# Patient Record
Sex: Female | Born: 1950 | Race: White | Hispanic: No | Smoking: Never smoker
Health system: Southern US, Community
[De-identification: ages and names within clinical notes are randomized; demographics above are authoritative.]

## PROBLEM LIST (undated history)

## (undated) DIAGNOSIS — D61810 Antineoplastic chemotherapy induced pancytopenia: Secondary | ICD-10-CM

## (undated) DIAGNOSIS — I10 Essential (primary) hypertension: Secondary | ICD-10-CM

## (undated) DIAGNOSIS — C50919 Malignant neoplasm of unspecified site of unspecified female breast: Secondary | ICD-10-CM

## (undated) DIAGNOSIS — F32A Depression, unspecified: Secondary | ICD-10-CM

## (undated) DIAGNOSIS — C801 Malignant (primary) neoplasm, unspecified: Secondary | ICD-10-CM

## (undated) DIAGNOSIS — M1990 Unspecified osteoarthritis, unspecified site: Secondary | ICD-10-CM

## (undated) DIAGNOSIS — F419 Anxiety disorder, unspecified: Secondary | ICD-10-CM

## (undated) DIAGNOSIS — I890 Lymphedema, not elsewhere classified: Secondary | ICD-10-CM

## (undated) DIAGNOSIS — T451X5A Adverse effect of antineoplastic and immunosuppressive drugs, initial encounter: Secondary | ICD-10-CM

## (undated) DIAGNOSIS — T7840XA Allergy, unspecified, initial encounter: Secondary | ICD-10-CM

## (undated) DIAGNOSIS — N6320 Unspecified lump in the left breast, unspecified quadrant: Secondary | ICD-10-CM

## (undated) DIAGNOSIS — F329 Major depressive disorder, single episode, unspecified: Secondary | ICD-10-CM

## (undated) DIAGNOSIS — E7800 Pure hypercholesterolemia, unspecified: Secondary | ICD-10-CM

## (undated) DIAGNOSIS — N6310 Unspecified lump in the right breast, unspecified quadrant: Secondary | ICD-10-CM

## (undated) HISTORY — DX: Malignant neoplasm of unspecified site of unspecified female breast: C50.919

## (undated) HISTORY — PX: TUBAL LIGATION: SHX77

## (undated) HISTORY — DX: Allergy, unspecified, initial encounter: T78.40XA

## (undated) HISTORY — DX: Unspecified osteoarthritis, unspecified site: M19.90

## (undated) HISTORY — DX: Anxiety disorder, unspecified: F41.9

## (undated) HISTORY — PX: TONSILLECTOMY: SUR1361

## (undated) HISTORY — PX: CHOLECYSTECTOMY: SHX55

---

## 2005-09-03 ENCOUNTER — Encounter: Payer: Self-pay | Admitting: Emergency Medicine

## 2005-09-03 ENCOUNTER — Emergency Department (HOSPITAL_COMMUNITY): Admission: EM | Admit: 2005-09-03 | Discharge: 2005-09-04 | Payer: Self-pay | Admitting: Emergency Medicine

## 2012-09-13 DIAGNOSIS — N6320 Unspecified lump in the left breast, unspecified quadrant: Secondary | ICD-10-CM

## 2012-09-13 DIAGNOSIS — N6310 Unspecified lump in the right breast, unspecified quadrant: Secondary | ICD-10-CM

## 2012-09-13 HISTORY — DX: Unspecified lump in the right breast, unspecified quadrant: N63.10

## 2012-09-13 HISTORY — DX: Unspecified lump in the left breast, unspecified quadrant: N63.20

## 2012-10-01 ENCOUNTER — Inpatient Hospital Stay (HOSPITAL_COMMUNITY)
Admission: EM | Admit: 2012-10-01 | Discharge: 2012-10-04 | DRG: 585 | Disposition: A | Discharge status: Home Health | Payer: Medicaid Other | Attending: General Surgery | Admitting: General Surgery

## 2012-10-01 ENCOUNTER — Encounter (HOSPITAL_COMMUNITY): Payer: Self-pay | Admitting: *Deleted

## 2012-10-01 DIAGNOSIS — Z23 Encounter for immunization: Secondary | ICD-10-CM

## 2012-10-01 DIAGNOSIS — N63 Unspecified lump in unspecified breast: Secondary | ICD-10-CM

## 2012-10-01 DIAGNOSIS — N289 Disorder of kidney and ureter, unspecified: Secondary | ICD-10-CM | POA: Diagnosis present

## 2012-10-01 DIAGNOSIS — L0390 Cellulitis, unspecified: Secondary | ICD-10-CM

## 2012-10-01 DIAGNOSIS — C50919 Malignant neoplasm of unspecified site of unspecified female breast: Principal | ICD-10-CM | POA: Diagnosis present

## 2012-10-01 DIAGNOSIS — N611 Abscess of the breast and nipple: Secondary | ICD-10-CM

## 2012-10-01 DIAGNOSIS — F329 Major depressive disorder, single episode, unspecified: Secondary | ICD-10-CM | POA: Diagnosis present

## 2012-10-01 DIAGNOSIS — R110 Nausea: Secondary | ICD-10-CM | POA: Diagnosis not present

## 2012-10-01 DIAGNOSIS — E785 Hyperlipidemia, unspecified: Secondary | ICD-10-CM | POA: Diagnosis present

## 2012-10-01 DIAGNOSIS — I10 Essential (primary) hypertension: Secondary | ICD-10-CM | POA: Diagnosis present

## 2012-10-01 DIAGNOSIS — F3289 Other specified depressive episodes: Secondary | ICD-10-CM | POA: Diagnosis present

## 2012-10-01 DIAGNOSIS — D473 Essential (hemorrhagic) thrombocythemia: Secondary | ICD-10-CM | POA: Diagnosis present

## 2012-10-01 DIAGNOSIS — D630 Anemia in neoplastic disease: Secondary | ICD-10-CM | POA: Diagnosis present

## 2012-10-01 HISTORY — DX: Essential (primary) hypertension: I10

## 2012-10-01 HISTORY — DX: Malignant (primary) neoplasm, unspecified: C80.1

## 2012-10-01 HISTORY — DX: Pure hypercholesterolemia, unspecified: E78.00

## 2012-10-01 LAB — BASIC METABOLIC PANEL
BUN: 9 mg/dL (ref 6–23)
CO2: 27 mEq/L (ref 19–32)
GFR calc non Af Amer: 47 mL/min — ABNORMAL LOW (ref >90)
Glucose, Bld: 107 mg/dL — ABNORMAL HIGH (ref 70–99)
Potassium: 3.1 mEq/L — ABNORMAL LOW (ref 3.5–5.1)

## 2012-10-01 LAB — CBC WITH DIFFERENTIAL/PLATELET
Eosinophils Absolute: 0.1 10*3/uL (ref 0.0–0.7)
Eosinophils Relative: 1 % (ref 0–5)
Hemoglobin: 11.8 g/dL — ABNORMAL LOW (ref 12.0–15.0)
Lymphocytes Relative: 11 % — ABNORMAL LOW (ref 12–46)
Lymphs Abs: 1.6 10*3/uL (ref 0.7–4.0)
MCH: 29.2 pg (ref 26.0–34.0)
MCV: 86.9 fL (ref 78.0–100.0)
Monocytes Relative: 8 % (ref 3–12)
Neutrophils Relative %: 80 % — ABNORMAL HIGH (ref 43–77)
RBC: 4.04 MIL/uL (ref 3.87–5.11)

## 2012-10-01 MED ORDER — LACTATED RINGERS IV SOLN
INTRAVENOUS | Status: DC
  Administered 2012-10-01: 16:00:00 via INTRAVENOUS

## 2012-10-01 MED ORDER — PIPERACILLIN-TAZOBACTAM 3.375 G IVPB 30 MIN: 3.3750 g | Freq: Once | INTRAVENOUS | Status: AC

## 2012-10-01 MED ORDER — HYDROCODONE-ACETAMINOPHEN 5-325 MG PO TABS
1.0000 | ORAL_TABLET | ORAL | Status: DC | PRN
  Administered 2012-10-01: 2 via ORAL
  Administered 2012-10-02 – 2012-10-03 (×2): 1 via ORAL
  Administered 2012-10-04 (×2): 2 via ORAL
  Filled 2012-10-01 (×2): qty 2
  Filled 2012-10-01: qty 1
  Filled 2012-10-01: qty 2
  Filled 2012-10-01: qty 1

## 2012-10-01 MED ORDER — INFLUENZA VIRUS VACC SPLIT PF IM SUSP
0.5000 mL | INTRAMUSCULAR | Status: AC
  Administered 2012-10-02: 0.5 mL via INTRAMUSCULAR
  Filled 2012-10-01: qty 0.5

## 2012-10-01 MED ORDER — PIPERACILLIN-TAZOBACTAM 3.375 G IVPB
3.3750 g | Freq: Three times a day (TID) | INTRAVENOUS | Status: DC
  Administered 2012-10-01 – 2012-10-04 (×9): 3.375 g via INTRAVENOUS
  Filled 2012-10-01 (×13): qty 50

## 2012-10-01 MED ORDER — PANTOPRAZOLE SODIUM 40 MG IV SOLR
40.0000 mg | Freq: Every day | INTRAVENOUS | Status: DC
  Administered 2012-10-01 – 2012-10-02 (×2): 40 mg via INTRAVENOUS
  Filled 2012-10-01 (×2): qty 40

## 2012-10-01 MED ORDER — ENOXAPARIN SODIUM 40 MG/0.4ML ~~LOC~~ SOLN
40.0000 mg | Freq: Every day | SUBCUTANEOUS | Status: DC
  Administered 2012-10-01 – 2012-10-04 (×4): 40 mg via SUBCUTANEOUS
  Filled 2012-10-01 (×4): qty 0.4

## 2012-10-01 MED ORDER — POTASSIUM CHLORIDE CRYS ER 20 MEQ PO TBCR
40.0000 meq | EXTENDED_RELEASE_TABLET | Freq: Once | ORAL | Status: AC
  Administered 2012-10-01: 40 meq via ORAL
  Filled 2012-10-01: qty 2

## 2012-10-01 MED ORDER — VANCOMYCIN HCL IN DEXTROSE 1-5 GM/200ML-% IV SOLN
1000.0000 mg | Freq: Once | INTRAVENOUS | Status: AC
  Administered 2012-10-01: 1000 mg via INTRAVENOUS
  Filled 2012-10-01: qty 200

## 2012-10-01 MED ORDER — MORPHINE SULFATE 4 MG/ML IJ SOLN
4.0000 mg | Freq: Once | INTRAMUSCULAR | Status: AC
  Administered 2012-10-01: 4 mg via INTRAVENOUS
  Filled 2012-10-01: qty 1

## 2012-10-01 MED ORDER — ONDANSETRON HCL 4 MG/2ML IJ SOLN
4.0000 mg | Freq: Four times a day (QID) | INTRAMUSCULAR | Status: DC | PRN
  Administered 2012-10-02: 4 mg via INTRAVENOUS
  Filled 2012-10-01: qty 2

## 2012-10-01 MED ORDER — HYDROMORPHONE HCL PF 1 MG/ML IJ SOLN
1.0000 mg | INTRAMUSCULAR | Status: DC | PRN
  Administered 2012-10-02 – 2012-10-03 (×3): 1 mg via INTRAVENOUS
  Filled 2012-10-01 (×3): qty 1

## 2012-10-01 NOTE — ED Notes (Signed)
Abscess to right breast x 4 months.  Reports drainage x 2 months.

## 2012-10-01 NOTE — H&P (Signed)
Erika Nunez is an 62 y.o. female.   Chief Complaint: Right breast abscess HPI: Patient presented to the emergency department with discomfort and drainage from the right breast. She states over the last 6 months she has noted a change within the breast. Over the last 6 weeks this is increased in she's had increased drainage and odor. She is being clean the area with peroxide and soap and water at least daily over this period of time. He has continued to progress. She denies any significant fevers or chills. She has had approximately 10 pound weight loss over the last month to 2. She states she had a previous cyst in this area several years ago which resolved spontaneously. She didn't notice another cyst in the similar area. This nodule did increase in size somewhat. She had no biopsy previously. No history of mammogram or ultrasound. No family history of breast cancer. No ovarian or uterine cancer in the family.  Past Medical History  Diagnosis Date  . Hypertension   . High cholesterol     Past Surgical History  Procedure Date  . Cholecystectomy   . Tubal ligation   . Tonsillectomy     No family history on file. Social History:  reports that she has never smoked. She does not have any smokeless tobacco history on file. She reports that she does not drink alcohol or use illicit drugs.  Allergies:  Allergies  Allergen Reactions  . Codeine Hives    Medications Prior to Admission  Medication Sig Dispense Refill  . ibuprofen (ADVIL,MOTRIN) 200 MG tablet Take 200 mg by mouth every 6 (six) hours as needed. Pain.      . lisinopril (PRINIVIL,ZESTRIL) 20 MG tablet Take 20 mg by mouth daily.      . lisinopril-hydrochlorothiazide (PRINZIDE,ZESTORETIC) 20-25 MG per tablet Take 1 tablet by mouth daily.      . pravastatin (PRAVACHOL) 40 MG tablet Take 80 mg by mouth at bedtime.      . sertraline (ZOLOFT) 100 MG tablet Take 150 mg by mouth daily.        Results for orders placed during the  hospital encounter of 10/01/12 (from the past 48 hour(s))  CBC WITH DIFFERENTIAL     Status: Abnormal   Collection Time   10/01/12 11:07 AM      Component Value Range Comment   WBC 14.0 (*) 4.0 - 10.5 K/uL    RBC 4.04  3.87 - 5.11 MIL/uL    Hemoglobin 11.8 (*) 12.0 - 15.0 g/dL    HCT 35.1 (*) 36.0 - 46.0 %    MCV 86.9  78.0 - 100.0 fL    MCH 29.2  26.0 - 34.0 pg    MCHC 33.6  30.0 - 36.0 g/dL    RDW 12.9  11.5 - 15.5 %    Platelets 565 (*) 150 - 400 K/uL    Neutrophils Relative 80 (*) 43 - 77 %    Neutro Abs 11.1 (*) 1.7 - 7.7 K/uL    Lymphocytes Relative 11 (*) 12 - 46 %    Lymphs Abs 1.6  0.7 - 4.0 K/uL    Monocytes Relative 8  3 - 12 %    Monocytes Absolute 1.1 (*) 0.1 - 1.0 K/uL    Eosinophils Relative 1  0 - 5 %    Eosinophils Absolute 0.1  0.0 - 0.7 K/uL    Basophils Relative 0  0 - 1 %    Basophils Absolute 0.0  0.0 -   0.1 K/uL   BASIC METABOLIC PANEL     Status: Abnormal   Collection Time   10/01/12 11:07 AM      Component Value Range Comment   Sodium 131 (*) 135 - 145 mEq/L    Potassium 3.1 (*) 3.5 - 5.1 mEq/L    Chloride 92 (*) 96 - 112 mEq/L    CO2 27  19 - 32 mEq/L    Glucose, Bld 107 (*) 70 - 99 mg/dL    BUN 9  6 - 23 mg/dL    Creatinine, Ser 1.21 (*) 0.50 - 1.10 mg/dL    Calcium 9.9  8.4 - 10.5 mg/dL    GFR calc non Af Amer 47 (*) >90 mL/min    GFR calc Af Amer 54 (*) >90 mL/min    No results found.  Review of Systems  Constitutional: Positive for weight loss. Negative for fever and chills.  HENT: Negative.   Eyes: Negative.   Respiratory: Negative.   Cardiovascular: Positive for chest pain.  Gastrointestinal: Negative.   Genitourinary: Negative.   Musculoskeletal: Negative.   Skin:       Drainage for the last 6 weeks in right breast. Redness in thickness.  Neurological: Negative.  Negative for weakness.  Endo/Heme/Allergies: Negative.   Psychiatric/Behavioral: Negative.     Blood pressure 113/58, pulse 88, temperature 97.8 F (36.6 C), temperature  source Oral, resp. rate 20, height 5' 8" (1.727 m), weight 68.04 kg (150 lb), SpO2 98.00%. Physical Exam  Constitutional: She is oriented to person, place, and time. She appears well-developed and well-nourished. No distress.  HENT:  Head: Normocephalic and atraumatic.  Eyes: Conjunctivae normal and EOM are normal. Pupils are equal, round, and reactive to light.  Neck: Normal range of motion. Neck supple. No tracheal deviation present. No thyromegaly present.  Cardiovascular: Normal rate, regular rhythm and normal heart sounds.   Respiratory: Effort normal and breath sounds normal.  GI: Soft. Bowel sounds are normal. She exhibits no distension. There is no tenderness.  Musculoskeletal: Normal range of motion.  Lymphadenopathy:    She has no cervical adenopathy.  Neurological: She is alert and oriented to person, place, and time.  Skin:       Right breast: The majority of the inferior and lateral breast is thickened. There is necrosis with sloughing along the lower outer quadrant. There is significant discharge. There is significant odor.     Assessment/Plan Necrotic breast mass of the right breast. I am significantly concerned regarding the findings. 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 Have discussed with the patient attempting to biopsy this tomorrow in the operating room in attempt to clean some necrotic tissue. Oncology consult will also be obtained in order to expedite any neoadjuvant treatment. Continue IV antibiotics. Continue local wound care.  Jin Capote C 10/01/2012, 2:32 PM    

## 2012-10-01 NOTE — ED Provider Notes (Signed)
History  This chart was scribed for Joya Gaskins, MD by Erskine Emery, ED Scribe. This patient was seen in room APA02/APA02 and the patient's care was started at 11:31.   CSN: [id number]  Arrival date & time 10/01/12  1022   First MD Initiated Contact with Patient 10/01/12 1131      Chief Complaint  Patient presents with  . Abscess   The history is provided by the patient. No language interpreter was used.  Erika Nunez is a 62 y.o. female who presents to the Emergency Department complaining of an abscess to the right breast for the past 4 months that has been draining for the past 2 months. Pt reports the area has been numb since it started draining. Pt reports some associated fever, emesis, and coughing but denies any abdominal pain, swelling in the legs, or any injuries to the area. Pt has not seen any physician for this complaint. She has no other medical problems. She reports a h/o tonsillectomy, tubal ligation, and cholecystectomy, but none is the last several years. Pt is only allergic to Codeine. She denies recent mammogram No known history of breast cancer  [facility] is the pt's PCP   Past Medical History  Diagnosis Date  . Hypertension   . High cholesterol     Past Surgical History  Procedure Date  . Cholecystectomy   . Tubal ligation   . Tonsillectomy     No family history on file.  History  Substance Use Topics  . Smoking status: Never Smoker   . Smokeless tobacco: Not on file  . Alcohol Use: No    OB History    Grav Para Term Preterm Abortions TAB SAB Ect Mult Living                  Review of Systems  Constitutional: Positive for fever.  Respiratory: Positive for cough.   Gastrointestinal: Positive for nausea and vomiting. Negative for abdominal pain.  Musculoskeletal: Negative for joint swelling.  Skin:       Draining right breast abscess.  All other systems reviewed and are negative.    Allergies  Codeine  Home  Medications   Current Outpatient Rx  Name  Route  Sig  Dispense  Refill  . IBUPROFEN 200 MG PO TABS   Oral   Take 200 mg by mouth every 6 (six) hours as needed. Pain.         Marland Kitchen LISINOPRIL 20 MG PO TABS   Oral   Take 20 mg by mouth daily.         Marland Kitchen LISINOPRIL-HYDROCHLOROTHIAZIDE 20-25 MG PO TABS   Oral   Take 1 tablet by mouth daily.         Marland Kitchen PRAVASTATIN SODIUM 40 MG PO TABS   Oral   Take 80 mg by mouth at bedtime.         . SERTRALINE HCL 100 MG PO TABS   Oral   Take 150 mg by mouth daily.           Triage Vitals: BP 139/77  Pulse 104  Temp 98.3 F (36.8 C) (Oral)  Resp 20  Ht 5\' 8"  (1.727 m)  Wt 150 lb (68.04 kg)  BMI 22.81 kg/m2  SpO2 100%  Physical Exam CONSTITUTIONAL: Well developed/well nourished HEAD AND FACE: Normocephalic/atraumatic EYES: EOMI/PERRL ENMT: Mucous membranes moist NECK: supple no meningeal signs SPINE:entire spine nontender CV: S1/S2 noted, no murmurs/rubs/gallops noted LUNGS: Lungs are clear to auscultation  bilaterally, no apparent distress ABDOMEN: soft, nontender, no rebound or guarding GU:no cva tenderness NEURO: Pt is awake/alert, moves all extremitiesx4 EXTREMITIES: pulses normal, full ROM SKIN: warm, color normal, right breast: entire breast is covered with large fluctuance abscess with some necrotic tissue, with foul smelling drainage, no crepitous noted. Chaperone present.  PSYCH: no abnormalities of mood noted   ED Course  Procedures  DIAGNOSTIC STUDIES: Oxygen Saturation is 100% on room air, normal by my interpretation.    COORDINATION OF CARE: 11:52--I evaluated the patient and we discussed a treatment plan including IV antibiotics, pain medication, possible admission, and consult with surgery to which the pt agreed. I instructed the pt not to eat or drink anything right now.  12:05--Consult with surgeon on call to have the pt admitted.  Dr zieglar requests zosyn and will admit for likely debridement tomorrow.   Pt is not septic appearing.  No distress noted but will need surgical management  12:10--I rechecked the pt and told her she is being admitted.   Labs Reviewed  CBC WITH DIFFERENTIAL - Abnormal; Notable for the following:    WBC 14.0 (*)     Hemoglobin 11.8 (*)     HCT 35.1 (*)     Platelets 565 (*)     Neutrophils Relative 80 (*)     Neutro Abs 11.1 (*)     Lymphocytes Relative 11 (*)     Monocytes Absolute 1.1 (*)     All other components within normal limits  BASIC METABOLIC PANEL     MDM  Nursing notes including past medical history and social history reviewed and considered in documentation Labs/vital reviewed and considered        I personally performed the services described in this documentation, which was scribed in my presence. The recorded information has been reviewed and is accurate.      Joya Gaskins, MD 10/01/12 1226

## 2012-10-02 ENCOUNTER — Encounter (HOSPITAL_COMMUNITY)
Admission: EM | Disposition: A | Discharge status: Home Health | Payer: Self-pay | Source: Home / Self Care | Attending: General Surgery

## 2012-10-02 ENCOUNTER — Encounter (HOSPITAL_COMMUNITY): Payer: Self-pay | Admitting: Anesthesiology

## 2012-10-02 ENCOUNTER — Encounter (HOSPITAL_COMMUNITY): Payer: Self-pay | Admitting: *Deleted

## 2012-10-02 ENCOUNTER — Inpatient Hospital Stay (HOSPITAL_COMMUNITY): Payer: Medicaid Other

## 2012-10-02 ENCOUNTER — Inpatient Hospital Stay (HOSPITAL_COMMUNITY): Payer: Medicaid Other | Admitting: Anesthesiology

## 2012-10-02 DIAGNOSIS — N61 Mastitis without abscess: Secondary | ICD-10-CM

## 2012-10-02 HISTORY — PX: BREAST BIOPSY: SHX20

## 2012-10-02 LAB — CBC
HCT: 32.5 % — ABNORMAL LOW (ref 36.0–46.0)
MCHC: 33.5 g/dL (ref 30.0–36.0)
MCV: 87.4 fL (ref 78.0–100.0)
RDW: 13.1 % (ref 11.5–15.5)

## 2012-10-02 LAB — BASIC METABOLIC PANEL
BUN: 12 mg/dL (ref 6–23)
CO2: 28 mEq/L (ref 19–32)
Calcium: 9.5 mg/dL (ref 8.4–10.5)
Chloride: 96 mEq/L (ref 96–112)
Creatinine, Ser: 1.29 mg/dL — ABNORMAL HIGH (ref 0.50–1.10)
GFR calc Af Amer: 50 mL/min — ABNORMAL LOW (ref >90)

## 2012-10-02 SURGERY — BREAST BIOPSY
Anesthesia: General | Site: Breast | Laterality: Right | Wound class: Dirty or Infected

## 2012-10-02 MED ORDER — MUPIROCIN CALCIUM 2 % EX CREA: TOPICAL_CREAM | Freq: Once | CUTANEOUS | Status: DC

## 2012-10-02 MED ORDER — ONDANSETRON HCL 4 MG/2ML IJ SOLN
INTRAMUSCULAR | Status: AC
  Filled 2012-10-02: qty 2

## 2012-10-02 MED ORDER — LACTATED RINGERS IV SOLN
INTRAVENOUS | Status: DC
  Administered 2012-10-02: 1000 mL via INTRAVENOUS

## 2012-10-02 MED ORDER — LISINOPRIL 10 MG PO TABS
20.0000 mg | ORAL_TABLET | Freq: Every day | ORAL | Status: DC
  Administered 2012-10-02 – 2012-10-04 (×3): 20 mg via ORAL
  Filled 2012-10-02 (×3): qty 2

## 2012-10-02 MED ORDER — FENTANYL CITRATE 0.05 MG/ML IJ SOLN
INTRAMUSCULAR | Status: AC
  Filled 2012-10-02: qty 2

## 2012-10-02 MED ORDER — FENTANYL CITRATE 0.05 MG/ML IJ SOLN: 25.0000 ug | INTRAMUSCULAR | Status: DC | PRN

## 2012-10-02 MED ORDER — ONDANSETRON HCL 4 MG/2ML IJ SOLN
4.0000 mg | Freq: Once | INTRAMUSCULAR | Status: AC
  Administered 2012-10-02: 4 mg via INTRAVENOUS

## 2012-10-02 MED ORDER — PROPOFOL 10 MG/ML IV EMUL
INTRAVENOUS | Status: AC
  Filled 2012-10-02: qty 20

## 2012-10-02 MED ORDER — SIMVASTATIN 20 MG PO TABS
20.0000 mg | ORAL_TABLET | Freq: Every day | ORAL | Status: DC
  Administered 2012-10-02 – 2012-10-03 (×2): 20 mg via ORAL
  Filled 2012-10-02 (×2): qty 1

## 2012-10-02 MED ORDER — LIDOCAINE HCL (CARDIAC) 10 MG/ML IV SOLN
INTRAVENOUS | Status: DC | PRN
  Administered 2012-10-02: 20 mg via INTRAVENOUS

## 2012-10-02 MED ORDER — LIDOCAINE HCL (PF) 1 % IJ SOLN
INTRAMUSCULAR | Status: AC
  Filled 2012-10-02: qty 5

## 2012-10-02 MED ORDER — IOHEXOL 300 MG/ML  SOLN
100.0000 mL | Freq: Once | INTRAMUSCULAR | Status: AC | PRN
  Administered 2012-10-02: 80 mL via INTRAVENOUS

## 2012-10-02 MED ORDER — SERTRALINE HCL 50 MG PO TABS
150.0000 mg | ORAL_TABLET | Freq: Every day | ORAL | Status: DC
Start: 2012-10-02 — End: 2012-10-04
  Administered 2012-10-02 – 2012-10-04 (×3): 150 mg via ORAL
  Filled 2012-10-02 (×3): qty 3

## 2012-10-02 MED ORDER — PROPOFOL 10 MG/ML IV BOLUS
INTRAVENOUS | Status: DC | PRN
  Administered 2012-10-02: 130 mg via INTRAVENOUS
  Administered 2012-10-02: 20 mg via INTRAVENOUS

## 2012-10-02 MED ORDER — FENTANYL CITRATE 0.05 MG/ML IJ SOLN
INTRAMUSCULAR | Status: DC | PRN
  Administered 2012-10-02 (×2): 50 ug via INTRAVENOUS

## 2012-10-02 MED ORDER — CELECOXIB 100 MG PO CAPS
200.0000 mg | ORAL_CAPSULE | Freq: Two times a day (BID) | ORAL | Status: DC
  Administered 2012-10-02 – 2012-10-04 (×4): 200 mg via ORAL
  Filled 2012-10-02 (×4): qty 2

## 2012-10-02 MED ORDER — IOHEXOL 300 MG/ML  SOLN
50.0000 mL | Freq: Once | INTRAMUSCULAR | Status: AC | PRN
  Administered 2012-10-02: 50 mL via INTRAVENOUS

## 2012-10-02 MED ORDER — MIDAZOLAM HCL 2 MG/2ML IJ SOLN
1.0000 mg | INTRAMUSCULAR | Status: DC | PRN
  Administered 2012-10-02: 2 mg via INTRAVENOUS

## 2012-10-02 MED ORDER — ONDANSETRON HCL 4 MG/2ML IJ SOLN: 4.0000 mg | Freq: Once | INTRAMUSCULAR | Status: DC | PRN

## 2012-10-02 MED ORDER — MUPIROCIN 2 % EX OINT
TOPICAL_OINTMENT | CUTANEOUS | Status: AC
  Filled 2012-10-02: qty 22

## 2012-10-02 MED ORDER — MIDAZOLAM HCL 2 MG/2ML IJ SOLN
INTRAMUSCULAR | Status: AC
  Filled 2012-10-02: qty 2

## 2012-10-02 MED ORDER — MUPIROCIN 2 % EX OINT
TOPICAL_OINTMENT | Freq: Two times a day (BID) | CUTANEOUS | Status: DC
  Administered 2012-10-02: 1 via NASAL

## 2012-10-02 MED ORDER — 0.9 % SODIUM CHLORIDE (POUR BTL) OPTIME
TOPICAL | Status: DC | PRN
  Administered 2012-10-02: 1000 mL

## 2012-10-02 SURGICAL SUPPLY — 32 items
BAG HAMPER (MISCELLANEOUS) ×2 IMPLANT
BANDAGE CONFORM 3  STR LF (GAUZE/BANDAGES/DRESSINGS) ×2 IMPLANT
BENZOIN TINCTURE PRP APPL 2/3 (GAUZE/BANDAGES/DRESSINGS) ×2 IMPLANT
CLOTH BEACON ORANGE TIMEOUT ST (SAFETY) ×2 IMPLANT
COVER LIGHT HANDLE STERIS (MISCELLANEOUS) ×4 IMPLANT
DURAPREP 26ML APPLICATOR (WOUND CARE) ×2 IMPLANT
ELECT REM PT RETURN 9FT ADLT (ELECTROSURGICAL) ×2
ELECTRODE REM PT RTRN 9FT ADLT (ELECTROSURGICAL) ×1 IMPLANT
FORMALIN 10 PREFIL 120ML (MISCELLANEOUS) ×2 IMPLANT
GLOVE BIOGEL PI IND STRL 7.5 (GLOVE) ×2 IMPLANT
GLOVE BIOGEL PI INDICATOR 7.5 (GLOVE) ×2
GLOVE ECLIPSE 6.5 STRL STRAW (GLOVE) ×2 IMPLANT
GLOVE ECLIPSE 7.0 STRL STRAW (GLOVE) ×4 IMPLANT
GOWN STRL REIN XL XLG (GOWN DISPOSABLE) ×4 IMPLANT
KIT ROOM TURNOVER APOR (KITS) ×2 IMPLANT
MANIFOLD NEPTUNE II (INSTRUMENTS) ×2 IMPLANT
NEEDLE HYPO 18GX1.5 BLUNT FILL (NEEDLE) IMPLANT
NEEDLE HYPO 25X1 1.5 SAFETY (NEEDLE) ×2 IMPLANT
NS IRRIG 1000ML POUR BTL (IV SOLUTION) ×2 IMPLANT
PACK MINOR (CUSTOM PROCEDURE TRAY) ×2 IMPLANT
PAD ABD 5X9 TENDERSORB (GAUZE/BANDAGES/DRESSINGS) ×2 IMPLANT
PAD ARMBOARD 7.5X6 YLW CONV (MISCELLANEOUS) ×2 IMPLANT
SET BASIN LINEN APH (SET/KITS/TRAYS/PACK) ×2 IMPLANT
SPONGE GAUZE 2X2 8PLY STRL LF (GAUZE/BANDAGES/DRESSINGS) IMPLANT
SPONGE GAUZE 4X4 12PLY (GAUZE/BANDAGES/DRESSINGS) ×2 IMPLANT
STRIP CLOSURE SKIN 1/2X4 (GAUZE/BANDAGES/DRESSINGS) ×2 IMPLANT
SUT MNCRL AB 4-0 PS2 18 (SUTURE) ×2 IMPLANT
SUT VIC AB 3-0 SH 27 (SUTURE)
SUT VIC AB 3-0 SH 27X BRD (SUTURE) IMPLANT
SYR BULB IRRIGATION 50ML (SYRINGE) ×2 IMPLANT
SYR CONTROL 10ML LL (SYRINGE) ×2 IMPLANT
TAPE CLOTH SURG 4X10 WHT LF (GAUZE/BANDAGES/DRESSINGS) ×2 IMPLANT

## 2012-10-02 NOTE — Progress Notes (Signed)
  Subjective: No significant change. No questions concerns.  Objective: Vital signs in last 24 hours: Temp:  [97.7 F (36.5 C)-97.9 F (36.6 C)] 97.9 F (36.6 C) (01/20 0519) Pulse Rate:  [77-88] 77  (01/20 0519) Resp:  [20] 20  (01/20 0519) BP: (98-113)/(58-69) 98/66 mmHg (01/20 0519) SpO2:  [98 %-99 %] 99 % (01/20 0519) Last BM Date: 09/30/12  Intake/Output from previous day: 01/19 0701 - 01/20 0700 In: 1225.3 [I.V.:1123.3; IV Piggyback:102] Out: -  Intake/Output this shift:    General appearance: alert and no distress Resp: clear to auscultation bilaterally Breasts: Right-sided thickening, necrosis, discharge Cardio: regular rate and rhythm  Lab Results:   Basename 10/02/12 0355 10/01/12 1107  WBC 13.4* 14.0*  HGB 10.9* 11.8*  HCT 32.5* 35.1*  PLT 504* 565*   BMET  Basename 10/02/12 0355 10/01/12 1107  NA 136 131*  K 3.8 3.1*  CL 96 92*  CO2 28 27  GLUCOSE 108* 107*  BUN 12 9  CREATININE 1.29* 1.21*  CALCIUM 9.5 9.9   PT/INR No results found for this basename: LABPROT:2,INR:2 in the last 72 hours ABG No results found for this basename: PHART:2,PCO2:2,PO2:2,HCO3:2 in the last 72 hours  Studies/Results: No results found.  Anti-infectives: Anti-infectives     Start     Dose/Rate Route Frequency Ordered Stop   10/01/12 1430   piperacillin-tazobactam (ZOSYN) IVPB 3.375 g        3.375 g 12.5 mL/hr over 240 Minutes Intravenous 3 times per day 10/01/12 1430     10/01/12 1215   piperacillin-tazobactam (ZOSYN) IVPB 3.375 g        3.375 g 100 mL/hr over 30 Minutes Intravenous  Once 10/01/12 1202 10/01/12 1517   10/01/12 1200   vancomycin (VANCOCIN) IVPB 1000 mg/200 mL premix        1,000 mg 200 mL/hr over 60 Minutes Intravenous  Once 10/01/12 1155 10/01/12 1316          Assessment/Plan: s/p Procedure(s) (LRB) with comments: BREAST BIOPSY (Right) Right breast mass with necrosis and discharged, and inflammatory changes. We'll proceed to the  operating room for cleanup of necrotic tissue and biopsy.benefits alternatives again discussed with patient.  LOS: 1 day    Blair Lundeen C 10/02/2012

## 2012-10-02 NOTE — Transfer of Care (Signed)
Immediate Anesthesia Transfer of Care Note  Patient: Erika Nunez  Procedure(s) Performed: Procedure(s) (LRB): BREAST BIOPSY (Right)  Patient Location: PACU  Anesthesia Type: General  Level of Consciousness: awake  Airway & Oxygen Therapy: Patient Spontanous Breathing   Post-op Assessment: Report given to PACU RN, Post -op Vital signs reviewed and stable and Patient moving all extremities  Post vital signs: Reviewed and stable  Complications: No apparent anesthesia complications

## 2012-10-02 NOTE — Anesthesia Postprocedure Evaluation (Addendum)
Anesthesia Post Note  Patient: Erika Nunez  Procedure(s) Performed: Procedure(s) (LRB): BREAST BIOPSY (Right)  Anesthesia type: General  Patient location: PACU  Post pain: Pain level controlled  Post assessment: Post-op Vital signs reviewed, Patient's Cardiovascular Status Stable, Respiratory Function Stable, Patent Airway, No signs of Nausea or vomiting and Pain level controlled  Last Vitals:  Filed Vitals:   10/02/12 1343  BP: 135/60  Pulse: 93  Temp: 36.7 C  Resp: 18    Post vital signs: Reviewed and stable  Level of consciousness: awake and alert   Complications: No apparent anesthesia complications  10/03/12  Patient doing well, no apparent anesthesia complications.

## 2012-10-02 NOTE — Care Management Note (Signed)
    Page 1 of 2   10/04/2012     11:28:39 AM   CARE MANAGEMENT NOTE 10/04/2012  Patient:  Erika Nunez, Erika Nunez   Account Number:  [id number]  Date Initiated:  10/02/2012  Documentation initiated by:  Sharrie Rothman  Subjective/Objective Assessment:   Pt admitted from home with cellulitis of breast. Pt has a grandson who live with her. Pt is independent with ADL's and will return home at discharge.     Action/Plan:   Financial counselor consulted for self pay status. Will continue to monitor pt for [facility] or CM needs.   Anticipated DC Date:  10/04/2012   Anticipated DC Plan:  HOME/SELF CARE  In-house referral  Financial Counselor      DC Planning Services  CM consult      [facility] Choice  HOME HEALTH   Choice offered to / List presented to:  C-1 Patient        HH arranged  HH-1 RN      [facility] agency  [facility]   Status of service:  Completed, signed off Medicare Important Message given?   (If response is "NO", the following Medicare IM given date fields will be blank) Date Medicare IM given:   Date Additional Medicare IM given:    Discharge Disposition:  HOME W HOME HEALTH SERVICES  Per UR Regulation:    If discussed at Long Length of Stay Meetings, dates discussed:    Comments:  10/04/12 1127 Arlyss Queen, RN BSN CM Pt discharged home today with [facility]. Alroy Bailiff of [facility] is aware and will collect the pts information from the chart. No DME needs noted. Pt and pts nurse aware of discharge arrangement.  10/02/12 1122 Arlyss Queen, RN BSN CM

## 2012-10-02 NOTE — Consult Note (Signed)
[facility] Consultation Oncology  Name: Erika Nunez      MRN: [id number]    Location: A311/A311-01  Date: 10/02/2012 Time:5:04 PM   REFERRING PHYSICIAN:  Tilford Pillar  REASON FOR CONSULT:  Breast mass  HISTORY OF PRESENT ILLNESS:   This is a 62 year old Caucasian woman who presented to the ED on 1/19 with a "breast abscess" on the right causing right breast numbness and draining with a past medical history significant for HTN, hyperlipidemia, and potentially renal insufficiency according to labs here in the hospital.  She reports that approximately one year ago, she noticed a lump in her right breast.  It progressed to areolar changes and then broke through the skin about 2 months ago.  She reports that over the past 3-4 years, her husband was ill.  Unfortunately, about 1 year ago, her husband passed from gastric cancer which he was getting treated for at the [facility] in /[place].  She reports that she was hoping this would go away on its own.  She admits that she hid these breast changes from her family and particularly her daughter, Jasmine December, who is an Charity fundraiser.  Sh admits that the breast started to drain fluid about 2 months ago and around that timeframe her right breast became numb.  The discharge is malodorous.   She admits that she had cysts in her breast before (both) that resolved on their own.  She denies seeing any medical provider about this breast change over the past year.  She has never had a mammogram.  She denies any headaches, dizziness. Double vision, fevers, chills, night sweats, nausea, vomiting, diarrhea, constipation, abdominal pain, bone pain, chest pain, heart palpitations, blood in stool, black tarry stool, urinary pain/burning/frequency, hematuria.  PAST MEDICAL HISTORY:   Past Medical History  Diagnosis Date  . Hypertension   . High cholesterol   Depression  ALLERGIES: Allergies  Allergen Reactions  . Codeine Hives      MEDICATIONS:  I have reviewed the patient's current medications.     PAST SURGICAL HISTORY Past Surgical History  Procedure Date  . Cholecystectomy   . Tubal ligation   . Tonsillectomy     FAMILY HISTORY: Mother deceased at age 56 from heart disease Father deceased at age 45 from complications of surgical intervention for a congental cardiac valve problem. Daughter 75, Jasmine December, RN, healthy Daughter 29, healthy Son 44 healthy Sone, 34, healthy in the [employer] working as a Psychologist, occupational for [employer]. 4 grandchildren. She is raising the son of her 68 year old son who is in the [employer].    SOCIAL HISTORY: Patient is from [place], [place] originally.  She graduated high school.  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  She lives by herself and her grandson lives with her.  She is widowed and was married for 42 years at the time of her husband's death.  Her husband worked in [employer]. She denies any EtOH, tobacco, or illicit drug abuse.    GYN HISTORY: Menarche at the age of 47 Menopause age 89 G4 P4 Breast fed her youngest son only Never had a mammogram nor breast biopsy in past.   PERFORMANCE STATUS: The patient's performance status is 1 - Symptomatic but completely ambulatory  PHYSICAL EXAM: Most Recent Vital Signs: Blood pressure 135/79, pulse 97, temperature 98 F (36.7 C), temperature source Oral, resp. rate 20, height 5\' 8"  (1.727 m), weight 150 lb (68.04 kg), SpO2 97.00%.  General appearance: alert, cooperative, appears older than stated age, no distress, moderately obese and daughter and son at bedside Head: Normocephalic, without obvious abnormality, atraumatic Back: symmetric, no curvature. ROM normal. No CVA tenderness. Lungs: clear to auscultation bilaterally and normal percussion bilaterally Breasts: positive findings: right breast is erythematous with open wound and discharge. Malodorous.  Significantly sized mass measuring nearly 16 cm cranio-caudally and 16 cm  laterally.  Heart: regular rate and rhythm, S1, S2 normal, no murmur, click, rub or gallop Abdomen: soft, non-tender; bowel sounds normal; no masses,  no organomegaly Extremities: extremities normal, atraumatic, no cyanosis or edema Skin: Skin color, texture, turgor normal. No rashes or lesions Neurologic: Grossly normal  LABORATORY DATA:  Results for orders placed during the hospital encounter of 10/01/12 (from the past 48 hour(s))  CBC WITH DIFFERENTIAL     Status: Abnormal   Collection Time   10/01/12 11:07 AM      Component Value Range Comment   WBC 14.0 (*) 4.0 - 10.5 K/uL    RBC 4.04  3.87 - 5.11 MIL/uL    Hemoglobin 11.8 (*) 12.0 - 15.0 g/dL    HCT 16.1 (*) 09.6 - 46.0 %    MCV 86.9  78.0 - 100.0 fL    MCH 29.2  26.0 - 34.0 pg    MCHC 33.6  30.0 - 36.0 g/dL    RDW 04.5  40.9 - 81.1 %    Platelets 565 (*) 150 - 400 K/uL    Neutrophils Relative 80 (*) 43 - 77 %    Neutro Abs 11.1 (*) 1.7 - 7.7 K/uL    Lymphocytes Relative 11 (*) 12 - 46 %    Lymphs Abs 1.6  0.7 - 4.0 K/uL    Monocytes Relative 8  3 - 12 %    Monocytes Absolute 1.1 (*) 0.1 - 1.0 K/uL    Eosinophils Relative 1  0 - 5 %    Eosinophils Absolute 0.1  0.0 - 0.7 K/uL    Basophils Relative 0  0 - 1 %    Basophils Absolute 0.0  0.0 - 0.1 K/uL   BASIC METABOLIC PANEL     Status: Abnormal   Collection Time   10/01/12 11:07 AM      Component Value Range Comment   Sodium 131 (*) 135 - 145 mEq/L    Potassium 3.1 (*) 3.5 - 5.1 mEq/L    Chloride 92 (*) 96 - 112 mEq/L    CO2 27  19 - 32 mEq/L    Glucose, Bld 107 (*) 70 - 99 mg/dL    BUN 9  6 - 23 mg/dL    Creatinine, Ser 9.14 (*) 0.50 - 1.10 mg/dL    Calcium 9.9  8.4 - 78.2 mg/dL    GFR calc non Af Amer 47 (*) >90 mL/min    GFR calc Af Amer 54 (*) >90 mL/min   BASIC METABOLIC PANEL     Status: Abnormal   Collection Time   10/02/12  3:55 AM      Component Value Range Comment   Sodium 136  135 - 145 mEq/L    Potassium 3.8  3.5 - 5.1 mEq/L DELTA CHECK NOTED    Chloride 96  96 - 112 mEq/L    CO2 28  19 - 32 mEq/L    Glucose, Bld 108 (*) 70 - 99 mg/dL    BUN 12  6 - 23 mg/dL    Creatinine, Ser 9.56 (*) 0.50 - 1.10 mg/dL  Calcium 9.5  8.4 - 10.5 mg/dL    GFR calc non Af Amer 43 (*) >90 mL/min    GFR calc Af Amer 50 (*) >90 mL/min   CBC     Status: Abnormal   Collection Time   10/02/12  3:55 AM      Component Value Range Comment   WBC 13.4 (*) 4.0 - 10.5 K/uL    RBC 3.72 (*) 3.87 - 5.11 MIL/uL    Hemoglobin 10.9 (*) 12.0 - 15.0 g/dL    HCT 54.0 (*) 98.1 - 46.0 %    MCV 87.4  78.0 - 100.0 fL    MCH 29.3  26.0 - 34.0 pg    MCHC 33.5  30.0 - 36.0 g/dL    RDW 19.1  47.8 - 29.5 %    Platelets 504 (*) 150 - 400 K/uL   SURGICAL PCR SCREEN     Status: Normal   Collection Time   10/02/12 10:28 AM      Component Value Range Comment   MRSA, PCR NEGATIVE  NEGATIVE    Staphylococcus aureus NEGATIVE  NEGATIVE       RADIOGRAPHY: No results found.     PATHOLOGY:  Pending   ASSESSMENT:  1. Large right breast mass, broken through skin with malodorous discharge, nicely dressed. 2. Renal insufficiency 3. HTN 4. Hyperlipidemia 5. 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   PLAN:  1. Chart reviewed 2. Pathology pending. 3. Patient is not a mastectomy candidate due to size of mass per Dr. Leticia Penna. 4. Will order CA 27.29 for tomorrow AM in addition to CBC diff, CMET 5. Patient needs full staging work-up: CT CAP with contrast and bone scan. 6. Will await pathology results 7. Will follow as an inpatient.   All questions were answered. The patient knows to call the clinic with any problems, questions or concerns. We can certainly see the patient much sooner if necessary.  The patient and plan discussed with Glenford Peers, MD and he is in agreement with the aforementioned.  KEFALAS,THOMAS   Her Right breast is fixed to the chest wall. The mass appears contiguous with mass in R axilla c/w nodal involvement. Left breast  has 4x5 cm mass centrally/upper outer quadrant also c/w cancer of breast. She may also have bone mets upon review of CT scans.  Await bone scan and path report.

## 2012-10-02 NOTE — Anesthesia Procedure Notes (Addendum)
Procedure Name: LMA Insertion Date/Time: 10/02/2012 1:03 PM Performed by: Franco Nones Pre-anesthesia Checklist: Patient identified, Patient being monitored, Emergency Drugs available, Timeout performed and Suction available Patient Re-evaluated:Patient Re-evaluated prior to inductionOxygen Delivery Method: Circle System Utilized Preoxygenation: Pre-oxygenation with 100% oxygen Intubation Type: IV induction Ventilation: Mask ventilation without difficulty LMA: LMA inserted LMA Size: 3.0 Number of attempts: 1 Placement Confirmation: positive ETCO2 and breath sounds checked- equal and bilateral

## 2012-10-02 NOTE — Op Note (Signed)
Patient:  Erika Nunez  DOB:  [birth year]  MRN:  [id number]   Preop Diagnosis:  Right breast mass with necrosis  Postop Diagnosis:  The same  Procedure:  Incisional biopsy and debridement of necrotic tissue  Surgeon:  Dr. Tilford Pillar  Anes:  General endotracheal  Indications:  Patient is a 62 year old female presented to [facility] with what she thought was a breast abscess. This is been worsening over the last 6 months. The last 6 weeks is noted to have the most significant changes and increasing odor to the patient has reported. On evaluation yesterday I was highly suspicious at this is a breast cancer. There is necrosis along the lateral aspect. We discuss surgical options including biopsy. She's not currently a candidate for mastectomy due to the extensive minutes of the disease. Risks benefits alternatives were discussed and we'll plan to proceed as consented.  Procedure note:  Patient was taken to the or is placed in supine position the or table time the general anesthetic is administered. At this point a larger mask airway placed. Once the patient's airway secured her right breast is prepped with Betadine solution. Drapes are placed in standard fashion. At this point I did utilized a letter cautery to debride the necrotic tissue. This was an approximate 5 x 3 cm area. Port or for a regular and thickened. All necrotic tissue was removed. Due to necrotic tissue and surrounding the edges was noted to be thickened tissue consistent with suspected breast cancer. I then opted to take both the segment within the wound bed has a biopsy as well as a adjacent area of skin. These were both sent as permanent specimens to pathology. Hemostasis was good. I did pack the open wound with Betadine soaked gauze dressings. The breast was washed and dried with a moist dry towel. Sterile 4 x 4 dressings were placed and covered with an ABG dressing. The drapes removed the dressings were secured with  Medipore tape. The patient was allowed to come out of general anesthetic and stretcher the PACU in stable condition. At the conclusion of procedure all instrument, sponge, needle counts are correct. Patient tolerated procedure well.  Complications:  None apparent  EBL:  Minimal  Specimen:  Breast biopsy x2

## 2012-10-02 NOTE — Anesthesia Preprocedure Evaluation (Addendum)
Anesthesia Evaluation  Patient identified by MRN, date of birth, ID band Patient awake    Reviewed: Allergy & Precautions, H&P , NPO status , Patient's Chart, lab work & pertinent test results  History of Anesthesia Complications Negative for: history of anesthetic complications  Airway Mallampati: II      Dental  (+) Edentulous Upper and Edentulous Lower   Pulmonary neg pulmonary ROS,  breath sounds clear to auscultation        Cardiovascular hypertension, Pt. on medications Rhythm:Regular Rate:Normal     Neuro/Psych    GI/Hepatic negative GI ROS,   Endo/Other    Renal/GU      Musculoskeletal   Abdominal   Peds  Hematology   Anesthesia Other Findings   Reproductive/Obstetrics                           Anesthesia Physical Anesthesia Plan  ASA: II  Anesthesia Plan: General   Post-op Pain Management:    Induction: Intravenous  Airway Management Planned: LMA  Additional Equipment:   Intra-op Plan:   Post-operative Plan: Extubation in OR  Informed Consent: I have reviewed the patients History and Physical, chart, labs and discussed the procedure including the risks, benefits and alternatives for the proposed anesthesia with the patient or authorized representative who has indicated his/her understanding and acceptance.     Plan Discussed with:   Anesthesia Plan Comments:         Anesthesia Quick Evaluation  

## 2012-10-03 ENCOUNTER — Inpatient Hospital Stay (HOSPITAL_COMMUNITY): Payer: Medicaid Other

## 2012-10-03 ENCOUNTER — Encounter (HOSPITAL_COMMUNITY): Payer: Medicaid Other

## 2012-10-03 ENCOUNTER — Encounter (HOSPITAL_COMMUNITY): Payer: Self-pay

## 2012-10-03 DIAGNOSIS — D649 Anemia, unspecified: Secondary | ICD-10-CM

## 2012-10-03 DIAGNOSIS — D473 Essential (hemorrhagic) thrombocythemia: Secondary | ICD-10-CM

## 2012-10-03 LAB — CBC WITH DIFFERENTIAL/PLATELET
Basophils Absolute: 0 10*3/uL (ref 0.0–0.1)
Basophils Relative: 0 % (ref 0–1)
Eosinophils Absolute: 0.2 10*3/uL (ref 0.0–0.7)
Eosinophils Relative: 2 % (ref 0–5)
MCH: 29.1 pg (ref 26.0–34.0)
MCHC: 33.3 g/dL (ref 30.0–36.0)
MCV: 87.4 fL (ref 78.0–100.0)
Monocytes Absolute: 1 10*3/uL (ref 0.1–1.0)
Platelets: 504 10*3/uL — ABNORMAL HIGH (ref 150–400)
RDW: 13 % (ref 11.5–15.5)
WBC: 8.9 10*3/uL (ref 4.0–10.5)

## 2012-10-03 LAB — RETICULOCYTES
RBC.: 3.53 MIL/uL — ABNORMAL LOW (ref 3.87–5.11)
Retic Count, Absolute: 67.1 10*3/uL (ref 19.0–186.0)

## 2012-10-03 LAB — COMPREHENSIVE METABOLIC PANEL
ALT: 16 U/L (ref 0–35)
AST: 22 U/L (ref 0–37)
CO2: 30 mEq/L (ref 19–32)
Chloride: 99 mEq/L (ref 96–112)
Creatinine, Ser: 1.2 mg/dL — ABNORMAL HIGH (ref 0.50–1.10)
GFR calc Af Amer: 55 mL/min — ABNORMAL LOW (ref >90)
GFR calc non Af Amer: 47 mL/min — ABNORMAL LOW (ref >90)
Glucose, Bld: 102 mg/dL — ABNORMAL HIGH (ref 70–99)
Total Bilirubin: 0.4 mg/dL (ref 0.3–1.2)

## 2012-10-03 LAB — FERRITIN: Ferritin: 807 ng/mL — ABNORMAL HIGH (ref 10–291)

## 2012-10-03 LAB — FOLATE: Folate: 8.6 ng/mL

## 2012-10-03 LAB — IRON AND TIBC: UIBC: 214 ug/dL (ref 125–400)

## 2012-10-03 MED ORDER — TECHNETIUM TC 99M MEDRONATE IV KIT
25.0000 | PACK | Freq: Once | INTRAVENOUS | Status: AC | PRN
  Administered 2012-10-03: 26 via INTRAVENOUS

## 2012-10-03 MED ORDER — PANTOPRAZOLE SODIUM 40 MG PO TBEC
40.0000 mg | DELAYED_RELEASE_TABLET | Freq: Every day | ORAL | Status: DC
  Administered 2012-10-03 – 2012-10-04 (×2): 40 mg via ORAL
  Filled 2012-10-03 (×2): qty 1

## 2012-10-03 NOTE — Addendum Note (Signed)
Addendum  created 10/03/12 1005 by Moshe Salisbury, CRNA   Modules edited:Notes Section

## 2012-10-03 NOTE — Progress Notes (Signed)
UR Chart Review Completed  

## 2012-10-03 NOTE — Progress Notes (Signed)
Subjective: Patient seen in bed, older daughter is at the bedside.    She had her CT today and the report is pending at the time the patient is seen.  CT scans reviewed with Dr. Tyron Russell and Dr. Mariel Sleet.  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  Will await bone scan results before assigning her a stage.  She admits to some nausea after drinking the contrast for CT scans.  She was given IV Zofran and this helped.   I personally reviewed and went over laboratory results with the patient.  Anemia is stable.  Anemia is likely from acute infection of right breast.  Thrombocytosis is likely secondary to infection as well.  Will order an anemia panel.  Objective: Vital signs in last 24 hours: Temp:  [97.4 F (36.3 C)-99.4 F (37.4 C)] 97.5 F (36.4 C) (01/21 0417) Pulse Rate:  [76-98] 80  (01/21 0417) Resp:  [12-23] 18  (01/21 0417) BP: (111-158)/(59-84) 111/73 mmHg (01/21 0417) SpO2:  [94 %-100 %] 97 % (01/21 0417)  Intake/Output from previous day: 01/20 0800 - 01/21 0759 In: 1008.3 [P.O.:240; I.V.:608.3; IV Piggyback:160] Out: 15 [Blood:15] Intake/Output this shift: Total I/O In: 360 [P.O.:360] Out: -   General appearance: alert, cooperative and in bed with daughter at the bedside. Breast: Right breast is erythematous and tender.  Mass measuring at least 12 cm round clinically. Right breast skin is open with malodorous discharge.  Nicely packed and dressed.  Left breast mass noted deep to the areola measuring 4-5 cm round.  Lymphatic: No lymphadenopathy noted.  Lab Results:   [facility] 10/03/12 0508 10/02/12 0355  WBC 8.9 13.4*  HGB 10.4* 10.9*  HCT 31.2* 32.5*  PLT 504* 504*   BMET  Basename 10/03/12 0508 10/02/12 0355  NA 138 136  K 4.1 3.8  CL 99 96  CO2 30 28  GLUCOSE 102* 108*  BUN 10 12  CREATININE 1.20* 1.29*  CALCIUM 9.4 9.5    Studies/Results: Ct Chest W Contrast  10/03/2012  *RADIOLOGY REPORT*  Clinical  Data:  Newly diagnosed right breast carcinoma.  CT CHEST, ABDOMEN AND PELVIS WITH CONTRAST  Technique:  Multidetector CT imaging of the chest, abdomen and pelvis was performed following the standard protocol during bolus administration of intravenous contrast.  Contrast:  80mL OMNIPAQUE IOHEXOL 300 MG/ML  SOLN  Comparison:   None.  CT CHEST  Findings:  Multiple right breast soft tissue masses are seen, largest measuring 9.7 x 9.4 cm and showing both skin involvement and ulceration, as well as deep invasion of the right pectoralis muscle.  Right subpectoral and axillary lymphadenopathy is also seen, with bulkiest area of axillary lymphadenopathy measuring 5.9 x 4.6 cm.  In addition, there is a spiculated mass in the central left breast measuring through 3.8 x 2.4 cm and mild left axillary lymphadenopathy, suspicious for contralateral left breast carcinoma with axillary lymph node metastases.  No evidence of mediastinal or hilar lymphadenopathy.  No adenopathy seen along the internal mammary chains.  No evidence of pleural or pericardial effusion.  No suspicious pulmonary nodules or masses are identified.  No evidence of pulmonary infiltrate or central endobronchial lesion.  Mild bilateral lower lobe scarring noted. No suspicious bone lesions identified.  IMPRESSION:  1.  Multifocal right breast carcinoma with dominant mass measuring 9.7 cm and showing both skin ulceration and deep invasion of the right pectoralis muscle. 2.  Bulky right subpectoral and axillary lymphadenopathy. 3. 3.8 cm central left breast  mass and mild left axillary lymphadenopathy, highly suspicious for contralateral left breast carcinoma with axillary lymph node metastases. Further evaluation with mammography and/or breast MRI is recommended. 4.  No evidence of pulmonary metastases or hilar or mediastinal lymphadenopathy.  CT ABDOMEN AND PELVIS  Findings:  The liver, adrenal glands, pancreas, spleen, and left kidney are normal appearance.  Right  lower pole renal cyst is seen however there is no evidence of renal masses or hydronephrosis.  Multiple small uterine fibroids are seen, largest measuring approximately 4 cm.  Adnexal regions are unremarkable.  No other soft tissue masses or lymphadenopathy identified within the abdomen or pelvis.  No evidence of inflammatory process or abnormal fluid collections within the abdomen or pelvis.  No suspicious bone lesions are identified.  Thoracolumbar spine degenerative changes noted.  IMPRESSION:  1.  No evidence of metastatic disease or other acute findings within the abdomen or pelvis. 2.  Several small uterine fibroids, largest measuring approximately 4 cm.   Original Report Authenticated By: Myles Rosenthal, M.D.    Ct Abdomen Pelvis W Contrast  10/03/2012  *RADIOLOGY REPORT*  Clinical Data:  Newly diagnosed right breast carcinoma.  CT CHEST, ABDOMEN AND PELVIS WITH CONTRAST  Technique:  Multidetector CT imaging of the chest, abdomen and pelvis was performed following the standard protocol during bolus administration of intravenous contrast.  Contrast:  80mL OMNIPAQUE IOHEXOL 300 MG/ML  SOLN  Comparison:   None.  CT CHEST  Findings:  Multiple right breast soft tissue masses are seen, largest measuring 9.7 x 9.4 cm and showing both skin involvement and ulceration, as well as deep invasion of the right pectoralis muscle.  Right subpectoral and axillary lymphadenopathy is also seen, with bulkiest area of axillary lymphadenopathy measuring 5.9 x 4.6 cm.  In addition, there is a spiculated mass in the central left breast measuring through 3.8 x 2.4 cm and mild left axillary lymphadenopathy, suspicious for contralateral left breast carcinoma with axillary lymph node metastases.  No evidence of mediastinal or hilar lymphadenopathy.  No adenopathy seen along the internal mammary chains.  No evidence of pleural or pericardial effusion.  No suspicious pulmonary nodules or masses are identified.  No evidence of pulmonary  infiltrate or central endobronchial lesion.  Mild bilateral lower lobe scarring noted. No suspicious bone lesions identified.  IMPRESSION:  1.  Multifocal right breast carcinoma with dominant mass measuring 9.7 cm and showing both skin ulceration and deep invasion of the right pectoralis muscle. 2.  Bulky right subpectoral and axillary lymphadenopathy. 3. 3.8 cm central left breast mass and mild left axillary lymphadenopathy, highly suspicious for contralateral left breast carcinoma with axillary lymph node metastases. Further evaluation with mammography and/or breast MRI is recommended. 4.  No evidence of pulmonary metastases or hilar or mediastinal lymphadenopathy.  CT ABDOMEN AND PELVIS  Findings:  The liver, adrenal glands, pancreas, spleen, and left kidney are normal appearance.  Right lower pole renal cyst is seen however there is no evidence of renal masses or hydronephrosis.  Multiple small uterine fibroids are seen, largest measuring approximately 4 cm.  Adnexal regions are unremarkable.  No other soft tissue masses or lymphadenopathy identified within the abdomen or pelvis.  No evidence of inflammatory process or abnormal fluid collections within the abdomen or pelvis.  No suspicious bone lesions are identified.  Thoracolumbar spine degenerative changes noted.  IMPRESSION:  1.  No evidence of metastatic disease or other acute findings within the abdomen or pelvis. 2.  Several small uterine fibroids, largest measuring approximately  4 cm.   Original Report Authenticated By: Myles Rosenthal, M.D.     Medications: I have reviewed the patient's current medications.  Assessment/Plan: 1. Large right breast mass, probably breast cancer.  Mass has broken through skin with malodorous discharge. CA 27.29 pending.  S/P CT CAP with contrast.  Report pending at this time. Bone scan ordered. 2. Anemia, stable.  Will order anemia panel.  Likely secondary to acute right breast infection. 3. Thrombocytosis, stable.    Likely secondary to acute right breast infection. 4. Renal insufficiency 5. HTN 6. Hyperlipidemia 7. 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 8. Nausea, secondary to CT contrast consumption.  Zofran ordered PRN. 9. Left breast mass, spiculated. 10. 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 Mariel Sleet as well.    Patient seen and examined by Dr. Mariel Sleet.   LOS: 2 days    Erika Nunez 10/03/2012

## 2012-10-03 NOTE — Progress Notes (Signed)
The patient is receiving Protonix by the intravenous route.  Based on criteria approved by the Pharmacy and Therapeutics Committee and the Medical Executive Committee, the medication is being converted to the equivalent oral dose form.  These criteria include: -No Active GI bleeding -Able to tolerate diet of full liquids (or better) or tube feeding OR able to tolerate other medications by the oral or enteral route  If you have any questions about this conversion, please contact the Pharmacy Department (ext [phone]).  Thank you.  Elson Clan, [facility] 10/03/2012 1:37 PM

## 2012-10-04 ENCOUNTER — Other Ambulatory Visit (HOSPITAL_COMMUNITY): Payer: Self-pay | Admitting: Oncology

## 2012-10-04 ENCOUNTER — Encounter (HOSPITAL_COMMUNITY): Payer: Self-pay | Admitting: General Surgery

## 2012-10-04 DIAGNOSIS — N63 Unspecified lump in unspecified breast: Secondary | ICD-10-CM

## 2012-10-04 DIAGNOSIS — M899 Disorder of bone, unspecified: Secondary | ICD-10-CM

## 2012-10-04 MED ORDER — DOXYCYCLINE HYCLATE 100 MG PO TABS: 100.0000 mg | ORAL_TABLET | Freq: Two times a day (BID) | ORAL | Status: DC

## 2012-10-04 MED ORDER — HYDROCODONE-ACETAMINOPHEN 5-325 MG PO TABS: 1.0000 | ORAL_TABLET | ORAL | Status: DC | PRN

## 2012-10-04 NOTE — Discharge Summary (Signed)
Physician Discharge Summary  Patient ID: Erika Nunez MRN: [id number] DOB/AGE: 63-30-1952 63 y.o.  Admit date: 10/01/2012 Discharge date: 10/04/2012  Admission Diagnoses: Breast mass   Discharge Diagnoses: Breast mass with necrosis Active Problems:  * No active hospital problems. *    Discharged Condition: stable  Hospital Course: Patient presented to [facility] with approximately 6 month history of a worsening infection in the right breast. This increased significantly per the patient over the last 6 weeks. She was seen initially in the emergency department they were concerned for a complicated abscess. On surgical evaluation it was noted and obvious the patient had a breast mass with all likelihood spanning a breast cancer. There is central necrosis with necrotic tissue present. She was taken to the operating room for both a surgical biopsy as well as debridement of the necrotic tissue. Oncology consultation was obtained during her hospitalization. Further workup with radiographic evaluation was obtained. At this time patient is comfortable she will be discharged with home health for continued wound care. Plan followup with surgically and with oncology clinic.  Consults: hematology/oncology  Significant Diagnostic Studies: labs: Tumor markers, radiology: CT scan: Abdomen pelvis and chest and nuclear medicine: Bone scan  Treatments: IV hydration, antibiotics: Zosyn and surgery: Surgical debridement of right breast mass and breast biopsy  Discharge Exam: Blood pressure 110/75, pulse 78, temperature 97.5 F (36.4 C), temperature source Oral, resp. rate 19, height 5\' 8"  (1.727 m), weight 68.04 kg (150 lb), SpO2 98.00%. General appearance: alert and no distress Resp: clear to auscultation bilaterally Breasts: Right-sided breast mass. Wound with serous sanguinous discharge. Minimal odor. Cardio: regular rate and rhythm  Disposition: Final discharge disposition not  confirmed  Discharge Orders    Future Orders Please Complete By Expires   Diet - low sodium heart healthy      Increase activity slowly      Discharge wound care:      Comments:   Change dressing at least once daily, ideally twice daily.  Moisten gauze and cover with dry dressing.  Anticipate some discharge and change as needed if dressing becomes saturated.       Medication List     As of 10/04/2012 11:02 AM    TAKE these medications         doxycycline 100 MG tablet   Commonly known as: VIBRA-TABS   Take 1 tablet (100 mg total) by mouth 2 (two) times daily.      HYDROcodone-acetaminophen 5-325 MG per tablet   Commonly known as: NORCO/VICODIN   Take 1-2 tablets by mouth every 4 (four) hours as needed.      ibuprofen 200 MG tablet   Commonly known as: ADVIL,MOTRIN   Take 200 mg by mouth every 6 (six) hours as needed. Pain.      lisinopril 20 MG tablet   Commonly known as: PRINIVIL,ZESTRIL   Take 20 mg by mouth daily.      lisinopril-hydrochlorothiazide 20-25 MG per tablet   Commonly known as: PRINZIDE,ZESTORETIC   Take 1 tablet by mouth daily.      pravastatin 40 MG tablet   Commonly known as: PRAVACHOL   Take 80 mg by mouth at bedtime.      sertraline 100 MG tablet   Commonly known as: ZOLOFT   Take 150 mg by mouth daily.           Follow-up Information    Follow up with Randall An, MD. On 10/09/2012.   Contact information:  [address] [place] [address] [phone]       Follow up with Hermie Reagor C, MD. In 1 week.   Contact information:   [address] [place] [address] [phone]          Signed: Fabio Bering 10/04/2012, 11:02 AM

## 2012-10-04 NOTE — Progress Notes (Signed)
POD 1 Subjective: Patient without significant pain.  No fever or chills.  No nausea.   Objective: Vital signs in last 24 hours: Temp:  [97.5 F (36.4 Nunez)-97.8 F (36.6 Nunez)] 97.5 F (36.4 Nunez) (01/22 0432) Pulse Rate:  [73-78] 78  (01/22 0432) Resp:  [17-19] 19  (01/22 0432) BP: (106-119)/(70-75) 110/75 mmHg (01/22 0432) SpO2:  [96 %-98 %] 98 % (01/22 0432) Last BM Date: 10/03/12  Intake/Output from previous day: 01/21 0701 - 01/22 0700 In: 840 [P.O.:840] Out: -  Intake/Output this shift:    General appearance: alert and no distress Breasts: wound dressed.  Minimal odor drainage at this time.   Lab Results:   [facility] 10/03/12 0508 10/02/12 0355  WBC 8.9 13.4*  HGB 10.4* 10.9*  HCT 31.2* 32.5*  PLT 504* 504*   BMET  Basename 10/03/12 0508 10/02/12 0355  NA 138 136  K 4.1 3.8  CL 99 96  CO2 30 28  GLUCOSE 102* 108*  BUN 10 12  CREATININE 1.20* 1.29*  CALCIUM 9.4 9.5   PT/INR No results found for this basename: LABPROT:2,INR:2 in the last 72 hours ABG No results found for this basename: PHART:2,PCO2:2,PO2:2,HCO3:2 in the last 72 hours  Studies/Results: Ct Chest W Contrast  10/03/2012  *RADIOLOGY REPORT*  Clinical Data:  Newly diagnosed right breast carcinoma.  CT CHEST, ABDOMEN AND PELVIS WITH CONTRAST  Technique:  Multidetector CT imaging of the chest, abdomen and pelvis was performed following the standard protocol during bolus administration of intravenous contrast.  Contrast:  80mL OMNIPAQUE IOHEXOL 300 MG/ML  SOLN  Comparison:   None.  CT CHEST  Findings:  Multiple right breast soft tissue masses are seen, largest measuring 9.7 x 9.4 cm and showing both skin involvement and ulceration, as well as deep invasion of the right pectoralis muscle.  Right subpectoral and axillary lymphadenopathy is also seen, with bulkiest area of axillary lymphadenopathy measuring 5.9 x 4.6 cm.  In addition, there is a spiculated mass in the central left breast measuring through 3.8 x 2.4  cm and mild left axillary lymphadenopathy, suspicious for contralateral left breast carcinoma with axillary lymph node metastases.  No evidence of mediastinal or hilar lymphadenopathy.  No adenopathy seen along the internal mammary chains.  No evidence of pleural or pericardial effusion.  No suspicious pulmonary nodules or masses are identified.  No evidence of pulmonary infiltrate or central endobronchial lesion.  Mild bilateral lower lobe scarring noted. No suspicious bone lesions identified.  IMPRESSION:  1.  Multifocal right breast carcinoma with dominant mass measuring 9.7 cm and showing both skin ulceration and deep invasion of the right pectoralis muscle. 2.  Bulky right subpectoral and axillary lymphadenopathy. 3. 3.8 cm central left breast mass and mild left axillary lymphadenopathy, highly suspicious for contralateral left breast carcinoma with axillary lymph node metastases. Further evaluation with mammography and/or breast MRI is recommended. 4.  No evidence of pulmonary metastases or hilar or mediastinal lymphadenopathy.  CT ABDOMEN AND PELVIS  Findings:  The liver, adrenal glands, pancreas, spleen, and left kidney are normal appearance.  Right lower pole renal cyst is seen however there is no evidence of renal masses or hydronephrosis.  Multiple small uterine fibroids are seen, largest measuring approximately 4 cm.  Adnexal regions are unremarkable.  No other soft tissue masses or lymphadenopathy identified within the abdomen or pelvis.  No evidence of inflammatory process or abnormal fluid collections within the abdomen or pelvis.  No suspicious bone lesions are identified.  Thoracolumbar spine degenerative changes  noted.  IMPRESSION:  1.  No evidence of metastatic disease or other acute findings within the abdomen or pelvis. 2.  Several small uterine fibroids, largest measuring approximately 4 cm.   Original Report Authenticated By: Myles Rosenthal, M.D.    Nm Bone Scan Whole Body  10/03/2012   *RADIOLOGY REPORT*  Clinical Data: New diagnosis right breast cancer  NUCLEAR MEDICINE WHOLE BODY BONE SCINTIGRAPHY  Technique:  Whole body anterior and posterior images were obtained approximately 3 hours after intravenous injection of radiopharmaceutical.  Radiopharmaceutical: CURIE TC-MDP TECHNETIUM TC 77M MEDRONATE IV KIT  Comparison: CT chest abdomen and pelvis 10/03/2012, CT maxillofacial 09/03/2005  Findings: Uptake is seen at the right foot and at both shoulders typically degenerative. Uptake identified at the lower thoracic spine and at the mid lumbar spine, corresponding to levels of significant degenerative disc disease changes on CT. Single focus of nonspecific increased tracer localization at the superolateral right orbit. No other abnormal sites of osseous tracer accumulation are identified. Significant tracer accumulation in the right breast and chest wall corresponding to extensive tumor present on CT. Remaining soft tissue and urinary tract distribution of tracer is unremarkable.  IMPRESSION: Uptake within the lower thoracic and mid lumbar spine corresponding to levels of significant degenerative disc disease changes present on CT. Single nonspecific focus of increased tracer accumulation at the superolateral right orbit; this corresponds to an area of bony lucency 11 x 7 mm at the superolateral right orbit on prior maxillofacial CT. While metastasis could cause tracer accumulation at this site, the presence of this finding on a prior CT from 2006 makes this more likely due to a longstanding benign process such as fibrous dysplasia. No additional sites of abnormal osseous tracer accumulation identified to suggest osseous metastatic disease. Specifically, no abnormal tracer accumulation is identified within the sternum.   Original Report Authenticated By: Ulyses Southward, M.D.    Ct Abdomen Pelvis W Contrast  10/03/2012  *RADIOLOGY REPORT*  Clinical Data:  Newly diagnosed right breast  carcinoma.  CT CHEST, ABDOMEN AND PELVIS WITH CONTRAST  Technique:  Multidetector CT imaging of the chest, abdomen and pelvis was performed following the standard protocol during bolus administration of intravenous contrast.  Contrast:  80mL OMNIPAQUE IOHEXOL 300 MG/ML  SOLN  Comparison:   None.  CT CHEST  Findings:  Multiple right breast soft tissue masses are seen, largest measuring 9.7 x 9.4 cm and showing both skin involvement and ulceration, as well as deep invasion of the right pectoralis muscle.  Right subpectoral and axillary lymphadenopathy is also seen, with bulkiest area of axillary lymphadenopathy measuring 5.9 x 4.6 cm.  In addition, there is a spiculated mass in the central left breast measuring through 3.8 x 2.4 cm and mild left axillary lymphadenopathy, suspicious for contralateral left breast carcinoma with axillary lymph node metastases.  No evidence of mediastinal or hilar lymphadenopathy.  No adenopathy seen along the internal mammary chains.  No evidence of pleural or pericardial effusion.  No suspicious pulmonary nodules or masses are identified.  No evidence of pulmonary infiltrate or central endobronchial lesion.  Mild bilateral lower lobe scarring noted. No suspicious bone lesions identified.  IMPRESSION:  1.  Multifocal right breast carcinoma with dominant mass measuring 9.7 cm and showing both skin ulceration and deep invasion of the right pectoralis muscle. 2.  Bulky right subpectoral and axillary lymphadenopathy. 3. 3.8 cm central left breast mass and mild left axillary lymphadenopathy, highly suspicious for contralateral left breast carcinoma with axillary lymph node metastases.  Further evaluation with mammography and/or breast MRI is recommended. 4.  No evidence of pulmonary metastases or hilar or mediastinal lymphadenopathy.  CT ABDOMEN AND PELVIS  Findings:  The liver, adrenal glands, pancreas, spleen, and left kidney are normal appearance.  Right lower pole renal cyst is seen  however there is no evidence of renal masses or hydronephrosis.  Multiple small uterine fibroids are seen, largest measuring approximately 4 cm.  Adnexal regions are unremarkable.  No other soft tissue masses or lymphadenopathy identified within the abdomen or pelvis.  No evidence of inflammatory process or abnormal fluid collections within the abdomen or pelvis.  No suspicious bone lesions are identified.  Thoracolumbar spine degenerative changes noted.  IMPRESSION:  1.  No evidence of metastatic disease or other acute findings within the abdomen or pelvis. 2.  Several small uterine fibroids, largest measuring approximately 4 cm.   Original Report Authenticated By: Myles Rosenthal, M.D.     Anti-infectives: Anti-infectives     Start     Dose/Rate Route Frequency Ordered Stop   10/01/12 1430   piperacillin-tazobactam (ZOSYN) IVPB 3.375 g        3.375 g 12.5 mL/hr over 240 Minutes Intravenous 3 times per day 10/01/12 1430     10/01/12 1215   piperacillin-tazobactam (ZOSYN) IVPB 3.375 g        3.375 g 100 mL/hr over 30 Minutes Intravenous  Once 10/01/12 1202 10/01/12 1517   10/01/12 1200   vancomycin (VANCOCIN) IVPB 1000 mg/200 mL premix        1,000 mg 200 mL/hr over 60 Minutes Intravenous  Once 10/01/12 1155 10/01/12 1316          Assessment/Plan: s/p Procedure(s) (LRB) with comments: BREAST BIOPSY (Right) Breast mass.  CT seen.  Extensive local mass effect.  Axillary mass.  Path pending but not currently mastectomy candidate due to extent of mass and limited closure options.   LOS 2 days   Erika Nunez 10/04/2012

## 2012-10-04 NOTE — Progress Notes (Signed)
10/04/12 1510 Patient being discharged home today. IV site d/c'd and within normal limits. Reviewed discharge instructions with patient and daughter at bedside. Given copy of instructions, medication list, prescription, and follow-up appointments.  Verbalized understanding. Reviewed dressing/incision care with patient and daughter, new dressing applied prior to discharge. Stated understood care and signs for when to call MD. [facility]. No c/o pain or discomfort at time of discharge. Noted to have lisinopril and lisinopril/hydrochlorothiazide on medication list, instructed pt to continue lisinopril/hydrochlorothiazide as previous per Dr Lovell Sheehan instructions. Pt in stable condition awaiting w/c for discharge. Earnstine Regal, RN

## 2012-10-04 NOTE — Progress Notes (Signed)
Subjective: Patient prepared for discharge today.  She is seen with her daughter at the bed side  We spent some time reviewing her staging scans.  I personally reviewed and went over radiographic studies with the patient.  We request the patient undergo a PET scan for further information regarding her staging.  She is agreeable to this and I will discuss this with our scheduler.   Erika Nunez has an appointment with the [facility] on Monday at 12:30 pm.  At that time the patient will ascertain her scheduled PET scan date and time.   We will see her then and we should have pathology back by that time.  Objective: Vital signs in last 24 hours: Temp:  [97.5 F (36.4 C)-97.8 F (36.6 C)] 97.5 F (36.4 C) (01/22 0432) Pulse Rate:  [73-78] 78  (01/22 0432) Resp:  [17-19] 19  (01/22 0432) BP: (106-119)/(70-75) 110/75 mmHg (01/22 0432) SpO2:  [96 %-98 %] 98 % (01/22 0432)  Intake/Output from previous day: 01/21 0800 - 01/22 0759 In: 840 [P.O.:840] Out: -  Intake/Output this shift:    General appearance: alert, cooperative, moderately obese and ready to go home.  Lab Results:   [facility] 10/03/12 0508 10/02/12 0355  WBC 8.9 13.4*  HGB 10.4* 10.9*  HCT 31.2* 32.5*  PLT 504* 504*   BMET  Basename 10/03/12 0508 10/02/12 0355  NA 138 136  K 4.1 3.8  CL 99 96  CO2 30 28  GLUCOSE 102* 108*  BUN 10 12  CREATININE 1.20* 1.29*  CALCIUM 9.4 9.5    Studies/Results: Ct Chest W Contrast  10/03/2012  *RADIOLOGY REPORT*  Clinical Data:  Newly diagnosed right breast carcinoma.  CT CHEST, ABDOMEN AND PELVIS WITH CONTRAST  Technique:  Multidetector CT imaging of the chest, abdomen and pelvis was performed following the standard protocol during bolus administration of intravenous contrast.  Contrast:  80mL OMNIPAQUE IOHEXOL 300 MG/ML  SOLN  Comparison:   None.  CT CHEST  Findings:  Multiple right breast soft tissue masses are seen, largest measuring 9.7 x 9.4 cm and showing both  skin involvement and ulceration, as well as deep invasion of the right pectoralis muscle.  Right subpectoral and axillary lymphadenopathy is also seen, with bulkiest area of axillary lymphadenopathy measuring 5.9 x 4.6 cm.  In addition, there is a spiculated mass in the central left breast measuring through 3.8 x 2.4 cm and mild left axillary lymphadenopathy, suspicious for contralateral left breast carcinoma with axillary lymph node metastases.  No evidence of mediastinal or hilar lymphadenopathy.  No adenopathy seen along the internal mammary chains.  No evidence of pleural or pericardial effusion.  No suspicious pulmonary nodules or masses are identified.  No evidence of pulmonary infiltrate or central endobronchial lesion.  Mild bilateral lower lobe scarring noted. No suspicious bone lesions identified.  IMPRESSION:  1.  Multifocal right breast carcinoma with dominant mass measuring 9.7 cm and showing both skin ulceration and deep invasion of the right pectoralis muscle. 2.  Bulky right subpectoral and axillary lymphadenopathy. 3. 3.8 cm central left breast mass and mild left axillary lymphadenopathy, highly suspicious for contralateral left breast carcinoma with axillary lymph node metastases. Further evaluation with mammography and/or breast MRI is recommended. 4.  No evidence of pulmonary metastases or hilar or mediastinal lymphadenopathy.  CT ABDOMEN AND PELVIS  Findings:  The liver, adrenal glands, pancreas, spleen, and left kidney are normal appearance.  Right lower pole renal cyst is seen however there is no evidence  of renal masses or hydronephrosis.  Multiple small uterine fibroids are seen, largest measuring approximately 4 cm.  Adnexal regions are unremarkable.  No other soft tissue masses or lymphadenopathy identified within the abdomen or pelvis.  No evidence of inflammatory process or abnormal fluid collections within the abdomen or pelvis.  No suspicious bone lesions are identified.  Thoracolumbar  spine degenerative changes noted.  IMPRESSION:  1.  No evidence of metastatic disease or other acute findings within the abdomen or pelvis. 2.  Several small uterine fibroids, largest measuring approximately 4 cm.   Original Report Authenticated By: Myles Rosenthal, M.D.    Nm Bone Scan Whole Body  10/03/2012  *RADIOLOGY REPORT*  Clinical Data: New diagnosis right breast cancer  NUCLEAR MEDICINE WHOLE BODY BONE SCINTIGRAPHY  Technique:  Whole body anterior and posterior images were obtained approximately 3 hours after intravenous injection of radiopharmaceutical.  Radiopharmaceutical: CURIE TC-MDP TECHNETIUM TC 29M MEDRONATE IV KIT  Comparison: CT chest abdomen and pelvis 10/03/2012, CT maxillofacial 09/03/2005  Findings: Uptake is seen at the right foot and at both shoulders typically degenerative. Uptake identified at the lower thoracic spine and at the mid lumbar spine, corresponding to levels of significant degenerative disc disease changes on CT. Single focus of nonspecific increased tracer localization at the superolateral right orbit. No other abnormal sites of osseous tracer accumulation are identified. Significant tracer accumulation in the right breast and chest wall corresponding to extensive tumor present on CT. Remaining soft tissue and urinary tract distribution of tracer is unremarkable.  IMPRESSION: Uptake within the lower thoracic and mid lumbar spine corresponding to levels of significant degenerative disc disease changes present on CT. Single nonspecific focus of increased tracer accumulation at the superolateral right orbit; this corresponds to an area of bony lucency 11 x 7 mm at the superolateral right orbit on prior maxillofacial CT. While metastasis could cause tracer accumulation at this site, the presence of this finding on a prior CT from 2006 makes this more likely due to a longstanding benign process such as fibrous dysplasia. No additional sites of abnormal osseous tracer  accumulation identified to suggest osseous metastatic disease. Specifically, no abnormal tracer accumulation is identified within the sternum.   Original Report Authenticated By: Ulyses Southward, M.D.    Ct Abdomen Pelvis W Contrast  10/03/2012  *RADIOLOGY REPORT*  Clinical Data:  Newly diagnosed right breast carcinoma.  CT CHEST, ABDOMEN AND PELVIS WITH CONTRAST  Technique:  Multidetector CT imaging of the chest, abdomen and pelvis was performed following the standard protocol during bolus administration of intravenous contrast.  Contrast:  80mL OMNIPAQUE IOHEXOL 300 MG/ML  SOLN  Comparison:   None.  CT CHEST  Findings:  Multiple right breast soft tissue masses are seen, largest measuring 9.7 x 9.4 cm and showing both skin involvement and ulceration, as well as deep invasion of the right pectoralis muscle.  Right subpectoral and axillary lymphadenopathy is also seen, with bulkiest area of axillary lymphadenopathy measuring 5.9 x 4.6 cm.  In addition, there is a spiculated mass in the central left breast measuring through 3.8 x 2.4 cm and mild left axillary lymphadenopathy, suspicious for contralateral left breast carcinoma with axillary lymph node metastases.  No evidence of mediastinal or hilar lymphadenopathy.  No adenopathy seen along the internal mammary chains.  No evidence of pleural or pericardial effusion.  No suspicious pulmonary nodules or masses are identified.  No evidence of pulmonary infiltrate or central endobronchial lesion.  Mild bilateral lower lobe scarring noted. No suspicious  bone lesions identified.  IMPRESSION:  1.  Multifocal right breast carcinoma with dominant mass measuring 9.7 cm and showing both skin ulceration and deep invasion of the right pectoralis muscle. 2.  Bulky right subpectoral and axillary lymphadenopathy. 3. 3.8 cm central left breast mass and mild left axillary lymphadenopathy, highly suspicious for contralateral left breast carcinoma with axillary lymph node metastases.  Further evaluation with mammography and/or breast MRI is recommended. 4.  No evidence of pulmonary metastases or hilar or mediastinal lymphadenopathy.  CT ABDOMEN AND PELVIS  Findings:  The liver, adrenal glands, pancreas, spleen, and left kidney are normal appearance.  Right lower pole renal cyst is seen however there is no evidence of renal masses or hydronephrosis.  Multiple small uterine fibroids are seen, largest measuring approximately 4 cm.  Adnexal regions are unremarkable.  No other soft tissue masses or lymphadenopathy identified within the abdomen or pelvis.  No evidence of inflammatory process or abnormal fluid collections within the abdomen or pelvis.  No suspicious bone lesions are identified.  Thoracolumbar spine degenerative changes noted.  IMPRESSION:  1.  No evidence of metastatic disease or other acute findings within the abdomen or pelvis. 2.  Several small uterine fibroids, largest measuring approximately 4 cm.   Original Report Authenticated By: Myles Rosenthal, M.D.     Medications: I have reviewed the patient's current medications.  Assessment/Plan: 1. Large right breast mass, probably breast cancer. Mass has broken through skin with malodorous discharge. CA 27.29 is 50. S/P CT CAP with contrast and bone scan.  2. Left breast mass, spiculated.  Will order outpatient PET scan for further imaging.   3. 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 Tyron Russell, but bone scan did not correlate.  Will order outpatient PET scan for further imaging.   4. Anemia, stable. Anemia of chronic disease picture per anemia panel.   Patient has renal insufficiency.  5. Thrombocytosis, stable. Likely secondary to acute right breast infection.  6. Renal insufficiency  7 . HTN  8. Hyperlipidemia  9. 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  10. Nausea, secondary to CT contrast consumption. Zofran ordered PRN.    Patient and plan discussed with Dr.  Glenford Peers and he is in agreement with the aforementioned.     LOS: 3 days    Jarret Torre 10/04/2012

## 2012-10-04 NOTE — Progress Notes (Signed)
10/04/12 1545 Patient left floor in stable condition for discharge home, via w/c accompanied by nurse tech. Earnstine Regal, RN

## 2012-10-09 ENCOUNTER — Encounter (HOSPITAL_COMMUNITY): Payer: Self-pay | Admitting: Oncology

## 2012-10-09 ENCOUNTER — Encounter (HOSPITAL_COMMUNITY): Payer: Self-pay | Attending: Oncology | Admitting: Oncology

## 2012-10-09 VITALS — BP 155/88 | HR 103 | Temp 97.7°F | Resp 20 | Ht 66.0 in | Wt 200.7 lb

## 2012-10-09 DIAGNOSIS — C773 Secondary and unspecified malignant neoplasm of axilla and upper limb lymph nodes: Secondary | ICD-10-CM

## 2012-10-09 DIAGNOSIS — Z171 Estrogen receptor negative status [ER-]: Secondary | ICD-10-CM

## 2012-10-09 DIAGNOSIS — C50919 Malignant neoplasm of unspecified site of unspecified female breast: Secondary | ICD-10-CM

## 2012-10-09 NOTE — Progress Notes (Signed)
Problem number 1 right sided breast cancer presenting with a large breast mass at least 15-18 cm across with ulceration, skin involvement, axillary lymph node involvement, giving her at least a T4 B., N2 a cancer the right breast. She may have metastatic disease to the opposite breast potentially giving her stage IV disease versus stage III B. Disease. Her bone scan showed no obvious metastases and her CAT scans also did not show liver metastases or lung metastases. 2 areas on a CAT scans were suspicious for bone involvement name of the sternum and a high vertebral body in the thoracic area that appears suspicious but were negative on the bone scan. A PET scan is pending.  She is accompanied by her daughter. She has 4 children. One son is stationed in [place] in the [employer]. The other children live in close proximity.  I talked with Dr. Leticia Penna today about port placement and biopsy the left breast mass which is also clearly seen on CAT scans with suspicious adenopathy as well. I think it is important to distinguish whether this is metastatic disease or a second primary from a genetic testing standpoint.  Dr. Leticia Penna is due to see her this Thursday.  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  She'll need a port placement since hormonal therapy thus far is out of question. Her prognosis as I told her very guarded however. We did go over chemotherapy and its possible side effects etc.

## 2012-10-09 NOTE — Patient Instructions (Addendum)
[facility] Discharge Instructions  RECOMMENDATIONS MADE BY THE CONSULTANT AND ANY TEST RESULTS WILL BE SENT TO YOUR REFERRING PHYSICIAN.  Dr.Neijstrom will speak with Dr.Zieglar regarding additional biopsy of left breast and port-a-cath placement. Either our office or Dr.Zieglar's office will call you with that appointment. You have been scheduled for a PET scan at [facility]. You are to have nothing to eat after 12 midnight the night before the PET scan. You may take your medicine the morning of the PET scan but with only enough water to swallow pills. You should watch the amount of sugar intake for 48 hours prior to the test as this test is sensitive to glucose. Do not chew gum or eat candy on the way to the appointment. Return to clinic 10/23/12 to see MD.  Thank you for choosing [facility] to provide your oncology and hematology care.  To afford each patient quality time with our providers, please arrive at least 15 minutes before your scheduled appointment time.  With your help, our goal is to use those 15 minutes to complete the necessary work-up to ensure our physicians have the information they need to help with your evaluation and healthcare recommendations.    Effective January 1st, 2014, we ask that you re-schedule your appointment with our physicians should you arrive 10 or more minutes late for your appointment.  We strive to give you quality time with our providers, and arriving late affects you and other patients whose appointments are after yours.    Again, thank you for choosing [facility].  Our hope is that these requests will decrease the amount of time that you wait before being seen by our physicians.       _____________________________________________________________  Should you have questions after your visit to [facility], please contact our office at [phone] between the hours of 8:30 a.m.  and 5:00 p.m.  Voicemails left after 4:30 p.m. will not be returned until the following business day.  For prescription refill requests, have your pharmacy contact our office with your prescription refill request.

## 2012-10-12 ENCOUNTER — Encounter (HOSPITAL_COMMUNITY)
Admit: 2012-10-12 | Discharge: 2012-10-12 | Disposition: A | Discharge status: Home / Self Care | Payer: Medicaid Other | Attending: Oncology | Admitting: Oncology

## 2012-10-12 ENCOUNTER — Other Ambulatory Visit (HOSPITAL_COMMUNITY): Payer: Self-pay | Admitting: Oncology

## 2012-10-12 DIAGNOSIS — C50919 Malignant neoplasm of unspecified site of unspecified female breast: Secondary | ICD-10-CM | POA: Insufficient documentation

## 2012-10-12 DIAGNOSIS — N63 Unspecified lump in unspecified breast: Secondary | ICD-10-CM

## 2012-10-12 DIAGNOSIS — C7951 Secondary malignant neoplasm of bone: Secondary | ICD-10-CM | POA: Insufficient documentation

## 2012-10-12 DIAGNOSIS — C773 Secondary and unspecified malignant neoplasm of axilla and upper limb lymph nodes: Secondary | ICD-10-CM | POA: Insufficient documentation

## 2012-10-12 MED ORDER — TECHNETIUM TC 99M MEBROFENIN IV KIT
19.9000 | PACK | Freq: Once | INTRAVENOUS | Status: AC | PRN
  Administered 2012-10-12: 19.9 via INTRAVENOUS

## 2012-10-13 ENCOUNTER — Other Ambulatory Visit (HOSPITAL_COMMUNITY): Payer: Self-pay | Admitting: Oncology

## 2012-10-13 ENCOUNTER — Telehealth (HOSPITAL_COMMUNITY): Payer: Self-pay | Admitting: Oncology

## 2012-10-13 DIAGNOSIS — C50919 Malignant neoplasm of unspecified site of unspecified female breast: Secondary | ICD-10-CM

## 2012-10-13 NOTE — Telephone Encounter (Signed)
In follow-up with patient regarding biopsy and port-a-cath placement, I learned that patient no-showed for her Zieglar appointment scheduled yesterday (10/12/12).  I contacted Erika Nunez to confirm that she will be following up with Zieglar for port placement and to contact our office when she has a surgery date - [name] verbalized understanding.  Biopsy was completed on 10/02/12 - results are available in Results Review for pathology.

## 2012-10-15 MED ORDER — PROCHLORPERAZINE 25 MG RE SUPP: 25.0000 mg | Freq: Four times a day (QID) | RECTAL | Status: DC | PRN

## 2012-10-15 NOTE — Patient Instructions (Addendum)
[facility]   CHEMOTHERAPY INSTRUCTIONS  Taxotere - bone marrow suppression (lowers white blood cells (fight infection), lowers red blood cells (make up your blood), lowers platelets (help blood to clot). This chemo can cause fluid retention. You will be responsible for taking a steroid called Dexamethasone at home prior to and after Taxotere. This steroid will keep you from having fluid retention. Take it whether you think you need it or not. Can cause hair loss, skin/nail changes (darkening of the nail beds, pain where the nail bed meets the skin, loosening of the nail beds, dry skin, palms of hands and soles of feet may darken or get sensitive, nausea/vomiting, paresthesia (numbness or tingling) in extremities - we need to know if this develops, mucositis (inflammation of any mucosal membrane - the mouth, throat), mouth sores, neurotoxicity (loss of memory, headaches, trouble sleeping, etc.), can also cause excessive tear production. Please let us know if any side effect develops.   Carboplatin - this medication can be hard on your kidneys - this is why we need you to drink 64 oz of fluid (preferably water/decaff fluids) 2 days prior to chemo and for up to 4-5 days after chemo. Drink more if you can. This will help to keep your kidneys flushed. This can cause mild hair loss, lower your platelets (which make your blood clot), lower your white blood cells (fight infection), and cause nausea/vomiting.    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 Side Effects that may occur during infusion include: chills, fever, headache, dizziness, shortness of breath, low blood pressure, rash. This does not generally happen. We will have to perform MUGA scans or 2D echoes every 12 weeks during treatment however because a side effect of this drug can be cardiotoxicity.   Neulasta - this is not chemo but being given to you because you have had chemo. This medication  works by stimulating your bone marrow which is inside of your bones to produce more white blood cells. White blood cells protect [name] from infection. Chemo destroys your cancer cells and white blood cells and that is why you need the Neulasta. This medication will be given to you by a nurse in the Cancer Center. It is given 20-24 hours from the completion of chemo. It comes in the form of an injection. It will be administered into the fatty tissue of your abdomen. The most common side effect associated with this injection is bone pain. The bone pain can occur as soon as a few hours after the injection to several days after the injection. Some people have mild bone pain and some have none. Few people experience severe bone pain. We want and need to know how you respond to this injection. If you develop moderate to severe bone pain - we need to know! The drug of choice to help relieve the bone pain is Aleve. Take as bottle directs. If another doctor has told you to avoid NSAIDs - like Aleve or Ibuprofen, then you may take Tylenol. You may also apply a heating pad to the affected areas.  POTENTIAL SIDE EFFECTS OF TREATMENT: Increased Susceptibility to Infection, Vomiting, Constipation, Hair Thinning, Changes in Character of Skin and Nails (brittleness, dryness,etc.), Pigment Changes (darkening of nail beds, palms of hands, soles of feet, etc.), Bone Marrow Suppression, Abdominal Cramping, Complete Hair Loss, Nausea, Diarrhea, Sun Sensitivity and Mouth Sores   SELF IMAGE NEEDS AND REFERRALS MADE: Obtain hair accessories as soon as possible (wigs,  scarves, turbans,caps,etc.) and Referral to Look Good, Feel Better consultant   EDUCATIONAL MATERIALS GIVEN AND REVIEWED: Chemotherapy and You  Specific Instructions Sheets: Taxotere, Carboplatin, Herceptin, Neulasta, Zofran, Dexamethasone, Compazine, Ativan, EMLA cream   SELF CARE ACTIVITIES WHILE ON CHEMOTHERAPY: Increase your fluid intake 48 hours prior to  treatment and drink at least 2 quarts per day after treatment., No alcohol intake., No aspirin or other medications unless approved by your oncologist., Eat foods that are light and easy to digest., Eat foods at cold or room temperature., No fried, fatty, or spicy foods immediately before or after treatment., Have teeth cleaned professionally before starting treatment. Keep dentures and partial plates clean., Use soft toothbrush and do not use mouthwashes that contain alcohol. Biotene is a good mouthwash that is available at most pharmacies or may be ordered by calling [phone]., Use warm salt water gargles (1 teaspoon salt per 1 quart warm water) before and after meals and at bedtime. Or you may rinse with 2 tablespoons of three -percent hydrogen peroxide mixed in eight ounces of water., Always use sunscreen with SPF (Sun Protection Factor) of 30 or higher., Use your nausea medication as directed to prevent nausea., Use your stool softener or laxative as directed to prevent constipation. and Use your anti-diarrheal medication as directed to stop diarrhea.  Please wash your hands for at least 30 seconds using warm soapy water. Handwashing is the #1 way to prevent the spread of germs. Stay away from sick people or people who are getting over a cold. If you develop respiratory systems such as green/yellow mucus production or productive cough or persistent cough let us know and we will see if you need an antibiotic. It is a good idea to keep a pair of gloves on when going into grocery stores/[employer] to decrease your risk of coming into contact with germs on the carts, etc. Carry alcohol hand gel with you at all times and use it frequently if out in public. All foods need to be cooked thoroughly. No raw foods. No medium or undercooked meats, eggs. If your food is cooked medium well, it does not need to be hot pink or saturated with bloody liquid at all. Vegetables and fruits need to be washed/rinsed under the  faucet with a dish detergent before being consumed. You can eat raw fruits and vegetables unless we tell you otherwise but it would be best if you cooked them or bought frozen. Do not eat off of salad bars or hot bars unless you really trust the cleanliness of the restaurant. If you need dental work, please let Dr. Mariel Sleet know before you go for your appointment so that we can coordinate the best possible time for you in regards to your chemo regimen. You need to also let your dentist know that you are actively taking chemo. We may need to do labs prior to your dental appointment. We also want your bowels moving at least every other day. If this is not happening, we need to know so that we can get you on a bowel regimen to help you go.   MEDICATIONS: You have been given prescriptions for the following medications:  Dexamethasone 4mg  tablet. The day before, day of, and day after chemo take 2 tablets in the am and 2 tablets in the pm. Then stop. Repeat this with each chemo cycle. Refer to your calendar. You are being given this medication to reduce your risk of developing fluid retention from your chemo Taxotere.  Zofran/ondansetron 8mg   tablet. Starting the day after chemo, take 1 tablet in the am and 1 tablet in the pm for 2 days. Then may take 1 tablet two times a day IF needed for nausea/vomiting.   Ativan/lorazepam 1mg  tablet. May take 1 tablet every 4 hours IF needed for nausea/vomiting. Do not drive while taking this medication because it can make you sleepy/drowsy. And this is a medication that shows up in your blood.   Compazine 25mg  suppository. May insert 1 suppository rectally every 6 hours IF needed for nausea/vomiting.   EMLA cream. Apply a quarter sized amount to port site 1 hour prior to chemo. Do not rub in. Cover with plastic.   Over-the-Counter Meds:  Colace - this is a stool softener. Take 100mg  capsule 2- 6 times a day as needed. If you have to take more than 6 capsules of Colace  a day call the Cancer Center.  Senna - this is a mild laxative used to treat mild constipation. May take 2 tabs by mouth daily or up to twice a day as needed for mild constipation.  Milk of Magnesia - this is a laxative used to treat moderate to severe constipation. May take 2-4 tablespoons every 8 hours as needed. May increase to 8 tablespoons x 1 dose and if no bowel movement call the Cancer Center.  Imodium - this is for diarrhea. Take 2 tabs after 1st loose stool and then 1 tab after each loose stool until you go a total of 12 hours without a loose stool. Call Cancer Center if loose stools continue.   SYMPTOMS TO REPORT AS SOON AS POSSIBLE AFTER TREATMENT:  FEVER GREATER THAN 100.5 F  CHILLS WITH OR WITHOUT FEVER  NAUSEA AND VOMITING THAT IS NOT CONTROLLED WITH YOUR NAUSEA MEDICATION  UNUSUAL SHORTNESS OF BREATH  UNUSUAL BRUISING OR BLEEDING  TENDERNESS IN MOUTH AND THROAT WITH OR WITHOUT PRESENCE OF ULCERS  URINARY PROBLEMS  BOWEL PROBLEMS  UNUSUAL RASH    Wear comfortable clothing and clothing appropriate for easy access to any Portacath or PICC line. Let us know if there is anything that we can do to make your therapy better!      I have been informed and understand all of the instructions given to me and have received a copy. I have been instructed to call the clinic [phone] or my family physician as soon as possible for continued medical care, if indicated. I do not have any more questions at this time but understand that I may call the Cancer Center or the Patient Navigator at [phone] during office hours should I have questions or need assistance in obtaining follow-up care.      _________________________________________      _______________     __________ Signature of Patient or Authorized Representative        Date                            Time      _________________________________________ Nurse's Signature

## 2012-10-16 ENCOUNTER — Encounter (HOSPITAL_COMMUNITY): Payer: Medicaid Other | Attending: Oncology

## 2012-10-16 ENCOUNTER — Telehealth (HOSPITAL_COMMUNITY): Payer: Self-pay | Admitting: Oncology

## 2012-10-16 DIAGNOSIS — C50919 Malignant neoplasm of unspecified site of unspecified female breast: Secondary | ICD-10-CM

## 2012-10-16 DIAGNOSIS — C773 Secondary and unspecified malignant neoplasm of axilla and upper limb lymph nodes: Secondary | ICD-10-CM

## 2012-10-16 NOTE — Progress Notes (Signed)
Hydrocodone 5/325 1-2 tablets q4-6h prn pain. #60 1 refill called in under [facility]. Msg left on their voicemail.

## 2012-10-16 NOTE — Telephone Encounter (Signed)
Confirmed w/ Dr. Aris Lot office that patient has an appointment tomorrow for surgical follow-up of biopsy right breast bx and eval. for port placement and second biopsy.

## 2012-10-16 NOTE — Progress Notes (Signed)
Chemo teaching done and consent signed for Carbo/Taxotere/Herceptin

## 2012-10-16 NOTE — Addendum Note (Signed)
Addended by: Oda Kilts on: 10/16/2012 12:27 PM   Modules accepted: Orders

## 2012-10-17 ENCOUNTER — Ambulatory Visit (HOSPITAL_COMMUNITY)
Admission: RE | Admit: 2012-10-17 | Discharge: 2012-10-17 | Disposition: A | Discharge status: Home / Self Care | Payer: Medicaid Other | Source: Ambulatory Visit | Attending: Oncology | Admitting: Oncology

## 2012-10-17 ENCOUNTER — Encounter (HOSPITAL_COMMUNITY): Payer: Self-pay | Admitting: Pharmacy Technician

## 2012-10-17 DIAGNOSIS — C50919 Malignant neoplasm of unspecified site of unspecified female breast: Secondary | ICD-10-CM

## 2012-10-17 NOTE — Progress Notes (Signed)
*  PRELIMINARY RESULTS* Echocardiogram 2D Echocardiogram has been performed.  Erika Nunez 10/17/2012, 10:52 AM

## 2012-10-18 ENCOUNTER — Inpatient Hospital Stay (HOSPITAL_COMMUNITY): Payer: Self-pay

## 2012-10-18 ENCOUNTER — Encounter (HOSPITAL_COMMUNITY): Payer: Self-pay

## 2012-10-18 ENCOUNTER — Encounter (HOSPITAL_COMMUNITY)
Admission: RE | Admit: 2012-10-18 | Discharge: 2012-10-18 | Disposition: A | Discharge status: Home / Self Care | Payer: Medicaid Other | Source: Ambulatory Visit | Attending: General Surgery | Admitting: General Surgery

## 2012-10-18 HISTORY — DX: Unspecified lump in the right breast, unspecified quadrant: N63.10

## 2012-10-18 HISTORY — DX: Unspecified lump in the left breast, unspecified quadrant: N63.20

## 2012-10-19 ENCOUNTER — Ambulatory Visit (HOSPITAL_COMMUNITY): Payer: Self-pay

## 2012-10-20 ENCOUNTER — Ambulatory Visit (HOSPITAL_COMMUNITY): Payer: Medicaid Other | Admitting: Anesthesiology

## 2012-10-20 ENCOUNTER — Encounter (HOSPITAL_COMMUNITY): Payer: Self-pay | Admitting: Anesthesiology

## 2012-10-20 ENCOUNTER — Other Ambulatory Visit (HOSPITAL_COMMUNITY): Payer: Self-pay

## 2012-10-20 ENCOUNTER — Ambulatory Visit (HOSPITAL_COMMUNITY): Payer: Medicaid Other

## 2012-10-20 ENCOUNTER — Encounter (HOSPITAL_COMMUNITY): Payer: Self-pay | Admitting: *Deleted

## 2012-10-20 ENCOUNTER — Ambulatory Visit (HOSPITAL_COMMUNITY)
Admission: RE | Admit: 2012-10-20 | Discharge: 2012-10-20 | Disposition: A | Discharge status: Home / Self Care | Payer: Medicaid Other | Source: Ambulatory Visit | Attending: General Surgery | Admitting: General Surgery

## 2012-10-20 ENCOUNTER — Encounter (HOSPITAL_COMMUNITY)
Admission: RE | Disposition: A | Discharge status: Home / Self Care | Payer: Self-pay | Source: Ambulatory Visit | Attending: General Surgery

## 2012-10-20 DIAGNOSIS — C50919 Malignant neoplasm of unspecified site of unspecified female breast: Secondary | ICD-10-CM | POA: Insufficient documentation

## 2012-10-20 DIAGNOSIS — N63 Unspecified lump in unspecified breast: Secondary | ICD-10-CM | POA: Insufficient documentation

## 2012-10-20 HISTORY — PX: PORTACATH PLACEMENT: SHX2246

## 2012-10-20 HISTORY — PX: BREAST BIOPSY: SHX20

## 2012-10-20 SURGERY — BREAST BIOPSY
Anesthesia: General | Site: Chest | Laterality: Left | Wound class: Clean

## 2012-10-20 MED ORDER — CHLORHEXIDINE GLUCONATE 4 % EX LIQD: 1.0000 "application " | Freq: Once | CUTANEOUS | Status: DC

## 2012-10-20 MED ORDER — SODIUM CHLORIDE 0.9 % IJ SOLN
INTRAMUSCULAR | Status: DC | PRN
  Administered 2012-10-20: 500 mL via INTRAVENOUS

## 2012-10-20 MED ORDER — MIDAZOLAM HCL 2 MG/2ML IJ SOLN
INTRAMUSCULAR | Status: AC
  Filled 2012-10-20: qty 2

## 2012-10-20 MED ORDER — ENOXAPARIN SODIUM 40 MG/0.4ML ~~LOC~~ SOLN
SUBCUTANEOUS | Status: AC
  Filled 2012-10-20: qty 0.4

## 2012-10-20 MED ORDER — ONDANSETRON HCL 4 MG/2ML IJ SOLN: 4.0000 mg | Freq: Once | INTRAMUSCULAR | Status: DC | PRN

## 2012-10-20 MED ORDER — FENTANYL CITRATE 0.05 MG/ML IJ SOLN: 25.0000 ug | INTRAMUSCULAR | Status: DC | PRN

## 2012-10-20 MED ORDER — CEFAZOLIN SODIUM-DEXTROSE 2-3 GM-% IV SOLR: 2.0000 g | INTRAVENOUS | Status: DC

## 2012-10-20 MED ORDER — LACTATED RINGERS IV SOLN
INTRAVENOUS | Status: DC
  Administered 2012-10-20: 10:00:00 via INTRAVENOUS

## 2012-10-20 MED ORDER — LIDOCAINE HCL (CARDIAC) 10 MG/ML IV SOLN
INTRAVENOUS | Status: DC | PRN
  Administered 2012-10-20: 50 mg via INTRAVENOUS

## 2012-10-20 MED ORDER — BUPIVACAINE HCL (PF) 0.5 % IJ SOLN
INTRAMUSCULAR | Status: DC | PRN
  Administered 2012-10-20: 15 mL

## 2012-10-20 MED ORDER — CELECOXIB 100 MG PO CAPS
ORAL_CAPSULE | ORAL | Status: AC
  Filled 2012-10-20: qty 4

## 2012-10-20 MED ORDER — LACTATED RINGERS IV SOLN
INTRAVENOUS | Status: DC | PRN
  Administered 2012-10-20: 10:00:00 via INTRAVENOUS

## 2012-10-20 MED ORDER — CEFAZOLIN SODIUM-DEXTROSE 2-3 GM-% IV SOLR
INTRAVENOUS | Status: AC
  Filled 2012-10-20: qty 50

## 2012-10-20 MED ORDER — CELECOXIB 100 MG PO CAPS
400.0000 mg | ORAL_CAPSULE | Freq: Every day | ORAL | Status: AC
  Administered 2012-10-20: 400 mg via ORAL

## 2012-10-20 MED ORDER — FENTANYL CITRATE 0.05 MG/ML IJ SOLN
INTRAMUSCULAR | Status: DC | PRN
  Administered 2012-10-20 (×4): 25 ug via INTRAVENOUS
  Administered 2012-10-20: 75 ug via INTRAVENOUS
  Administered 2012-10-20 (×7): 25 ug via INTRAVENOUS

## 2012-10-20 MED ORDER — MIDAZOLAM HCL 5 MG/5ML IJ SOLN
INTRAMUSCULAR | Status: DC | PRN
  Administered 2012-10-20 (×2): 1 mg via INTRAVENOUS

## 2012-10-20 MED ORDER — ENOXAPARIN SODIUM 40 MG/0.4ML ~~LOC~~ SOLN
40.0000 mg | Freq: Once | SUBCUTANEOUS | Status: AC
  Administered 2012-10-20: 40 mg via SUBCUTANEOUS

## 2012-10-20 MED ORDER — HEPARIN SODIUM (PORCINE) 1000 UNIT/ML IJ SOLN
INTRAMUSCULAR | Status: AC
  Filled 2012-10-20: qty 3

## 2012-10-20 MED ORDER — HEPARIN SODIUM (PORCINE) 1000 UNIT/ML IJ SOLN
INTRAMUSCULAR | Status: DC | PRN
  Administered 2012-10-20: 3000 [IU] via INTRAVENOUS

## 2012-10-20 MED ORDER — FENTANYL CITRATE 0.05 MG/ML IJ SOLN
INTRAMUSCULAR | Status: AC
  Filled 2012-10-20: qty 2

## 2012-10-20 MED ORDER — PROPOFOL 10 MG/ML IV EMUL
INTRAVENOUS | Status: AC
  Filled 2012-10-20: qty 20

## 2012-10-20 MED ORDER — FENTANYL CITRATE 0.05 MG/ML IJ SOLN
INTRAMUSCULAR | Status: AC
  Filled 2012-10-20: qty 5

## 2012-10-20 MED ORDER — PROPOFOL 10 MG/ML IV EMUL
INTRAVENOUS | Status: DC | PRN
  Administered 2012-10-20: 150 mg via INTRAVENOUS

## 2012-10-20 MED ORDER — CEFAZOLIN SODIUM-DEXTROSE 2-3 GM-% IV SOLR
INTRAVENOUS | Status: DC | PRN
  Administered 2012-10-20: 2 g via INTRAVENOUS

## 2012-10-20 MED ORDER — MIDAZOLAM HCL 2 MG/2ML IJ SOLN
1.0000 mg | INTRAMUSCULAR | Status: DC | PRN
  Administered 2012-10-20: 2 mg via INTRAVENOUS

## 2012-10-20 MED ORDER — LIDOCAINE HCL (PF) 1 % IJ SOLN
INTRAMUSCULAR | Status: AC
  Filled 2012-10-20: qty 5

## 2012-10-20 MED ORDER — HYDROCODONE-ACETAMINOPHEN 5-325 MG PO TABS: 1.0000 | ORAL_TABLET | ORAL | Status: DC | PRN

## 2012-10-20 MED ORDER — BUPIVACAINE HCL (PF) 0.5 % IJ SOLN
INTRAMUSCULAR | Status: AC
  Filled 2012-10-20: qty 30

## 2012-10-20 SURGICAL SUPPLY — 43 items
APPLIER CLIP 9.375 SM OPEN (CLIP)
BAG DECANTER FOR FLEXI CONT (MISCELLANEOUS) ×3 IMPLANT
BAG HAMPER (MISCELLANEOUS) ×3 IMPLANT
BENZOIN TINCTURE PRP APPL 2/3 (GAUZE/BANDAGES/DRESSINGS) ×6 IMPLANT
CATH HICKMAN DUAL 12.0 (CATHETERS) IMPLANT
CLIP APPLIE 9.375 SM OPEN (CLIP) IMPLANT
CLOTH BEACON ORANGE TIMEOUT ST (SAFETY) ×3 IMPLANT
COVER LIGHT HANDLE STERIS (MISCELLANEOUS) ×6 IMPLANT
DECANTER SPIKE VIAL GLASS SM (MISCELLANEOUS) ×3 IMPLANT
DRAPE C-ARM FOLDED MOBILE STRL (DRAPES) ×3 IMPLANT
DURAPREP 26ML APPLICATOR (WOUND CARE) ×3 IMPLANT
DURAPREP 6ML APPLICATOR 50/CS (WOUND CARE) ×3 IMPLANT
ELECT REM PT RETURN 9FT ADLT (ELECTROSURGICAL) ×3
ELECTRODE REM PT RTRN 9FT ADLT (ELECTROSURGICAL) ×2 IMPLANT
FORMALIN 10 PREFIL 120ML (MISCELLANEOUS) ×3 IMPLANT
GLOVE BIOGEL PI IND STRL 7.0 (GLOVE) ×4 IMPLANT
GLOVE BIOGEL PI IND STRL 7.5 (GLOVE) ×2 IMPLANT
GLOVE BIOGEL PI INDICATOR 7.0 (GLOVE) ×2
GLOVE BIOGEL PI INDICATOR 7.5 (GLOVE) ×1
GLOVE ECLIPSE 6.5 STRL STRAW (GLOVE) ×3 IMPLANT
GLOVE ECLIPSE 7.0 STRL STRAW (GLOVE) ×3 IMPLANT
GOWN STRL REIN XL XLG (GOWN DISPOSABLE) ×6 IMPLANT
IV NS 500ML (IV SOLUTION) ×1
IV NS 500ML BAXH (IV SOLUTION) ×2 IMPLANT
KIT PORT POWER 8FR ISP MRI (CATHETERS) ×3 IMPLANT
KIT ROOM TURNOVER APOR (KITS) ×3 IMPLANT
MANIFOLD NEPTUNE II (INSTRUMENTS) ×3 IMPLANT
NEEDLE HYPO 18GX1.5 BLUNT FILL (NEEDLE) ×3 IMPLANT
NEEDLE HYPO 25X1 1.5 SAFETY (NEEDLE) ×3 IMPLANT
NS IRRIG 1000ML POUR BTL (IV SOLUTION) ×3 IMPLANT
PACK MINOR (CUSTOM PROCEDURE TRAY) ×3 IMPLANT
PAD ARMBOARD 7.5X6 YLW CONV (MISCELLANEOUS) ×3 IMPLANT
SET BASIN LINEN APH (SET/KITS/TRAYS/PACK) ×3 IMPLANT
SET INTRODUCER 12FR PACEMAKER (SHEATH) IMPLANT
SHEATH COOK PEEL AWAY SET 8F (SHEATH) IMPLANT
SPONGE GAUZE 2X2 8PLY STRL LF (GAUZE/BANDAGES/DRESSINGS) IMPLANT
STRIP CLOSURE SKIN 1/2X4 (GAUZE/BANDAGES/DRESSINGS) ×9 IMPLANT
SUT MNCRL AB 4-0 PS2 18 (SUTURE) ×6 IMPLANT
SUT VIC AB 3-0 SH 27 (SUTURE) ×1
SUT VIC AB 3-0 SH 27X BRD (SUTURE) ×2 IMPLANT
SYR BULB IRRIGATION 50ML (SYRINGE) ×3 IMPLANT
SYR CONTROL 10ML LL (SYRINGE) ×3 IMPLANT
SYRINGE 10CC LL (SYRINGE) ×3 IMPLANT

## 2012-10-20 NOTE — Transfer of Care (Signed)
  Anesthesia Post-op Note  Patient: Erika Nunez  Procedure(s) Performed: Procedure(s) (LRB) with comments: BREAST BIOPSY (Left) - Procedure end 1142 INSERTION PORT-A-CATH (Left) - Procedure began 1143  Patient Location: PACU  Anesthesia Type: General  Level of Consciousness: awake, alert , oriented and patient cooperative  Airway and Oxygen Therapy: Patient Spontanous Breathing and Patient connected to face mask oxygen  Post-op Pain: mild  Post-op Assessment: Post-op Vital signs reviewed, Patient's Cardiovascular Status Stable, Respiratory Function Stable, Patent Airway and No signs of Nausea or vomiting  Post-op Vital Signs: Reviewed and stable  Complications: No apparent anesthesia complications  

## 2012-10-20 NOTE — Preoperative (Signed)
Beta Blockers   Reason not to administer Beta Blockers:Not Applicable 

## 2012-10-20 NOTE — Interval H&P Note (Signed)
History and Physical Interval Note:  10/20/2012 10:34 AM  Erika Nunez  has presented today for surgery, with the diagnosis of Left Breast Mass  The various methods of treatment have been discussed with the patient and family. After consideration of risks, benefits and other options for treatment, the patient has consented to  Procedure(s) (LRB) with comments: BREAST BIOPSY (Left) INSERTION PORT-A-CATH (N/A) as a surgical intervention .  The patient's history has been reviewed, patient examined, no change in status, stable for surgery.  I have reviewed the patient's chart and labs.  Questions were answered to the patient's satisfaction.     Corine Solorio C

## 2012-10-20 NOTE — H&P (View-Only) (Signed)
Erika Nunez is an 62 y.o. female.   Chief Complaint: Right breast abscess HPI: Patient presented to the emergency department with discomfort and drainage from the right breast. She states over the last 6 months she has noted a change within the breast. Over the last 6 weeks this is increased in she's had increased drainage and odor. She is being clean the area with peroxide and soap and water at least daily over this period of time. He has continued to progress. She denies any significant fevers or chills. She has had approximately 10 pound weight loss over the last month to 2. She states she had a previous cyst in this area several years ago which resolved spontaneously. She didn't notice another cyst in the similar area. This nodule did increase in size somewhat. She had no biopsy previously. No history of mammogram or ultrasound. No family history of breast cancer. No ovarian or uterine cancer in the family.  Past Medical History  Diagnosis Date  . Hypertension   . High cholesterol     Past Surgical History  Procedure Date  . Cholecystectomy   . Tubal ligation   . Tonsillectomy     No family history on file. Social History:  reports that she has never smoked. She does not have any smokeless tobacco history on file. She reports that she does not drink alcohol or use illicit drugs.  Allergies:  Allergies  Allergen Reactions  . Codeine Hives    Medications Prior to Admission  Medication Sig Dispense Refill  . ibuprofen (ADVIL,MOTRIN) 200 MG tablet Take 200 mg by mouth every 6 (six) hours as needed. Pain.      Marland Kitchen lisinopril (PRINIVIL,ZESTRIL) 20 MG tablet Take 20 mg by mouth daily.      Marland Kitchen lisinopril-hydrochlorothiazide (PRINZIDE,ZESTORETIC) 20-25 MG per tablet Take 1 tablet by mouth daily.      . pravastatin (PRAVACHOL) 40 MG tablet Take 80 mg by mouth at bedtime.      . sertraline (ZOLOFT) 100 MG tablet Take 150 mg by mouth daily.        Results for orders placed during the  hospital encounter of 10/01/12 (from the past 48 hour(s))  CBC WITH DIFFERENTIAL     Status: Abnormal   Collection Time   10/01/12 11:07 AM      Component Value Range Comment   WBC 14.0 (*) 4.0 - 10.5 K/uL    RBC 4.04  3.87 - 5.11 MIL/uL    Hemoglobin 11.8 (*) 12.0 - 15.0 g/dL    HCT 40.9 (*) 81.1 - 46.0 %    MCV 86.9  78.0 - 100.0 fL    MCH 29.2  26.0 - 34.0 pg    MCHC 33.6  30.0 - 36.0 g/dL    RDW 91.4  78.2 - 95.6 %    Platelets 565 (*) 150 - 400 K/uL    Neutrophils Relative 80 (*) 43 - 77 %    Neutro Abs 11.1 (*) 1.7 - 7.7 K/uL    Lymphocytes Relative 11 (*) 12 - 46 %    Lymphs Abs 1.6  0.7 - 4.0 K/uL    Monocytes Relative 8  3 - 12 %    Monocytes Absolute 1.1 (*) 0.1 - 1.0 K/uL    Eosinophils Relative 1  0 - 5 %    Eosinophils Absolute 0.1  0.0 - 0.7 K/uL    Basophils Relative 0  0 - 1 %    Basophils Absolute 0.0  0.0 -  0.1 K/uL   BASIC METABOLIC PANEL     Status: Abnormal   Collection Time   10/01/12 11:07 AM      Component Value Range Comment   Sodium 131 (*) 135 - 145 mEq/L    Potassium 3.1 (*) 3.5 - 5.1 mEq/L    Chloride 92 (*) 96 - 112 mEq/L    CO2 27  19 - 32 mEq/L    Glucose, Bld 107 (*) 70 - 99 mg/dL    BUN 9  6 - 23 mg/dL    Creatinine, Ser 4.40 (*) 0.50 - 1.10 mg/dL    Calcium 9.9  8.4 - 34.7 mg/dL    GFR calc non Af Amer 47 (*) >90 mL/min    GFR calc Af Amer 54 (*) >90 mL/min    No results found.  Review of Systems  Constitutional: Positive for weight loss. Negative for fever and chills.  HENT: Negative.   Eyes: Negative.   Respiratory: Negative.   Cardiovascular: Positive for chest pain.  Gastrointestinal: Negative.   Genitourinary: Negative.   Musculoskeletal: Negative.   Skin:       Drainage for the last 6 weeks in right breast. Redness in thickness.  Neurological: Negative.  Negative for weakness.  Endo/Heme/Allergies: Negative.   Psychiatric/Behavioral: Negative.     Blood pressure 113/58, pulse 88, temperature 97.8 F (36.6 C), temperature  source Oral, resp. rate 20, height 5\' 8"  (1.727 m), weight 68.04 kg (150 lb), SpO2 98.00%. Physical Exam  Constitutional: She is oriented to person, place, and time. She appears well-developed and well-nourished. No distress.  HENT:  Head: Normocephalic and atraumatic.  Eyes: Conjunctivae normal and EOM are normal. Pupils are equal, round, and reactive to light.  Neck: Normal range of motion. Neck supple. No tracheal deviation present. No thyromegaly present.  Cardiovascular: Normal rate, regular rhythm and normal heart sounds.   Respiratory: Effort normal and breath sounds normal.  GI: Soft. Bowel sounds are normal. She exhibits no distension. There is no tenderness.  Musculoskeletal: Normal range of motion.  Lymphadenopathy:    She has no cervical adenopathy.  Neurological: She is alert and oriented to person, place, and time.  Skin:       Right breast: The majority of the inferior and lateral breast is thickened. There is necrosis with sloughing along the lower outer quadrant. There is significant discharge. There is significant odor.     Assessment/Plan Necrotic breast mass of the right breast. I am significantly concerned regarding the findings. 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 Have discussed with the patient attempting to biopsy this tomorrow in the operating room in attempt to clean some necrotic tissue. Oncology consult will also be obtained in order to expedite any neoadjuvant treatment. Continue IV antibiotics. Continue local wound care.  Keil Pickering C 10/01/2012, 2:32 PM

## 2012-10-20 NOTE — Anesthesia Procedure Notes (Signed)
Procedure Name: MAC Date/Time: 10/20/2012 10:55 AM Performed by: Carolyne Littles, AMY L Pre-anesthesia Checklist: Patient identified, Patient being monitored, Emergency Drugs available, Timeout performed and Suction available Patient Re-evaluated:Patient Re-evaluated prior to inductionOxygen Delivery Method: Circle system utilized Preoxygenation: Pre-oxygenation with 100% oxygen Intubation Type: IV induction Ventilation: Mask ventilation without difficulty LMA: LMA inserted LMA Size: 4.0 Number of attempts: 1 Placement Confirmation: positive ETCO2 and breath sounds checked- equal and bilateral Tube secured with: Tape Dental Injury: Teeth and Oropharynx as per pre-operative assessment

## 2012-10-20 NOTE — Op Note (Signed)
Patient:  Erika Nunez  DOB:  1951/02/08  MRN:  [id number]    Preop Diagnosis:  Breast cancer, left breast mass  Postop Diagnosis:  The same  Procedure:  #1 excisional biopsy of left breast mass, #2 Port-A-Cath insertion, #3 intraoperative fluoroscopy with surgeon interpretation  Surgeon:  Dr. Tilford Pillar  Anes:  General endotracheal, and 1% Sensorcaine plain for local  Indications:  Patient is a 62 year old female well known to me with a history of a necrotizing right-sided breast mass/breast cancer. She is been seen by oncology and plans are to start treatment. Port-A-Cath was recommended. Additionally questions regarding a left-sided breast mass more raised as to whether this is a metastasis from the right side or a second primary. Risks benefits alternatives of excisional biopsy as well as Port-A-Cath insertion were discussed at length patient including but not limited to risk of bleeding, infection, pneumothorax, skin dehiscence, intraoperative cardiac and pulmonary events. Her questions and concerns are addressed the patient was consented for the planned procedure.  Procedure note:  Patient is taken to the operating room was placed in a supine position the or table time the general anesthetic is a Optician, dispensing. Once patient was asleep she is in endotracheally intubated by the nurse anesthetist. At this point her left breast and chest wall was prepped with DuraPrep solution and draped in standard fashion. Initially I focused on the excisional biopsy as pathology was waiting for frozen section. A semilunar incision was created around the areola with a 15 blade scalpel. Additional dissection down to subcuticular tissues carried out using electrocautery. Masses easily palpated and was carefully dissected circumferentially around using the electrocautery. Hemostasis was obtained during the dissection with electrocautery. The dissection was carried down to the pectoralis muscle. The mass was  elevated off of the pectoralis muscle and once free circumferentially was sent as a test and pathology for frozen section. The wound was irrigated. Hemostasis is excellent. At this time her my attention to closure of the breast incision. A 3-0 Vicryl was utilized to reapproximate the deep subcuticular tissue. A 4-0 Monocryl utilized reapproximate the skin edges in running subcuticular suture. The skin is washed and dried and covered with a moist gauze sponge.  At this time the patient was placed into a Trendelenburg position. The clavicle was identified. Local anesthetic was instilled along the planned course of venipuncture. An 18-gauge introducer needle was utilized to identify the left subclavian vein. Good venous return was obtained. J-wire was advanced and was shown to course into the superior vena cava with fluoroscopy. At this point the J-wire was clamped to the drape and the subcuticular pocket was created. Again local anesthetic was instilled. A 15 blade scalpel utilized to create the skin incision. The pocket was enlarged combination of blunt and electrocautery dissection. Once adequately sized the catheter was advanced to the subcuticular tissue using a tunneling device from the pocket site to the stab incision site. The insertion and dilator sheath combination was then advanced down the J-wire. This was done under fluoroscopy to confirm easy transition of the sheath down the J-wire. The sheath move smoothly. The dilator and J-wire were removed in standard fashion. The catheter was advanced down the sheath and once confirmed in the superior vena cava the sheath was split and removed in standard fashion. Confirming that the catheter tip was in the superior vena cava the catheter was trimmed at 20 1/2 cm. The catheter was connected to the port which was placed into the subcutaneous pocket. The port  was aspirated with good venous return. The port was irrigated with 3000 units this heparin. The course of  the catheter was inspected with fluoroscopy and there is no sharp angulation or kinking noted. At this time the pocket was closed using a 3-0 Vicryl to reapproximate the deep subcuticular tissue. A 4-0 Monocryl utilized reapproximate the skin edges in a running subcuticular suture. The skin was washed dried moist dry towel all 3 incision sites. Benzoin is applied around all 3 incisions. Half-inch are suture placed over all 3 incisions. The drapes removed. Patient was allowed to come out of general anesthetic and was transferred to the PACU in stable condition. At the conclusion of procedure all instrument, sponge, needle counts are correct. Patient tolerated procedure extremely well  Complications:  None apparent. Portable chest x-ray is pending in the PACU.  EBL:  Less than 50 ML's  Specimen:  Left breast mass

## 2012-10-20 NOTE — Anesthesia Postprocedure Evaluation (Signed)
  Anesthesia Post-op Note  Patient: Erika Nunez  Procedure(s) Performed: Procedure(s) (LRB) with comments: BREAST BIOPSY (Left) - Procedure end 1142 INSERTION PORT-A-CATH (Left) - Procedure began 1143  Patient Location: PACU  Anesthesia Type: General  Level of Consciousness: awake, alert , oriented and patient cooperative  Airway and Oxygen Therapy: Patient Spontanous Breathing and Patient connected to face mask oxygen  Post-op Pain: mild  Post-op Assessment: Post-op Vital signs reviewed, Patient's Cardiovascular Status Stable, Respiratory Function Stable, Patent Airway and No signs of Nausea or vomiting  Post-op Vital Signs: Reviewed and stable  Complications: No apparent anesthesia complications

## 2012-10-20 NOTE — Anesthesia Preprocedure Evaluation (Addendum)
Anesthesia Evaluation  Patient identified by MRN, date of birth, ID band Patient awake    Reviewed: Allergy & Precautions, H&P , NPO status , Patient's Chart, lab work & pertinent test results  History of Anesthesia Complications Negative for: history of anesthetic complications  Airway Mallampati: II      Dental  (+) Edentulous Upper and Edentulous Lower   Pulmonary neg pulmonary ROS,  breath sounds clear to auscultation        Cardiovascular hypertension, Pt. on medications Rhythm:Regular Rate:Normal     Neuro/Psych    GI/Hepatic negative GI ROS,   Endo/Other    Renal/GU      Musculoskeletal   Abdominal   Peds  Hematology   Anesthesia Other Findings   Reproductive/Obstetrics                           Anesthesia Physical Anesthesia Plan  ASA: II  Anesthesia Plan: General   Post-op Pain Management:    Induction: Intravenous  Airway Management Planned: LMA  Additional Equipment:   Intra-op Plan:   Post-operative Plan: Extubation in OR  Informed Consent: I have reviewed the patients History and Physical, chart, labs and discussed the procedure including the risks, benefits and alternatives for the proposed anesthesia with the patient or authorized representative who has indicated his/her understanding and acceptance.     Plan Discussed with:   Anesthesia Plan Comments:         Anesthesia Quick Evaluation

## 2012-10-20 NOTE — Interval H&P Note (Signed)
              Post Operative Follow Up - 10/17/2012  Patient Name: Erika Nunez Date of Birth: 07-13-1951  Vital Signs:  Insert Vitals as of: 10/17/2012: Systolic 132: Diastolic 71: Heart Rate 95: Temp 36.5C: Height 168CM: Weight 89.81KG: Pain Level 5: BMI 32   BMI: 31.96 kg/m2  Subjective: This 62 Years 49 Months old Female presents for surgery followup.  Breast cancer Patient   has no  significant complaints. Wound is relatively clean.  Minimal drainage.  No pain.  No fever or chills.  patient has seen Dr. Mariel Sleet and plans are to proceed with treatments.  Patient advised to have port placed.  Dr. Mariel Sleet also requesting bx of left breast mass to define whether a second primary or met.     Social History:   Social History  Preferred Language: English Race:  White Ethnicity: Other Age: 62 Years 4 Months       Allergies:  Allergies Insert Code: No allergies found.     Objective: General: Well appearing, well nourished in no distress. Right breast.  Open wound.  No significant odor.  minimal sloughing tissue.   Assessment:     PO [id number] stable  Plan:  Breast cancer.  Discussed port with patient.  Will place ASAP.  Also discussed options for bx.  Will proceed with surgical bx at time of port.    Follow-up:Pending Surgery

## 2012-10-23 ENCOUNTER — Encounter (HOSPITAL_COMMUNITY): Payer: Self-pay | Admitting: Oncology

## 2012-10-23 ENCOUNTER — Encounter (HOSPITAL_BASED_OUTPATIENT_CLINIC_OR_DEPARTMENT_OTHER): Payer: Medicaid Other | Admitting: Oncology

## 2012-10-23 ENCOUNTER — Inpatient Hospital Stay (HOSPITAL_COMMUNITY): Payer: Self-pay

## 2012-10-23 ENCOUNTER — Encounter (HOSPITAL_BASED_OUTPATIENT_CLINIC_OR_DEPARTMENT_OTHER): Payer: Medicaid Other

## 2012-10-23 VITALS — BP 136/64 | HR 85 | Temp 97.6°F | Resp 18 | Wt 197.2 lb

## 2012-10-23 DIAGNOSIS — Z170 Estrogen receptor positive status [ER+]: Secondary | ICD-10-CM

## 2012-10-23 DIAGNOSIS — C773 Secondary and unspecified malignant neoplasm of axilla and upper limb lymph nodes: Secondary | ICD-10-CM

## 2012-10-23 DIAGNOSIS — C50919 Malignant neoplasm of unspecified site of unspecified female breast: Secondary | ICD-10-CM

## 2012-10-23 DIAGNOSIS — Z5112 Encounter for antineoplastic immunotherapy: Secondary | ICD-10-CM

## 2012-10-23 DIAGNOSIS — Z5111 Encounter for antineoplastic chemotherapy: Secondary | ICD-10-CM

## 2012-10-23 DIAGNOSIS — F329 Major depressive disorder, single episode, unspecified: Secondary | ICD-10-CM

## 2012-10-23 LAB — CBC WITH DIFFERENTIAL/PLATELET
Basophils Relative: 0 % (ref 0–1)
Eosinophils Absolute: 0 10*3/uL (ref 0.0–0.7)
Eosinophils Relative: 0 % (ref 0–5)
Hemoglobin: 11.1 g/dL — ABNORMAL LOW (ref 12.0–15.0)
MCH: 29.4 pg (ref 26.0–34.0)
MCHC: 33.7 g/dL (ref 30.0–36.0)
Monocytes Absolute: 0.9 10*3/uL (ref 0.1–1.0)
Monocytes Relative: 5 % (ref 3–12)
Neutrophils Relative %: 90 % — ABNORMAL HIGH (ref 43–77)

## 2012-10-23 LAB — COMPREHENSIVE METABOLIC PANEL
Albumin: 3.5 g/dL (ref 3.5–5.2)
BUN: 19 mg/dL (ref 6–23)
Calcium: 10.1 mg/dL (ref 8.4–10.5)
Creatinine, Ser: 1.08 mg/dL (ref 0.50–1.10)
Potassium: 3.5 mEq/L (ref 3.5–5.1)
Total Protein: 7.5 g/dL (ref 6.0–8.3)

## 2012-10-23 LAB — CANCER ANTIGEN 27.29: CA 27.29: 57 U/mL — ABNORMAL HIGH (ref 0–39)

## 2012-10-23 MED ORDER — HEPARIN SOD (PORK) LOCK FLUSH 100 UNIT/ML IV SOLN
500.0000 [IU] | Freq: Once | INTRAVENOUS | Status: AC | PRN
  Administered 2012-10-23: 500 [IU]
  Filled 2012-10-23: qty 5

## 2012-10-23 MED ORDER — DEXTROSE 5 % IV SOLN
75.0000 mg/m2 | Freq: Once | INTRAVENOUS | Status: AC
  Administered 2012-10-23: 150 mg via INTRAVENOUS
  Filled 2012-10-23: qty 15

## 2012-10-23 MED ORDER — SODIUM CHLORIDE 0.9 % IV SOLN
Freq: Once | INTRAVENOUS | Status: AC
  Administered 2012-10-23: 16 mg via INTRAVENOUS
  Filled 2012-10-23: qty 8

## 2012-10-23 MED ORDER — HEPARIN SOD (PORK) LOCK FLUSH 100 UNIT/ML IV SOLN
INTRAVENOUS | Status: AC
  Filled 2012-10-23: qty 5

## 2012-10-23 MED ORDER — SODIUM CHLORIDE 0.9 % IV SOLN
568.8000 mg | Freq: Once | INTRAVENOUS | Status: AC
  Administered 2012-10-23: 570 mg via INTRAVENOUS
  Filled 2012-10-23: qty 57

## 2012-10-23 MED ORDER — DIPHENHYDRAMINE HCL 25 MG PO CAPS
50.0000 mg | ORAL_CAPSULE | Freq: Once | ORAL | Status: AC
  Administered 2012-10-23: 50 mg via ORAL

## 2012-10-23 MED ORDER — DIPHENHYDRAMINE HCL 25 MG PO CAPS
ORAL_CAPSULE | ORAL | Status: AC
  Filled 2012-10-23: qty 2

## 2012-10-23 MED ORDER — ACETAMINOPHEN 325 MG PO TABS
650.0000 mg | ORAL_TABLET | Freq: Once | ORAL | Status: AC
  Administered 2012-10-23: 650 mg via ORAL

## 2012-10-23 MED ORDER — SODIUM CHLORIDE 0.9 % IJ SOLN
10.0000 mL | INTRAMUSCULAR | Status: DC | PRN
  Filled 2012-10-23: qty 10

## 2012-10-23 MED ORDER — SODIUM CHLORIDE 0.9 % IV SOLN
Freq: Once | INTRAVENOUS | Status: AC
  Administered 2012-10-23: 09:00:00 via INTRAVENOUS

## 2012-10-23 MED ORDER — ACETAMINOPHEN 325 MG PO TABS
ORAL_TABLET | ORAL | Status: AC
  Filled 2012-10-23: qty 2

## 2012-10-23 MED ORDER — SODIUM CHLORIDE 0.9 % IV SOLN: 16.0000 mg | Freq: Once | INTRAVENOUS | Status: DC

## 2012-10-23 MED ORDER — TRASTUZUMAB CHEMO INJECTION 440 MG
8.0000 mg/kg | Freq: Once | INTRAVENOUS | Status: AC
  Administered 2012-10-23: 735 mg via INTRAVENOUS
  Filled 2012-10-23: qty 35

## 2012-10-23 MED ORDER — DEXAMETHASONE SODIUM PHOSPHATE 10 MG/ML IJ SOLN: 20.0000 mg | Freq: Once | INTRAMUSCULAR | Status: DC

## 2012-10-23 MED FILL — Sodium Chloride IV Soln 0.9%: INTRAVENOUS | Qty: 500 | Status: AC

## 2012-10-23 NOTE — Patient Instructions (Addendum)
[facility] Discharge Instructions  RECOMMENDATIONS MADE BY THE CONSULTANT AND ANY TEST RESULTS WILL BE SENT TO YOUR REFERRING PHYSICIAN.  EXAM FINDINGS BY THE PHYSICIAN TODAY AND SIGNS OR SYMPTOMS TO REPORT TO CLINIC OR PRIMARY PHYSICIAN: Exam and discussion by PA.  MEDICATIONS PRESCRIBED:  none  INSTRUCTIONS GIVEN AND DISCUSSED: Report fevers, chills, uncontrolled nausea or vomiting, or other problems  SPECIAL INSTRUCTIONS/FOLLOW-UP: Follow-up in 3 weeks prior to next chemotherapy treatment.  Thank you for choosing [facility] to provide your oncology and hematology care.  To afford each patient quality time with our providers, please arrive at least 15 minutes before your scheduled appointment time.  With your help, our goal is to use those 15 minutes to complete the necessary work-up to ensure our physicians have the information they need to help with your evaluation and healthcare recommendations.    Effective January 1st, 2014, we ask that you re-schedule your appointment with our physicians should you arrive 10 or more minutes late for your appointment.  We strive to give you quality time with our providers, and arriving late affects you and other patients whose appointments are after yours.    Again, thank you for choosing [facility].  Our hope is that these requests will decrease the amount of time that you wait before being seen by our physicians.       _____________________________________________________________  Should you have questions after your visit to [facility], please contact our office at [phone] between the hours of 8:30 a.m. and 5:00 p.m.  Voicemails left after 4:30 p.m. will not be returned until the following business day.  For prescription refill requests, have your pharmacy contact our office with your prescription refill request.

## 2012-10-23 NOTE — Progress Notes (Signed)
taxotere started at 1215 and titrated per policy. Tolerated chemo without problems.

## 2012-10-23 NOTE — Progress Notes (Signed)
No primary provider on file. No primary provider on file.  1. Breast cancer     CURRENT THERAPY: Embarking on [facility] therapy today (10/23/2012)  INTERVAL HISTORY: Erika Nunez 62 y.o. female returns for  regular  visit for followup of  Stage IIIC/Stage IV, Her2+, ER-, PR-, KI-67 70%, breast cancer. Starting systemic chemotherapy consisting of [facility] 10/23/2012.  Erika Nunez is here embarking on her first cycle of chemotherapy.    I personally reviewed and went over radiographic studies with the patient.  Her PET scan shows that her right breast cancer is erupting through the skin surface with nodular tumor involvement of the right pectoralis muscle with extension through the chest wall to the pleural surface adjacent to the sternum.  There is multiple metastatic right axillary lymph nodes and a hypermetabolic breast in the left breast consistent with breast carcinoma with hypermetabolic left axillary nodes consistent with local metastatic nodal metastasis.  We spent some time discussing her chemotherapy regimen.  We hope to administer 8 cycles of chemotherapy.  Depending on her response, we hope to get her to surgery.  We spent some time discussing side effects of therapy including alopecia, nausea and vomiting, LE discomfort, bone pain, lower of blood counts, and increased risk of infection.   Today, she denies any complaints.  I looked at her right breast open wound and recommended that she continue to pack it with packing material she was given by Dr. Leticia Penna.    She denies any fevers or shaking chills.    Past Medical History  Diagnosis Date  . Hypertension   . High cholesterol   . Cancer   . Breast cancer   . Allergy     seasonal allergies  . Arthritis   . Anxiety   . Breast mass, left 2014  . Breast mass, right 2014    has Breast cancer on her problem list.     is allergic to codeine.  Erika Nunez does not currently have medications on file.  Past Surgical History  Procedure  Laterality Date  . Cholecystectomy    . Tubal ligation    . Tonsillectomy    . Breast biopsy  10/02/2012    Procedure: BREAST BIOPSY;  Surgeon: Fabio Bering, MD;  Location: [facility];  Service: General;  Laterality: Right;  . Portacath placement Left 10/20/2012  . Breast biopsy Left 10/20/2012    Procedure: BREAST BIOPSY;  Surgeon: Fabio Bering, MD;  Location: [facility];  Service: General;  Laterality: Left;  Procedure end 1142  . Portacath placement Left 10/20/2012    Procedure: INSERTION PORT-A-CATH;  Surgeon: Fabio Bering, MD;  Location: [facility];  Service: General;  Laterality: Left;  Procedure began 1143; left subclavian    Denies any headaches, dizziness, double vision, fevers, chills, night sweats, nausea, vomiting, diarrhea, constipation, chest pain, heart palpitations, shortness of breath, blood in stool, black tarry stool, urinary pain, urinary burning, urinary frequency, hematuria.   PHYSICAL EXAMINATION  ECOG PERFORMANCE STATUS: 1 - Symptomatic but completely ambulatory  There were no vitals filed for this visit.  GENERAL:alert, no distress, comfortable, cooperative, obese and smiling SKIN: skin color, texture, turgor are normal, no rashes or significant lesions HEAD: Normocephalic, No masses, lesions, tenderness or abnormalities EYES: normal, Conjunctiva are pink and non-injected EARS: External ears normal OROPHARYNX:mucous membranes are moist  NECK: supple, trachea midline LYMPH:  not examined BREAST:right breast is diffusely erythematous with a 2 x 2 cm open area that is draining white-milky material.  LUNGS: clear to auscultation and percussion HEART: regular rate & rhythm, no murmurs, no gallops, S1 normal and S2 normal ABDOMEN:abdomen soft, non-tender, obese and normal bowel sounds BACK: Back symmetric, no curvature. EXTREMITIES:less then 2 second capillary refill, no skin discoloration, no cyanosis  NEURO: alert & oriented x 3 with fluent speech    LABORATORY  DATA: CBC    Component Value Date/Time   WBC 17.4* 10/23/2012 0937   RBC 3.78* 10/23/2012 0937   HGB 11.1* 10/23/2012 0937   HCT 32.9* 10/23/2012 0937   PLT 451* 10/23/2012 0937   MCV 87.0 10/23/2012 0937   MCH 29.4 10/23/2012 0937   MCHC 33.7 10/23/2012 0937   RDW 14.2 10/23/2012 0937   LYMPHSABS 0.8 10/23/2012 0937   MONOABS 0.9 10/23/2012 0937   EOSABS 0.0 10/23/2012 0937   BASOSABS 0.0 10/23/2012 0937      Chemistry      Component Value Date/Time   NA 135 10/23/2012 0937   K 3.5 10/23/2012 0937   CL 98 10/23/2012 0937   CO2 23 10/23/2012 0937   BUN 19 10/23/2012 0937   CREATININE 1.08 10/23/2012 0937      Component Value Date/Time   CALCIUM 10.1 10/23/2012 0937   ALKPHOS 91 10/23/2012 0937   AST 16 10/23/2012 0937   ALT 19 10/23/2012 0937   BILITOT 0.2* 10/23/2012 0937     Lab Results  Component Value Date   LABCA2 50* 10/03/2012      RADIOGRAPHIC STUDIES:  10/12/2012  *RADIOLOGY REPORT*  Clinical Data: Initial treatment strategy for breast carcinoma.  Large right breast mass.  NUCLEAR MEDICINE PET SKULL BASE TO THIGH  Fasting Blood Glucose: 99  Technique: 19.9 mCi F-18 FDG was injected intravenously. CT data  was obtained and used for attenuation correction and anatomic  localization only. (This was not acquired as a diagnostic CT  examination.) Additional exam technical data entered on  technologist worksheet.  Comparison: CT 01/20 1014  Findings:  Neck: No hypermetabolic lymph nodes in the neck.  Chest: Within the right breast, there is a large hypermetabolic  mass erupting through the skin surface measuring 9.5 x 8.9 cm with  SUV max = 13.1. There is nodular hypermetabolic extension into the  deep tissues of the anterior right chest wall with extensive  involvement of the pectoralis muscle. There is high concern for  extension through the chest wall to the level of the pleural  surface just lateral to the sternum (image 91).  There is matted intense hypermetabolic  right axillary lymph nodes  which extend inferior and lateral.  There is a hypermetabolic mass within in the left breast measuring  20 mm (image 93) with SUV max = 12.8.  There are hypermetabolic left axillary lymph nodes with a 12 mm  rounded nodule node with SUV max = 6.5.  There are no hypermetabolic supraclavicular, infraclavicular, or  mediastinal lymph nodes. No suspicious pulmonary nodules.  Abdomen/Pelvis: No abnormal hypermetabolic activity within the  liver, pancreas, adrenal glands, or spleen. No hypermetabolic  lymph nodes in the abdomen or pelvis.  Skeleton: No focal hypermetabolic activity to suggest skeletal  metastasis.  IMPRESSION:  1. Intensely hypermetabolic right breast mass erupting through  the skin surface consistent with primary breast carcinoma.  2. Nodular tumor involvement of the right pectoralis muscle with  extension through the chest wall to the pleural surface adjacent to  the sternum.  3. Multiple metastatic right axillary lymph nodes.  4. Hypermetabolic breast in the left breast consistent  with breast  carcinoma.  5. Hypermetabolic left axillary nodes consistent with local  metastatic nodal metastasis.  5. No evidence of systemic metastasis.  Original Report Authenticated By: Erika Nunez, M.D.    PATHOLOGY: 10/02/2012  ADDITIONAL INFORMATION: CHROMOGENIC IN-SITU HYBRIDIZATION Interpretation: HER2/NEU BY CISH - SHOWS AMPLIFICATION BY CISH ANALYSIS. THE RATIO OF HER2: CEP 17 SIGNALS WAS 2.89 Reference range: Ratio: HER2:CEP17 < 1.8 gene amplification not observed Ratio: HER2:CEP 17 1.8-2.2 - equivocal result Ratio: HER2:CEP17 > 2.2 - gene amplification observed Erika Leisure MD Pathologist, Electronic Signature ( Signed 10/10/2012) PROGNOSTIC INDICATORS - ACIS Results IMMUNOHISTOCHEMICAL AND MORPHOMETRIC ANALYSIS BY THE AUTOMATED CELLULAR IMAGING SYSTEM (ACIS) Estrogen Receptor (Negative, <1%): 0% NEGATIVE Progesterone Receptor  (Negative, <1%): 0%, NEGATIVE Proliferation Marker Ki67 by M IB-1 (Low<20%): 70% COMMENT: The negative hormone receptor study(ies) in this case have no internal positive control. 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 PLEASE SEE COMMENT FOR DETAILS. Microscopic Comment Received grossly are two fragments of adipose tissue measuring 2.8 and 2.5 cm. Microscopically, sections show grade II invasive ductal carcinoma in a background of extensive necrosis. The inked, unoriented margins are extensively involved by tumor. A breast prognostic profile will be performed and addendum report will follow. An E-cadherin stain was performed and the stain confirmed the above interpretation. Dr. Leticia Penna was paged on 10/04/2012. (HCL:eps 10/04/12) Erika Miyamoto MD Pathologist, Electronic Signature (Case signed 10/05/2012)     ASSESSMENT:  1. Stage IIIC/Stage IV, Her2+, ER-, PR-, KI-67 70%, breast cancer. Starting systemic chemotherapy consisting of [facility] today.  She did have hypermetabolic activity on her PET scan of the lesion noted in her left breast. 2. Renal insufficiency  3 . HTN  4. Hyperlipidemia  5. 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  6. Leukocytosis, reactive 7. Thrombocytosis, reactive    PLAN:  1. I personally reviewed and went over laboratory results with the patient. 2. I personally reviewed and went over radiographic studies with the patient. 3. Pre-chemotherapy labs ordered: CBC diff, CMET, CA 27.29. 4. 2D Echo every 3 months 5. Patient education regarding breast cancer, its stage, and treatment 6.  Patient education regarding chemotherapy-induced nausea/vomtiing 7. Patient education regarding TCH 8. Return in 3 weeks for follow-up.     All questions were answered. The patient knows to call the clinic with any problems, questions or concerns. We can certainly see the patient much sooner if necessary.  The patient and plan discussed with Glenford Peers, MD and he is in agreement with the aforementioned.  Erika Nunez

## 2012-10-24 ENCOUNTER — Encounter (HOSPITAL_BASED_OUTPATIENT_CLINIC_OR_DEPARTMENT_OTHER): Payer: Medicaid Other

## 2012-10-24 VITALS — BP 105/55 | HR 83 | Temp 98.0°F | Resp 18

## 2012-10-24 DIAGNOSIS — C50919 Malignant neoplasm of unspecified site of unspecified female breast: Secondary | ICD-10-CM

## 2012-10-24 DIAGNOSIS — C773 Secondary and unspecified malignant neoplasm of axilla and upper limb lymph nodes: Secondary | ICD-10-CM

## 2012-10-24 MED ORDER — PEGFILGRASTIM INJECTION 6 MG/0.6ML
6.0000 mg | Freq: Once | SUBCUTANEOUS | Status: AC
  Administered 2012-10-24: 6 mg via SUBCUTANEOUS

## 2012-10-24 MED ORDER — PEGFILGRASTIM INJECTION 6 MG/0.6ML
SUBCUTANEOUS | Status: AC
  Filled 2012-10-24: qty 0.6

## 2012-10-24 NOTE — Progress Notes (Signed)
Erika Nunez presents today for injection per MD orders. Neulasta 6mg  administered SQ in left Abdomen. Administration without incident. Patient tolerated well and had no problems post chemo yesterday.

## 2012-10-30 ENCOUNTER — Inpatient Hospital Stay (HOSPITAL_COMMUNITY): Payer: Self-pay

## 2012-11-08 ENCOUNTER — Inpatient Hospital Stay (HOSPITAL_COMMUNITY): Payer: Self-pay

## 2012-11-08 NOTE — Progress Notes (Signed)
No primary provider on file. No primary provider on file.  Breast cancer, right  CURRENT THERAPY: S/P Cycle 1 of Carboplatin/Docetaxol/Herceptin beginning on 10/23/2012 with Neulasta support.  INTERVAL HISTORY: Erika Nunez 62 y.o. female returns for  regular  visit for followup of Stage IIIC/Stage IV, Her2+, ER-, PR-, KI-67 70%, breast cancer. Starting systemic chemotherapy consisting of [facility] 10/23/2012.  [name] is here for follow-up after completing her first cycle of chemotherapy.   [name] did not do very well with her first cycle of chemotherapy. She has significant nausea and vomiting starting on day 2 of cycle 1.  She admits that this carried on for approximately 4-6 days. She explains that her last 2 days only consisted of nausea with minimal vomiting. She did admit to multiple emeses in the first few days following chemotherapy administration. She was warned in the past that this happened she is to contact the clinic, but she failed to do so, so this is the first time I am hearing that she did not tolerate cycle 1 well.  As a result of this, I have revamped her antiemetic regimen.  Her prechemotherapy antiemetic regimen will consist of dexamethasone 20 mg IV, Aloxi 0.25 mg, and Emend 150 mg.  She was educated that she must  her Zofran until day 4 of chemotherapy.  At that time she can start her Zofran. I will call in a prescription for Compazine by mouth. She admits that Compazine suppositories really did help her so I hope PO Compazine will be effective for her.  This was E. scribed to her pharmacy. She will still have Ativan in addition to the Compazine during her first few days after therapy if she has nausea and/or vomiting. She was strongly encouraged to contact the clinic if she has issues with cycle 2 of therapy so that we can manage this more effectively.  She also admits to diarrhea that began just a few days ago and therefore the end of cycle 1. She reports it lasted for  approximately 2 days. She reports that she was having at least 7 loose bowel movements in a 24-hour period during this timeframe. She did take Imodium which resolved and diarrhea are relatively rapidly. She does admit that she didn't feel 100% well during this time and therefore I suspect she has some sort of viral illness. She is agreeable to this.  I've encouraged the patient to continue packing her open wound of her right breast with packing material. She is encouraged to continue this until the wound nearly completely closes and heals.  Otherwise, the patient denies any complaints. She feels well today. ROS questioning is otherwise negative.  Past Medical History  Diagnosis Date  . Hypertension   . High cholesterol   . Cancer   . Breast cancer   . Allergy     seasonal allergies  . Arthritis   . Anxiety   . Breast mass, left 2014  . Breast mass, right 2014    has Breast cancer on her problem list.     is allergic to codeine.  [name] does not currently have medications on file.  Past Surgical History  Procedure Laterality Date  . Cholecystectomy    . Tubal ligation    . Tonsillectomy    . Breast biopsy  10/02/2012    Procedure: BREAST BIOPSY;  Surgeon: Fabio Bering, MD;  Location: [facility];  Service: General;  Laterality: Right;  . Portacath placement Left 10/20/2012  . Breast biopsy Left  10/20/2012    Procedure: BREAST BIOPSY;  Surgeon: Fabio Bering, MD;  Location: [facility];  Service: General;  Laterality: Left;  Procedure end 1142  . Portacath placement Left 10/20/2012    Procedure: INSERTION PORT-A-CATH;  Surgeon: Fabio Bering, MD;  Location: [facility];  Service: General;  Laterality: Left;  Procedure began 1143; left subclavian    Denies any headaches, dizziness, double vision, fevers, chills, night sweats, nausea, vomiting, constipation, chest pain, heart palpitations, shortness of breath, blood in stool, black tarry stool, urinary pain, urinary burning, urinary  frequency, hematuria.   PHYSICAL EXAMINATION  ECOG PERFORMANCE STATUS: 1 - Symptomatic but completely ambulatory  Filed Vitals:   11/09/12 1100  BP: 104/73  Pulse: 93  Temp: 97.4 F (36.3 C)  Resp: 20    GENERAL:alert, no distress, comfortable, cooperative, obese and smiling SKIN: skin color, texture, turgor are normal, no rashes or significant lesions HEAD: Normocephalic, No masses, lesions, tenderness or abnormalities EYES: normal, Conjunctiva are pink and non-injected EARS: External ears normal OROPHARYNX:mucous membranes are moist  NECK: supple, no adenopathy, thyroid normal size, non-tender, without nodularity, no stridor, non-tender, trachea midline LYMPH:  no palpable lymphadenopathy BREAST:right breast is diffusely erythematous with a 2 x 2 cm open area that is draining white-milky material. It is improved. LUNGS: clear to auscultation and percussion HEART: regular rate & rhythm, no murmurs, no gallops, S1 normal and S2 normal ABDOMEN:abdomen soft, obese and normal bowel sounds BACK: Back symmetric, no curvature. EXTREMITIES:less then 2 second capillary refill, no joint deformities, effusion, or inflammation, no edema, no skin discoloration, no clubbing, no cyanosis  NEURO: alert & oriented x 3 with fluent speech, no focal motor/sensory deficits, gait normal    LABORATORY DATA: CBC    Component Value Date/Time   WBC 17.4* 10/23/2012 0937   RBC 3.78* 10/23/2012 0937   HGB 11.1* 10/23/2012 0937   HCT 32.9* 10/23/2012 0937   PLT 451* 10/23/2012 0937   MCV 87.0 10/23/2012 0937   MCH 29.4 10/23/2012 0937   MCHC 33.7 10/23/2012 0937   RDW 14.2 10/23/2012 0937   LYMPHSABS 0.8 10/23/2012 0937   MONOABS 0.9 10/23/2012 0937   EOSABS 0.0 10/23/2012 0937   BASOSABS 0.0 10/23/2012 0937      Chemistry      Component Value Date/Time   NA 135 10/23/2012 0937   K 3.5 10/23/2012 0937   CL 98 10/23/2012 0937   CO2 23 10/23/2012 0937   BUN 19 10/23/2012 0937   CREATININE 1.08 10/23/2012  0937      Component Value Date/Time   CALCIUM 10.1 10/23/2012 0937   ALKPHOS 91 10/23/2012 0937   AST 16 10/23/2012 0937   ALT 19 10/23/2012 0937   BILITOT 0.2* 10/23/2012 0937     Lab Results  Component Value Date   LABCA2 57* 10/23/2012      PATHOLOGY: 10/02/2012  ADDITIONAL INFORMATION:  CHROMOGENIC IN-SITU HYBRIDIZATION  Interpretation:  HER2/NEU BY CISH - SHOWS AMPLIFICATION BY CISH ANALYSIS. THE RATIO OF HER2: CEP 17  SIGNALS WAS 2.89  Reference range:  Ratio: HER2:CEP17 < 1.8 gene amplification not observed  Ratio: HER2:CEP 17 1.8-2.2 - equivocal result  Ratio: HER2:CEP17 > 2.2 - gene amplification observed  Pecola Leisure MD  Pathologist, Electronic Signature  ( Signed 10/10/2012)  PROGNOSTIC INDICATORS - ACIS  Results  IMMUNOHISTOCHEMICAL AND MORPHOMETRIC ANALYSIS BY THE AUTOMATED CELLULAR  IMAGING SYSTEM (ACIS)  Estrogen Receptor (Negative, <1%): 0% NEGATIVE  Progesterone Receptor (Negative, <1%): 0%, NEGATIVE  Proliferation Marker Ki67  by Fortino Sic (Low<20%): 70%  COMMENT: The negative hormone receptor study(ies) in this case have no internal positive control.  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 PLEASE  SEE COMMENT FOR DETAILS.  Microscopic Comment  Received grossly are two fragments of adipose tissue measuring 2.8 and 2.5 cm. Microscopically, sections  show grade II invasive ductal carcinoma in a background of extensive necrosis. The inked, unoriented  margins are extensively involved by tumor. A breast prognostic profile will be performed and addendum report  will follow. An E-cadherin stain was performed  and the stain confirmed the above interpretation. Dr. Leticia Penna  was paged on 10/04/2012. (HCL:eps 10/04/12)  Abigail Miyamoto MD  Pathologist, Electronic Signature  (Case signed 10/05/2012)     ASSESSMENT:  1. Stage IIIC/Stage IV, Her2+, ER-, PR-, KI-67 70%, breast cancer. Starting systemic chemotherapy consisting of [facility] today. She did have hypermetabolic activity on her PET scan of the lesion noted in her left breast.  2. Renal insufficiency  3 . HTN  4. Hyperlipidemia  5. 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  6. Leukocytosis, reactive  7. Thrombocytosis, reactive 8. Nausea, vomiting following chemotherapy. 8. Diarrhea Day 2 of chemotherapy.    PLAN:  1. I personally reviewed and went over laboratory results with the patient.  2. I personally reviewed and went over radiographic studies with the patient.  3. Pre-chemotherapy labs ordered: CBC diff, CMET, CA 27.29.  4. 2D Echo every 3 months.  Next one is scheduled for 01/12/13. 5. Continue to pack open wound from breast to keep it clean until the open wound closes.  6. Will change antiemetic regimen to consist of Aloxi and Emend in treatment plan in addition to Dexamethasone 20 mg IV. 7. Compazine 10 mg PO every 6 hours PRN nausea/vomiting e-scribed to [facility]. 8. Return in 3 weeks for follow-up.  If patient does not tolerate this cycle of chemotherapy well with more aggressive antiemetic treatment, we may need to consider dose reduction or change in therapy.    All questions were answered. The patient knows to call the clinic with any problems, questions or concerns. We can certainly see the patient much sooner if necessary.  Patient and plan will be discussed with Dr. Mariel Sleet within the next 24 hours.    Windie Marasco

## 2012-11-09 ENCOUNTER — Ambulatory Visit (HOSPITAL_COMMUNITY): Payer: Self-pay

## 2012-11-09 ENCOUNTER — Encounter (HOSPITAL_COMMUNITY): Payer: Self-pay | Admitting: Oncology

## 2012-11-09 ENCOUNTER — Encounter (HOSPITAL_BASED_OUTPATIENT_CLINIC_OR_DEPARTMENT_OTHER): Payer: Medicaid Other | Admitting: Oncology

## 2012-11-09 VITALS — BP 104/73 | HR 93 | Temp 97.4°F | Resp 20 | Wt 185.0 lb

## 2012-11-09 DIAGNOSIS — C50919 Malignant neoplasm of unspecified site of unspecified female breast: Secondary | ICD-10-CM

## 2012-11-09 DIAGNOSIS — D473 Essential (hemorrhagic) thrombocythemia: Secondary | ICD-10-CM

## 2012-11-09 DIAGNOSIS — D72829 Elevated white blood cell count, unspecified: Secondary | ICD-10-CM

## 2012-11-09 DIAGNOSIS — C50911 Malignant neoplasm of unspecified site of right female breast: Secondary | ICD-10-CM

## 2012-11-09 DIAGNOSIS — R112 Nausea with vomiting, unspecified: Secondary | ICD-10-CM

## 2012-11-09 MED ORDER — PROCHLORPERAZINE MALEATE 10 MG PO TABS: 10.0000 mg | ORAL_TABLET | Freq: Four times a day (QID) | ORAL | Status: DC | PRN

## 2012-11-09 NOTE — Patient Instructions (Addendum)
[facility] Discharge Instructions  RECOMMENDATIONS MADE BY THE CONSULTANT AND ANY TEST RESULTS WILL BE SENT TO YOUR REFERRING PHYSICIAN.  We will change the type of anti-nausea medicine we give you during chemotherapy. You will be able to take the Ativan (lorazepam) as you need it any time after chemo for nausea. The way you take the Zofran will change. If you take chemo on Monday, you will not start the Zofran until 3 days after chemo which would be on Thursday, then you may take it 2 x daily for 2 days. Use the Imodium if needed. Call us if your medications do not stop nausea or diarrhea.  Keep appointments as scheduled.  Thank you for choosing [facility] to provide your oncology and hematology care.  To afford each patient quality time with our providers, please arrive at least 15 minutes before your scheduled appointment time.  With your help, our goal is to use those 15 minutes to complete the necessary work-up to ensure our physicians have the information they need to help with your evaluation and healthcare recommendations.    Effective January 1st, 2014, we ask that you re-schedule your appointment with our physicians should you arrive 10 or more minutes late for your appointment.  We strive to give you quality time with our providers, and arriving late affects you and other patients whose appointments are after yours.    Again, thank you for choosing [facility].  Our hope is that these requests will decrease the amount of time that you wait before being seen by our physicians.       _____________________________________________________________  Should you have questions after your visit to [facility], please contact our office at [phone] between the hours of 8:30 a.m. and 5:00 p.m.  Voicemails left after 4:30 p.m. will not be returned until the following business day.  For prescription refill requests, have your  pharmacy contact our office with your prescription refill request.

## 2012-11-10 ENCOUNTER — Ambulatory Visit (HOSPITAL_COMMUNITY): Payer: Self-pay | Admitting: Oncology

## 2012-11-13 ENCOUNTER — Inpatient Hospital Stay (HOSPITAL_COMMUNITY): Payer: Self-pay

## 2012-11-13 ENCOUNTER — Encounter (HOSPITAL_COMMUNITY): Payer: Medicaid Other | Attending: Oncology

## 2012-11-13 VITALS — BP 138/79 | HR 82 | Temp 97.4°F | Resp 18 | Wt 196.2 lb

## 2012-11-13 DIAGNOSIS — C773 Secondary and unspecified malignant neoplasm of axilla and upper limb lymph nodes: Secondary | ICD-10-CM

## 2012-11-13 DIAGNOSIS — C50919 Malignant neoplasm of unspecified site of unspecified female breast: Secondary | ICD-10-CM | POA: Insufficient documentation

## 2012-11-13 LAB — CBC WITH DIFFERENTIAL/PLATELET
Basophils Absolute: 0 10*3/uL (ref 0.0–0.1)
Basophils Relative: 0 % (ref 0–1)
Eosinophils Absolute: 0 10*3/uL (ref 0.0–0.7)
HCT: 29.7 % — ABNORMAL LOW (ref 36.0–46.0)
Hemoglobin: 10.2 g/dL — ABNORMAL LOW (ref 12.0–15.0)
MCH: 29.8 pg (ref 26.0–34.0)
MCHC: 34.3 g/dL (ref 30.0–36.0)
Monocytes Absolute: 0.9 10*3/uL (ref 0.1–1.0)
Monocytes Relative: 5 % (ref 3–12)
Neutro Abs: 15.9 10*3/uL — ABNORMAL HIGH (ref 1.7–7.7)
RDW: 16.2 % — ABNORMAL HIGH (ref 11.5–15.5)

## 2012-11-13 LAB — COMPREHENSIVE METABOLIC PANEL
AST: 12 U/L (ref 0–37)
Albumin: 3.1 g/dL — ABNORMAL LOW (ref 3.5–5.2)
BUN: 14 mg/dL (ref 6–23)
Calcium: 8.9 mg/dL (ref 8.4–10.5)
Chloride: 102 mEq/L (ref 96–112)
Creatinine, Ser: 0.86 mg/dL (ref 0.50–1.10)
Total Bilirubin: 0.2 mg/dL — ABNORMAL LOW (ref 0.3–1.2)
Total Protein: 6.3 g/dL (ref 6.0–8.3)

## 2012-11-13 LAB — CANCER ANTIGEN 27.29: CA 27.29: 23 U/mL (ref 0–39)

## 2012-11-13 MED ORDER — HEPARIN SOD (PORK) LOCK FLUSH 100 UNIT/ML IV SOLN
500.0000 [IU] | Freq: Once | INTRAVENOUS | Status: AC | PRN
  Administered 2012-11-13: 500 [IU]
  Filled 2012-11-13: qty 5

## 2012-11-13 MED ORDER — SODIUM CHLORIDE 0.9 % IV SOLN
568.8000 mg | Freq: Once | INTRAVENOUS | Status: AC
  Administered 2012-11-13: 570 mg via INTRAVENOUS
  Filled 2012-11-13: qty 57

## 2012-11-13 MED ORDER — DIPHENHYDRAMINE HCL 25 MG PO CAPS
ORAL_CAPSULE | ORAL | Status: AC
  Filled 2012-11-13: qty 2

## 2012-11-13 MED ORDER — SODIUM CHLORIDE 0.9 % IV SOLN
Freq: Once | INTRAVENOUS | Status: AC
  Administered 2012-11-13: 11:00:00 via INTRAVENOUS
  Filled 2012-11-13: qty 5

## 2012-11-13 MED ORDER — ACETAMINOPHEN 325 MG PO TABS
ORAL_TABLET | ORAL | Status: AC
  Filled 2012-11-13: qty 2

## 2012-11-13 MED ORDER — ACETAMINOPHEN 325 MG PO TABS
650.0000 mg | ORAL_TABLET | Freq: Four times a day (QID) | ORAL | Status: DC | PRN
  Administered 2012-11-13: 650 mg via ORAL

## 2012-11-13 MED ORDER — PALONOSETRON HCL INJECTION 0.25 MG/5ML
0.2500 mg | Freq: Once | INTRAVENOUS | Status: AC
  Administered 2012-11-13: 0.25 mg via INTRAVENOUS

## 2012-11-13 MED ORDER — DIPHENHYDRAMINE HCL 25 MG PO CAPS
50.0000 mg | ORAL_CAPSULE | Freq: Once | ORAL | Status: AC
  Administered 2012-11-13: 50 mg via ORAL

## 2012-11-13 MED ORDER — DIPHENHYDRAMINE HCL 50 MG/ML IJ SOLN: 50.0000 mg | Freq: Once | INTRAMUSCULAR | Status: DC

## 2012-11-13 MED ORDER — DOCETAXEL CHEMO INJECTION 160 MG/16ML
75.0000 mg/m2 | Freq: Once | INTRAVENOUS | Status: AC
  Administered 2012-11-13: 150 mg via INTRAVENOUS
  Filled 2012-11-13: qty 15

## 2012-11-13 MED ORDER — SODIUM CHLORIDE 0.9 % IV SOLN
Freq: Once | INTRAVENOUS | Status: AC
  Administered 2012-11-13: 09:00:00 via INTRAVENOUS

## 2012-11-13 MED ORDER — PALONOSETRON HCL INJECTION 0.25 MG/5ML
INTRAVENOUS | Status: AC
  Filled 2012-11-13: qty 5

## 2012-11-13 MED ORDER — TRASTUZUMAB CHEMO INJECTION 440 MG
6.0000 mg/kg | Freq: Once | INTRAVENOUS | Status: AC
  Administered 2012-11-13: 546 mg via INTRAVENOUS
  Filled 2012-11-13: qty 26

## 2012-11-13 MED ORDER — SODIUM CHLORIDE 0.9 % IJ SOLN
10.0000 mL | INTRAMUSCULAR | Status: DC | PRN
  Administered 2012-11-13: 10 mL
  Filled 2012-11-13: qty 10

## 2012-11-13 NOTE — Progress Notes (Signed)
Tolerated chemo well. 

## 2012-11-14 ENCOUNTER — Encounter (HOSPITAL_BASED_OUTPATIENT_CLINIC_OR_DEPARTMENT_OTHER): Payer: Medicaid Other

## 2012-11-14 ENCOUNTER — Ambulatory Visit (HOSPITAL_COMMUNITY): Payer: Self-pay

## 2012-11-14 VITALS — BP 135/74 | HR 91 | Temp 97.0°F | Resp 20

## 2012-11-14 DIAGNOSIS — C50919 Malignant neoplasm of unspecified site of unspecified female breast: Secondary | ICD-10-CM

## 2012-11-14 DIAGNOSIS — Z5189 Encounter for other specified aftercare: Secondary | ICD-10-CM

## 2012-11-14 DIAGNOSIS — C773 Secondary and unspecified malignant neoplasm of axilla and upper limb lymph nodes: Secondary | ICD-10-CM

## 2012-11-14 MED ORDER — PEGFILGRASTIM INJECTION 6 MG/0.6ML
6.0000 mg | Freq: Once | SUBCUTANEOUS | Status: AC
  Administered 2012-11-14: 6 mg via SUBCUTANEOUS

## 2012-11-14 MED ORDER — PEGFILGRASTIM INJECTION 6 MG/0.6ML
SUBCUTANEOUS | Status: AC
  Filled 2012-11-14: qty 0.6

## 2012-11-14 NOTE — Progress Notes (Signed)
Erika Nunez presents today for injection per MD orders. Neulasta 6mg  administered SQ in left Abdomen. Administration without incident. Patient tolerated well.

## 2012-11-20 ENCOUNTER — Inpatient Hospital Stay (HOSPITAL_COMMUNITY): Payer: Self-pay

## 2012-11-27 ENCOUNTER — Other Ambulatory Visit (HOSPITAL_COMMUNITY): Payer: Self-pay | Admitting: Oncology

## 2012-11-27 DIAGNOSIS — C50911 Malignant neoplasm of unspecified site of right female breast: Secondary | ICD-10-CM

## 2012-11-27 MED ORDER — HYDROCODONE-ACETAMINOPHEN 5-325 MG PO TABS: 1.0000 | ORAL_TABLET | ORAL | Status: DC | PRN

## 2012-11-27 MED ORDER — LORAZEPAM 1 MG PO TABS: 1.0000 mg | ORAL_TABLET | ORAL | Status: DC | PRN

## 2012-11-29 ENCOUNTER — Inpatient Hospital Stay (HOSPITAL_COMMUNITY): Payer: Self-pay

## 2012-11-30 ENCOUNTER — Ambulatory Visit (HOSPITAL_COMMUNITY): Payer: Self-pay

## 2012-12-01 ENCOUNTER — Encounter (HOSPITAL_BASED_OUTPATIENT_CLINIC_OR_DEPARTMENT_OTHER): Payer: Medicaid Other | Admitting: Oncology

## 2012-12-01 VITALS — BP 128/93 | HR 101 | Temp 97.7°F | Resp 20 | Wt 193.9 lb

## 2012-12-01 DIAGNOSIS — C50919 Malignant neoplasm of unspecified site of unspecified female breast: Secondary | ICD-10-CM

## 2012-12-01 NOTE — Patient Instructions (Signed)
[facility] Discharge Instructions  RECOMMENDATIONS MADE BY THE CONSULTANT AND ANY TEST RESULTS WILL BE SENT TO YOUR REFERRING PHYSICIAN.  EXAM FINDINGS BY THE PHYSICIAN TODAY AND SIGNS OR SYMPTOMS TO REPORT TO CLINIC OR PRIMARY PHYSICIAN: Exam findings as discussed by Dr. Mariel Sleet.  SPECIAL INSTRUCTIONS/FOLLOW-UP: Please keep your appointments as scheduled.  We will be coordinating your follow-up with Dr. Thersa Salt.  Continue with dressing changes as previously instructed.  Thank you for choosing [facility] to provide your oncology and hematology care.  To afford each patient quality time with our providers, please arrive at least 15 minutes before your scheduled appointment time.  With your help, our goal is to use those 15 minutes to complete the necessary work-up to ensure our physicians have the information they need to help with your evaluation and healthcare recommendations.    Effective January 1st, 2014, we ask that you re-schedule your appointment with our physicians should you arrive 10 or more minutes late for your appointment.  We strive to give you quality time with our providers, and arriving late affects you and other patients whose appointments are after yours.    Again, thank you for choosing [facility].  Our hope is that these requests will decrease the amount of time that you wait before being seen by our physicians.       _____________________________________________________________  Should you have questions after your visit to [facility], please contact our office at [phone] between the hours of 8:30 a.m. and 5:00 p.m.  Voicemails left after 4:30 p.m. will not be returned until the following business day.  For prescription refill requests, have your pharmacy contact our office with your prescription refill request.

## 2012-12-01 NOTE — Progress Notes (Signed)
Advanced T4 B., N2, right-sided breast cancer also with left breast cancer as well which may be metastasis versus a simultaneous primary. She is status post 2 cycles now of carboplatinum and trastuzumab based chemotherapy. Her original tumor was 16 cm x 16 cm minimum. It is now down to' 8 x 10 cm with tremendous shrinkage of the mass. She still has an open wound which is several centimeters across but much much better. It almost appears as if she's had a mastectomy there is still little tumor tissue or breast tissue left. The breast was essentially totally replaced by tumor and now she has no obvious extension of the tumor off the chest wall except for a centimeter and a half laterally next the open wound in approximately the same amount medially and superiorly. The left breast does not reveal an obvious mass but she has a little drainage from the biopsy site. Her port is in place. Her lungs are clear. Heart shows a regular rhythm and rate. She looks like a different person. She does not look depressed anymore. She knows it is working. Her abdomen is soft without hepatosplenomegaly.  I wasn't sure we are going to get this nice response. She's had only 2 cycles of a planned 6.  Therefore I wonder see Dr. Leticia Penna in consultation in approximately a month but I also want her to see Dr. Thersa Salt consultation sometime in the next 3-4 weeks because she may be a candidate for radiation therapy if nothing else. I am wondering if she'll be a candidate for surgery resection but she did appear to have chest wall involvement. So she will try need be scanned after the sixth cycle and then make some determinations. She will of course need ongoing HER-2/neu therapy and the question will be she would just add pertuzumab to the trastuzumab or change to T-DM1 which has been approved for metastatic disease somewhere down the line. Either way we will see her in 4 weeks. I am delighted with her responses so far. She will continue to  clean the wounds bilaterally.

## 2012-12-01 NOTE — Addendum Note (Signed)
Addended by: Corena Herter D on: 12/01/2012 04:58 PM   Modules accepted: Orders

## 2012-12-04 ENCOUNTER — Inpatient Hospital Stay (HOSPITAL_COMMUNITY): Payer: Self-pay

## 2012-12-04 ENCOUNTER — Encounter (HOSPITAL_BASED_OUTPATIENT_CLINIC_OR_DEPARTMENT_OTHER): Payer: Medicaid Other

## 2012-12-04 DIAGNOSIS — Z5112 Encounter for antineoplastic immunotherapy: Secondary | ICD-10-CM

## 2012-12-04 DIAGNOSIS — C773 Secondary and unspecified malignant neoplasm of axilla and upper limb lymph nodes: Secondary | ICD-10-CM

## 2012-12-04 DIAGNOSIS — Z5111 Encounter for antineoplastic chemotherapy: Secondary | ICD-10-CM

## 2012-12-04 DIAGNOSIS — C50919 Malignant neoplasm of unspecified site of unspecified female breast: Secondary | ICD-10-CM

## 2012-12-04 DIAGNOSIS — C50911 Malignant neoplasm of unspecified site of right female breast: Secondary | ICD-10-CM

## 2012-12-04 LAB — CBC WITH DIFFERENTIAL/PLATELET
Eosinophils Absolute: 0 10*3/uL (ref 0.0–0.7)
Hemoglobin: 9.9 g/dL — ABNORMAL LOW (ref 12.0–15.0)
Lymphocytes Relative: 5 % — ABNORMAL LOW (ref 12–46)
Lymphs Abs: 1.1 10*3/uL (ref 0.7–4.0)
MCH: 31.1 pg (ref 26.0–34.0)
Monocytes Relative: 7 % (ref 3–12)
Neutrophils Relative %: 88 % — ABNORMAL HIGH (ref 43–77)
RBC: 3.18 MIL/uL — ABNORMAL LOW (ref 3.87–5.11)

## 2012-12-04 LAB — COMPREHENSIVE METABOLIC PANEL
Alkaline Phosphatase: 73 U/L (ref 39–117)
BUN: 11 mg/dL (ref 6–23)
CO2: 26 mEq/L (ref 19–32)
Chloride: 97 mEq/L (ref 96–112)
GFR calc Af Amer: 65 mL/min — ABNORMAL LOW (ref >90)
GFR calc non Af Amer: 56 mL/min — ABNORMAL LOW (ref >90)
Glucose, Bld: 130 mg/dL — ABNORMAL HIGH (ref 70–99)
Potassium: 4 mEq/L (ref 3.5–5.1)
Total Bilirubin: 0.2 mg/dL — ABNORMAL LOW (ref 0.3–1.2)

## 2012-12-04 MED ORDER — ACETAMINOPHEN 325 MG PO TABS: 650.0000 mg | ORAL_TABLET | Freq: Once | ORAL | Status: DC

## 2012-12-04 MED ORDER — HEPARIN SOD (PORK) LOCK FLUSH 100 UNIT/ML IV SOLN
500.0000 [IU] | Freq: Once | INTRAVENOUS | Status: AC | PRN
  Administered 2012-12-04: 500 [IU]
  Filled 2012-12-04: qty 5

## 2012-12-04 MED ORDER — SODIUM CHLORIDE 0.9 % IV SOLN
Freq: Once | INTRAVENOUS | Status: AC
  Administered 2012-12-04: 10:00:00 via INTRAVENOUS
  Filled 2012-12-04: qty 5

## 2012-12-04 MED ORDER — PALONOSETRON HCL INJECTION 0.25 MG/5ML
INTRAVENOUS | Status: AC
  Filled 2012-12-04: qty 5

## 2012-12-04 MED ORDER — PALONOSETRON HCL INJECTION 0.25 MG/5ML
0.2500 mg | Freq: Once | INTRAVENOUS | Status: AC
  Administered 2012-12-04: 0.25 mg via INTRAVENOUS

## 2012-12-04 MED ORDER — SODIUM CHLORIDE 0.9 % IV SOLN
Freq: Once | INTRAVENOUS | Status: AC
  Administered 2012-12-04: 09:00:00 via INTRAVENOUS

## 2012-12-04 MED ORDER — TRASTUZUMAB CHEMO INJECTION 440 MG
6.0000 mg/kg | Freq: Once | INTRAVENOUS | Status: AC
  Administered 2012-12-04: 546 mg via INTRAVENOUS
  Filled 2012-12-04: qty 26

## 2012-12-04 MED ORDER — HEPARIN SOD (PORK) LOCK FLUSH 100 UNIT/ML IV SOLN
INTRAVENOUS | Status: AC
  Filled 2012-12-04: qty 5

## 2012-12-04 MED ORDER — DOCETAXEL CHEMO INJECTION 160 MG/16ML
75.0000 mg/m2 | Freq: Once | INTRAVENOUS | Status: AC
  Administered 2012-12-04: 150 mg via INTRAVENOUS
  Filled 2012-12-04: qty 15

## 2012-12-04 MED ORDER — SODIUM CHLORIDE 0.9 % IV SOLN
568.8000 mg | Freq: Once | INTRAVENOUS | Status: AC
  Administered 2012-12-04: 570 mg via INTRAVENOUS
  Filled 2012-12-04: qty 57

## 2012-12-04 MED ORDER — DIPHENHYDRAMINE HCL 25 MG PO CAPS: 50.0000 mg | ORAL_CAPSULE | Freq: Once | ORAL | Status: DC

## 2012-12-04 MED ORDER — SODIUM CHLORIDE 0.9 % IJ SOLN
10.0000 mL | INTRAMUSCULAR | Status: DC | PRN
  Filled 2012-12-04: qty 10

## 2012-12-05 ENCOUNTER — Encounter (HOSPITAL_BASED_OUTPATIENT_CLINIC_OR_DEPARTMENT_OTHER): Payer: Medicaid Other

## 2012-12-05 VITALS — BP 123/56 | HR 88

## 2012-12-05 DIAGNOSIS — C773 Secondary and unspecified malignant neoplasm of axilla and upper limb lymph nodes: Secondary | ICD-10-CM

## 2012-12-05 DIAGNOSIS — C50919 Malignant neoplasm of unspecified site of unspecified female breast: Secondary | ICD-10-CM

## 2012-12-05 LAB — CANCER ANTIGEN 27.29: CA 27.29: 12 U/mL (ref 0–39)

## 2012-12-05 MED ORDER — PEGFILGRASTIM INJECTION 6 MG/0.6ML
6.0000 mg | Freq: Once | SUBCUTANEOUS | Status: AC
  Administered 2012-12-05: 6 mg via SUBCUTANEOUS

## 2012-12-05 MED ORDER — PEGFILGRASTIM INJECTION 6 MG/0.6ML
SUBCUTANEOUS | Status: AC
  Filled 2012-12-05: qty 0.6

## 2012-12-05 NOTE — Progress Notes (Signed)
Erika Nunez presents today for injection per MD orders. Neulasta 6mg  administered SQ in right Abdomen. Administration without incident. Patient tolerated well.

## 2012-12-25 ENCOUNTER — Encounter (HOSPITAL_COMMUNITY): Payer: Medicaid Other | Attending: Oncology

## 2012-12-25 ENCOUNTER — Ambulatory Visit (HOSPITAL_COMMUNITY)
Admission: RE | Admit: 2012-12-25 | Discharge: 2012-12-25 | Disposition: A | Discharge status: Home / Self Care | Payer: Medicaid Other | Source: Ambulatory Visit | Attending: Oncology | Admitting: Oncology

## 2012-12-25 ENCOUNTER — Other Ambulatory Visit (HOSPITAL_COMMUNITY): Payer: Self-pay | Admitting: Oncology

## 2012-12-25 ENCOUNTER — Telehealth (HOSPITAL_COMMUNITY): Payer: Self-pay | Admitting: Oncology

## 2012-12-25 ENCOUNTER — Encounter (HOSPITAL_BASED_OUTPATIENT_CLINIC_OR_DEPARTMENT_OTHER): Payer: Medicaid Other | Admitting: Oncology

## 2012-12-25 DIAGNOSIS — C50919 Malignant neoplasm of unspecified site of unspecified female breast: Secondary | ICD-10-CM

## 2012-12-25 DIAGNOSIS — Z5111 Encounter for antineoplastic chemotherapy: Secondary | ICD-10-CM

## 2012-12-25 DIAGNOSIS — M7989 Other specified soft tissue disorders: Secondary | ICD-10-CM

## 2012-12-25 DIAGNOSIS — C773 Secondary and unspecified malignant neoplasm of axilla and upper limb lymph nodes: Secondary | ICD-10-CM

## 2012-12-25 DIAGNOSIS — Z5112 Encounter for antineoplastic immunotherapy: Secondary | ICD-10-CM

## 2012-12-25 DIAGNOSIS — Z853 Personal history of malignant neoplasm of breast: Secondary | ICD-10-CM | POA: Insufficient documentation

## 2012-12-25 DIAGNOSIS — C50911 Malignant neoplasm of unspecified site of right female breast: Secondary | ICD-10-CM

## 2012-12-25 LAB — CBC WITH DIFFERENTIAL/PLATELET
Basophils Relative: 0 % (ref 0–1)
Eosinophils Absolute: 0 10*3/uL (ref 0.0–0.7)
Eosinophils Relative: 0 % (ref 0–5)
Lymphs Abs: 0.9 10*3/uL (ref 0.7–4.0)
MCH: 32.2 pg (ref 26.0–34.0)
MCHC: 33.2 g/dL (ref 30.0–36.0)
MCV: 96.9 fL (ref 78.0–100.0)
Platelets: 313 10*3/uL (ref 150–400)
RBC: 2.92 MIL/uL — ABNORMAL LOW (ref 3.87–5.11)
RDW: 23.1 % — ABNORMAL HIGH (ref 11.5–15.5)

## 2012-12-25 LAB — COMPREHENSIVE METABOLIC PANEL
ALT: 12 U/L (ref 0–35)
Albumin: 3.4 g/dL — ABNORMAL LOW (ref 3.5–5.2)
Calcium: 7.9 mg/dL — ABNORMAL LOW (ref 8.4–10.5)
GFR calc Af Amer: 77 mL/min — ABNORMAL LOW (ref >90)
Glucose, Bld: 139 mg/dL — ABNORMAL HIGH (ref 70–99)
Sodium: 139 mEq/L (ref 135–145)
Total Protein: 6 g/dL (ref 6.0–8.3)

## 2012-12-25 LAB — CANCER ANTIGEN 27.29: CA 27.29: 16 U/mL (ref 0–39)

## 2012-12-25 MED ORDER — DEXTROSE 5 % IV SOLN
75.0000 mg/m2 | Freq: Once | INTRAVENOUS | Status: AC
  Administered 2012-12-25: 150 mg via INTRAVENOUS
  Filled 2012-12-25: qty 15

## 2012-12-25 MED ORDER — SODIUM CHLORIDE 0.9 % IV SOLN
Freq: Once | INTRAVENOUS | Status: AC
  Administered 2012-12-25: 11:00:00 via INTRAVENOUS

## 2012-12-25 MED ORDER — HEPARIN SOD (PORK) LOCK FLUSH 100 UNIT/ML IV SOLN
INTRAVENOUS | Status: AC
  Filled 2012-12-25: qty 5

## 2012-12-25 MED ORDER — PALONOSETRON HCL INJECTION 0.25 MG/5ML
INTRAVENOUS | Status: AC
  Filled 2012-12-25: qty 5

## 2012-12-25 MED ORDER — HEPARIN SOD (PORK) LOCK FLUSH 100 UNIT/ML IV SOLN
500.0000 [IU] | Freq: Once | INTRAVENOUS | Status: AC | PRN
  Administered 2012-12-25: 500 [IU]
  Filled 2012-12-25: qty 5

## 2012-12-25 MED ORDER — SODIUM CHLORIDE 0.9 % IV SOLN
700.0000 mg | Freq: Once | INTRAVENOUS | Status: AC
  Administered 2012-12-25: 700 mg via INTRAVENOUS
  Filled 2012-12-25: qty 70

## 2012-12-25 MED ORDER — SODIUM CHLORIDE 0.9 % IV SOLN
Freq: Once | INTRAVENOUS | Status: AC
  Administered 2012-12-25: 13:00:00 via INTRAVENOUS
  Filled 2012-12-25: qty 5

## 2012-12-25 MED ORDER — PALONOSETRON HCL INJECTION 0.25 MG/5ML
0.2500 mg | Freq: Once | INTRAVENOUS | Status: AC
  Administered 2012-12-25: 0.25 mg via INTRAVENOUS

## 2012-12-25 MED ORDER — TRASTUZUMAB CHEMO INJECTION 440 MG
6.0000 mg/kg | Freq: Once | INTRAVENOUS | Status: AC
  Administered 2012-12-25: 546 mg via INTRAVENOUS
  Filled 2012-12-25: qty 26

## 2012-12-25 NOTE — Telephone Encounter (Signed)
Note already dictated today

## 2012-12-25 NOTE — Patient Instructions (Addendum)
[facility] Discharge Instructions  RECOMMENDATIONS MADE BY THE CONSULTANT AND ANY TEST RESULTS WILL BE SENT TO YOUR REFERRING PHYSICIAN.  Please increase Potassium (K-dur) to 1 tablet three times daily. A Erika Nunez prescription has been called to your pharmacy. Your Ultrasounds of your right arm and lower legs were all negative for any blood clots. We have scheduled a CT scan of your chest for tomorrow to rule out any blood clots in your lungs. Return to clinic 12/26/12 at 430 pm for Neulasta injection. Radiology for CT scan 12/26/13 at 530 pm.  Thank you for choosing [facility] to provide your oncology and hematology care.  To afford each patient quality time with our providers, please arrive at least 15 minutes before your scheduled appointment time.  With your help, our goal is to use those 15 minutes to complete the necessary work-up to ensure our physicians have the information they need to help with your evaluation and healthcare recommendations.    Effective January 1st, 2014, we ask that you re-schedule your appointment with our physicians should you arrive 10 or more minutes late for your appointment.  We strive to give you quality time with our providers, and arriving late affects you and other patients whose appointments are after yours.    Again, thank you for choosing [facility].  Our hope is that these requests will decrease the amount of time that you wait before being seen by our physicians.       _____________________________________________________________  Should you have questions after your visit to [facility], please contact our office at [phone] between the hours of 8:30 a.m. and 5:00 p.m.  Voicemails left after 4:30 p.m. will not be returned until the following business day.  For prescription refill requests, have your pharmacy contact our office with your prescription refill request.

## 2012-12-25 NOTE — Progress Notes (Signed)
This lady is a work in today because of right arm swelling of her last 3-4 days and bilateral leg swelling of the last 3-4 days or slightly longer. Neither arms nor legs are tender in the left arm is not swollen at all. So we did Dopplers that is her legs which are negative for DVT and a Doppler cigarette arm which is negative for DVT. Her right breast mass is still continue to shrink further and further so the chemotherapy is truly working well. I do worry about a clot somewhere causing swelling of the legs and right arm, sparing the left arm which be unusual. Her Port-A-Cath is on the left side so I will do a CAT scan of her chest in the morning with contrast to make sure not missing something else.

## 2012-12-25 NOTE — Progress Notes (Signed)
Not applicable

## 2012-12-26 ENCOUNTER — Ambulatory Visit (HOSPITAL_COMMUNITY)
Admission: RE | Admit: 2012-12-26 | Discharge: 2012-12-26 | Disposition: A | Discharge status: Home / Self Care | Payer: Medicaid Other | Source: Ambulatory Visit | Attending: Oncology | Admitting: Oncology

## 2012-12-26 ENCOUNTER — Encounter (HOSPITAL_BASED_OUTPATIENT_CLINIC_OR_DEPARTMENT_OTHER): Payer: Medicaid Other

## 2012-12-26 VITALS — BP 130/48 | HR 89 | Temp 97.8°F | Resp 20

## 2012-12-26 DIAGNOSIS — M7989 Other specified soft tissue disorders: Secondary | ICD-10-CM | POA: Insufficient documentation

## 2012-12-26 DIAGNOSIS — C50911 Malignant neoplasm of unspecified site of right female breast: Secondary | ICD-10-CM

## 2012-12-26 DIAGNOSIS — C50919 Malignant neoplasm of unspecified site of unspecified female breast: Secondary | ICD-10-CM

## 2012-12-26 DIAGNOSIS — Z5189 Encounter for other specified aftercare: Secondary | ICD-10-CM

## 2012-12-26 DIAGNOSIS — C773 Secondary and unspecified malignant neoplasm of axilla and upper limb lymph nodes: Secondary | ICD-10-CM

## 2012-12-26 DIAGNOSIS — Z9221 Personal history of antineoplastic chemotherapy: Secondary | ICD-10-CM | POA: Insufficient documentation

## 2012-12-26 MED ORDER — IOHEXOL 300 MG/ML  SOLN
80.0000 mL | Freq: Once | INTRAMUSCULAR | Status: AC | PRN
  Administered 2012-12-26: 80 mL via INTRAVENOUS

## 2012-12-26 MED ORDER — PEGFILGRASTIM INJECTION 6 MG/0.6ML
6.0000 mg | Freq: Once | SUBCUTANEOUS | Status: AC
  Administered 2012-12-26: 6 mg via SUBCUTANEOUS

## 2012-12-26 MED ORDER — PEGFILGRASTIM INJECTION 6 MG/0.6ML
SUBCUTANEOUS | Status: AC
  Filled 2012-12-26: qty 0.6

## 2012-12-26 NOTE — Progress Notes (Signed)
Erika Nunez presents today for injection per MD orders. Neulasta 6mg  administered SQ in left Abdomen. Administration without incident. Patient tolerated well.

## 2012-12-31 ENCOUNTER — Inpatient Hospital Stay (HOSPITAL_COMMUNITY)
Admission: EM | Admit: 2012-12-31 | Discharge: 2013-01-04 | DRG: 602 | Disposition: A | Discharge status: Home Health | Payer: Medicaid Other | Attending: Internal Medicine | Admitting: Internal Medicine

## 2012-12-31 ENCOUNTER — Encounter (HOSPITAL_COMMUNITY): Payer: Self-pay | Admitting: *Deleted

## 2012-12-31 ENCOUNTER — Emergency Department (HOSPITAL_COMMUNITY): Payer: Medicaid Other

## 2012-12-31 DIAGNOSIS — Z6828 Body mass index (BMI) 28.0-28.9, adult: Secondary | ICD-10-CM

## 2012-12-31 DIAGNOSIS — D649 Anemia, unspecified: Secondary | ICD-10-CM

## 2012-12-31 DIAGNOSIS — C50919 Malignant neoplasm of unspecified site of unspecified female breast: Secondary | ICD-10-CM | POA: Diagnosis present

## 2012-12-31 DIAGNOSIS — I10 Essential (primary) hypertension: Secondary | ICD-10-CM | POA: Diagnosis present

## 2012-12-31 DIAGNOSIS — M79601 Pain in right arm: Secondary | ICD-10-CM

## 2012-12-31 DIAGNOSIS — D696 Thrombocytopenia, unspecified: Secondary | ICD-10-CM | POA: Diagnosis present

## 2012-12-31 DIAGNOSIS — C50911 Malignant neoplasm of unspecified site of right female breast: Secondary | ICD-10-CM

## 2012-12-31 DIAGNOSIS — D61810 Antineoplastic chemotherapy induced pancytopenia: Secondary | ICD-10-CM | POA: Diagnosis present

## 2012-12-31 DIAGNOSIS — F411 Generalized anxiety disorder: Secondary | ICD-10-CM | POA: Diagnosis present

## 2012-12-31 DIAGNOSIS — M129 Arthropathy, unspecified: Secondary | ICD-10-CM | POA: Diagnosis present

## 2012-12-31 DIAGNOSIS — L03113 Cellulitis of right upper limb: Secondary | ICD-10-CM

## 2012-12-31 DIAGNOSIS — Z79899 Other long term (current) drug therapy: Secondary | ICD-10-CM

## 2012-12-31 DIAGNOSIS — M7989 Other specified soft tissue disorders: Secondary | ICD-10-CM | POA: Diagnosis present

## 2012-12-31 DIAGNOSIS — Z8249 Family history of ischemic heart disease and other diseases of the circulatory system: Secondary | ICD-10-CM

## 2012-12-31 DIAGNOSIS — E876 Hypokalemia: Secondary | ICD-10-CM | POA: Diagnosis present

## 2012-12-31 DIAGNOSIS — IMO0002 Reserved for concepts with insufficient information to code with codable children: Principal | ICD-10-CM | POA: Diagnosis present

## 2012-12-31 DIAGNOSIS — Z9221 Personal history of antineoplastic chemotherapy: Secondary | ICD-10-CM

## 2012-12-31 DIAGNOSIS — R112 Nausea with vomiting, unspecified: Secondary | ICD-10-CM | POA: Diagnosis present

## 2012-12-31 DIAGNOSIS — Z800 Family history of malignant neoplasm of digestive organs: Secondary | ICD-10-CM

## 2012-12-31 DIAGNOSIS — E669 Obesity, unspecified: Secondary | ICD-10-CM | POA: Diagnosis present

## 2012-12-31 DIAGNOSIS — Z23 Encounter for immunization: Secondary | ICD-10-CM

## 2012-12-31 DIAGNOSIS — Z823 Family history of stroke: Secondary | ICD-10-CM

## 2012-12-31 DIAGNOSIS — T451X5A Adverse effect of antineoplastic and immunosuppressive drugs, initial encounter: Secondary | ICD-10-CM | POA: Diagnosis present

## 2012-12-31 DIAGNOSIS — D709 Neutropenia, unspecified: Secondary | ICD-10-CM | POA: Diagnosis present

## 2012-12-31 DIAGNOSIS — E7800 Pure hypercholesterolemia, unspecified: Secondary | ICD-10-CM | POA: Diagnosis present

## 2012-12-31 HISTORY — DX: Antineoplastic chemotherapy induced pancytopenia: D61.810

## 2012-12-31 HISTORY — DX: Adverse effect of antineoplastic and immunosuppressive drugs, initial encounter: T45.1X5A

## 2012-12-31 LAB — CBC WITH DIFFERENTIAL/PLATELET
Basophils Absolute: 0 10*3/uL (ref 0.0–0.1)
Eosinophils Absolute: 0 10*3/uL (ref 0.0–0.7)
Lymphs Abs: 0.3 10*3/uL — ABNORMAL LOW (ref 0.7–4.0)
MCH: 33.9 pg (ref 26.0–34.0)
Neutrophils Relative %: 76 % (ref 43–77)
Platelets: 88 10*3/uL — ABNORMAL LOW (ref 150–400)
RBC: 2.48 MIL/uL — ABNORMAL LOW (ref 3.87–5.11)
RDW: 20.2 % — ABNORMAL HIGH (ref 11.5–15.5)
WBC: 1.6 10*3/uL — ABNORMAL LOW (ref 4.0–10.5)

## 2012-12-31 LAB — COMPREHENSIVE METABOLIC PANEL
ALT: 16 U/L (ref 0–35)
AST: 11 U/L (ref 0–37)
Albumin: 3.1 g/dL — ABNORMAL LOW (ref 3.5–5.2)
Alkaline Phosphatase: 64 U/L (ref 39–117)
GFR calc Af Amer: 76 mL/min — ABNORMAL LOW (ref >90)
Glucose, Bld: 112 mg/dL — ABNORMAL HIGH (ref 70–99)
Potassium: 4.3 mEq/L (ref 3.5–5.1)
Sodium: 133 mEq/L — ABNORMAL LOW (ref 135–145)
Total Protein: 5.9 g/dL — ABNORMAL LOW (ref 6.0–8.3)

## 2012-12-31 MED ORDER — OXYCODONE-ACETAMINOPHEN 5-325 MG PO TABS
1.0000 | ORAL_TABLET | Freq: Once | ORAL | Status: AC
  Administered 2012-12-31: 1 via ORAL
  Filled 2012-12-31: qty 1

## 2012-12-31 MED ORDER — ONDANSETRON HCL 4 MG PO TABS: 4.0000 mg | ORAL_TABLET | Freq: Four times a day (QID) | ORAL | Status: DC | PRN

## 2012-12-31 MED ORDER — SODIUM CHLORIDE 0.9 % IJ SOLN
3.0000 mL | Freq: Two times a day (BID) | INTRAMUSCULAR | Status: DC
  Administered 2012-12-31 – 2013-01-04 (×5): 3 mL via INTRAVENOUS

## 2012-12-31 MED ORDER — LORAZEPAM 1 MG PO TABS
1.0000 mg | ORAL_TABLET | ORAL | Status: DC | PRN
  Administered 2013-01-03 – 2013-01-04 (×4): 1 mg via ORAL
  Filled 2012-12-31 (×4): qty 1

## 2012-12-31 MED ORDER — ONDANSETRON HCL 4 MG/2ML IJ SOLN
4.0000 mg | Freq: Four times a day (QID) | INTRAMUSCULAR | Status: DC | PRN
  Administered 2012-12-31 – 2013-01-04 (×7): 4 mg via INTRAVENOUS
  Filled 2012-12-31 (×7): qty 2

## 2012-12-31 MED ORDER — LISINOPRIL 10 MG PO TABS
20.0000 mg | ORAL_TABLET | Freq: Every day | ORAL | Status: DC
  Administered 2013-01-02 – 2013-01-04 (×3): 20 mg via ORAL
  Filled 2012-12-31 (×2): qty 2

## 2012-12-31 MED ORDER — VANCOMYCIN HCL IN DEXTROSE 1-5 GM/200ML-% IV SOLN
INTRAVENOUS | Status: AC
  Filled 2012-12-31: qty 200

## 2012-12-31 MED ORDER — ALUM & MAG HYDROXIDE-SIMETH 200-200-20 MG/5ML PO SUSP: 30.0000 mL | Freq: Four times a day (QID) | ORAL | Status: DC | PRN

## 2012-12-31 MED ORDER — VANCOMYCIN HCL IN DEXTROSE 1-5 GM/200ML-% IV SOLN
1000.0000 mg | Freq: Two times a day (BID) | INTRAVENOUS | Status: DC
  Administered 2012-12-31 – 2013-01-02 (×4): 1000 mg via INTRAVENOUS
  Filled 2012-12-31 (×4): qty 200

## 2012-12-31 MED ORDER — POLYETHYLENE GLYCOL 3350 17 G PO PACK: 17.0000 g | PACK | Freq: Every day | ORAL | Status: DC | PRN

## 2012-12-31 MED ORDER — HYDROCODONE-ACETAMINOPHEN 5-325 MG PO TABS
1.0000 | ORAL_TABLET | ORAL | Status: DC | PRN
  Administered 2013-01-01 – 2013-01-03 (×2): 2 via ORAL
  Filled 2012-12-31 (×2): qty 2

## 2012-12-31 MED ORDER — HYDROMORPHONE HCL PF 1 MG/ML IJ SOLN
0.5000 mg | INTRAMUSCULAR | Status: DC | PRN
  Administered 2012-12-31 – 2013-01-02 (×5): 0.5 mg via INTRAVENOUS
  Filled 2012-12-31 (×5): qty 1

## 2012-12-31 MED ORDER — SODIUM CHLORIDE 0.9 % IV SOLN
INTRAVENOUS | Status: AC
  Filled 2012-12-31 (×2): qty 3

## 2012-12-31 MED ORDER — SODIUM CHLORIDE 0.9 % IJ SOLN
3.0000 mL | INTRAMUSCULAR | Status: DC | PRN
  Filled 2012-12-31: qty 3

## 2012-12-31 MED ORDER — SODIUM CHLORIDE 0.9 % IV SOLN: 250.0000 mL | INTRAVENOUS | Status: DC | PRN

## 2012-12-31 MED ORDER — SERTRALINE HCL 50 MG PO TABS
150.0000 mg | ORAL_TABLET | Freq: Every day | ORAL | Status: DC
  Administered 2013-01-01 – 2013-01-04 (×4): 150 mg via ORAL
  Filled 2012-12-31 (×4): qty 3

## 2012-12-31 MED ORDER — HYDROCHLOROTHIAZIDE 25 MG PO TABS
25.0000 mg | ORAL_TABLET | Freq: Every day | ORAL | Status: DC
  Administered 2013-01-01 – 2013-01-04 (×4): 25 mg via ORAL
  Filled 2012-12-31 (×4): qty 1

## 2012-12-31 MED ORDER — SIMVASTATIN 20 MG PO TABS
40.0000 mg | ORAL_TABLET | Freq: Every day | ORAL | Status: DC
  Administered 2012-12-31 – 2013-01-03 (×3): 40 mg via ORAL
  Filled 2012-12-31 (×4): qty 2

## 2012-12-31 MED ORDER — LISINOPRIL 10 MG PO TABS
20.0000 mg | ORAL_TABLET | Freq: Every day | ORAL | Status: DC
  Administered 2013-01-01: 20 mg via ORAL
  Filled 2012-12-31 (×2): qty 2

## 2012-12-31 MED ORDER — ACETAMINOPHEN 650 MG RE SUPP: 650.0000 mg | Freq: Four times a day (QID) | RECTAL | Status: DC | PRN

## 2012-12-31 MED ORDER — LISINOPRIL-HYDROCHLOROTHIAZIDE 20-25 MG PO TABS: 1.0000 | ORAL_TABLET | Freq: Every day | ORAL | Status: DC

## 2012-12-31 MED ORDER — ACETAMINOPHEN 325 MG PO TABS: 650.0000 mg | ORAL_TABLET | Freq: Four times a day (QID) | ORAL | Status: DC | PRN

## 2012-12-31 MED ORDER — POTASSIUM CHLORIDE CRYS ER 20 MEQ PO TBCR
20.0000 meq | EXTENDED_RELEASE_TABLET | Freq: Three times a day (TID) | ORAL | Status: DC
  Administered 2012-12-31 – 2013-01-03 (×8): 20 meq via ORAL
  Filled 2012-12-31 (×10): qty 1

## 2012-12-31 MED ORDER — PNEUMOCOCCAL VAC POLYVALENT 25 MCG/0.5ML IJ INJ
0.5000 mL | INJECTION | INTRAMUSCULAR | Status: AC
  Administered 2013-01-01: 0.5 mL via INTRAMUSCULAR
  Filled 2012-12-31: qty 0.5

## 2012-12-31 MED ORDER — ZOLPIDEM TARTRATE 5 MG PO TABS
5.0000 mg | ORAL_TABLET | Freq: Every evening | ORAL | Status: DC | PRN
  Administered 2013-01-03: 5 mg via ORAL
  Filled 2012-12-31: qty 1

## 2012-12-31 MED ORDER — SODIUM CHLORIDE 0.9 % IV SOLN
3.0000 g | Freq: Four times a day (QID) | INTRAVENOUS | Status: DC
  Administered 2012-12-31 – 2013-01-04 (×14): 3 g via INTRAVENOUS
  Filled 2012-12-31 (×17): qty 3

## 2012-12-31 MED ORDER — ENOXAPARIN SODIUM 40 MG/0.4ML ~~LOC~~ SOLN
40.0000 mg | SUBCUTANEOUS | Status: DC
Start: 2012-12-31 — End: 2013-01-04
  Administered 2012-12-31 – 2013-01-03 (×4): 40 mg via SUBCUTANEOUS
  Filled 2012-12-31 (×4): qty 0.4

## 2012-12-31 NOTE — H&P (Signed)
History and Physical  Erika Nunez WUJ:[id number] DOB: [birth year] DOA: 12/31/2012  Referring physician: Bethann Berkshire, MD PCP: Randall An, MD  [facility]  Chief Complaint: right arm swelling  HPI:  62 year old woman presented with increasing right upper extremity edema, pain and erythema. Initial evaluation was notable for right upper extremity cellulitis with massive edema.  History obtained from chart review, patient, family at bedside. Since her diagnosis of breast cancer she has had intermittent edema of her right upper extremity which has waxed and waned. She is seen in the clinic 4/14 for edema and extensive workup was unremarkable as detailed below. 4/17 she developed increasing edema. Erythema developed over the last 24-48 hours and edema has massively increased. Right arm hurts especially around the elbow and mid arm. The pain is intense and limits her movements.  In the emergency department was noted to be afebrile, tachycardic but otherwise stable vital signs. Complete metabolic panel is unremarkable. CBC notable for white blood cell count 1.6, hemoglobin 8.4, platelet count 88. Right elbow film was negative.  Chart Review:  Oncology office visit 4/14: Worked in for right arm swelling and bilateral lower extremity edema. Lower extremity Dopplers were negative for DVT. Doppler right arm was negative for DVT. CT chest revealed significant decrease in size of right breast masses. Metastatic adenopathy resolved. No blood clots seen.  Review of Systems:  Negative for fever, visual changes, sore throat, chest pain, SOB, dysuria, bleeding, n/v/abdominal pain. Otherwise as above.  Past Medical History  Diagnosis Date  . Hypertension   . High cholesterol   . Cancer   . Breast cancer   . Allergy     seasonal allergies  . Arthritis   . Anxiety   . Breast mass, left 2014  . Breast mass, right 2014    Past Surgical History  Procedure Laterality Date  .  Cholecystectomy    . Tubal ligation    . Tonsillectomy    . Breast biopsy  10/02/2012    Procedure: BREAST BIOPSY;  Surgeon: Fabio Bering, MD;  Location: [facility];  Service: General;  Laterality: Right;  . Portacath placement Left 10/20/2012  . Breast biopsy Left 10/20/2012    Procedure: BREAST BIOPSY;  Surgeon: Fabio Bering, MD;  Location: [facility];  Service: General;  Laterality: Left;  Procedure end 1142  . Portacath placement Left 10/20/2012    Procedure: INSERTION PORT-A-CATH;  Surgeon: Fabio Bering, MD;  Location: [facility];  Service: General;  Laterality: Left;  Procedure began 1143; left subclavian    Social History:  reports that she has never smoked. She has never used smokeless tobacco. She reports that she drinks about 0.6 ounces of alcohol per week. She reports that she does not use illicit drugs.  Allergies  Allergen Reactions  . Codeine Hives    Family History  Problem Relation Age of Onset  . Heart attack Father   . Cancer Maternal Aunt     colon cancer  . Stroke Maternal Uncle   . Cancer Maternal Grandmother     colon cancer  . Cancer Maternal Aunt     lung cancer  . COPD Daughter      Prior to Admission medications   Medication Sig Start Date End Date Taking? Authorizing Provider  CARBOPLATIN IV Inject into the vein every 21 ( twenty-one) days.   Yes Historical Provider, MD  dexamethasone (DECADRON) 4 MG tablet Take 8 mg by mouth. The day before, day of, and day after  chemo take 2 tablets in the am and 2 tablets in the pm. Then stop. Repeat this with each chemo cycle. Refer to your calendar.  (this medication is being given to reduce the risk of fluid retention from occurring due to the Taxotere chemo you are receiving).   Yes Historical Provider, MD  DOCEtaxel (TAXOTERE IV) Inject into the vein every 21 ( twenty-one) days.   Yes Historical Provider, MD  HYDROcodone-acetaminophen (NORCO/VICODIN) 5-325 MG per tablet Take 1-2 tablets by mouth every 4 (four) hours as  needed for pain. 11/27/12  Yes Maurine Minister Kefalas, PA-C  ibuprofen (ADVIL,MOTRIN) 200 MG tablet Take 200 mg by mouth every 6 (six) hours as needed. Pain.   Yes Historical Provider, MD  lidocaine-prilocaine (EMLA) cream Apply topically once. Apply a quarter sized amount to port site 1 hour prior to chemo. Do not rub in. Cover with plastic.   Yes Historical Provider, MD  lisinopril (PRINIVIL,ZESTRIL) 20 MG tablet Take 20 mg by mouth daily.   Yes Historical Provider, MD  lisinopril-hydrochlorothiazide (PRINZIDE,ZESTORETIC) 20-25 MG per tablet Take 1 tablet by mouth daily.   Yes Historical Provider, MD  LORazepam (ATIVAN) 1 MG tablet Take 1 tablet (1 mg total) by mouth every 4 (four) hours as needed. For nausea/vomiting. Do not drive while taking this medication. This medication may make you drowsy or sleepy. Change positions slowly. 11/27/12  Yes Maurine Minister Kefalas, PA-C  ondansetron (ZOFRAN) 8 MG tablet Take 8 mg by mouth. Starting the day after chemo, take 1 tablet in the am and 1 tablet in the pm for 2 days. Then may take 1 tablet two times a day IF needed for nausea/vomiting.   Yes Historical Provider, MD  pegfilgrastim (NEULASTA) 6 MG/0.6ML injection Inject 6 mg into the skin every 21 ( twenty-one) days. To be given 20-24 hours after the completion of chemo @@ the Cancer Center. Potential Side Effect is bone pain.   Yes Historical Provider, MD  potassium chloride SA (K-DUR,KLOR-CON) 20 MEQ tablet Take 20 mEq by mouth 3 (three) times daily.   Yes Historical Provider, MD  pravastatin (PRAVACHOL) 80 MG tablet Take 80 mg by mouth at bedtime.   Yes Historical Provider, MD  prochlorperazine (COMPAZINE) 10 MG tablet Take 1 tablet (10 mg total) by mouth every 6 (six) hours as needed. 11/09/12  Yes Ellouise Newer, PA-C  prochlorperazine (COMPAZINE) 25 MG suppository Place 1 suppository (25 mg total) rectally every 6 (six) hours as needed for nausea. 10/15/12  Yes Randall An, MD  sertraline (ZOLOFT) 100 MG tablet  Take 150 mg by mouth daily.   Yes Historical Provider, MD  Trastuzumab (HERCEPTIN IV) Inject into the vein every 21 ( twenty-one) days.   Yes Historical Provider, MD   Physical Exam: Filed Vitals:   12/31/12 1408  BP: 126/65  Pulse: 120  Temp: 98.7 F (37.1 C)  TempSrc: Oral  Resp: 18  Height: 5\' 7"  (1.702 m)  Weight: 83.915 kg (185 lb)  SpO2: 96%   General:  Examined in the emergency department. Appears calm and comfortable Eyes: PERRL, normal lids, irises ENT: grossly normal hearing, lips & tongue Neck: no LAD, masses or thyromegaly Right breast: Wound packed. Remainder of breast appears unremarkable with edema or erythema. Cardiovascular: RRR, no m/r/g. No LE edema. Respiratory: CTA bilaterally, no w/r/r. Normal respiratory effort. Abdomen: soft, ntnd Skin: Right upper extremity with massive edema from shoulder to hand. Erythema extending from shoulder to distal forearm. Mild tenderness, no fluctuance. Range of motion  mildly limited by edema and pain. No lesions or break in skin seen. Hand uninvolved. Distal sensation intact. Motor intact distally and proximally. Musculoskeletal: grossly normal tone BUE/BLE Psychiatric: grossly normal mood and affect, speech fluent and appropriate Neurologic: grossly non-focal.  Wt Readings from Last 3 Encounters:  12/31/12 83.915 kg (185 lb)  12/01/12 87.952 kg (193 lb 14.4 oz)  11/13/12 88.996 kg (196 lb 3.2 oz)    Labs on Admission:  Basic Metabolic Panel:  Recent Labs Lab 12/25/12 0935 12/31/12 1516  NA 139 133*  K 3.0* 4.3  CL 101 96  CO2 25 27  GLUCOSE 139* 112*  BUN 12 13  CREATININE 0.91 0.92  CALCIUM 7.9* 8.2*    Liver Function Tests:  Recent Labs Lab 12/25/12 0935 12/31/12 1516  AST 11 11  ALT 12 16  ALKPHOS 62 64  BILITOT 0.2* 0.9  PROT 6.0 5.9*  ALBUMIN 3.4* 3.1*   CBC:  Recent Labs Lab 12/25/12 0935 12/31/12 1516  WBC 21.1* 1.6*  NEUTROABS 19.2* 1.2*  HGB 9.4* 8.4*  HCT 28.3* 24.2*  MCV 96.9  97.6  PLT 313 88*   Radiological Exams on Admission: Dg Elbow Complete Right  12/31/2012  *RADIOLOGY REPORT*  Clinical Data: Right elbow swelling, pain.  RIGHT ELBOW - COMPLETE 3+ VIEW  Comparison: None.  Findings: Diffuse soft tissue swelling about the right elbow.  No underlying bony abnormality.  No fracture, subluxation or dislocation.  No joint effusion.  No radiopaque foreign bodies.  IMPRESSION: No acute bony abnormality.   Original Report Authenticated By: Charlett Nose, M.D.     Principal Problem:   Cellulitis of right upper extremity Active Problems:   Breast cancer   Hypertension   Assessment/Plan 1. Right upper extremity cellulitis: Broad-spectrum antibiotics, currently immunocompromised. No evidence of sepsis. Venous Doppler several days ago was negative. 2. Right breast cancer currently undergoing chemotherapy: Responding well as noted by the recent CT scan. Will notify Dr. Mariel Sleet of admission. 3. Reason bilateral lower extremity swelling: Etiology unclear. Currently resolved.  4. Hypertension: Stable.  Code Status: Full code Family Communication: Discussed with daughter, son-in-law bedside Disposition Plan/Anticipated LOS: Admit. 2-4 days.  Time spent: 50 minutes  Brendia Sacks, MD  [facility] Pager [phone] 12/31/2012, 4:36 PM

## 2012-12-31 NOTE — Progress Notes (Signed)
ANTIBIOTIC CONSULT NOTE - INITIAL  Pharmacy Consult for Vancomycin & Unasyn  Indication: cellulitis RUE  Allergies  Allergen Reactions  . Codeine Hives    Patient Measurements: Height: 5\' 7"  (170.2 cm) Weight: 185 lb (83.915 kg) IBW/kg (Calculated) : 61.6   Vital Signs: Temp: 98.7 F (37.1 C) (04/20 1408) Temp src: Oral (04/20 1408) BP: 119/59 mmHg (04/20 1652) Pulse Rate: 110 (04/20 1652) Intake/Output from previous day:   Intake/Output from this shift:    Labs:  Recent Labs  12/31/12 1516  WBC 1.6*  HGB 8.4*  PLT 88*  CREATININE 0.92   Estimated Creatinine Clearance: 70.6 ml/min (by C-G formula based on Cr of 0.92). No results found for this basename: VANCOTROUGH, VANCOPEAK, VANCORANDOM, GENTTROUGH, GENTPEAK, GENTRANDOM, TOBRATROUGH, TOBRAPEAK, TOBRARND, AMIKACINPEAK, AMIKACINTROU, AMIKACIN,  in the last 72 hours   Microbiology: No results found for this or any previous visit (from the past 720 hour(s)).  Medical History: Past Medical History  Diagnosis Date  . Hypertension   . High cholesterol   . Cancer   . Breast cancer   . Allergy     seasonal allergies  . Arthritis   . Anxiety   . Breast mass, left 2014  . Breast mass, right 2014    Medications:  Scheduled:  . [COMPLETED] oxyCODONE-acetaminophen  1 tablet Oral Once   Assessment: Excellent renal function   Goal of Therapy:  Vancomycin trough level 10-15 mcg/ml  Plan:  Vancomycin 1 GM IV every 12 hours Unasyn 3 GM IV every 6 hours Vancomycin trough at steady state Monitor renal function Labs per protocol  Raquel James, Terez Montee Bennett 12/31/2012,5:13 PM

## 2012-12-31 NOTE — ED Notes (Signed)
Pt states that she has had swelling to right arm for "awhile" was seen in ultrasound this week for ? Blood clot, ultrasound negative, presents to er today with swelling that has become worse since last night, redness that has increased, warm to touch in area of redness.

## 2012-12-31 NOTE — ED Provider Notes (Signed)
History    This chart was scribed for Erika Lennert, MD by Erika Nunez, ED Scribe. The patient was seen in room [facility] and the patient's care was started at 3:10PM.    CSN: [id number]  Arrival date & time 12/31/12  1352   First MD Initiated Contact with Patient 12/31/12 1507      Chief Complaint  Patient presents with  . Extremity Pain    (Consider location/radiation/quality/duration/timing/severity/associated sxs/prior treatment) Patient is a 62 y.o. female presenting with extremity pain. The history is provided by the patient. No language interpreter was used.  Extremity Pain This is a chronic problem. The problem occurs constantly. The problem has been gradually worsening. Associated symptoms comments: Extremity swelling. The symptoms are aggravated by bending and twisting. Nothing relieves the symptoms. She has tried nothing for the symptoms. The treatment provided no relief.   Erika Nunez is a 62 y.o. female who presents to the Emergency Department complaining of constant, moderate to severe right forearm pain with an onset 5 days ago. She has a history of breast cancer and is currently undergoing chemotherapy and treatment. Last chemotherapy treatment was a week ago. She also has a history of mild right arm pain and swelling and bilateral lower extremity swelling. She underwent an ultrasound of her arm last week and was negative for blood clots per her report. She also underwent a chest study which she reports was normal. Today, she reports that her right arm swelling has gotten progressively worse since yesterday with increased redness since her last PCP visit last week. She reports her right arm swelling has never been this severe before. Moving and bending the arm aggravates the pain. Allergic to codeine. No other pertinent medical symptoms.  PCP: Dr Erika Nunez; last visit was 5 days ago before present symptom began  Past Medical History  Diagnosis Date  .  Hypertension   . High cholesterol   . Cancer   . Breast cancer   . Allergy     seasonal allergies  . Arthritis   . Anxiety   . Breast mass, left 2014  . Breast mass, right 2014    Past Surgical History  Procedure Laterality Date  . Cholecystectomy    . Tubal ligation    . Tonsillectomy    . Breast biopsy  10/02/2012    Procedure: BREAST BIOPSY;  Surgeon: Fabio Bering, MD;  Location: [facility];  Service: General;  Laterality: Right;  . Portacath placement Left 10/20/2012  . Breast biopsy Left 10/20/2012    Procedure: BREAST BIOPSY;  Surgeon: Fabio Bering, MD;  Location: [facility];  Service: General;  Laterality: Left;  Procedure end 1142  . Portacath placement Left 10/20/2012    Procedure: INSERTION PORT-A-CATH;  Surgeon: Fabio Bering, MD;  Location: [facility];  Service: General;  Laterality: Left;  Procedure began 1143; left subclavian    Family History  Problem Relation Age of Onset  . Heart attack Father   . Cancer Maternal Aunt     colon cancer  . Stroke Maternal Uncle   . Cancer Maternal Grandmother     colon cancer  . Cancer Maternal Aunt     lung cancer  . COPD Daughter     History  Substance Use Topics  . Smoking status: Never Smoker   . Smokeless tobacco: Never Used  . Alcohol Use: 0.6 oz/week    1 Glasses of wine per week    OB History   Grav Para Term Preterm  Abortions TAB SAB Ect Mult Living                  Review of Systems  Constitutional: Negative for fever and chills.  Musculoskeletal: Positive for myalgias and arthralgias.       (+) Right arm swelling  All other systems reviewed and are negative.   10 Systems reviewed and all are negative for acute change except as noted in the HPI.   Allergies  Codeine  Home Medications   Current Outpatient Rx  Name  Route  Sig  Dispense  Refill  . CARBOPLATIN IV   Intravenous   Inject into the vein every 21 ( twenty-one) days.         Marland Kitchen dexamethasone (DECADRON) 4 MG tablet   Oral   Take 8 mg by  mouth. The day before, day of, and day after chemo take 2 tablets in the am and 2 tablets in the pm. Then stop. Repeat this with each chemo cycle. Refer to your calendar.  (this medication is being given to reduce the risk of fluid retention from occurring due to the Taxotere chemo you are receiving).         . DOCEtaxel (TAXOTERE IV)   Intravenous   Inject into the vein every 21 ( twenty-one) days.         Marland Kitchen HYDROcodone-acetaminophen (NORCO/VICODIN) 5-325 MG per tablet   Oral   Take 1-2 tablets by mouth every 4 (four) hours as needed for pain.         Marland Kitchen ibuprofen (ADVIL,MOTRIN) 200 MG tablet   Oral   Take 200 mg by mouth every 6 (six) hours as needed. Pain.         . lidocaine-prilocaine (EMLA) cream   Topical   Apply topically once. Apply a quarter sized amount to port site 1 hour prior to chemo. Do not rub in. Cover with plastic.         Marland Kitchen lisinopril (PRINIVIL,ZESTRIL) 20 MG tablet   Oral   Take 20 mg by mouth daily.         Marland Kitchen lisinopril-hydrochlorothiazide (PRINZIDE,ZESTORETIC) 20-25 MG per tablet   Oral   Take 1 tablet by mouth daily.         Marland Kitchen LORazepam (ATIVAN) 1 MG tablet   Oral   Take 1 tablet (1 mg total) by mouth every 4 (four) hours as needed. For nausea/vomiting. Do not drive while taking this medication. This medication may make you drowsy or sleepy. Change positions slowly.   30 tablet   1   . ondansetron (ZOFRAN) 8 MG tablet   Oral   Take 8 mg by mouth. Starting the day after chemo, take 1 tablet in the am and 1 tablet in the pm for 2 days. Then may take 1 tablet two times a day IF needed for nausea/vomiting.         . pegfilgrastim (NEULASTA) 6 MG/0.6ML injection   Subcutaneous   Inject 6 mg into the skin every 21 ( twenty-one) days. To be given 20-24 hours after the completion of chemo @@ the Cancer Center. Potential Side Effect is bone pain.         . potassium chloride SA (K-DUR,KLOR-CON) 20 MEQ tablet   Oral   Take 20 mEq by mouth 3  (three) times daily.         . pravastatin (PRAVACHOL) 80 MG tablet   Oral   Take 80 mg by mouth at bedtime.         Marland Kitchen  prochlorperazine (COMPAZINE) 10 MG tablet   Oral   Take 1 tablet (10 mg total) by mouth every 6 (six) hours as needed.   30 tablet   1   . prochlorperazine (COMPAZINE) 25 MG suppository   Rectal   Place 1 suppository (25 mg total) rectally every 6 (six) hours as needed for nausea.   12 suppository   2   . sertraline (ZOLOFT) 100 MG tablet   Oral   Take 150 mg by mouth daily.         . Trastuzumab (HERCEPTIN IV)   Intravenous   Inject into the vein every 21 ( twenty-one) days.           BP 126/65  Pulse 120  Temp(Src) 98.7 F (37.1 C) (Oral)  Resp 18  Ht 5\' 7"  (1.702 m)  Wt 185 lb (83.915 kg)  BMI 28.97 kg/m2  SpO2 96%  Physical Exam  Nursing note and vitals reviewed. Constitutional: She is oriented to person, place, and time. She appears well-developed.  HENT:  Head: Normocephalic.  Eyes: Conjunctivae are normal.  Neck: No tracheal deviation present.  Cardiovascular:  No murmur heard. Musculoskeletal: Normal range of motion. She exhibits edema and tenderness.  Right arm: Severe swelling from the mid humerus distally with tenderness around the elbow with erythema and inflammation. Left arm is normal. Bilateral arms have good pulses and strength and sensation is intact.  Neurological: She is oriented to person, place, and time.  Skin: Skin is warm.  Psychiatric: She has a normal mood and affect.    ED Course  Procedures (including critical care time)  DIAGNOSTIC STUDIES: Oxygen Saturation is 96% on room air, adequate by my interpretation.    COORDINATION OF CARE:  3:15PM - percocet, right elbow XR, CBC with differential, and CMP will be ordered for Erika Nunez.   4:20PM - lab results reviewed Labs Reviewed  CBC WITH DIFFERENTIAL - Abnormal; Notable for the following:    WBC 1.6 (*)    RBC 2.48 (*)    Hemoglobin 8.4 (*)     HCT 24.2 (*)    RDW 20.2 (*)    Platelets 88 (*)    Neutro Abs 1.2 (*)    Lymphs Abs 0.3 (*)    All other components within normal limits  COMPREHENSIVE METABOLIC PANEL - Abnormal; Notable for the following:    Sodium 133 (*)    Glucose, Bld 112 (*)    Calcium 8.2 (*)    Total Protein 5.9 (*)    Albumin 3.1 (*)    GFR calc non Af Amer 65 (*)    GFR calc Af Amer 76 (*)    All other components within normal limits    4:20PM - imaging results reviewed and is unremarkable. Dg Elbow Complete Right  12/31/2012  *RADIOLOGY REPORT*  Clinical Data: Right elbow swelling, pain.  RIGHT ELBOW - COMPLETE 3+ VIEW  Comparison: None.  Findings: Diffuse soft tissue swelling about the right elbow.  No underlying bony abnormality.  No fracture, subluxation or dislocation.  No joint effusion.  No radiopaque foreign bodies.  IMPRESSION: No acute bony abnormality.   Original Report Authenticated By: Charlett Nose, M.D.    4:25PM - recheck; based on her present lab results, she will be admitted to the hospital today. Consult to admitting physician will be performed.  No diagnosis found.    MDM     The chart was scribed for me under my direct supervision.  I personally performed  the history, physical, and medical decision making and all procedures in the evaluation of this patient.Erika Lennert, MD 12/31/12 [phone]

## 2012-12-31 NOTE — ED Notes (Signed)
Marked swelling noted to right upper extremity with erythema and warmth.  Pt states that she had some minor swelling to her right arm on Friday with her chemotherapy.  States that Dr. Mariel Sleet sent her for vascular studies and a CT scan to rule out clots, states that the tests were negative.  The patient states that the swelling and redness have continued and are much worse today than on Friday.

## 2013-01-01 ENCOUNTER — Ambulatory Visit (HOSPITAL_COMMUNITY): Payer: Medicaid Other | Admitting: Oncology

## 2013-01-01 DIAGNOSIS — C50919 Malignant neoplasm of unspecified site of unspecified female breast: Secondary | ICD-10-CM

## 2013-01-01 DIAGNOSIS — T451X5A Adverse effect of antineoplastic and immunosuppressive drugs, initial encounter: Secondary | ICD-10-CM

## 2013-01-01 DIAGNOSIS — D702 Other drug-induced agranulocytosis: Secondary | ICD-10-CM

## 2013-01-01 LAB — BASIC METABOLIC PANEL
BUN: 11 mg/dL (ref 6–23)
Chloride: 96 mEq/L (ref 96–112)
GFR calc Af Amer: >90 mL/min (ref >90)
Potassium: 3.9 mEq/L (ref 3.5–5.1)

## 2013-01-01 NOTE — Progress Notes (Signed)
[facility] PROGRESS NOTE  Erika Nunez ZOX:[id number] DOB: [birth year] DOA: 12/31/2012 PCP: Randall An, MD  Assessment/Plan: Cellulitis of right upper extremity: remains extremely swollen with erythema and warmth. Continue Vanc and unasyn day #2. Afebrile, VSS. Dopplers 4/15 neg clot.   Active Problems:   Breast cancer: Dr. Thornton Papas office notified of admission.  Most recent chemo 4/15. Decrease in mass noted.     Hypertension: controlled. Continue meds   Code Status: full Family Communication: daughter at bedside Disposition Plan: home when ready   Consultants:  none  Procedures:  none  Antibiotics:  Unasyn 12/31/12>>>  Vancomycin 12/31/12>>>  HPI/Subjective: Sitting up in bed feeling nauseated. States right arm remains painful but pain med helping  Objective: Filed Vitals:   12/31/12 1652 12/31/12 1828 12/31/12 2045 01/01/13 0456  BP: 119/59 108/70 124/69 116/71  Pulse: 110 105 106 104  Temp: 99 F (37.2 C) 98.7 F (37.1 C) 98.9 F (37.2 C) 98.9 F (37.2 C)  TempSrc: Oral Oral Oral Oral  Resp: 19 20 20 20   Height:      Weight:      SpO2: 96% 96% 96% 95%   No intake or output data in the 24 hours ending 01/01/13 1221 Filed Weights   12/31/12 1408  Weight: 83.915 kg (185 lb)    Exam:   General:  Well nourished NAD  Cardiovascular: RRR No MGR No LE edema  Respiratory: normal effort BS clear bilaterally no wheeze  Abdomen: soft round non-tender to palpation +BS   Musculoskeletal: no clubbing or cyanosis. Right arm very swollen with erythema throughout to hand. Tender to touch, firm. No lesions or drainage from skin. Decreased ROM due to pain.      Data Reviewed: Basic Metabolic Panel:  Recent Labs Lab 12/31/12 1516 01/01/13 0504  NA 133* 133*  K 4.3 3.9  CL 96 96  CO2 27 26  GLUCOSE 112* 104*  BUN 13 11  CREATININE 0.92 0.77  CALCIUM 8.2* 8.1*   Liver Function Tests:  Recent Labs Lab 12/31/12 1516  AST 11   ALT 16  ALKPHOS 64  BILITOT 0.9  PROT 5.9*  ALBUMIN 3.1*   No results found for this basename: LIPASE, AMYLASE,  in the last 168 hours No results found for this basename: AMMONIA,  in the last 168 hours CBC:  Recent Labs Lab 12/31/12 1516  WBC 1.6*  NEUTROABS 1.2*  HGB 8.4*  HCT 24.2*  MCV 97.6  PLT 88*   Cardiac Enzymes: No results found for this basename: CKTOTAL, CKMB, CKMBINDEX, TROPONINI,  in the last 168 hours BNP (last 3 results) No results found for this basename: PROBNP,  in the last 8760 hours CBG: No results found for this basename: GLUCAP,  in the last 168 hours  No results found for this or any previous visit (from the past 240 hour(s)).   Studies: Dg Elbow Complete Right  12/31/2012  *RADIOLOGY REPORT*  Clinical Data: Right elbow swelling, pain.  RIGHT ELBOW - COMPLETE 3+ VIEW  Comparison: None.  Findings: Diffuse soft tissue swelling about the right elbow.  No underlying bony abnormality.  No fracture, subluxation or dislocation.  No joint effusion.  No radiopaque foreign bodies.  IMPRESSION: No acute bony abnormality.   Original Report Authenticated By: Charlett Nose, M.D.     Scheduled Meds: . ampicillin-sulbactam (UNASYN) IV  3 g Intravenous Q6H  . enoxaparin (LOVENOX) injection  40 mg Subcutaneous Q24H  . lisinopril  20 mg Oral Daily   And  .  hydrochlorothiazide  25 mg Oral Daily  . lisinopril  20 mg Oral Daily  . potassium chloride SA  20 mEq Oral TID  . sertraline  150 mg Oral Daily  . simvastatin  40 mg Oral q1800  . sodium chloride  3 mL Intravenous Q12H  . vancomycin  1,000 mg Intravenous Q12H   Continuous Infusions:   Principal Problem:    Time spent: 30 minutes    [facility] M  [facility]  If 7PM-7AM, please contact night-coverage at [email or web address], password [facility] 01/01/2013, 12:21 PM  LOS: 1 day

## 2013-01-01 NOTE — Progress Notes (Signed)
Patient seen, independently examined and chart reviewed. I agree with exam, assessment and plan discussed with [name], NP.  Subjective: Somewhat better today. Somewhat less edema and less pain with movement. No paresthesias.  Objective: Afebrile, vital signs stable. Clearly less edema and erythema today in the right upper extremity although massive edema and significant erythema remains. Less tender with manipulation. Sensation intact in fingers. Grip strength limited by pain. In the left arm by self.  Labs: Basic metabolic panel unremarkable.  Acute issues:  Cellulitis right upper extremity: Slowly improving.   Plan:  Continue current antibiotic therapy  Loraine Leriche borders of erythema   Brendia Sacks, MD [facility] [phone]

## 2013-01-01 NOTE — Consult Note (Signed)
[facility] Consultation Oncology  Name: Jamilynn Whitacre      MRN: [id number]    Location: W098/J191-47  Date: 01/01/2013 Time:1:17 PM   REFERRING PHYSICIAN:  Brendia Sacks, MD  REASON FOR CONSULT:  Breast Cancer   DIAGNOSIS:  Right UE Cellulitis  HISTORY OF PRESENT ILLNESS:   Telisha is a 62 year old Caucasian female who is well-known to the [facility] where she undergoes treatment for a Stage IIIC/Stage IV, Her2+, ER-, PR-, KI-67 70%, breast cancer with Carboplatin/Docetaxol/Herceptin with Neulasta support. She is Day 8 Cycle 4 with Neulasta support on 12/26/2012.  She is tolerating therapy well thus far.  She had some nausea/vomting with cycle 1 and 2 of chemotherapy, but that is much better managed now that she has admitted to these issues with chemotherapy.   She was admitted on 12/31/2012 with cellulitis of the right arm and massive edema.  She was therefore admitted for IV antibiotics consisting of Unasyn and Vancomycin which she is tolerating well.   She reports that her right arm is improving with regards to the erythema and edema.  It is still tender to palpation, erythematous, and hot to the touch.   I personally reviewed and went over radiographic studies with the patient.  She has had a wonderful response to therapy.  As a result of this, we will consult surgery for consideration of B/L mastectomies for locoregional control of disease. We will also get a radiation oncology consultation as an outpatient.  I personally reviewed and went over laboratory results with the patient.  She is noted to be neutropenic at 1.2.  She did receive Neulasta support on 12/26/2012 and suspect her WBC will improve in the near future.   She denies any fevers, chills, shakes, nausea, and vomiting.     PAST MEDICAL HISTORY:   Past Medical History  Diagnosis Date  . Hypertension   . High cholesterol   . Cancer   . Breast cancer   . Allergy     seasonal allergies  .  Arthritis   . Anxiety   . Breast mass, left 2014  . Breast mass, right 2014    ALLERGIES: Allergies  Allergen Reactions  . Codeine Hives      MEDICATIONS: I have reviewed the patient's current medications.     PAST SURGICAL HISTORY Past Surgical History  Procedure Laterality Date  . Cholecystectomy    . Tubal ligation    . Tonsillectomy    . Breast biopsy  10/02/2012    Procedure: BREAST BIOPSY;  Surgeon: Fabio Bering, MD;  Location: [facility];  Service: General;  Laterality: Right;  . Portacath placement Left 10/20/2012  . Breast biopsy Left 10/20/2012    Procedure: BREAST BIOPSY;  Surgeon: Fabio Bering, MD;  Location: [facility];  Service: General;  Laterality: Left;  Procedure end 1142  . Portacath placement Left 10/20/2012    Procedure: INSERTION PORT-A-CATH;  Surgeon: Fabio Bering, MD;  Location: [facility];  Service: General;  Laterality: Left;  Procedure began 1143; left subclavian    FAMILY HISTORY: Family History  Problem Relation Age of Onset  . Heart attack Father   . Cancer Maternal Aunt     colon cancer  . Stroke Maternal Uncle   . Cancer Maternal Grandmother     colon cancer  . Cancer Maternal Aunt     lung cancer  . COPD Daughter     SOCIAL HISTORY:  reports that she has never  smoked. She has never used smokeless tobacco. She reports that she drinks about 0.6 ounces of alcohol per week. She reports that she does not use illicit drugs.  PERFORMANCE STATUS: The patient's performance status is 2 - Symptomatic, <50% confined to bed  PHYSICAL EXAM: Most Recent Vital Signs: Blood pressure 116/71, pulse 104, temperature 98.9 F (37.2 C), temperature source Oral, resp. rate 20, height 5\' 7"  (1.702 m), weight 185 lb (83.915 kg), SpO2 95.00%. General appearance: alert, cooperative, no distress and moderately obese Head: Normocephalic, without obvious abnormality, atraumatic, alopecia Eyes: negative findings: conjunctivae and sclerae normal Lungs: clear to  auscultation bilaterally Heart: regular rate and rhythm, S1, S2 normal, no murmur, click, rub or gallop Abdomen: soft, non-tender; bowel sounds normal; no masses,  no organomegaly Extremities: extremities normal, atraumatic, no cyanosis or edema Skin: Skin color, texture, turgor normal. No rashes or lesions Neurologic: Grossly normal  LABORATORY DATA:  Results for orders placed during the hospital encounter of 12/31/12 (from the past 48 hour(s))  CBC WITH DIFFERENTIAL     Status: Abnormal   Collection Time    12/31/12  3:16 PM      Result Value Range   WBC 1.6 (*) 4.0 - 10.5 K/uL   RBC 2.48 (*) 3.87 - 5.11 MIL/uL   Hemoglobin 8.4 (*) 12.0 - 15.0 g/dL   HCT 91.4 (*) 78.2 - 95.6 %   MCV 97.6  78.0 - 100.0 fL   MCH 33.9  26.0 - 34.0 pg   MCHC 34.7  30.0 - 36.0 g/dL   RDW 21.3 (*) 08.6 - 57.8 %   Platelets 88 (*) 150 - 400 K/uL   Neutrophils Relative 76  43 - 77 %   Neutro Abs 1.2 (*) 1.7 - 7.7 K/uL   Lymphocytes Relative 19  12 - 46 %   Lymphs Abs 0.3 (*) 0.7 - 4.0 K/uL   Monocytes Relative 5  3 - 12 %   Monocytes Absolute 0.1  0.1 - 1.0 K/uL   Eosinophils Relative 0  0 - 5 %   Eosinophils Absolute 0.0  0.0 - 0.7 K/uL   Basophils Relative 1  0 - 1 %   Basophils Absolute 0.0  0.0 - 0.1 K/uL  COMPREHENSIVE METABOLIC PANEL     Status: Abnormal   Collection Time    12/31/12  3:16 PM      Result Value Range   Sodium 133 (*) 135 - 145 mEq/L   Potassium 4.3  3.5 - 5.1 mEq/L   Chloride 96  96 - 112 mEq/L   CO2 27  19 - 32 mEq/L   Glucose, Bld 112 (*) 70 - 99 mg/dL   BUN 13  6 - 23 mg/dL   Creatinine, Ser 4.69  0.50 - 1.10 mg/dL   Calcium 8.2 (*) 8.4 - 10.5 mg/dL   Total Protein 5.9 (*) 6.0 - 8.3 g/dL   Albumin 3.1 (*) 3.5 - 5.2 g/dL   AST 11  0 - 37 U/L   ALT 16  0 - 35 U/L   Alkaline Phosphatase 64  39 - 117 U/L   Total Bilirubin 0.9  0.3 - 1.2 mg/dL   GFR calc non Af Amer 65 (*) >90 mL/min   GFR calc Af Amer 76 (*) >90 mL/min   Comment:            The eGFR has been  calculated     using the CKD EPI equation.     This calculation has not  been     validated in all clinical     situations.     eGFR's persistently     <90 mL/min signify     possible Chronic Kidney Disease.  BASIC METABOLIC PANEL     Status: Abnormal   Collection Time    01/01/13  5:04 AM      Result Value Range   Sodium 133 (*) 135 - 145 mEq/L   Potassium 3.9  3.5 - 5.1 mEq/L   Chloride 96  96 - 112 mEq/L   CO2 26  19 - 32 mEq/L   Glucose, Bld 104 (*) 70 - 99 mg/dL   BUN 11  6 - 23 mg/dL   Creatinine, Ser 1.61  0.50 - 1.10 mg/dL   Calcium 8.1 (*) 8.4 - 10.5 mg/dL   GFR calc non Af Amer 88 (*) >90 mL/min   GFR calc Af Amer >90  >90 mL/min   Comment:            The eGFR has been calculated     using the CKD EPI equation.     This calculation has not been     validated in all clinical     situations.     eGFR's persistently     <90 mL/min signify     possible Chronic Kidney Disease.      RADIOGRAPHY: Dg Elbow Complete Right  12/31/2012  *RADIOLOGY REPORT*  Clinical Data: Right elbow swelling, pain.  RIGHT ELBOW - COMPLETE 3+ VIEW  Comparison: None.  Findings: Diffuse soft tissue swelling about the right elbow.  No underlying bony abnormality.  No fracture, subluxation or dislocation.  No joint effusion.  No radiopaque foreign bodies.  IMPRESSION: No acute bony abnormality.   Original Report Authenticated By: Charlett Nose, M.D.        PATHOLOGY:    10/02/2012  ADDITIONAL INFORMATION:  CHROMOGENIC IN-SITU HYBRIDIZATION  Interpretation:  HER2/NEU BY CISH - SHOWS AMPLIFICATION BY CISH ANALYSIS. THE RATIO OF HER2: CEP 17  SIGNALS WAS 2.89  Reference range:  Ratio: HER2:CEP17 < 1.8 gene amplification not observed  Ratio: HER2:CEP 17 1.8-2.2 - equivocal result  Ratio: HER2:CEP17 > 2.2 - gene amplification observed  Pecola Leisure MD  Pathologist, Electronic Signature  ( Signed 10/10/2012)  PROGNOSTIC INDICATORS - ACIS  Results  IMMUNOHISTOCHEMICAL AND MORPHOMETRIC ANALYSIS BY  THE AUTOMATED CELLULAR  IMAGING SYSTEM (ACIS)  Estrogen Receptor (Negative, <1%): 0% NEGATIVE  Progesterone Receptor (Negative, <1%): 0%, NEGATIVE  Proliferation Marker Ki67 by M IB-1 (Low<20%): 70%  COMMENT: The negative hormone receptor study(ies) in this case have no internal positive control.  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 PLEASE  SEE COMMENT FOR DETAILS.  Microscopic Comment  Received grossly are two fragments of adipose tissue measuring 2.8 and 2.5 cm. Microscopically, sections  show grade II invasive ductal carcinoma in a background of extensive necrosis. The inked, unoriented  margins are extensively involved by tumor. A breast prognostic profile will be performed and addendum report  will follow. An E-cadherin stain was performed and the stain confirmed the above interpretation. Dr. Leticia Penna  was paged on 10/04/2012. (HCL:eps 10/04/12)  Abigail Miyamoto MD  Pathologist, Electronic  Signature  (Case signed 10/05/2012)   ASSESSMENT: 1. Stage IIIC/Stage IV, Her2+, ER-, PR-, KI-67 70%, breast cancer. Starting systemic chemotherapy consisting of TCH on 10/23/2012. She is Day 8 Cycle 4 with Neulasta support on 12/26/2012. She has had a wonderful response to therapy thus far.  2. Cellulitis of right UE, on IV antibiotics, Vancomycin and Unasyn. 3. Neutropenia, secondary to chemotherapy. 4. Anemia, reactive +/- chemotherapy 5. Thrombocytopenia, reactive +/- chemotherapy.    PLAN:  1. I personally reviewed and went over laboratory results with the patient. 2. I personally reviewed and went over radiographic studies with the  patient. 3. Continue supportive care and antibiotics 4. Consult Dr. Leticia Penna (Gen Surgery) for consideration of bilateral mastectomies for locoregional control due to great response in aforementioned therapy.  5. Will consult radiation oncology as an outpatient.  6. Will continue to follow as an inpatient.  There is not a role for Neupogen as she received Neulasta on 12/26/2012.   All questions were answered. The patient knows to call the clinic with any problems, questions or concerns. We can certainly see the patient much sooner if necessary.  The patient and plan discussed with Glenford Peers, MD and he is in agreement with the aforementioned.  Ellanor Feuerstein

## 2013-01-01 NOTE — Progress Notes (Signed)
Seen and agree. See my progress note.  Brendia Sacks, MD [facility] [phone]

## 2013-01-02 DIAGNOSIS — M79609 Pain in unspecified limb: Secondary | ICD-10-CM

## 2013-01-02 DIAGNOSIS — E876 Hypokalemia: Secondary | ICD-10-CM | POA: Diagnosis not present

## 2013-01-02 LAB — CBC
Hemoglobin: 8.1 g/dL — ABNORMAL LOW (ref 12.0–15.0)
MCH: 33.6 pg (ref 26.0–34.0)
RBC: 2.41 MIL/uL — ABNORMAL LOW (ref 3.87–5.11)
WBC: 1.6 10*3/uL — ABNORMAL LOW (ref 4.0–10.5)

## 2013-01-02 LAB — VANCOMYCIN, TROUGH: Vancomycin Tr: 8.5 ug/mL — ABNORMAL LOW (ref 10.0–20.0)

## 2013-01-02 LAB — BASIC METABOLIC PANEL
CO2: 26 mEq/L (ref 19–32)
Chloride: 96 mEq/L (ref 96–112)
Glucose, Bld: 113 mg/dL — ABNORMAL HIGH (ref 70–99)
Potassium: 3.3 mEq/L — ABNORMAL LOW (ref 3.5–5.1)
Sodium: 135 mEq/L (ref 135–145)

## 2013-01-02 MED ORDER — PROMETHAZINE HCL 25 MG/ML IJ SOLN
25.0000 mg | Freq: Three times a day (TID) | INTRAMUSCULAR | Status: DC | PRN
  Administered 2013-01-02: 25 mg via INTRAVENOUS
  Filled 2013-01-02: qty 1

## 2013-01-02 MED ORDER — PROMETHAZINE HCL 25 MG/ML IJ SOLN
12.5000 mg | Freq: Once | INTRAMUSCULAR | Status: AC
  Administered 2013-01-02: 12.5 mg via INTRAVENOUS
  Filled 2013-01-02: qty 1

## 2013-01-02 MED ORDER — PROMETHAZINE HCL 12.5 MG PO TABS: 25.0000 mg | ORAL_TABLET | Freq: Three times a day (TID) | ORAL | Status: DC | PRN

## 2013-01-02 MED ORDER — PROCHLORPERAZINE EDISYLATE 5 MG/ML IJ SOLN
10.0000 mg | Freq: Four times a day (QID) | INTRAMUSCULAR | Status: DC | PRN
  Filled 2013-01-02 (×2): qty 2

## 2013-01-02 MED ORDER — PROMETHAZINE HCL 25 MG RE SUPP: 25.0000 mg | Freq: Three times a day (TID) | RECTAL | Status: DC | PRN

## 2013-01-02 MED ORDER — VANCOMYCIN HCL 10 G IV SOLR
1250.0000 mg | Freq: Two times a day (BID) | INTRAVENOUS | Status: DC
  Administered 2013-01-02 – 2013-01-04 (×4): 1250 mg via INTRAVENOUS
  Filled 2013-01-02 (×4): qty 1250

## 2013-01-02 MED ORDER — POTASSIUM CHLORIDE CRYS ER 20 MEQ PO TBCR
40.0000 meq | EXTENDED_RELEASE_TABLET | Freq: Once | ORAL | Status: AC
  Administered 2013-01-02: 40 meq via ORAL
  Filled 2013-01-02 (×2): qty 2

## 2013-01-02 NOTE — Care Management Note (Signed)
    Page 1 of 1   01/04/2013     3:17:12 PM   CARE MANAGEMENT NOTE 01/04/2013  Patient:  Erika Nunez, Erika Nunez   Account Number:  [id number]  Date Initiated:  01/02/2013  Documentation initiated by:  Rosemary Holms  Subjective/Objective Assessment:   Pt admitted from home where she lives with her Grandson. He is 63 yo and of great assistance per pt. Pt is aware that she may need to go home on IV antibiotics and states her daughter will be able to assist with this. Pt previously had [facility]     Action/Plan:   and have asked for [facility] to assist in [facility] RN.   Anticipated DC Date:  01/04/2013   Anticipated DC Plan:  HOME W HOME HEALTH SERVICES      DC Planning Services  CM consult      Choice offered to / List presented to:             Status of service:  Completed, signed off Medicare Important Message given?  YES (If response is "NO", the following Medicare IM given date fields will be blank) Date Medicare IM given:  01/04/2013 Date Additional Medicare IM given:    Discharge Disposition:  HOME/SELF CARE  Per UR Regulation:    If discussed at Long Length of Stay Meetings, dates discussed:    Comments:  01/02/13 Rosemary Holms RN BSN CM

## 2013-01-02 NOTE — Progress Notes (Signed)
Patient seen, independently examined and chart reviewed. I agree with exam, assessment and plan discussed with Erika Smothers, NP.  Subjective: Feels somewhat better. Some nausea but able to eat. Right arm was painful, easier to move. Not able to use.  Objective: Appears calm, generally better. Remains afebrile, vital signs stable. The right arm is making moderate improvement with decreasing erythema, regression from marked borders and modest decrease in edema. Grip strength improved, less pain. Patient able to manipulate arm with far less pain. Elbow flexion and extension appears normal and there is no tenderness over the joint. There is an area of induration superior to the olecranon without fluctuance. Small pustule seen. No exudate.  Labs: Potassium 3.3. Pancytopenia persists.  Acute issues:  Cellulitis right upper extremity: Slowly improving.   Pancytopenia: Deferred to oncology.  Plan:  Continue IV antibiotics.  Anticipate discharge 48 hours. Whether on IV or oral antibiotics remains to be seen.   Erika Sacks, MD [facility] [phone]

## 2013-01-02 NOTE — Progress Notes (Signed)
[facility] PROGRESS NOTE  Erika Nunez JXB:[id number] DOB: [birth year] DOA: 12/31/2012 PCP: Randall An, MD  Assessment/Plan: 1. Cellulitis of right upper extremity: much improved this am. Clearly less erythema and swelling. Improved ROM, less pain. Will continue Vanc and unasyn day #3. Remains afebrile and non-toxic appearing.  Active Problems:   2. Breast cancer: appreciate oncology assistance. Surgical consult per oncology. Last chemo 12/26/12.    3. Hypertension: controlled. SBP range 116-127. Continue lisinopril and hydrochlorothiazide   4. Hypokalemia: mild. Likely due to decreased po intake and meds. Will replete and recheck.   Code Status: full Family Communication: none avaialable Disposition Plan: home when ready hopefully tomorrow   Consultants:  none  Procedures:  none  Antibiotics: Unasyn 12/31/12>>>  Vancomycin 12/31/12>>>    HPI/Subjective: Sitting up in bed eating breakfast. Smiling reports feeling better. Less pain in right arm.   Objective: Filed Vitals:   01/01/13 0456 01/01/13 1404 01/01/13 2040 01/02/13 0419  BP: 116/71 127/64 116/83 125/54  Pulse: 104 94 85 89  Temp: 98.9 F (37.2 C) 98.1 F (36.7 C) 98 F (36.7 C) 98.1 F (36.7 C)  TempSrc: Oral Oral Oral Oral  Resp: 20 20 17 18   Height:      Weight:      SpO2: 95% 98% 96% 99%    Intake/Output Summary (Last 24 hours) at 01/02/13 0850 Last data filed at 01/02/13 0643  Gross per 24 hour  Intake   1400 ml  Output      0 ml  Net   1400 ml   Filed Weights   12/31/12 1408  Weight: 83.915 kg (185 lb)    Exam:   General:  Well nourished, smiling, NAD  Cardiovascular: RRR No MGR No LE edema PPP  Respiratory: normal effort BS clear bilaterally with no wheeze, no rhonchi  Abdomen: round soft +Bs non-tender to palpation  Musculoskeletal: right arm with less erythema particularly in shoulder area. Less swelling and increased ROM. Right hand much less swollen. Lateral  aspect of right elbow remains warm with erythema and very small pustule. No drainage.    Data Reviewed: Basic Metabolic Panel:  Recent Labs Lab 12/31/12 1516 01/01/13 0504 01/02/13 0459  NA 133* 133* 135  K 4.3 3.9 3.3*  CL 96 96 96  CO2 27 26 26   GLUCOSE 112* 104* 113*  BUN 13 11 11   CREATININE 0.92 0.77 0.74  CALCIUM 8.2* 8.1* 8.1*   Liver Function Tests:  Recent Labs Lab 12/31/12 1516  AST 11  ALT 16  ALKPHOS 64  BILITOT 0.9  PROT 5.9*  ALBUMIN 3.1*   No results found for this basename: LIPASE, AMYLASE,  in the last 168 hours No results found for this basename: AMMONIA,  in the last 168 hours CBC:  Recent Labs Lab 12/31/12 1516 01/02/13 0459  WBC 1.6* 1.6*  NEUTROABS 1.2*  --   HGB 8.4* 8.1*  HCT 24.2* 23.2*  MCV 97.6 96.3  PLT 88* 70*   Cardiac Enzymes: No results found for this basename: CKTOTAL, CKMB, CKMBINDEX, TROPONINI,  in the last 168 hours BNP (last 3 results) No results found for this basename: PROBNP,  in the last 8760 hours CBG: No results found for this basename: GLUCAP,  in the last 168 hours  No results found for this or any previous visit (from the past 240 hour(s)).   Studies: Dg Elbow Complete Right  12/31/2012  *RADIOLOGY REPORT*  Clinical Data: Right elbow swelling, pain.  RIGHT ELBOW - COMPLETE  3+ VIEW  Comparison: None.  Findings: Diffuse soft tissue swelling about the right elbow.  No underlying bony abnormality.  No fracture, subluxation or dislocation.  No joint effusion.  No radiopaque foreign bodies.  IMPRESSION: No acute bony abnormality.   Original Report Authenticated By: Charlett Nose, M.D.     Scheduled Meds: . ampicillin-sulbactam (UNASYN) IV  3 g Intravenous Q6H  . enoxaparin (LOVENOX) injection  40 mg Subcutaneous Q24H  . lisinopril  20 mg Oral Daily   And  . hydrochlorothiazide  25 mg Oral Daily  . potassium chloride SA  20 mEq Oral TID  . sertraline  150 mg Oral Daily  . simvastatin  40 mg Oral q1800  . sodium  chloride  3 mL Intravenous Q12H  . vancomycin  1,000 mg Intravenous Q12H   Continuous Infusions:    Time spent: 30 minutes    [facility] M  [facility]  If 7PM-7AM, please contact night-coverage at [email or web address], password [facility] 01/02/2013, 8:50 AM  LOS: 2 days

## 2013-01-02 NOTE — Progress Notes (Signed)
ANTIBIOTIC CONSULT NOTE   Pharmacy Consult for Vancomycin & Unasyn  Indication: cellulitis RUE  Allergies  Allergen Reactions  . Codeine Hives   Patient Measurements: Height: 5\' 7"  (170.2 cm) Weight: 185 lb (83.915 kg) IBW/kg (Calculated) : 61.6  Vital Signs: Temp: 98.1 F (36.7 C) (04/22 0419) Temp src: Oral (04/22 0419) BP: 125/54 mmHg (04/22 0419) Pulse Rate: 89 (04/22 0419) Intake/Output from previous day: 04/21 0701 - 04/22 0700 In: 1400 [IV Piggyback:1400] Out: -  Intake/Output from this shift:    Labs:  Recent Labs  12/31/12 1516 01/01/13 0504 01/02/13 0459  WBC 1.6*  --  1.6*  HGB 8.4*  --  8.1*  PLT 88*  --  70*  CREATININE 0.92 0.77 0.74   Estimated Creatinine Clearance: 81.1 ml/min (by C-G formula based on Cr of 0.74).  Recent Labs  01/02/13 0459  VANCOTROUGH 8.5*    Microbiology: No results found for this or any previous visit (from the past 720 hour(s)).  Medical History: Past Medical History  Diagnosis Date  . Hypertension   . High cholesterol   . Cancer   . Breast cancer   . Allergy     seasonal allergies  . Arthritis   . Anxiety   . Breast mass, left 2014  . Breast mass, right 2014   Medications:  Scheduled:  . ampicillin-sulbactam (UNASYN) IV  3 g Intravenous Q6H  . enoxaparin (LOVENOX) injection  40 mg Subcutaneous Q24H  . lisinopril  20 mg Oral Daily   And  . hydrochlorothiazide  25 mg Oral Daily  . potassium chloride SA  20 mEq Oral TID  . potassium chloride  40 mEq Oral Once  . sertraline  150 mg Oral Daily  . simvastatin  40 mg Oral q1800  . sodium chloride  3 mL Intravenous Q12H  . vancomycin  1,250 mg Intravenous Q12H  . [DISCONTINUED] lisinopril  20 mg Oral Daily  . [DISCONTINUED] vancomycin  1,000 mg Intravenous Q12H   Assessment: 62yo female admitted with RUE cellulitis.  Pt has good renal fxn.  Estimated Creatinine Clearance: 81.1 ml/min (by C-G formula based on Cr of 0.74).    Trough level is below  goal.  ANTIBIOTICS: Unasyn 4/20 >>> Vancomycin 4/20 >>>  Goal of Therapy:  Vancomycin trough level 10-15 mcg/ml  Plan:  Increase Vancomycin to 1250mg  IV every 12 hours Continue Unasyn 3 GM IV every 6 hours Vancomycin trough level weekly Monitor renal function Labs per protocol  Valrie Hart A 01/02/2013,9:43 AM

## 2013-01-03 DIAGNOSIS — R112 Nausea with vomiting, unspecified: Secondary | ICD-10-CM | POA: Diagnosis present

## 2013-01-03 DIAGNOSIS — E876 Hypokalemia: Secondary | ICD-10-CM

## 2013-01-03 LAB — CBC
MCV: 97.1 fL (ref 78.0–100.0)
Platelets: 67 10*3/uL — ABNORMAL LOW (ref 150–400)
RBC: 2.42 MIL/uL — ABNORMAL LOW (ref 3.87–5.11)
RDW: 19 % — ABNORMAL HIGH (ref 11.5–15.5)
WBC: 3.5 10*3/uL — ABNORMAL LOW (ref 4.0–10.5)

## 2013-01-03 LAB — BASIC METABOLIC PANEL
CO2: 27 mEq/L (ref 19–32)
Calcium: 8.4 mg/dL (ref 8.4–10.5)
Chloride: 97 mEq/L (ref 96–112)
Creatinine, Ser: 0.77 mg/dL (ref 0.50–1.10)
GFR calc Af Amer: >90 mL/min (ref >90)
Sodium: 138 mEq/L (ref 135–145)

## 2013-01-03 MED ORDER — POTASSIUM CHLORIDE 10 MEQ/100ML IV SOLN
INTRAVENOUS | Status: AC
  Administered 2013-01-03: 14:00:00
  Filled 2013-01-03: qty 100

## 2013-01-03 MED ORDER — POTASSIUM CHLORIDE 10 MEQ/100ML IV SOLN
10.0000 meq | INTRAVENOUS | Status: AC
Start: 2013-01-03 — End: 2013-01-03
  Administered 2013-01-03 (×3): 10 meq via INTRAVENOUS
  Filled 2013-01-03 (×2): qty 100

## 2013-01-03 MED ORDER — LORAZEPAM 2 MG/ML IJ SOLN
1.0000 mg | Freq: Once | INTRAMUSCULAR | Status: AC
  Administered 2013-01-03: 1 mg via INTRAVENOUS
  Filled 2013-01-03: qty 1

## 2013-01-03 NOTE — Progress Notes (Signed)
[facility] PROGRESS NOTE  Loveah Like Negash ZOX:[id number] DOB: [birth year] DOA: 12/31/2012 PCP: Randall An, MD  Assessment/Plan: 1. Cellulitis of right upper extremity:  Continues to improve.. Clearly less erythema and swelling. Improved ROM, less pain. Will continue Vanc and unasyn day #4. Remains afebrile and non-toxic appearing. Anticipate ready for discharge tomorrow. Depending on degree of improvement, may not need IV antibiotics at discharge.  Active Problems:  2. Nausea/vomiting: somewhat chronic given she is on chemo. Last chemo 12/26/12. Yesterday initially tolerating diet. Several vomiting episodes in afternoon and during night. Diet changed to clears. Uses ativan and compazine at home to manage. Ativan and compazine resumed. Somewhat better this am. Will advance diet to full liquids  3. Hypokalemia: likely related to yesterday's vomiting. Will replete and recheck.    4. Breast cancer: appreciate oncology assistance. Surgical consult per oncology. Last chemo 12/26/12. White count trending up. Dr. Mariel Sleet following.   5. Hypertension: controlled. SBP range 130-142. Continue lisinopril and hydrochlorothiazide    Code Status: full Family Communication: family at bedside Disposition Plan: home hopefully tomorrow   Consultants:  Oncology  Procedures:  none  Antibiotics: Unasyn 12/31/12>>>  Vancomycin 12/31/12>>>   HPI/Subjective: Sitting up in bed. Reports feeling "alittle better but still nauseated" No vomiting since ativan at 5am.   Objective: Filed Vitals:   01/02/13 0419 01/02/13 1400 01/02/13 2314 01/03/13 0507  BP: 125/54 142/83 130/81 133/83  Pulse: 89 75 79 68  Temp: 98.1 F (36.7 C) 98.3 F (36.8 C) 97.8 F (36.6 C) 97.9 F (36.6 C)  TempSrc: Oral Oral    Resp: 18 18 18 20   Height:      Weight:    84.777 kg (186 lb 14.4 oz)  SpO2: 99% 97% 96% 98%    Intake/Output Summary (Last 24 hours) at 01/03/13 1215 Last data filed at 01/02/13 1849  Gross per 24 hour  Intake    450 ml  Output      0 ml  Net    450 ml   Filed Weights   12/31/12 1408 01/03/13 0507  Weight: 83.915 kg (185 lb) 84.777 kg (186 lb 14.4 oz)    Exam:   General:  Awake ill appearing NAD  Cardiovascular: RRR No MGR No LE edema  Respiratory: normal effort BS clear bilaterally no rhonchi  Abdomen: obese soft +BS non-tender to palpation  Musculoskeletal: right arm with decreasing erythema from marked borders. Much less edema and increased rom. Area of induration on lateral aspect of right elbow less warm and less red. No drainage. No fluctuance.   Data Reviewed: Basic Metabolic Panel:  Recent Labs Lab 12/31/12 1516 01/01/13 0504 01/02/13 0459 01/03/13 0535  NA 133* 133* 135 138  K 4.3 3.9 3.3* 2.9*  CL 96 96 96 97  CO2 27 26 26 27   GLUCOSE 112* 104* 113* 141*  BUN 13 11 11 15   CREATININE 0.92 0.77 0.74 0.77  CALCIUM 8.2* 8.1* 8.1* 8.4   Liver Function Tests:  Recent Labs Lab 12/31/12 1516  AST 11  ALT 16  ALKPHOS 64  BILITOT 0.9  PROT 5.9*  ALBUMIN 3.1*   No results found for this basename: LIPASE, AMYLASE,  in the last 168 hours No results found for this basename: AMMONIA,  in the last 168 hours CBC:  Recent Labs Lab 12/31/12 1516 01/02/13 0459 01/03/13 0535  WBC 1.6* 1.6* 3.5*  NEUTROABS 1.2*  --   --   HGB 8.4* 8.1* 8.3*  HCT 24.2* 23.2* 23.5*  MCV  97.6 96.3 97.1  PLT 88* 70* 67*   Cardiac Enzymes: No results found for this basename: CKTOTAL, CKMB, CKMBINDEX, TROPONINI,  in the last 168 hours BNP (last 3 results) No results found for this basename: PROBNP,  in the last 8760 hours CBG: No results found for this basename: GLUCAP,  in the last 168 hours  No results found for this or any previous visit (from the past 240 hour(s)).   Studies: No results found.  Scheduled Meds: . ampicillin-sulbactam (UNASYN) IV  3 g Intravenous Q6H  . enoxaparin (LOVENOX) injection  40 mg Subcutaneous Q24H  . lisinopril  20 mg  Oral Daily   And  . hydrochlorothiazide  25 mg Oral Daily  . potassium chloride SA  20 mEq Oral TID  . sertraline  150 mg Oral Daily  . simvastatin  40 mg Oral q1800  . sodium chloride  3 mL Intravenous Q12H  . vancomycin  1,250 mg Intravenous Q12H   Continuous Infusions:   Principal Problem:   Cellulitis of right upper extremity Active Problems:   Breast cancer   Hypertension   Hypokalemia    Time spent: 30 minutes    [facility] M  [facility]  If 7PM-7AM, please contact night-coverage at [email or web address], password [facility] 01/03/2013, 12:15 PM  LOS: 3 days        Attending  The patient was seen and examined. She was discussed with NP, Ms. Vedia Coffer. Agree with findings.

## 2013-01-03 NOTE — Progress Notes (Signed)
Subjective: Patient seen in bed.  Reports some nausea that is well controlled with lorazepam.  She reports that her right arm is less painful and improving  I personally reviewed and went over laboratory results with the patient.  May D/C neutropenic precautions.  Objective: Vital signs in last 24 hours: Temp:  [97.8 F (36.6 C)-98.3 F (36.8 C)] 97.9 F (36.6 C) (04/23 0507) Pulse Rate:  [68-79] 68 (04/23 0507) Resp:  [18-20] 20 (04/23 0507) BP: (130-142)/(81-83) 133/83 mmHg (04/23 0507) SpO2:  [96 %-98 %] 98 % (04/23 0507) Weight:  [186 lb 14.4 oz (84.777 kg)] 186 lb 14.4 oz (84.777 kg) (04/23 0507)  Intake/Output from previous day: 04/22 0800 - 04/23 0759 In: 550 [I.V.:100; IV Piggyback:450] Out: -  Intake/Output this shift:    General appearance: alert, cooperative, appears stated age, no distress and alopecia  Lab Results:   Recent Labs  01/02/13 0459 01/03/13 0535  WBC 1.6* 3.5*  HGB 8.1* 8.3*  HCT 23.2* 23.5*  PLT 70* 67*   BMET  Recent Labs  01/02/13 0459 01/03/13 0535  NA 135 138  K 3.3* 2.9*  CL 96 97  CO2 26 27  GLUCOSE 113* 141*  BUN 11 15  CREATININE 0.74 0.77  CALCIUM 8.1* 8.4    Studies/Results: No results found.  Medications: I have reviewed the patient's current medications.  Assessment/Plan: 1. Stage IIIC/Stage IV, Her2+, ER-, PR-, KI-67 70%, breast cancer. Starting systemic chemotherapy consisting of TCH on 10/23/2012. She is Day 10 Cycle 4 with Neulasta support on 12/26/2012. She has had a wonderful response to therapy thus far.  2. Cellulitis of right UE, on IV antibiotics, Vancomycin and Unasyn. Improving. 3. Neutropenia, secondary to chemotherapy. Resolving.  Recommend D/C Neutropenic Precautions. 4. Anemia, reactive +/- chemotherapy.  Improving.  If not close to 9 g/dL tomorrow, will recommend PRBC transfusion tomorrow.  5. Thrombocytopenia, reactive +/- chemotherapy. Stable    LOS: 3 days    KEFALAS,THOMAS 01/03/2013

## 2013-01-03 NOTE — Progress Notes (Signed)
UR chart review completed.  

## 2013-01-04 ENCOUNTER — Encounter (HOSPITAL_COMMUNITY): Payer: Self-pay | Admitting: Internal Medicine

## 2013-01-04 ENCOUNTER — Other Ambulatory Visit (HOSPITAL_COMMUNITY): Payer: Self-pay | Admitting: Oncology

## 2013-01-04 DIAGNOSIS — D649 Anemia, unspecified: Secondary | ICD-10-CM | POA: Diagnosis present

## 2013-01-04 DIAGNOSIS — D709 Neutropenia, unspecified: Secondary | ICD-10-CM | POA: Diagnosis present

## 2013-01-04 DIAGNOSIS — D61810 Antineoplastic chemotherapy induced pancytopenia: Secondary | ICD-10-CM

## 2013-01-04 DIAGNOSIS — D696 Thrombocytopenia, unspecified: Secondary | ICD-10-CM | POA: Diagnosis present

## 2013-01-04 DIAGNOSIS — C50911 Malignant neoplasm of unspecified site of right female breast: Secondary | ICD-10-CM

## 2013-01-04 DIAGNOSIS — L03113 Cellulitis of right upper limb: Secondary | ICD-10-CM

## 2013-01-04 HISTORY — DX: Adverse effect of antineoplastic and immunosuppressive drugs, initial encounter: D61.810

## 2013-01-04 LAB — BASIC METABOLIC PANEL
CO2: 29 mEq/L (ref 19–32)
Chloride: 99 mEq/L (ref 96–112)
GFR calc Af Amer: >90 mL/min (ref >90)
Potassium: 3.1 mEq/L — ABNORMAL LOW (ref 3.5–5.1)

## 2013-01-04 LAB — CBC
HCT: 23.2 % — ABNORMAL LOW (ref 36.0–46.0)
MCV: 98.3 fL (ref 78.0–100.0)
Platelets: 59 10*3/uL — ABNORMAL LOW (ref 150–400)
RBC: 2.36 MIL/uL — ABNORMAL LOW (ref 3.87–5.11)
RDW: 19 % — ABNORMAL HIGH (ref 11.5–15.5)
WBC: 3.9 10*3/uL — ABNORMAL LOW (ref 4.0–10.5)

## 2013-01-04 MED ORDER — HEPARIN SOD (PORK) LOCK FLUSH 100 UNIT/ML IV SOLN
500.0000 [IU] | INTRAVENOUS | Status: DC | PRN
  Filled 2013-01-04: qty 5

## 2013-01-04 MED ORDER — AMOXICILLIN-POT CLAVULANATE 500-125 MG PO TABS: 1.0000 | ORAL_TABLET | Freq: Two times a day (BID) | ORAL | Status: DC

## 2013-01-04 MED ORDER — POTASSIUM CHLORIDE CRYS ER 20 MEQ PO TBCR
30.0000 meq | EXTENDED_RELEASE_TABLET | Freq: Three times a day (TID) | ORAL | Status: DC
  Administered 2013-01-04: 30 meq via ORAL
  Filled 2013-01-04: qty 1

## 2013-01-04 MED ORDER — POTASSIUM CHLORIDE CRYS ER 15 MEQ PO TBCR: EXTENDED_RELEASE_TABLET | ORAL | Status: DC

## 2013-01-04 MED ORDER — POLYETHYLENE GLYCOL 3350 17 G PO PACK: 17.0000 g | PACK | Freq: Every day | ORAL | Status: DC | PRN

## 2013-01-04 MED ORDER — AMOXICILLIN-POT CLAVULANATE 500-125 MG PO TABS
1.0000 | ORAL_TABLET | Freq: Two times a day (BID) | ORAL | Status: DC
  Administered 2013-01-04: 500 mg via ORAL
  Filled 2013-01-04: qty 1

## 2013-01-04 NOTE — Discharge Summary (Signed)
Physician Discharge Summary  Erika Nunez AOZ:[id number] DOB: 10/16/1950 DOA: 12/31/2012  PCP: Randall An, MD  Admit date: 12/31/2012 Discharge date: 01/04/2013  Time spent: 40 minutes  Recommendations for Outpatient Follow-up:  1. Follow up with oncology clinic next week for lab work to evaluate Hgb and platelet count. Will also need BMET to track potassium. 2. Chemotherapy deferred for 7 days per oncology. New chemotherapy schedule will be provided to her next week following the results of lab work.   Discharge Diagnoses:  Principal Problem:   Cellulitis of right upper extremity Active Problems:   Breast cancer   Hypertension   Hypokalemia   Nausea with vomiting   Neutropenia   Anemia   Thrombocytopenia, unspecified   Antineoplastic chemotherapy induced pancytopenia   Discharge Condition: stable  Diet recommendation: bland  Filed Weights   12/31/12 1408 01/03/13 0507  Weight: 83.915 kg (185 lb) 84.777 kg (186 lb 14.4 oz)    History of present illness:  62 year old woman who presented on 01/02/13 with increasing right upper extremity edema, pain and erythema. Initial evaluation was notable for right upper extremity cellulitis with massive edema. History obtained from chart review, patient, and family at the bedside. Since her diagnosis of breast cancer she has had intermittent edema of her right upper extremity which has waxed and waned. She was seen in the oncology clinic on 12/25/2012 for the same. The extensive workup was unremarkable including an ultrasound that was negative for DVT. On 12/28/2012, she developed increasing edema. Erythema developed over the previous 24-48 hours. The pain increased around the elbow and mid arm. The pain was intense and limited her movements.  In the emergency department, she was noted to be afebrile and tachycardic, but her blood pressure was within normal limits. Complete metabolic panel was unremarkable. CBC was notable for a white  blood cell count  of 1.6, hemoglobin of 8.4, and platelet count of 88. Right elbow film was negative.      Hospital Course:  1. Cellulitis of right upper extremity: Vancomycin and Unasyn were started and provided throughout the hospitalization which totaled 4 days. Supportive treatment was given. She was encouraged to keep her right arm elevated. Over time, the extent of edema, erythema, and pain subsided. She remained afebrile.  She was discharged on Augmentin 500mg  BID for 7 more days   2. Breast cancer: Oncology was consulted and assisted with care.  Her last chemotherapy was 12/26/12. Per oncology, per the chemotherapy will be deferred until her counts improved.     3. Hypertension: controlled. SBP range was 128-144. Will continue lisinopril and hydrochlorothiazide at discharge  4. Hypokalemia: mild and persistent. At the time of discharge, potassium supplement was increased to BID for 2 weeks. A basic metabolic panel was scheduled with oncology. Adjustments in potassium chloride management will be deferred to the oncology team.    5. Neutropenia: secondary to chemotherapy. Slowly improved during the hospitalization. Neutropenic precautions were instituted and then subsequently discontinued on 01/03/13 as her blood counts improved overall. She will followup with oncology next week for monitoring.   6. Thrombocytopenia: Secondary to chemotherapy. As above in #5.   7. Anemia: Secondary to chemotherapy. As above #5.   Procedures:  none  Consultations:  Dr. Mariel Sleet  Discharge Exam: Filed Vitals:   01/03/13 0507 01/03/13 1545 01/03/13 2205 01/04/13 0527  BP: 133/83 128/77 143/65 144/77  Pulse: 68 70 73 78  Temp: 97.9 F (36.6 C) 98.2 F (36.8 C) 98.3 F (36.8 C) 98.1  F (36.7 C)  TempSrc:  Oral  Oral  Resp: 20 18 18 20   Height:      Weight: 84.777 kg (186 lb 14.4 oz)     SpO2: 98% 98% 100% 99%    General: Alert, sitting up in bed, in no acute  distress. Cardiovascular: S1, S2, with a soft systolic murmur. Respiratory: normal effort BS clear no wheeze no rhonchi Musculoskeletal/skin: Right arm with mild erythema continuing to decrease from marked borders. Only mild edema. Full ROM without pain. Elbow less red.   Discharge Instructions      Discharge Orders   Future Appointments Provider Department Dept Phone   01/11/2013 9:30 AM [facility] [phone]   01/12/2013 9:30 AM [facility] [place] [facility]PUL[facility] [phone]   01/16/2013 12:00 PM [facility] Chair 7 [facility] [phone]   01/22/2013 9:15 AM [facility] Team B [facility] CAN[facility] CENTER [phone]   01/23/2013 2:30 PM [facility] Chair 7 [facility] CAN[facility]ENTER [phone]   01/24/2013 1:00 PM Randall An, MD [facility] [phone]   Future Orders Complete By Expires     Diet - low sodium heart healthy  As directed     Discharge instructions  As directed     Comments:      Complete medications as instructed.    Increase activity slowly  As directed         Medication List    TAKE these medications       amoxicillin-clavulanate 500-125 MG per tablet  Commonly known as:  AUGMENTIN  Take 1 tablet (500 mg total) by mouth 2 (two) times daily.     CARBOPLATIN IV  Inject into the vein every 21 ( twenty-one) days.     dexamethasone 4 MG tablet  Commonly known as:  DECADRON  Take 8 mg by mouth. The day before, day of, and day after chemo take 2 tablets in the am and 2 tablets in the pm. Then stop. Repeat this with each chemo cycle. Refer to your calendar.  (this medication is being given to reduce the risk of fluid retention from occurring due to the Taxotere chemo you are receiving).     HERCEPTIN IV  Inject into the vein every 21 ( twenty-one) days.     HYDROcodone-acetaminophen 5-325 MG per tablet  Commonly known as:  NORCO/VICODIN  Take 1-2 tablets by mouth every 4 (four) hours as  needed for pain.     ibuprofen 200 MG tablet  Commonly known as:  ADVIL,MOTRIN  Take 200 mg by mouth every 6 (six) hours as needed. Pain.     lidocaine-prilocaine cream  Commonly known as:  EMLA  Apply topically once. Apply a quarter sized amount to port site 1 hour prior to chemo. Do not rub in. Cover with plastic.     lisinopril 20 MG tablet  Commonly known as:  PRINIVIL,ZESTRIL  Take 20 mg by mouth daily.     lisinopril-hydrochlorothiazide 20-25 MG per tablet  Commonly known as:  PRINZIDE,ZESTORETIC  Take 1 tablet by mouth daily.     LORazepam 1 MG tablet  Commonly known as:  ATIVAN  Take 1 tablet (1 mg total) by mouth every 4 (four) hours as needed. For nausea/vomiting. Do not drive while taking this medication. This medication may make you drowsy or sleepy. Change positions slowly.     NEULASTA 6 MG/0.6ML injection  Generic drug:  pegfilgrastim  Inject 6 mg into the skin every  21 ( twenty-one) days. To be given 20-24 hours after the completion of chemo @@ the Cancer Center. Potential Side Effect is bone pain.     ondansetron 8 MG tablet  Commonly known as:  ZOFRAN  Take 8 mg by mouth. Starting the day after chemo, take 1 tablet in the am and 1 tablet in the pm for 2 days. Then may take 1 tablet two times a day IF needed for nausea/vomiting.     polyethylene glycol packet  Commonly known as:  MIRALAX / GLYCOLAX  Take 17 g by mouth daily as needed.     potassium chloride SA 15 MEQ tablet  Commonly known as:  KLOR-CON M15  Take 2 tablets twice daily for 2 weeks.     pravastatin 80 MG tablet  Commonly known as:  PRAVACHOL  Take 80 mg by mouth at bedtime.     prochlorperazine 10 MG tablet  Commonly known as:  COMPAZINE  Take 1 tablet (10 mg total) by mouth every 6 (six) hours as needed.     prochlorperazine 25 MG suppository  Commonly known as:  COMPAZINE  Place 1 suppository (25 mg total) rectally every 6 (six) hours as needed for nausea.     sertraline 100 MG  tablet  Commonly known as:  ZOLOFT  Take 150 mg by mouth daily.     TAXOTERE IV  Inject into the vein every 21 ( twenty-one) days.       Follow-up Information   Follow up with Randall An, MD On 01/11/2013. (Lab work at 9:30 am)    Contact information:   [address]  [place] [address] [phone]       Follow up with KEFALAS,THOMAS, PA-C. (2-3 weeks follow-up . The office will call with appointment.)    Contact information:   [address] [place] [address] [phone]        The results of significant diagnostics from this hospitalization (including imaging, microbiology, ancillary and laboratory) are listed below for reference.    Significant Diagnostic Studies: Dg Elbow Complete Right  12/31/2012  *RADIOLOGY REPORT*  Clinical Data: Right elbow swelling, pain.  RIGHT ELBOW - COMPLETE 3+ VIEW  Comparison: None.  Findings: Diffuse soft tissue swelling about the right elbow.  No underlying bony abnormality.  No fracture, subluxation or dislocation.  No joint effusion.  No radiopaque foreign bodies.  IMPRESSION: No acute bony abnormality.   Original Report Authenticated By: Charlett Nose, M.D.    Ct Chest W Contrast  12/26/2012  *RADIOLOGY REPORT*  Clinical Data: Status post chemotherapy for bilateral breast cancer.  Bilateral leg edema and right arm swelling for the past 4 days.  Hypertension.  CT CHEST WITH CONTRAST  Technique:  Multidetector CT imaging of the chest was performed following the standard protocol during bolus administration of intravenous contrast.  Contrast: 80mL OMNIPAQUE IOHEXOL 300 MG/ML  SOLN  Comparison: Portable chest dated 10/20/2012, chest CT dated 10/02/2012 and PET CT dated 10/12/2012.  Findings: Marked interval decrease in size of the previously demonstrated multiple large right breast masses with extension through the skin.  There is residual less mass-like density in those areas.  The largest area measures 6.9 x 3.1 cm on image number 23.   The previously demonstrated multiple enlarged right axillary lymph nodes are no longer enlarged.  The previously seen central left breast mass is no longer demonstrated, with interval postsurgical changes in that area, including soft tissue air.  Left subclavian porta catheter with its tip  in the proximal superior vena cava.  No lung masses.  No enlarged lymph nodes in the chest or upper abdomen.  Diffuse low density of the liver relative to the spleen is stable. Cholecystectomy clips.  Normal appearing adrenal glands.  Extensive thoracic spine degenerative changes.  No visible bone metastases.  Small amount of linear scarring in the lingula.  IMPRESSION:  1.  Significant interval decrease in size of the previously demonstrated right breast masses with residual less mass-like density in those regions. 2.  Status post surgical excision of the previously seen left breast mass. 3.  Resolved right axillary metastatic adenopathy. 4.  Stable hepatic steatosis.   Original Report Authenticated By: Beckie Salts, M.D.    US Venous Img Lower Bilateral  12/25/2012  *RADIOLOGY REPORT*  Clinical Data: Pitting edema for 3 days, history of breast cancer post right mastectomy  VENOUS DUPLEX ULTRASOUND OF BILATERAL LOWER EXTREMITIES  Technique:  Gray-scale sonography with graded compression, as well as color Doppler and duplex ultrasound, were performed to evaluate the deep venous system of both lower extremities from the level of the common femoral vein through the popliteal and proximal calf veins.  Spectral Doppler was utilized to evaluate flow at rest and with distal augmentation maneuvers.  Comparison:  None  Findings: Deep venous systems appear patent and compressible from groin through popliteal fossa bilaterally.  Spontaneous venous flow present with intact augmentation and evidence of respiratory phasicity.  No intraluminal thrombus identified.  Visualized portions of the greater saphenous systems appear patent.  Subcutaneous edema identified at the ankles bilaterally.  IMPRESSION: No evidence of deep venous thrombosis in either lower extremity.   Original Report Authenticated By: Ulyses Southward, M.D.    US Venous Img Upper Uni Right  12/25/2012  *RADIOLOGY REPORT*  Clinical Data: Breast cancer, pitting edema right arm for 3 days, numbness and tingling  RIGHT UPPER EXTREMITY VENOUS DUPLEX ULTRASOUND  Technique:  Gray-scale sonography with graded compression, as well as color Doppler and duplex ultrasound were performed to evaluate the upper extremity deep venous system from the level of the subclavian vein and including the jugular, axillary, basilic and upper cephalic vein.  Spectral Doppler was utilized to evaluate flow at rest and with distal augmentation maneuvers.  Comparison:  None  Findings: Deep venous system of the right upper extremity is patent and compressible. Spontaneous venous flow present with evidence of respiratory phasicity and augmentation. No intraluminal thrombus identified. Visualized portion of superficial venous system also appears patent. Subcutaneous edema noted.  IMPRESSION: No evidence of deep venous thrombosis in right upper extremity.   Original Report Authenticated By: Ulyses Southward, M.D.     Microbiology: No results found for this or any previous visit (from the past 240 hour(s)).   Labs: Basic Metabolic Panel:  Recent Labs Lab 12/31/12 1516 01/01/13 0504 01/02/13 0459 01/03/13 0535 01/04/13 0559  NA 133* 133* 135 138 140  K 4.3 3.9 3.3* 2.9* 3.1*  CL 96 96 96 97 99  CO2 27 26 26 27 29   GLUCOSE 112* 104* 113* 141* 104*  BUN 13 11 11 15 15   CREATININE 0.92 0.77 0.74 0.77 0.79  CALCIUM 8.2* 8.1* 8.1* 8.4 8.5   Liver Function Tests:  Recent Labs Lab 12/31/12 1516  AST 11  ALT 16  ALKPHOS 64  BILITOT 0.9  PROT 5.9*  ALBUMIN 3.1*   No results found for this basename: LIPASE, AMYLASE,  in the last 168 hours No results found for this basename: AMMONIA,  in the  last  168 hours CBC:  Recent Labs Lab 12/31/12 1516 01/02/13 0459 01/03/13 0535 01/04/13 0559  WBC 1.6* 1.6* 3.5* 3.9*  NEUTROABS 1.2*  --   --   --   HGB 8.4* 8.1* 8.3* 8.1*  HCT 24.2* 23.2* 23.5* 23.2*  MCV 97.6 96.3 97.1 98.3  PLT 88* 70* 67* 59*   Cardiac Enzymes: No results found for this basename: CKTOTAL, CKMB, CKMBINDEX, TROPONINI,  in the last 168 hours BNP: BNP (last 3 results) No results found for this basename: PROBNP,  in the last 8760 hours CBG: No results found for this basename: GLUCAP,  in the last 168 hours    Signed:  Izreal Kock  [facility] 01/04/2013, 5:55 PM    Attending: The patient was seen and examined. She was discussed with NP, Ms. black. The above-noted has been amended and/or annotated. Agree with the above.

## 2013-01-04 NOTE — Progress Notes (Deleted)
[facility] PROGRESS NOTE  Erika Nunez ZOX:[id number] DOB: Dec 18, 1950 DOA: 12/31/2012 PCP: Randall An, MD  Assessment/Plan: 1. Cellulitis of right upper extremity:  Continues to improve.. Clearly less erythema and swelling. Improved ROM, less pain. Will continue Vanc and unasyn day #4. Remains afebrile and non-toxic appearing. Anticipate ready for discharge tomorrow. Depending on degree of improvement, may not need IV antibiotics at discharge.  Active Problems:  2. Nausea/vomiting: somewhat chronic given she is on chemo. Last chemo 12/26/12. Yesterday initially tolerating diet. Several vomiting episodes in afternoon and during night. Diet changed to clears. Uses ativan and compazine at home to manage. Ativan and compazine resumed. Somewhat better this am. Will advance diet to full liquids  3. Hypokalemia: likely related to yesterday's vomiting. Will replete and recheck.    4. Breast cancer: appreciate oncology assistance. Surgical consult per oncology. Last chemo 12/26/12. White count trending up. Dr. Mariel Sleet following.   5. Hypertension: controlled. SBP range 130-142. Continue lisinopril and hydrochlorothiazide    Code Status: full Family Communication: family at bedside Disposition Plan: home hopefully tomorrow   Consultants:  Oncology  Procedures:  none  Antibiotics: Unasyn 12/31/12>>>  Vancomycin 12/31/12>>>   HPI/Subjective: Sitting up in bed. Reports feeling "alittle better but still nauseated" No vomiting since ativan at 5am.   Objective: Filed Vitals:   01/03/13 0507 01/03/13 1545 01/03/13 2205 01/04/13 0527  BP: 133/83 128/77 143/65 144/77  Pulse: 68 70 73 78  Temp: 97.9 F (36.6 C) 98.2 F (36.8 C) 98.3 F (36.8 C) 98.1 F (36.7 C)  TempSrc:  Oral  Oral  Resp: 20 18 18 20   Height:      Weight: 84.777 kg (186 lb 14.4 oz)     SpO2: 98% 98% 100% 99%    Intake/Output Summary (Last 24 hours) at 01/04/13 0658 Last data filed at 01/04/13  0358  Gross per 24 hour  Intake   1340 ml  Output      0 ml  Net   1340 ml   Filed Weights   12/31/12 1408 01/03/13 0507  Weight: 83.915 kg (185 lb) 84.777 kg (186 lb 14.4 oz)    Exam:   General:  Awake ill appearing NAD  Cardiovascular: RRR No MGR No LE edema  Respiratory: normal effort BS clear bilaterally no rhonchi  Abdomen: obese soft +BS non-tender to palpation  Musculoskeletal: right arm with decreasing erythema from marked borders. Much less edema and increased rom. Area of induration on lateral aspect of right elbow less warm and less red. No drainage. No fluctuance.   Data Reviewed: Basic Metabolic Panel:  Recent Labs Lab 12/31/12 1516 01/01/13 0504 01/02/13 0459 01/03/13 0535  NA 133* 133* 135 138  K 4.3 3.9 3.3* 2.9*  CL 96 96 96 97  CO2 27 26 26 27   GLUCOSE 112* 104* 113* 141*  BUN 13 11 11 15   CREATININE 0.92 0.77 0.74 0.77  CALCIUM 8.2* 8.1* 8.1* 8.4   Liver Function Tests:  Recent Labs Lab 12/31/12 1516  AST 11  ALT 16  ALKPHOS 64  BILITOT 0.9  PROT 5.9*  ALBUMIN 3.1*   No results found for this basename: LIPASE, AMYLASE,  in the last 168 hours No results found for this basename: AMMONIA,  in the last 168 hours CBC:  Recent Labs Lab 12/31/12 1516 01/02/13 0459 01/03/13 0535 01/04/13 0559  WBC 1.6* 1.6* 3.5* 3.9*  NEUTROABS 1.2*  --   --   --   HGB 8.4* 8.1* 8.3* 8.1*  HCT 24.2* 23.2* 23.5* 23.2*  MCV 97.6 96.3 97.1 98.3  PLT 88* 70* 67* 59*   Cardiac Enzymes: No results found for this basename: CKTOTAL, CKMB, CKMBINDEX, TROPONINI,  in the last 168 hours BNP (last 3 results) No results found for this basename: PROBNP,  in the last 8760 hours CBG: No results found for this basename: GLUCAP,  in the last 168 hours  No results found for this or any previous visit (from the past 240 hour(s)).   Studies: No results found.  Scheduled Meds: . ampicillin-sulbactam (UNASYN) IV  3 g Intravenous Q6H  . enoxaparin (LOVENOX)  injection  40 mg Subcutaneous Q24H  . lisinopril  20 mg Oral Daily   And  . hydrochlorothiazide  25 mg Oral Daily  . potassium chloride SA  20 mEq Oral TID  . sertraline  150 mg Oral Daily  . simvastatin  40 mg Oral q1800  . sodium chloride  3 mL Intravenous Q12H  . vancomycin  1,250 mg Intravenous Q12H   Continuous Infusions:   Principal Problem:   Cellulitis of right upper extremity Active Problems:   Breast cancer   Hypertension   Hypokalemia   Nausea with vomiting    Time spent: 30 minutes    Adalin Vanderploeg  [facility]  If 7PM-7AM, please contact night-coverage at [email or web address], password [facility] 01/04/2013, 6:58 AM  LOS: 4 days      Attending  The patient was seen and examined. She was discussed with NP, Ms. Vedia Coffer. Agree with findings.

## 2013-01-04 NOTE — Progress Notes (Signed)
Subjective: Patient seen lying in bed with daughter at bedside. Her right arm remains erythematous but significantly improved. Her right arm is also edematous but again, significantly improved.  There is discussion about her being discharged today and I think tat will be great for her. It is noted that her platelets are dropping and I wonder if this is from her prophylactic Lovenox. If she goes home today, of course, this will be discontinued.  She's also noted to be anemic with a hemoglobin below 9 g/dL. This is likely reactive to her infection plus or minus chemotherapy.  In light of these results, we will set her up for lab appointment next week to make sure that her platelet count is stabilizing and hopefully improving in addition to her hemoglobin. She may require an outpatient transfusion of blood depending on those results.  Due to her hospitalization, we'll defer chemotherapy x7 days this will be reflected in treatment plan.  A new schedule be provided the patient when she comes in next week for her followup lab appointment.  Objective: Vital signs in last 24 hours: Temp:  [98.1 F (36.7 C)-98.3 F (36.8 C)] 98.1 F (36.7 C) (04/24 0527) Pulse Rate:  [70-78] 78 (04/24 0527) Resp:  [18-20] 20 (04/24 0527) BP: (128-144)/(65-77) 144/77 mmHg (04/24 0527) SpO2:  [98 %-100 %] 99 % (04/24 0527)  Intake/Output from previous day: 04/23 0800 - 04/24 0759 In: 1340 [P.O.:240; IV Piggyback:1100] Out: -  Intake/Output this shift:    General appearance: alert, cooperative, no distress and alopecia Extremities: right arm erythema and edema  Lab Results:   Recent Labs  01/03/13 0535 01/04/13 0559  WBC 3.5* 3.9*  HGB 8.3* 8.1*  HCT 23.5* 23.2*  PLT 67* 59*   BMET  Recent Labs  01/03/13 0535 01/04/13 0559  NA 138 140  K 2.9* 3.1*  CL 97 99  CO2 27 29  GLUCOSE 141* 104*  BUN 15 15  CREATININE 0.77 0.79  CALCIUM 8.4 8.5    Studies/Results: No results  found.  Medications: I have reviewed the patient's current medications.  Assessment/Plan: 1. Stage IIIC/Stage IV, Her2+, ER-, PR-, KI-67 70%, breast cancer. Starting systemic chemotherapy consisting of TCH on 10/23/2012. She is Day 67 Cycle 4 with Neulasta support on 12/26/2012. She has had a wonderful response to therapy thus far.  2. Cellulitis of right UE, on IV antibiotics, Vancomycin and Unasyn. Improving.  3. Neutropenia, secondary to chemotherapy. Resolving. Recommend D/C Neutropenic Precautions.  4. Anemia, reactive +/- chemotherapy. Stable.  5. Thrombocytopenia, reactive +/- chemotherapy. Stable 6. Will defer chemotherapy x 7 days.  7. Will schedule her for a lab appt in 1 week.    LOS: 4 days    KEFALAS,THOMAS 01/04/2013

## 2013-01-08 ENCOUNTER — Other Ambulatory Visit (HOSPITAL_COMMUNITY): Payer: Self-pay | Admitting: Oncology

## 2013-01-08 ENCOUNTER — Encounter (HOSPITAL_COMMUNITY): Payer: Self-pay | Admitting: Oncology

## 2013-01-09 ENCOUNTER — Other Ambulatory Visit (HOSPITAL_COMMUNITY): Payer: Self-pay | Admitting: Oncology

## 2013-01-09 DIAGNOSIS — C50911 Malignant neoplasm of unspecified site of right female breast: Secondary | ICD-10-CM

## 2013-01-09 MED ORDER — HYDROCODONE-ACETAMINOPHEN 5-325 MG PO TABS: 1.0000 | ORAL_TABLET | ORAL | Status: DC | PRN

## 2013-01-11 ENCOUNTER — Encounter (HOSPITAL_COMMUNITY): Payer: Medicaid Other | Attending: Oncology

## 2013-01-11 ENCOUNTER — Telehealth (HOSPITAL_COMMUNITY): Payer: Self-pay

## 2013-01-11 ENCOUNTER — Other Ambulatory Visit (HOSPITAL_COMMUNITY): Payer: Self-pay | Admitting: Oncology

## 2013-01-11 DIAGNOSIS — D649 Anemia, unspecified: Secondary | ICD-10-CM | POA: Insufficient documentation

## 2013-01-11 DIAGNOSIS — IMO0002 Reserved for concepts with insufficient information to code with codable children: Secondary | ICD-10-CM | POA: Insufficient documentation

## 2013-01-11 DIAGNOSIS — C50911 Malignant neoplasm of unspecified site of right female breast: Secondary | ICD-10-CM

## 2013-01-11 DIAGNOSIS — L03113 Cellulitis of right upper limb: Secondary | ICD-10-CM

## 2013-01-11 DIAGNOSIS — C50919 Malignant neoplasm of unspecified site of unspecified female breast: Secondary | ICD-10-CM | POA: Insufficient documentation

## 2013-01-11 LAB — CBC WITH DIFFERENTIAL/PLATELET
Eosinophils Relative: 0 % (ref 0–5)
HCT: 25.1 % — ABNORMAL LOW (ref 36.0–46.0)
Hemoglobin: 8.8 g/dL — ABNORMAL LOW (ref 12.0–15.0)
Lymphocytes Relative: 22 % (ref 12–46)
Lymphs Abs: 1.9 10*3/uL (ref 0.7–4.0)
MCH: 35.2 pg — ABNORMAL HIGH (ref 26.0–34.0)
MCV: 100.4 fL — ABNORMAL HIGH (ref 78.0–100.0)
Monocytes Absolute: 0.9 10*3/uL (ref 0.1–1.0)
Monocytes Relative: 10 % (ref 3–12)
RBC: 2.5 MIL/uL — ABNORMAL LOW (ref 3.87–5.11)
WBC: 8.5 10*3/uL (ref 4.0–10.5)

## 2013-01-11 LAB — BASIC METABOLIC PANEL
CO2: 26 mEq/L (ref 19–32)
Chloride: 97 mEq/L (ref 96–112)
Glucose, Bld: 91 mg/dL (ref 70–99)
Sodium: 135 mEq/L (ref 135–145)

## 2013-01-11 NOTE — Progress Notes (Signed)
Labs drawn today for cbc/diff,bmp 

## 2013-01-11 NOTE — Telephone Encounter (Signed)
Message left for patient to take potassium 4x a day and to let us know when she gets low on her medication.  Patient asked to call the clinic to let us know she got her instructions and she understands.

## 2013-01-12 ENCOUNTER — Ambulatory Visit (HOSPITAL_COMMUNITY): Payer: Medicaid Other | Attending: Oncology

## 2013-01-15 ENCOUNTER — Inpatient Hospital Stay (HOSPITAL_COMMUNITY): Payer: Medicaid Other

## 2013-01-16 ENCOUNTER — Other Ambulatory Visit (HOSPITAL_COMMUNITY): Payer: Self-pay | Admitting: Oncology

## 2013-01-16 ENCOUNTER — Ambulatory Visit (HOSPITAL_COMMUNITY): Payer: Medicaid Other

## 2013-01-16 DIAGNOSIS — C50911 Malignant neoplasm of unspecified site of right female breast: Secondary | ICD-10-CM

## 2013-01-16 MED ORDER — DEXAMETHASONE 4 MG PO TABS: ORAL_TABLET | ORAL | Status: DC

## 2013-01-22 ENCOUNTER — Encounter (HOSPITAL_BASED_OUTPATIENT_CLINIC_OR_DEPARTMENT_OTHER): Payer: Medicaid Other

## 2013-01-22 ENCOUNTER — Other Ambulatory Visit (HOSPITAL_COMMUNITY): Payer: Self-pay | Admitting: Oncology

## 2013-01-22 VITALS — BP 124/63 | HR 117 | Temp 98.0°F | Resp 20 | Wt 203.6 lb

## 2013-01-22 DIAGNOSIS — Z5111 Encounter for antineoplastic chemotherapy: Secondary | ICD-10-CM

## 2013-01-22 DIAGNOSIS — C773 Secondary and unspecified malignant neoplasm of axilla and upper limb lymph nodes: Secondary | ICD-10-CM

## 2013-01-22 DIAGNOSIS — D649 Anemia, unspecified: Secondary | ICD-10-CM

## 2013-01-22 DIAGNOSIS — C50911 Malignant neoplasm of unspecified site of right female breast: Secondary | ICD-10-CM

## 2013-01-22 DIAGNOSIS — C50919 Malignant neoplasm of unspecified site of unspecified female breast: Secondary | ICD-10-CM

## 2013-01-22 LAB — CBC WITH DIFFERENTIAL/PLATELET
Basophils Absolute: 0 10*3/uL (ref 0.0–0.1)
Eosinophils Absolute: 0 10*3/uL (ref 0.0–0.7)
Eosinophils Relative: 0 % (ref 0–5)
HCT: 26.5 % — ABNORMAL LOW (ref 36.0–46.0)
Lymphocytes Relative: 7 % — ABNORMAL LOW (ref 12–46)
MCH: 36.1 pg — ABNORMAL HIGH (ref 26.0–34.0)
MCHC: 34 g/dL (ref 30.0–36.0)
MCV: 106.4 fL — ABNORMAL HIGH (ref 78.0–100.0)
Monocytes Absolute: 0.7 10*3/uL (ref 0.1–1.0)
RDW: 20.2 % — ABNORMAL HIGH (ref 11.5–15.5)
WBC: 10.1 10*3/uL (ref 4.0–10.5)

## 2013-01-22 LAB — COMPREHENSIVE METABOLIC PANEL
AST: 13 U/L (ref 0–37)
CO2: 22 mEq/L (ref 19–32)
Calcium: 6.9 mg/dL — ABNORMAL LOW (ref 8.4–10.5)
Creatinine, Ser: 1 mg/dL (ref 0.50–1.10)
GFR calc Af Amer: 68 mL/min — ABNORMAL LOW (ref >90)
GFR calc non Af Amer: 59 mL/min — ABNORMAL LOW (ref >90)

## 2013-01-22 MED ORDER — SODIUM CHLORIDE 0.9 % IJ SOLN
10.0000 mL | INTRAMUSCULAR | Status: DC | PRN
  Administered 2013-01-22: 10 mL
  Filled 2013-01-22: qty 10

## 2013-01-22 MED ORDER — DOCETAXEL CHEMO INJECTION 160 MG/16ML
75.0000 mg/m2 | Freq: Once | INTRAVENOUS | Status: AC
  Administered 2013-01-22: 150 mg via INTRAVENOUS
  Filled 2013-01-22: qty 15

## 2013-01-22 MED ORDER — PALONOSETRON HCL INJECTION 0.25 MG/5ML
0.2500 mg | Freq: Once | INTRAVENOUS | Status: AC
  Administered 2013-01-22: 0.25 mg via INTRAVENOUS

## 2013-01-22 MED ORDER — SODIUM CHLORIDE 0.9 % IV SOLN
650.0000 mg | Freq: Once | INTRAVENOUS | Status: AC
  Administered 2013-01-22: 650 mg via INTRAVENOUS
  Filled 2013-01-22: qty 65

## 2013-01-22 MED ORDER — PALONOSETRON HCL INJECTION 0.25 MG/5ML
INTRAVENOUS | Status: AC
  Filled 2013-01-22: qty 5

## 2013-01-22 MED ORDER — SODIUM CHLORIDE 0.9 % IV SOLN
Freq: Once | INTRAVENOUS | Status: AC
  Administered 2013-01-22: 500 mL via INTRAVENOUS

## 2013-01-22 MED ORDER — HEPARIN SOD (PORK) LOCK FLUSH 100 UNIT/ML IV SOLN
INTRAVENOUS | Status: AC
  Filled 2013-01-22: qty 5

## 2013-01-22 MED ORDER — HEPARIN SOD (PORK) LOCK FLUSH 100 UNIT/ML IV SOLN
500.0000 [IU] | Freq: Once | INTRAVENOUS | Status: AC | PRN
  Administered 2013-01-22: 500 [IU]
  Filled 2013-01-22: qty 5

## 2013-01-22 MED ORDER — SODIUM CHLORIDE 0.9 % IV SOLN
Freq: Once | INTRAVENOUS | Status: AC
  Administered 2013-01-22: 12:00:00 via INTRAVENOUS
  Filled 2013-01-22: qty 5

## 2013-01-22 NOTE — Progress Notes (Signed)
01/22/2013 10:33 AM Called [name] in the lab to assess whether they have run the patients labs she is waiting on - informed she has just started to run the CBC.  Erika Nunez informed of delay.  01/22/2013 2:32 PM Erika Nunez tolerated infusions well and without incident; verbalizes understanding for follow-up.  No distress noted at time of discharge and patient was discharged home with her daughter.  Erika Nunez is returning tomorrow for neulasta and 1 unit PRBCs - verbalized understanding for follow-up.    Erika Nunez remains with swelling to right arm, bilateral legs - patient reports swelling is worse after being on her "feet all day yesterday for Mother's Day."  Dr. Mariel Sleet advised of swelling - PRBCs ordered and patient can receive chemo today.

## 2013-01-23 ENCOUNTER — Encounter (HOSPITAL_BASED_OUTPATIENT_CLINIC_OR_DEPARTMENT_OTHER): Payer: Medicaid Other

## 2013-01-23 ENCOUNTER — Encounter (HOSPITAL_COMMUNITY): Payer: Medicaid Other

## 2013-01-23 VITALS — BP 115/78 | HR 96 | Temp 97.7°F | Resp 18 | Wt 200.6 lb

## 2013-01-23 DIAGNOSIS — C50919 Malignant neoplasm of unspecified site of unspecified female breast: Secondary | ICD-10-CM

## 2013-01-23 DIAGNOSIS — C50911 Malignant neoplasm of unspecified site of right female breast: Secondary | ICD-10-CM

## 2013-01-23 DIAGNOSIS — D649 Anemia, unspecified: Secondary | ICD-10-CM

## 2013-01-23 MED ORDER — HEPARIN SOD (PORK) LOCK FLUSH 100 UNIT/ML IV SOLN
500.0000 [IU] | Freq: Once | INTRAVENOUS | Status: AC
  Administered 2013-01-23: 500 [IU] via INTRAVENOUS
  Filled 2013-01-23: qty 5

## 2013-01-23 MED ORDER — SODIUM CHLORIDE 0.9 % IV SOLN
INTRAVENOUS | Status: DC
  Administered 2013-01-23: 10:00:00 via INTRAVENOUS

## 2013-01-23 MED ORDER — PEGFILGRASTIM INJECTION 6 MG/0.6ML: 6.0000 mg | Freq: Once | SUBCUTANEOUS | Status: DC

## 2013-01-23 MED ORDER — HEPARIN SOD (PORK) LOCK FLUSH 100 UNIT/ML IV SOLN
INTRAVENOUS | Status: AC
  Filled 2013-01-23: qty 5

## 2013-01-23 MED ORDER — SODIUM CHLORIDE 0.9 % IJ SOLN
10.0000 mL | INTRAMUSCULAR | Status: AC | PRN
  Administered 2013-01-23: 10 mL
  Filled 2013-01-23: qty 10

## 2013-01-23 MED ORDER — PEGFILGRASTIM INJECTION 6 MG/0.6ML
SUBCUTANEOUS | Status: AC
  Filled 2013-01-23: qty 0.6

## 2013-01-23 NOTE — Progress Notes (Signed)
Erika Nunez presents today for injection per MD orders. Neulasta 6mg administered SQ in left Abdomen. Administration without incident. Patient tolerated well.  

## 2013-01-24 ENCOUNTER — Encounter (HOSPITAL_BASED_OUTPATIENT_CLINIC_OR_DEPARTMENT_OTHER): Payer: Medicaid Other | Admitting: Oncology

## 2013-01-24 ENCOUNTER — Telehealth (HOSPITAL_COMMUNITY): Payer: Self-pay | Admitting: *Deleted

## 2013-01-24 ENCOUNTER — Ambulatory Visit (HOSPITAL_COMMUNITY)
Admission: RE | Admit: 2013-01-24 | Discharge: 2013-01-24 | Disposition: A | Discharge status: Home / Self Care | Payer: Medicaid Other | Source: Ambulatory Visit | Attending: Oncology | Admitting: Oncology

## 2013-01-24 VITALS — BP 120/75 | HR 91 | Temp 98.3°F | Resp 16 | Wt 204.5 lb

## 2013-01-24 DIAGNOSIS — C773 Secondary and unspecified malignant neoplasm of axilla and upper limb lymph nodes: Secondary | ICD-10-CM

## 2013-01-24 DIAGNOSIS — Z09 Encounter for follow-up examination after completed treatment for conditions other than malignant neoplasm: Secondary | ICD-10-CM

## 2013-01-24 DIAGNOSIS — C50911 Malignant neoplasm of unspecified site of right female breast: Secondary | ICD-10-CM

## 2013-01-24 DIAGNOSIS — I10 Essential (primary) hypertension: Secondary | ICD-10-CM | POA: Insufficient documentation

## 2013-01-24 DIAGNOSIS — R609 Edema, unspecified: Secondary | ICD-10-CM

## 2013-01-24 DIAGNOSIS — Z6831 Body mass index (BMI) 31.0-31.9, adult: Secondary | ICD-10-CM | POA: Insufficient documentation

## 2013-01-24 DIAGNOSIS — D649 Anemia, unspecified: Secondary | ICD-10-CM

## 2013-01-24 DIAGNOSIS — Z171 Estrogen receptor negative status [ER-]: Secondary | ICD-10-CM

## 2013-01-24 DIAGNOSIS — Z9221 Personal history of antineoplastic chemotherapy: Secondary | ICD-10-CM | POA: Insufficient documentation

## 2013-01-24 DIAGNOSIS — C50919 Malignant neoplasm of unspecified site of unspecified female breast: Secondary | ICD-10-CM | POA: Insufficient documentation

## 2013-01-24 LAB — CBC
MCH: 35.7 pg — ABNORMAL HIGH (ref 26.0–34.0)
MCHC: 34.6 g/dL (ref 30.0–36.0)
Platelets: 220 10*3/uL (ref 150–400)
RBC: 2.77 MIL/uL — ABNORMAL LOW (ref 3.87–5.11)
RDW: 23.1 % — ABNORMAL HIGH (ref 11.5–15.5)

## 2013-01-24 NOTE — Progress Notes (Signed)
*  PRELIMINARY RESULTS* Echocardiogram 2D Echocardiogram has been performed.  Conrad [place] 01/24/2013, 10:44 AM

## 2013-01-24 NOTE — Telephone Encounter (Signed)
Tiffany at the [facility] called and gave Mrs. Dancel an appt with Maylon Cos on 03/08/2013 at 9:00AM and if another opening comes available before she will let the patient know and She also called the patient this afternoon to let her know of the appointment.

## 2013-01-24 NOTE — Patient Instructions (Addendum)
[facility] Discharge Instructions  RECOMMENDATIONS MADE BY THE CONSULTANT AND ANY TEST RESULTS WILL BE SENT TO YOUR REFERRING PHYSICIAN.  EXAM FINDINGS BY THE PHYSICIAN TODAY AND SIGNS OR SYMPTOMS TO REPORT TO CLINIC OR PRIMARY PHYSICIAN: Exam and discussion by MD.  Will be getting a consult with Dr. Thersa Salt at [facility] in [place], [place] counseling at [place] and PT consult for the swelling in your right arm.  Will check blood work today to make sure your hemoglobin has come up.  MEDICATIONS PRESCRIBED:  none  INSTRUCTIONS GIVEN AND DISCUSSED: Report fevers, chills, uncontrolled nausea, vomiting or other problems.  SPECIAL INSTRUCTIONS/FOLLOW-UP: Follow-up in 3 weeks with the PA.  Thank you for choosing [facility] to provide your oncology and hematology care.  To afford each patient quality time with our providers, please arrive at least 15 minutes before your scheduled appointment time.  With your help, our goal is to use those 15 minutes to complete the necessary work-up to ensure our physicians have the information they need to help with your evaluation and healthcare recommendations.    Effective January 1st, 2014, we ask that you re-schedule your appointment with our physicians should you arrive 10 or more minutes late for your appointment.  We strive to give you quality time with our providers, and arriving late affects you and other patients whose appointments are after yours.    Again, thank you for choosing [facility].  Our hope is that these requests will decrease the amount of time that you wait before being seen by our physicians.       _____________________________________________________________  Should you have questions after your visit to [facility], please contact our office at [phone] between the hours of 8:30 a.m. and 5:00 p.m.  Voicemails left after 4:30 p.m. will not be  returned until the following business day.  For prescription refill requests, have your pharmacy contact our office with your prescription refill request.

## 2013-01-24 NOTE — Progress Notes (Signed)
#  1 breast cancer, right-sided, advanced, (T4 BN2) with possible metastatic disease to the left breast versus a simultaneous primary. Her cancer was ER negative, PR negative, Ki-67 marker high at 70+ percent, and HER-2 was amplified with a ratio 2.89. She has been treated with carboplatinum and docetaxel as well as Herceptin. Herceptin was held recently since she developed diffuse edema of her legs and her 2-D echo is pending from today. He was done this morning. If normal we can resume a trastuzumab. We also transfused her one unit of blood which she states help her tremendously yesterday and he urinated a tremendous not of fluid out during the night. She thinks her legs are less swollen since the transfusion. We will check a hemoglobin today because of the transfusion and indeed she may need another unit. I didn't think we can justify with her hemoglobin however of 9 yesterday so we just gave her one unit with that level.  She still has nodular mildly areas around the breast area are still most likely tumor in my opinion. There is a one half either of an area on the right breast which has of course shrunk nearly completely. She appears to have had an auto mastectomy. The left breast has also opened a little bit at the incision site but there is no palpable mass that I can appreciate. She has no lymph nodes palpable now in the right axilla left axilla supraclavicular areas infraclavicular areas or cervical areas. Her abdomen is obese but remains soft she has 2+ pitting edema both legs and 2+to 3+ edema of the right arm with mild redness still from the amount of edema. Her heart shows a regular rhythm and rate without murmur or gallop. Lungs are clear.  We will see with her 2-D echo shows and see her back in 3 weeks. She will have cycle 6 approximately at that time. We will continue with Herceptin. We will get a consultation with physical therapy, genetics, and Dr. Thersa Salt radiation therapy. I do not think she  is going to be a surgical candidate. The goal is control this disease for as long as possible.

## 2013-01-24 NOTE — Progress Notes (Signed)
Erika Nunez presented for labwork. Labs per MD order drawn via Peripheral Line 23 gauge needle inserted in left AC  Good blood return present. Procedure without incident.  Needle removed intact. Patient tolerated procedure well.

## 2013-01-26 LAB — TYPE AND SCREEN
ABO/RH(D): A POS
Antibody Screen: NEGATIVE
Unit division: 0
Unit division: 0

## 2013-02-12 ENCOUNTER — Encounter (HOSPITAL_COMMUNITY): Payer: Medicaid Other | Attending: Oncology

## 2013-02-12 VITALS — BP 134/54 | HR 76 | Temp 97.6°F | Resp 18 | Wt 201.0 lb

## 2013-02-12 DIAGNOSIS — C50911 Malignant neoplasm of unspecified site of right female breast: Secondary | ICD-10-CM

## 2013-02-12 DIAGNOSIS — C50919 Malignant neoplasm of unspecified site of unspecified female breast: Secondary | ICD-10-CM

## 2013-02-12 DIAGNOSIS — Z5112 Encounter for antineoplastic immunotherapy: Secondary | ICD-10-CM

## 2013-02-12 DIAGNOSIS — C773 Secondary and unspecified malignant neoplasm of axilla and upper limb lymph nodes: Secondary | ICD-10-CM

## 2013-02-12 DIAGNOSIS — E876 Hypokalemia: Secondary | ICD-10-CM | POA: Insufficient documentation

## 2013-02-12 LAB — CBC WITH DIFFERENTIAL/PLATELET
Basophils Absolute: 0 10*3/uL (ref 0.0–0.1)
Basophils Relative: 0 % (ref 0–1)
Lymphocytes Relative: 6 % — ABNORMAL LOW (ref 12–46)
MCHC: 34.6 g/dL (ref 30.0–36.0)
Neutro Abs: 13.4 10*3/uL — ABNORMAL HIGH (ref 1.7–7.7)
Platelets: 183 10*3/uL (ref 150–400)
RDW: 18.6 % — ABNORMAL HIGH (ref 11.5–15.5)
WBC: 15.4 10*3/uL — ABNORMAL HIGH (ref 4.0–10.5)

## 2013-02-12 LAB — COMPREHENSIVE METABOLIC PANEL
ALT: 13 U/L (ref 0–35)
AST: 15 U/L (ref 0–37)
Albumin: 3.6 g/dL (ref 3.5–5.2)
CO2: 30 mEq/L (ref 19–32)
Calcium: 7.6 mg/dL — ABNORMAL LOW (ref 8.4–10.5)
Chloride: 94 mEq/L — ABNORMAL LOW (ref 96–112)
GFR calc non Af Amer: 48 mL/min — ABNORMAL LOW (ref >90)
Sodium: 139 mEq/L (ref 135–145)
Total Bilirubin: 0.2 mg/dL — ABNORMAL LOW (ref 0.3–1.2)

## 2013-02-12 MED ORDER — DIPHENHYDRAMINE HCL 25 MG PO CAPS
ORAL_CAPSULE | ORAL | Status: AC
  Filled 2013-02-12: qty 2

## 2013-02-12 MED ORDER — ACETAMINOPHEN 325 MG PO TABS
ORAL_TABLET | ORAL | Status: AC
  Filled 2013-02-12: qty 2

## 2013-02-12 MED ORDER — TRASTUZUMAB CHEMO INJECTION 440 MG
8.0000 mg/kg | Freq: Once | INTRAVENOUS | Status: AC
  Administered 2013-02-12: 735 mg via INTRAVENOUS
  Filled 2013-02-12: qty 35

## 2013-02-12 MED ORDER — HEPARIN SOD (PORK) LOCK FLUSH 100 UNIT/ML IV SOLN
INTRAVENOUS | Status: AC
  Filled 2013-02-12: qty 5

## 2013-02-12 MED ORDER — DIPHENHYDRAMINE HCL 25 MG PO CAPS
50.0000 mg | ORAL_CAPSULE | Freq: Once | ORAL | Status: AC
  Administered 2013-02-12: 50 mg via ORAL

## 2013-02-12 MED ORDER — PALONOSETRON HCL INJECTION 0.25 MG/5ML
0.2500 mg | Freq: Once | INTRAVENOUS | Status: AC
  Administered 2013-02-12: 0.25 mg via INTRAVENOUS

## 2013-02-12 MED ORDER — SODIUM CHLORIDE 0.9 % IV SOLN
570.0000 mg | Freq: Once | INTRAVENOUS | Status: AC
  Administered 2013-02-12: 570 mg via INTRAVENOUS
  Filled 2013-02-12: qty 57

## 2013-02-12 MED ORDER — HEPARIN SOD (PORK) LOCK FLUSH 100 UNIT/ML IV SOLN
500.0000 [IU] | Freq: Once | INTRAVENOUS | Status: AC | PRN
  Administered 2013-02-12: 500 [IU]
  Filled 2013-02-12: qty 5

## 2013-02-12 MED ORDER — ACETAMINOPHEN 325 MG PO TABS
650.0000 mg | ORAL_TABLET | Freq: Once | ORAL | Status: AC
  Administered 2013-02-12: 650 mg via ORAL

## 2013-02-12 MED ORDER — DOCETAXEL CHEMO INJECTION 160 MG/16ML
75.0000 mg/m2 | Freq: Once | INTRAVENOUS | Status: AC
  Administered 2013-02-12: 150 mg via INTRAVENOUS
  Filled 2013-02-12: qty 15

## 2013-02-12 MED ORDER — SODIUM CHLORIDE 0.9 % IJ SOLN
10.0000 mL | INTRAMUSCULAR | Status: DC | PRN
  Administered 2013-02-12: 10 mL
  Filled 2013-02-12: qty 10

## 2013-02-12 MED ORDER — PALONOSETRON HCL INJECTION 0.25 MG/5ML
INTRAVENOUS | Status: AC
  Filled 2013-02-12: qty 5

## 2013-02-12 MED ORDER — SODIUM CHLORIDE 0.9 % IV SOLN
Freq: Once | INTRAVENOUS | Status: AC
  Administered 2013-02-12: 10:00:00 via INTRAVENOUS

## 2013-02-12 MED ORDER — SODIUM CHLORIDE 0.9 % IV SOLN
Freq: Once | INTRAVENOUS | Status: AC
  Administered 2013-02-12: 12:00:00 via INTRAVENOUS
  Filled 2013-02-12: qty 5

## 2013-02-12 NOTE — Progress Notes (Signed)
Tolerated chemo well. 

## 2013-02-13 ENCOUNTER — Encounter (HOSPITAL_BASED_OUTPATIENT_CLINIC_OR_DEPARTMENT_OTHER): Payer: Medicaid Other

## 2013-02-13 VITALS — BP 138/66 | HR 92

## 2013-02-13 DIAGNOSIS — C50911 Malignant neoplasm of unspecified site of right female breast: Secondary | ICD-10-CM

## 2013-02-13 DIAGNOSIS — C50919 Malignant neoplasm of unspecified site of unspecified female breast: Secondary | ICD-10-CM

## 2013-02-13 DIAGNOSIS — C773 Secondary and unspecified malignant neoplasm of axilla and upper limb lymph nodes: Secondary | ICD-10-CM

## 2013-02-13 MED ORDER — PEGFILGRASTIM INJECTION 6 MG/0.6ML
6.0000 mg | Freq: Once | SUBCUTANEOUS | Status: AC
  Administered 2013-02-13: 6 mg via SUBCUTANEOUS

## 2013-02-13 MED ORDER — PEGFILGRASTIM INJECTION 6 MG/0.6ML
SUBCUTANEOUS | Status: AC
  Filled 2013-02-13: qty 0.6

## 2013-02-13 NOTE — Progress Notes (Signed)
Tolerated Neulasta 6 mg sub-q to lower left abd well.

## 2013-02-14 NOTE — Progress Notes (Signed)
Randall An, MD [address] [address]  Breast cancer, right  CURRENT THERAPY: S/P 6 cycles of Carboplatin/Docetaxel (10/23/2012- 02/12/2013) with Herceptin   INTERVAL HISTORY: Bristal Steffy Barrette 62 y.o. female returns for  regular  visit for followup of breast cancer, right-sided, advanced, (T4B, N2) with possible metastatic disease to the left breast versus a simultaneous primary. Her cancer was ER negative, PR negative, Ki-67 marker high at 70+ percent, and HER-2 was amplified with a ratio 2.89. She has been treated with carboplatin/docetaxel x 6 cycles (10/23/2012- 02/12/2013) plus Herceptin which will continue for 52 weeks.  Herceptin was recently held due to development of edema. 2D echo was performed on 01/24/2013 and EF was noted to be stable at 60-65%.  As a result, Herceptin was re-initiated.   Shalisha is doing well. She denies any complaints today.  She reports that she did see radiation oncologist, Dr. Thersa Salt, recently.  She reports that he does not want to give her radiation at this point in time but would rather wait until after her PET scan to formulate any radiation plan. She reports that she may not need radiation.  Yesterday, the patient's case was presented Dr. Mariel Sleet. After a long discussion, we decided to offer the patient Perjeta.  I spent some time with the patient discussing the risks, benefits, alternatives, and side effects of the addition of Perjeta to her therapy plan. We spent some time reviewing recent study results regarding this medication. She is introduced about the addition of this medication. We can always discontinue the medication in the future if necessary. She does know that Perjeta carries the risk of cardiotoxicity which we will continue to monitor with the Herceptin.  Marca Ancona denies any complaints. Her right arm remains edematous. It is mildly erythematous but stable. She reports that it is better.  Oncologically, the patient denies any  complaints and ROS questioning is negative.  She is accompanied by her son today.  Past Medical History  Diagnosis Date  . Hypertension   . High cholesterol   . Cancer   . Breast cancer   . Allergy     seasonal allergies  . Arthritis   . Anxiety   . Breast mass, left 2014  . Breast mass, right 2014  . 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     is allergic to codeine.  Ms. Currie had no medications administered during this visit.  Past Surgical History  Procedure Laterality Date  . Cholecystectomy    . Tubal ligation    . Tonsillectomy    . Breast biopsy  10/02/2012    Procedure: BREAST BIOPSY;  Surgeon: Fabio Bering, MD;  Location: [facility];  Service: General;  Laterality: Right;  . Portacath placement Left 10/20/2012  . Breast biopsy Left 10/20/2012    Procedure: BREAST BIOPSY;  Surgeon: Fabio Bering, MD;  Location: [facility];  Service: General;  Laterality: Left;  Procedure end 1142  . Portacath placement Left 10/20/2012    Procedure: INSERTION PORT-A-CATH;  Surgeon: Fabio Bering, MD;  Location: [facility];  Service: General;  Laterality: Left;  Procedure began 1143; left subclavian    Denies any headaches, dizziness, double vision, fevers, chills, night sweats, nausea, vomiting, diarrhea, constipation, chest pain, heart palpitations, shortness of breath, blood in stool, black tarry stool, urinary pain, urinary  burning, urinary frequency, hematuria.   PHYSICAL EXAMINATION  ECOG PERFORMANCE STATUS: 1 - Symptomatic but completely ambulatory  There were no vitals filed for this visit.  GENERAL:alert, no distress, well nourished, well developed, comfortable, cooperative, obese and smiling SKIN: skin color, texture, turgor are  normal, no rashes or significant lesions HEAD: Normocephalic, No masses, lesions, tenderness or abnormalities, alopecia EYES: normal, EOMI, Conjunctiva are pink and non-injected EARS: External ears normal OROPHARYNX:mucous membranes are moist  NECK: supple, no adenopathy, thyroid normal size, non-tender, without nodularity, no stridor, non-tender, trachea midline LYMPH:  no palpable lymphadenopathy, no hepatosplenomegaly BREAST:right sided auto mastectomy.  Left sided steri-strips in place without any indication of erythema or infection LUNGS: clear to auscultation and percussion HEART: regular rate & rhythm, no murmurs, no gallops, S1 normal and S2 normal ABDOMEN:abdomen soft, non-tender, obese and normal bowel sounds BACK: Back symmetric, no curvature., No CVA tenderness EXTREMITIES:less then 2 second capillary refill, no joint deformities, effusion, or inflammation, no skin discoloration, no clubbing, no cyanosis, positive findings:  right arm erythema with 2+ pitting edema  NEURO: alert & oriented x 3 with fluent speech, no focal motor/sensory deficits, gait normal    LABORATORY DATA: CBC    Component Value Date/Time   WBC 15.4* 02/12/2013 0951   RBC 2.73* 02/12/2013 0951   HGB 10.1* 02/12/2013 0951   HCT 29.2* 02/12/2013 0951   PLT 183 02/12/2013 0951   MCV 107.0* 02/12/2013 0951   MCH 37.0* 02/12/2013 0951   MCHC 34.6 02/12/2013 0951   RDW 18.6* 02/12/2013 0951   LYMPHSABS 0.9 02/12/2013 0951   MONOABS 1.2* 02/12/2013 0951   EOSABS 0.0 02/12/2013 0951   BASOSABS 0.0 02/12/2013 0951      Chemistry      Component Value Date/Time   NA 139 02/12/2013 0951   K 2.9* 02/12/2013 0951   CL 94* 02/12/2013 0951   CO2 30 02/12/2013 0951   BUN 15 02/12/2013 0951   CREATININE 1.19* 02/12/2013 0951      Component Value Date/Time   CALCIUM 7.6* 02/12/2013 0951   ALKPHOS 55 02/12/2013 0951   AST 15 02/12/2013 0951   ALT 13 02/12/2013 0951   BILITOT 0.2* 02/12/2013 0951        ASSESSMENT:  1. Breast cancer,  right-sided, advanced, (T4B, N2) with possible metastatic disease to the left breast versus a simultaneous primary. Her cancer was ER negative, PR negative, Ki-67 marker high at 70+ percent, and HER-2 was amplified with a ratio 2.89. She has been treated with carboplatin/docetaxel x 6 cycles (10/23/2012- 02/12/2013) plus Herceptin which will continue for 52 weeks.  Patient Active Problem List   Diagnosis Date Noted  . Neutropenia 01/04/2013  . Anemia 01/04/2013  . Thrombocytopenia, unspecified 01/04/2013  . 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 Nausea with vomiting 01/03/2013  . Hypokalemia 01/02/2013  . Cellulitis of right upper extremity 12/31/2012  . Hypertension 12/31/2012  . Breast cancer 10/12/2012     PLAN:  1. I personally reviewed and went over laboratory results with the patient. 2. Patient seen by radiation therapist, Dr. Thersa Salt. 3. PET scan scheduled for 02/26/2013 4. 2D echocardiogram scheduled for 04/18/13 5. Will add Perjeta to antibody plan with 820 mg/m2 loading dose followed by 420 mg/m2 dosing every 21 days.  Antibody plan updated to reflect changes. 6. Discussed risks, benefits, alternatives, and side effects of therapy 7. Next cycle of therapy due on 03/05/2013 8. Return 3 weeks following next cycle of chemotherapy.    All questions  were answered. The patient knows to call the clinic with any problems, questions or concerns. We can certainly see the patient much sooner if necessary.  Patient and plan discussed with Dr. Erline Hau and he is in agreement with the aforementioned.   Coron Rossano

## 2013-02-15 ENCOUNTER — Encounter (HOSPITAL_BASED_OUTPATIENT_CLINIC_OR_DEPARTMENT_OTHER): Payer: Medicaid Other | Admitting: Oncology

## 2013-02-15 DIAGNOSIS — C50911 Malignant neoplasm of unspecified site of right female breast: Secondary | ICD-10-CM

## 2013-02-15 DIAGNOSIS — C50919 Malignant neoplasm of unspecified site of unspecified female breast: Secondary | ICD-10-CM

## 2013-02-15 NOTE — Patient Instructions (Addendum)
[facility] Discharge Instructions  RECOMMENDATIONS MADE BY THE CONSULTANT AND ANY TEST RESULTS WILL BE SENT TO YOUR REFERRING PHYSICIAN.  EXAM FINDINGS BY THE PHYSICIAN TODAY AND SIGNS OR SYMPTOMS TO REPORT TO CLINIC OR PRIMARY PHYSICIAN:   You are doing good.   Return 2-3 weeks after your next chemo appt ---- See Dr. Mariel Sleet in July   Thank you for choosing [facility] to provide your oncology and hematology care.  To afford each patient quality time with our providers, please arrive at least 15 minutes before your scheduled appointment time.  With your help, our goal is to use those 15 minutes to complete the necessary work-up to ensure our physicians have the information they need to help with your evaluation and healthcare recommendations.    Effective January 1st, 2014, we ask that you re-schedule your appointment with our physicians should you arrive 10 or more minutes late for your appointment.  We strive to give you quality time with our providers, and arriving late affects you and other patients whose appointments are after yours.    Again, thank you for choosing [facility].  Our hope is that these requests will decrease the amount of time that you wait before being seen by our physicians.       _____________________________________________________________  Should you have questions after your visit to [facility], please contact our office at [phone] between the hours of 8:30 a.m. and 5:00 p.m.  Voicemails left after 4:30 p.m. will not be returned until the following business day.  For prescription refill requests, have your pharmacy contact our office with your prescription refill request.

## 2013-02-20 NOTE — Patient Instructions (Addendum)
[facility]   CHEMOTHERAPY INSTRUCTIONS  Erika Nunez is a HER2-targeted therapy that is designed to search for and attack HER2. This may, in both direct and indirect ways, prevent HER2-overexpressing breast cancer cells from growing. PERJETA works with Herceptin, another HER2-targeted therapy, to fight cancer cells that have too much HER2. Healthy cells also have HER2, and can be affected by HER2-targeted therapies, which may cause serious side effects.  Perjeta can cause reduced heart function or congestive heart failure. We will routinely perform 2D Echoes of your heart to make sure your heart muscle is not weakening.   Most common side effects: feeling tired, nausea, vomiting, low white blood cell count, muscle aches   EDUCATIONAL MATERIALS GIVEN AND REVIEWED: Specific Instructions Sheets on Perjeta   SELF CARE ACTIVITIES WHILE ON CHEMOTHERAPY: Increase your fluid intake 48 hours prior to treatment and drink at least 2 quarts per day after treatment., No alcohol intake., No aspirin or other medications unless approved by your oncologist., Eat foods that are light and easy to digest., Eat foods at cold or room temperature., No fried, fatty, or spicy foods immediately before or after treatment., Have teeth cleaned professionally before starting treatment. Keep dentures and partial plates clean., Use soft toothbrush and do not use mouthwashes that contain alcohol. Biotene is a good mouthwash that is available at most pharmacies or may be ordered by calling [phone]., Use warm salt water gargles (1 teaspoon salt per 1 quart warm water) before and after meals and at bedtime. Or you may rinse with 2 tablespoons of three -percent hydrogen peroxide mixed in eight ounces of water., Always use sunscreen with SPF (Sun Protection Factor) of 30 or higher., Use your nausea medication as directed to prevent nausea., Use your stool softener or laxative as directed to prevent constipation. and Use  your anti-diarrheal medication as directed to stop diarrhea.  Please wash your hands for at least 30 seconds using warm soapy water. Handwashing is the #1 way to prevent the spread of germs. Stay away from sick people or people who are getting over a cold. If you develop respiratory systems such as green/yellow mucus production or productive cough or persistent cough let us know and we will see if you need an antibiotic. It is a good idea to keep a pair of gloves on when going into grocery stores/[employer] to decrease your risk of coming into contact with germs on the carts, etc. Carry alcohol hand gel with you at all times and use it frequently if out in public. All foods need to be cooked thoroughly. No raw foods. No medium or undercooked meats, eggs. If your food is cooked medium well, it does not need to be hot pink or saturated with bloody liquid at all. Vegetables and fruits need to be washed/rinsed under the faucet with a dish detergent before being consumed. You can eat raw fruits and vegetables unless we tell you otherwise but it would be best if you cooked them or bought frozen. Do not eat off of salad bars or hot bars unless you really trust the cleanliness of the restaurant. If you need dental work, please let Dr. Mariel Sleet know before you go for your appointment so that we can coordinate the best possible time for you in regards to your chemo regimen. You need to also let your dentist know that you are actively taking chemo. We may need to do labs prior to your dental appointment. We also want your bowels moving at least every  other day. If this is not happening, we need to know so that we can get you on a bowel regimen to help you go.    SYMPTOMS TO REPORT AS SOON AS POSSIBLE AFTER TREATMENT:  FEVER GREATER THAN 100.5 F  CHILLS WITH OR WITHOUT FEVER  NAUSEA AND VOMITING THAT IS NOT CONTROLLED WITH YOUR NAUSEA MEDICATION  UNUSUAL SHORTNESS OF BREATH  UNUSUAL BRUISING OR  BLEEDING  TENDERNESS IN MOUTH AND THROAT WITH OR WITHOUT PRESENCE OF ULCERS  URINARY PROBLEMS  BOWEL PROBLEMS  UNUSUAL RASH    Wear comfortable clothing and clothing appropriate for easy access to any Portacath or PICC line. Let us know if there is anything that we can do to make your therapy better!      I have been informed and understand all of the instructions given to me and have received a copy. I have been instructed to call the clinic [phone]  or my family physician as soon as possible for continued medical care, if indicated. I do not have any more questions at this time but understand that I may call the Cancer Center at [phone] during office hours should I have questions or need assistance in obtaining follow-up care.      _________________________________________      _______________     __________ Signature of Patient or Authorized Representative        Date                            Time      _________________________________________ Nurse's Signature

## 2013-02-21 ENCOUNTER — Encounter (HOSPITAL_BASED_OUTPATIENT_CLINIC_OR_DEPARTMENT_OTHER): Payer: Medicaid Other

## 2013-02-21 DIAGNOSIS — E876 Hypokalemia: Secondary | ICD-10-CM

## 2013-02-21 DIAGNOSIS — C50919 Malignant neoplasm of unspecified site of unspecified female breast: Secondary | ICD-10-CM

## 2013-02-21 LAB — BASIC METABOLIC PANEL
BUN: 8 mg/dL (ref 6–23)
Chloride: 98 mEq/L (ref 96–112)
Creatinine, Ser: 0.88 mg/dL (ref 0.50–1.10)
GFR calc Af Amer: 80 mL/min — ABNORMAL LOW (ref >90)
Glucose, Bld: 101 mg/dL — ABNORMAL HIGH (ref 70–99)
Potassium: 3.5 mEq/L (ref 3.5–5.1)

## 2013-02-21 NOTE — Progress Notes (Signed)
Labs drawn today for bmp 

## 2013-02-26 ENCOUNTER — Encounter (HOSPITAL_COMMUNITY): Payer: Medicaid Other

## 2013-02-27 ENCOUNTER — Encounter (HOSPITAL_COMMUNITY): Payer: Medicaid Other

## 2013-03-05 ENCOUNTER — Encounter (HOSPITAL_BASED_OUTPATIENT_CLINIC_OR_DEPARTMENT_OTHER): Payer: Medicaid Other

## 2013-03-05 ENCOUNTER — Encounter (HOSPITAL_COMMUNITY): Payer: Medicaid Other

## 2013-03-05 VITALS — BP 108/47 | HR 101 | Temp 98.2°F | Resp 18 | Wt 193.8 lb

## 2013-03-05 DIAGNOSIS — C773 Secondary and unspecified malignant neoplasm of axilla and upper limb lymph nodes: Secondary | ICD-10-CM

## 2013-03-05 DIAGNOSIS — Z5112 Encounter for antineoplastic immunotherapy: Secondary | ICD-10-CM

## 2013-03-05 DIAGNOSIS — C50911 Malignant neoplasm of unspecified site of right female breast: Secondary | ICD-10-CM

## 2013-03-05 DIAGNOSIS — C50919 Malignant neoplasm of unspecified site of unspecified female breast: Secondary | ICD-10-CM

## 2013-03-05 LAB — COMPREHENSIVE METABOLIC PANEL
ALT: 8 U/L (ref 0–35)
AST: 12 U/L (ref 0–37)
CO2: 27 mEq/L (ref 19–32)
Calcium: 7.4 mg/dL — ABNORMAL LOW (ref 8.4–10.5)
Creatinine, Ser: 1.01 mg/dL (ref 0.50–1.10)
GFR calc Af Amer: 68 mL/min — ABNORMAL LOW (ref >90)
GFR calc non Af Amer: 58 mL/min — ABNORMAL LOW (ref >90)
Glucose, Bld: 85 mg/dL (ref 70–99)
Sodium: 140 mEq/L (ref 135–145)
Total Protein: 6.3 g/dL (ref 6.0–8.3)

## 2013-03-05 LAB — CBC
MCH: 37.2 pg — ABNORMAL HIGH (ref 26.0–34.0)
MCHC: 33.8 g/dL (ref 30.0–36.0)
MCV: 110.1 fL — ABNORMAL HIGH (ref 78.0–100.0)
Platelets: 163 10*3/uL (ref 150–400)

## 2013-03-05 LAB — DIFFERENTIAL
Basophils Absolute: 0 10*3/uL (ref 0.0–0.1)
Eosinophils Relative: 0 % (ref 0–5)
Lymphocytes Relative: 22 % (ref 12–46)
Lymphs Abs: 1.5 10*3/uL (ref 0.7–4.0)
Neutro Abs: 4.3 10*3/uL (ref 1.7–7.7)

## 2013-03-05 MED ORDER — HEPARIN SOD (PORK) LOCK FLUSH 100 UNIT/ML IV SOLN
500.0000 [IU] | Freq: Once | INTRAVENOUS | Status: AC | PRN
  Administered 2013-03-05: 500 [IU]
  Filled 2013-03-05: qty 5

## 2013-03-05 MED ORDER — HEPARIN SOD (PORK) LOCK FLUSH 100 UNIT/ML IV SOLN
INTRAVENOUS | Status: AC
  Filled 2013-03-05: qty 5

## 2013-03-05 MED ORDER — SODIUM CHLORIDE 0.9 % IV SOLN
840.0000 mg | Freq: Once | INTRAVENOUS | Status: AC
  Administered 2013-03-05: 840 mg via INTRAVENOUS
  Filled 2013-03-05: qty 28

## 2013-03-05 MED ORDER — DIPHENHYDRAMINE HCL 25 MG PO CAPS
ORAL_CAPSULE | ORAL | Status: AC
  Filled 2013-03-05: qty 2

## 2013-03-05 MED ORDER — ACETAMINOPHEN 325 MG PO TABS
650.0000 mg | ORAL_TABLET | Freq: Once | ORAL | Status: AC
  Administered 2013-03-05: 650 mg via ORAL

## 2013-03-05 MED ORDER — TRASTUZUMAB CHEMO INJECTION 440 MG
6.0000 mg/kg | Freq: Once | INTRAVENOUS | Status: AC
  Administered 2013-03-05: 546 mg via INTRAVENOUS
  Filled 2013-03-05: qty 26

## 2013-03-05 MED ORDER — DIPHENHYDRAMINE HCL 25 MG PO CAPS
50.0000 mg | ORAL_CAPSULE | Freq: Once | ORAL | Status: AC
  Administered 2013-03-05: 50 mg via ORAL

## 2013-03-05 MED ORDER — ACETAMINOPHEN 325 MG PO TABS
ORAL_TABLET | ORAL | Status: AC
  Filled 2013-03-05: qty 2

## 2013-03-05 MED ORDER — SODIUM CHLORIDE 0.9 % IV SOLN
Freq: Once | INTRAVENOUS | Status: AC
  Administered 2013-03-05: 250 mL via INTRAVENOUS

## 2013-03-05 MED ORDER — SODIUM CHLORIDE 0.9 % IJ SOLN
10.0000 mL | INTRAMUSCULAR | Status: DC | PRN
  Filled 2013-03-05: qty 10

## 2013-03-05 NOTE — Progress Notes (Signed)
Echo August 6 or 8

## 2013-03-05 NOTE — Progress Notes (Signed)
Chemo teaching done and consent signed for Perjeta.

## 2013-03-05 NOTE — Progress Notes (Signed)
Erika Nunez continues with right arm swelling - states she has had an URI and had not felt like going to the lymphedema clinic that we have referred her to.  Advised pt of importance of attending to her lymphedema and importance of getting fitted for a sleeve to her right arm.  [name] verbalized understanding again, and states she will be more proactive in following up.  Chemo teaching also done by Tomasita Morrow, RN for Perjeta.  Consent signed.  [name] also tolerated her chemo infusions well and without incident.  Discharged home with her daughter.

## 2013-03-06 ENCOUNTER — Other Ambulatory Visit (HOSPITAL_COMMUNITY): Payer: Self-pay | Admitting: Oncology

## 2013-03-06 DIAGNOSIS — C50911 Malignant neoplasm of unspecified site of right female breast: Secondary | ICD-10-CM

## 2013-03-06 LAB — MAGNESIUM: Magnesium: 0.7 mg/dL — CL (ref 1.5–2.5)

## 2013-03-06 MED ORDER — MAGNESIUM OXIDE 400 (241.3 MG) MG PO TABS: 400.0000 mg | ORAL_TABLET | Freq: Three times a day (TID) | ORAL | Status: DC

## 2013-03-06 NOTE — Progress Notes (Signed)
CRITICAL VALUE ALERT  Critical value received:  Magnesium 0.7  Date of notification:  03/06/13  Time of notification:  1040  Critical value read back:yes  Nurse who received alert:  Tomasita Morrow RN  Jenita Seashore PA-C notified verbally

## 2013-03-07 ENCOUNTER — Telehealth (HOSPITAL_COMMUNITY): Payer: Self-pay | Admitting: Oncology

## 2013-03-07 NOTE — Telephone Encounter (Signed)
Spoke with Erika Nunez on 03/06/13 regarding labs and new rx's.  She also stated that she tolerated chemo well.

## 2013-03-08 ENCOUNTER — Encounter: Payer: Medicaid Other | Admitting: Genetic Counselor

## 2013-03-08 ENCOUNTER — Other Ambulatory Visit: Payer: Medicaid Other | Admitting: Lab

## 2013-03-09 ENCOUNTER — Telehealth (HOSPITAL_COMMUNITY): Payer: Self-pay

## 2013-03-09 NOTE — Telephone Encounter (Signed)
Unable to reach personally.  Msg left to call clinic is any problems, questions, or concerns.

## 2013-03-09 NOTE — Telephone Encounter (Signed)
Message left requesting call back regarding missed appointment with Genetic Counseling.

## 2013-03-09 NOTE — Telephone Encounter (Signed)
Message copied by Evelena Leyden on Fri Mar 09, 2013  2:45 PM ------      Message from: Mariel Sleet, ERIC S      Created: Fri Mar 09, 2013  8:40 AM      Regarding: FW: No show       Will you find out why and re-schedule if she is inclined      ----- Message -----         From: Linard Millers, Yong Channel         Sent: 03/08/2013   9:51 AM           To: Randall An, MD      Subject: No show                                                  Hi Dr. Mariel Sleet,            I just wanted you to know that Ms. Maggart did not keep her appointment in genetics today.            Clydie Braun       ------

## 2013-03-13 ENCOUNTER — Encounter (HOSPITAL_COMMUNITY)
Admission: RE | Admit: 2013-03-13 | Discharge: 2013-03-13 | Disposition: A | Discharge status: Home / Self Care | Payer: Medicaid Other | Source: Ambulatory Visit | Attending: Oncology | Admitting: Oncology

## 2013-03-13 DIAGNOSIS — C50911 Malignant neoplasm of unspecified site of right female breast: Secondary | ICD-10-CM

## 2013-03-13 DIAGNOSIS — C50919 Malignant neoplasm of unspecified site of unspecified female breast: Secondary | ICD-10-CM | POA: Insufficient documentation

## 2013-03-13 LAB — GLUCOSE, CAPILLARY: Glucose-Capillary: 92 mg/dL (ref 70–99)

## 2013-03-13 MED ORDER — FLUDEOXYGLUCOSE F - 18 (FDG) INJECTION
17.9000 | Freq: Once | INTRAVENOUS | Status: AC | PRN
  Administered 2013-03-13: 17.9 via INTRAVENOUS

## 2013-03-14 ENCOUNTER — Encounter (HOSPITAL_COMMUNITY): Payer: Medicaid Other | Attending: Oncology

## 2013-03-14 DIAGNOSIS — C50919 Malignant neoplasm of unspecified site of unspecified female breast: Secondary | ICD-10-CM | POA: Insufficient documentation

## 2013-03-14 LAB — MAGNESIUM: Magnesium: 1.1 mg/dL — ABNORMAL LOW (ref 1.5–2.5)

## 2013-03-14 NOTE — Progress Notes (Signed)
Labs drawn today for mg

## 2013-03-26 ENCOUNTER — Encounter (HOSPITAL_BASED_OUTPATIENT_CLINIC_OR_DEPARTMENT_OTHER): Payer: Medicaid Other

## 2013-03-26 ENCOUNTER — Other Ambulatory Visit (HOSPITAL_COMMUNITY): Payer: Self-pay | Admitting: Oncology

## 2013-03-26 ENCOUNTER — Encounter (HOSPITAL_COMMUNITY): Payer: Self-pay

## 2013-03-26 ENCOUNTER — Telehealth (HOSPITAL_COMMUNITY): Payer: Self-pay | Admitting: *Deleted

## 2013-03-26 VITALS — BP 130/61 | HR 85 | Temp 97.6°F | Resp 18 | Wt 190.0 lb

## 2013-03-26 DIAGNOSIS — C773 Secondary and unspecified malignant neoplasm of axilla and upper limb lymph nodes: Secondary | ICD-10-CM

## 2013-03-26 DIAGNOSIS — C50919 Malignant neoplasm of unspecified site of unspecified female breast: Secondary | ICD-10-CM

## 2013-03-26 DIAGNOSIS — E876 Hypokalemia: Secondary | ICD-10-CM

## 2013-03-26 DIAGNOSIS — Z5112 Encounter for antineoplastic immunotherapy: Secondary | ICD-10-CM

## 2013-03-26 DIAGNOSIS — C50911 Malignant neoplasm of unspecified site of right female breast: Secondary | ICD-10-CM

## 2013-03-26 LAB — CBC WITH DIFFERENTIAL/PLATELET
Basophils Relative: 0 % (ref 0–1)
Hemoglobin: 11.9 g/dL — ABNORMAL LOW (ref 12.0–15.0)
Lymphocytes Relative: 24 % (ref 12–46)
Lymphs Abs: 1.8 10*3/uL (ref 0.7–4.0)
MCHC: 33.8 g/dL (ref 30.0–36.0)
Monocytes Relative: 14 % — ABNORMAL HIGH (ref 3–12)
Neutro Abs: 4.6 10*3/uL (ref 1.7–7.7)
Neutrophils Relative %: 61 % (ref 43–77)
RBC: 3.27 MIL/uL — ABNORMAL LOW (ref 3.87–5.11)
WBC: 7.6 10*3/uL (ref 4.0–10.5)

## 2013-03-26 LAB — COMPREHENSIVE METABOLIC PANEL
Albumin: 3.8 g/dL (ref 3.5–5.2)
Alkaline Phosphatase: 62 U/L (ref 39–117)
BUN: 15 mg/dL (ref 6–23)
Chloride: 100 mEq/L (ref 96–112)
Potassium: 3.1 mEq/L — ABNORMAL LOW (ref 3.5–5.1)
Total Bilirubin: 0.3 mg/dL (ref 0.3–1.2)

## 2013-03-26 MED ORDER — SODIUM CHLORIDE 0.9 % IV SOLN
420.0000 mg | Freq: Once | INTRAVENOUS | Status: AC
  Administered 2013-03-26: 420 mg via INTRAVENOUS
  Filled 2013-03-26: qty 14

## 2013-03-26 MED ORDER — POTASSIUM CHLORIDE CRYS ER 20 MEQ PO TBCR: 40.0000 meq | EXTENDED_RELEASE_TABLET | Freq: Four times a day (QID) | ORAL | Status: DC

## 2013-03-26 MED ORDER — TRASTUZUMAB CHEMO INJECTION 440 MG
6.0000 mg/kg | Freq: Once | INTRAVENOUS | Status: AC
  Administered 2013-03-26: 546 mg via INTRAVENOUS
  Filled 2013-03-26: qty 26

## 2013-03-26 MED ORDER — SODIUM CHLORIDE 0.9 % IJ SOLN
10.0000 mL | INTRAMUSCULAR | Status: DC | PRN
  Administered 2013-03-26: 10 mL
  Filled 2013-03-26: qty 10

## 2013-03-26 MED ORDER — MAGNESIUM OXIDE 400 (241.3 MG) MG PO TABS: 400.0000 mg | ORAL_TABLET | Freq: Three times a day (TID) | ORAL | Status: DC

## 2013-03-26 MED ORDER — DIPHENHYDRAMINE HCL 25 MG PO CAPS
50.0000 mg | ORAL_CAPSULE | Freq: Once | ORAL | Status: AC
  Administered 2013-03-26: 50 mg via ORAL

## 2013-03-26 MED ORDER — HEPARIN SOD (PORK) LOCK FLUSH 100 UNIT/ML IV SOLN
500.0000 [IU] | Freq: Once | INTRAVENOUS | Status: AC | PRN
  Administered 2013-03-26: 500 [IU]
  Filled 2013-03-26: qty 5

## 2013-03-26 MED ORDER — HEPARIN SOD (PORK) LOCK FLUSH 100 UNIT/ML IV SOLN
INTRAVENOUS | Status: AC
  Filled 2013-03-26: qty 5

## 2013-03-26 MED ORDER — ACETAMINOPHEN 325 MG PO TABS
ORAL_TABLET | ORAL | Status: AC
  Filled 2013-03-26: qty 1

## 2013-03-26 MED ORDER — SODIUM CHLORIDE 0.9 % IV SOLN: Freq: Once | INTRAVENOUS | Status: DC

## 2013-03-26 MED ORDER — ACETAMINOPHEN 325 MG PO TABS
650.0000 mg | ORAL_TABLET | Freq: Once | ORAL | Status: AC
  Administered 2013-03-26: 650 mg via ORAL

## 2013-03-26 MED ORDER — DIPHENHYDRAMINE HCL 25 MG PO CAPS
ORAL_CAPSULE | ORAL | Status: AC
  Filled 2013-03-26: qty 2

## 2013-03-26 NOTE — Progress Notes (Signed)
Tolerated well

## 2013-03-26 NOTE — Patient Instructions (Addendum)
Erika Nunez Discharge Instructions  RECOMMENDATIONS MADE BY THE CONSULTANT AND ANY TEST RESULTS WILL BE SENT TO YOUR REFERRING PHYSICIAN.  EXAM FINDINGS BY THE PHYSICIAN TODAY AND SIGNS OR SYMPTOMS TO REPORT TO CLINIC OR PRIMARY PHYSICIAN: Exam and discussion by Dr. Sharia Reeve.  We will get a consult with your surgeon and radiation oncologist for re-evaluation based upon the great response shown on your PET scan. If they don't want to do anything differently we will continue chemotherapy. We will also make a referral for lympaedema clinic as well.  MEDICATIONS PRESCRIBED:  none  INSTRUCTIONS GIVEN AND DISCUSSED: Report uncontrolled nausea, vomiting or other problems.  SPECIAL INSTRUCTIONS/FOLLOW-UP: Referral to Dr. Leticia Penna, Dr Thersa Salt and PT for lymphaedema.  Follow-up here in 3 weeks.   Thank you for choosing [name] to provide your oncology and hematology care.  To afford each patient quality time with our providers, please arrive at least 15 minutes before your scheduled appointment time.  With your help, our goal is to use those 15 minutes to complete the necessary work-up to ensure our physicians have the information they need to help with your evaluation and healthcare recommendations.    Effective January 1st, 2014, we ask that you re-schedule your appointment with our physicians should you arrive 10 or more minutes late for your appointment.  We strive to give you quality time with our providers, and arriving late affects you and other patients whose appointments are after yours.    Again, thank you for choosing [facility].  Our hope is that these requests will decrease the amount of time that you wait before being seen by our physicians.       _____________________________________________________________  Should you have questions after your visit to [facility], please contact our office at [phone] between the  hours of 8:30 a.m. and 5:00 p.m.  Voicemails left after 4:30 p.m. will not be returned until the following business day.  For prescription refill requests, have your pharmacy contact our office with your prescription refill request.

## 2013-03-26 NOTE — Telephone Encounter (Signed)
Erika Nunez has appointments with Dr. Tilford Pillar on 03/27/13 at 10:45 AM and with Dr.Palmero at [facility] on 04/03/2013 at 1:30 PM and I sent the order for the lymphedema Clinic and they will call the patient with that appointment.Marland KitchenMarland KitchenMarland Kitchen

## 2013-03-26 NOTE — Progress Notes (Signed)
[facility] [phone]   [phone]  OFFICE PROGRESS NOTE  Randall An, MD 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  ONCOLOGIC HISTORY: She was diagnosed with breast cancer on 10/02/12. She had excisional biopsy of the left breast and 10/20/2012.She has been treated with carboplatin/docetaxel x 6 cycles (10/23/2012- 02/12/2013) plus Herceptin which will continue for 52 weeks. She continues on 3 weekly Herceptin and after her last received decision was made to add  Pertuzumab to the regimen.  As documented in her last visit 02/15/2013;Herceptin was recently held due to development of edema. 2D echo was performed on 01/24/2013 and EF was noted to be stable at 60-65%. As a result, Herceptin was re-initiated.    INTERVAL HISTORY:   Rmoni Keplinger Daloia 62 y.o. female returns to the clinic today  Scheduled follow up accompanied by her the Jasmine December.  Patient tells me she is feeling well except for my nausea after her last infusion she has not had any recent problem with targeted therapy or chemotherapy.she tells me she is eating okay.  Denies any shortness of breath.  She continues to have right upper extremity swelling no tenderness.  There is also associated skin thickening. She was treated in in April for  Cellulitis involving this arm.  Reportedly she has seen Dr. Leticia Penna and radiation oncology in the past and was considered not a candidate for local therapy.  She has a recent PET/CT scan result has been noted are compared with the one done at the beginning. Personally compared the images and showed it to the patient. There has been a remarkable improvement.   MEDICAL HISTORY: Past Medical History  Diagnosis Date  . Hypertension   . High cholesterol   . Cancer   . Breast cancer   . Allergy     seasonal allergies  . Arthritis   . Anxiety   . Breast mass, left 2014  . Breast  mass, right 2014  . 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  MEDICATIONS:  Current Outpatient Prescriptions  Medication Sig Dispense Refill  . HYDROcodone-acetaminophen (NORCO/VICODIN) 5-325 MG per tablet Take 1-2 tablets by mouth every 4 (four) hours as needed for pain.  60 tablet  0  . ibuprofen (ADVIL,MOTRIN) 200 MG tablet Take 200 mg by mouth every 6 (six) hours as needed. Pain.      . lidocaine-prilocaine (EMLA) cream Apply topically once. Apply a quarter sized amount to port site 1 hour prior to chemo. Do not rub in. Cover with plastic.      Marland Kitchen lisinopril (PRINIVIL,ZESTRIL) 20 MG tablet Take 20 mg by mouth daily.      Marland Kitchen lisinopril-hydrochlorothiazide (PRINZIDE,ZESTORETIC) 20-25 MG per tablet Take 1 tablet by mouth daily.      Marland Kitchen LORazepam (ATIVAN) 1 MG tablet Take 1 tablet (1 mg total) by mouth every 4 (four) hours as needed. For nausea/vomiting. Do not drive while taking this medication. This medication may make you drowsy or sleepy. Change positions slowly.  30 tablet  1  . magnesium oxide (MAG-OX) 400 (241.3 MG) MG tablet Take 1 tablet (400 mg total) by mouth 3 (three) times daily.  90 tablet  0  . ondansetron (ZOFRAN) 8 MG tablet Take 8 mg by mouth. Starting the day after chemo, take 1 tablet in the am and 1 tablet in the pm for 2 days. Then may take  1 tablet two times a day IF needed for nausea/vomiting.      . polyethylene glycol (MIRALAX / GLYCOLAX) packet Take 17 g by mouth daily as needed.  14 each    . potassium chloride SA (K-DUR,KLOR-CON) 20 MEQ tablet Take 40 mEq by mouth 3 (three) times daily.      . pravastatin (PRAVACHOL) 80 MG tablet Take 80 mg by mouth at bedtime.      . sertraline (ZOLOFT) 100 MG tablet Take 150 mg by mouth daily.      . Trastuzumab (HERCEPTIN IV) Inject into the vein every 21 ( twenty-one) days.      . prochlorperazine (COMPAZINE) 10 MG tablet Take 1 tablet (10 mg total) by mouth every 6 (six)  hours as needed.  30 tablet  1   No current facility-administered medications for this visit.   Facility-Administered Medications Ordered in Other Visits  Medication Dose Route Frequency Provider Last Rate Last Dose  . 0.9 %  sodium chloride infusion   Intravenous Once Randall An, MD      . heparin lock flush 100 unit/mL  500 Units Intracatheter Once PRN Randall An, MD      . sodium chloride 0.9 % injection 10 mL  10 mL Intracatheter PRN Randall An, MD      . trastuzumab (HERCEPTIN) 546 mg in sodium chloride 0.9 % 250 mL chemo infusion  6 mg/kg (Treatment Plan Actual) Intravenous Once Randall An, MD        SURGICAL HISTORY:  Past Surgical History  Procedure Laterality Date  . Cholecystectomy    . Tubal ligation    . Tonsillectomy    . Breast biopsy  10/02/2012    Procedure: BREAST BIOPSY;  Surgeon: Fabio Bering, MD;  Location: [facility];  Service: General;  Laterality: Right;  . Portacath placement Left 10/20/2012  . Breast biopsy Left 10/20/2012    Procedure: BREAST BIOPSY;  Surgeon: Fabio Bering, MD;  Location: [facility];  Service: General;  Laterality: Left;  Procedure end 1142  . Portacath placement Left 10/20/2012    Procedure: INSERTION PORT-A-CATH;  Surgeon: Fabio Bering, MD;  Location: [facility];  Service: General;  Laterality: Left;  Procedure began 1143; left subclavian     REVIEW OF SYSTEMS:14 point review of system is as in the history above otherwise negative.   PHYSICAL EXAMINATION:  There were no vitals taken for this visit. GENERAL: No distress, well nourished.  SKIN:  No rashes or significant lesions .  Alopecia noted. HEAD: Normocephalic, No masses, lesions, tenderness or abnormalities  EYES: Conjunctiva are pink and non-injected  ENT: External ears normal ,lips, buccal mucosa, and tongue normal and mucous membranes are moist  LYMPH: No palpable lymphadenopathy,   BREAST:patient has some deformed breast tissue in the right side(smaller than  left) and the nipple -areolar tissue tethered to the chest well . The left breast is the has a deep plan if minimal  discharge lying by granulation tissue. LUNGS: clear to auscultation , no crackles or wheezes HEART: regular rate & rhythm, no murmurs, no gallops, S1 normal and S2 normal   ABDOMEN: Abdomen soft, non-tender, normal bowel sounds, no masses or organomegaly and no hepatosplenomegaly  MSK: No CVA tenderness and no tenderness on percussion of the back or rib cage.lipomatous tissue at the left upper back. EXTREMITIES: No edema, no skin discoloration or tenderness NEURO: alert & oriented , no focal motor/sensory deficits.     LABORATORY DATA: Lab  Results  Component Value Date   WBC 7.6 03/26/2013   HGB 11.9* 03/26/2013   HCT 35.2* 03/26/2013   MCV 107.6* 03/26/2013   PLT 208 03/26/2013      Chemistry      Component Value Date/Time   NA 138 03/26/2013 1039   K 3.1* 03/26/2013 1039   CL 100 03/26/2013 1039   CO2 28 03/26/2013 1039   BUN 15 03/26/2013 1039   CREATININE 1.22* 03/26/2013 1039      Component Value Date/Time   CALCIUM 9.2 03/26/2013 1039   ALKPHOS 62 03/26/2013 1039   AST 13 03/26/2013 1039   ALT 9 03/26/2013 1039   BILITOT 0.3 03/26/2013 1039       RADIOGRAPHIC STUDIES: Nm Pet Image Restage (ps) Skull Base To Thigh  03/13/2013   *RADIOLOGY REPORT*  Clinical Data: Subsequent treatment strategy for breast cancer.  NUCLEAR MEDICINE PET SKULL BASE TO THIGH  Fasting Blood Glucose:  92  Technique:  17.9 mCi F-18 FDG was injected intravenously. CT data was obtained and used for attenuation correction and anatomic localization only.  (This was not acquired as a diagnostic CT examination.) Additional exam technical data entered on technologist worksheet.  Comparison:  10/12/2012  Findings:  Neck: No hypermetabolic lymph nodes in the neck.  Chest:  The right breast mass measures 6.2 cm and has an SUV max equal to 6.5, image 79.  Previously the right breast mass measured 11.9 cm  and had an SUV max equal to 14.9. There has been resolution of the tumor component invading the ventral chest wall.   There has been resolution of the previous hypermetabolic right axillary lymph nodes.  The right retropectoral lymph nodes are also resolved in the interval.  Hypermetabolic left breast tumor measures 1.6 x 5.5 cm and has an SUV max equal to 10.6.  Gas within this area is identified on the corresponding CT images and may be due to instrumentation or necrosis/infection.  On the previous exam tumor in the left breast measured 1.8 x 3.8 cm and had an SUV max equal to 12.8.  No hypermetabolic mediastinal or hilar lymph nodes.  No hypermetabolic pulmonary nodule or mass identified.  Abdomen/Pelvis:  No abnormal hypermetabolic activity within the liver, pancreas, adrenal glands, or spleen.  No hypermetabolic lymph nodes in the abdomen or pelvis.  Skeleton:  No focal hypermetabolic activity to suggest skeletal metastasis.  IMPRESSION:  1.  Interval partial response to therapy. 2.  Significant decrease in size and degree of FDG uptake associated with the right breast mass. 3.  There has been resolution of previous hypermetabolic right axillary and retropectoral lymph nodes. 4.  Decrease in size and FDG uptake associated with the contralateral breast mass.   Original Report Authenticated By: Signa Kell, M.D.     ASSESSMENT:  Patient  Appears to have local regional breast cancer with significant improvement on PET scan.HER-2/neu overexpressed,ER/PR - . But significant improvement in her disease areas. The question of of the  right breast is metastasis or another primary remains but the does not preclude potentially curative treatment. It is not very clear what the extent of disease he is, however there is no clear evidence of widespread metastasis at this time. I want to make sure that we do not miss an opportunity to improve her local control with surgery and possibly radiation. If these options are  not feasible, then palliative intent chemotherapy will be continued. In this regard I think that's re- visiting surgery / radiation  is relevant in the light of new PET scan results.  I did however tell patient that is a good chance that this may not be feasible in which case chemotherapy will be continued indefinitely until progression. 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 Bilateral. Right upper extremity lymphedema.    PLAN:  1. Referral to radiation oncology reconsideration of  local treatment after surgery. 2. Referred to Dr. Gillis Santa for reconsideration of surgery . 3. In the meantime she'll continue Herceptin and Perjeta. 4. Referred to lymphedema clinic.  Return to clinic in 3 weeks.  All questions were satisfactorily answered.She knows to call if she has any concern.  I spent 20 minutes counseling the patient face to face. The total time spent in the appointment was 30 minutes.   Sherral Hammers, MD FACP. Hematology/Oncology.

## 2013-03-26 NOTE — Progress Notes (Signed)
Tolerated infusion well. Ativan refills (2) called in for patient to [facility].

## 2013-03-28 ENCOUNTER — Ambulatory Visit (HOSPITAL_COMMUNITY): Payer: Medicaid Other | Admitting: Oncology

## 2013-04-10 ENCOUNTER — Ambulatory Visit (HOSPITAL_COMMUNITY)
Admission: RE | Admit: 2013-04-10 | Discharge: 2013-04-10 | Disposition: A | Discharge status: Home / Self Care | Payer: Medicaid Other | Source: Ambulatory Visit | Attending: General Surgery | Admitting: General Surgery

## 2013-04-10 ENCOUNTER — Encounter (HOSPITAL_COMMUNITY)
Admission: RE | Admit: 2013-04-10 | Discharge: 2013-04-10 | Disposition: A | Discharge status: Home / Self Care | Payer: Medicaid Other | Source: Ambulatory Visit | Attending: General Surgery | Admitting: General Surgery

## 2013-04-10 ENCOUNTER — Encounter (HOSPITAL_COMMUNITY): Payer: Self-pay

## 2013-04-10 ENCOUNTER — Encounter (HOSPITAL_COMMUNITY): Payer: Self-pay | Admitting: Pharmacy Technician

## 2013-04-10 DIAGNOSIS — C50919 Malignant neoplasm of unspecified site of unspecified female breast: Secondary | ICD-10-CM | POA: Insufficient documentation

## 2013-04-10 HISTORY — DX: Major depressive disorder, single episode, unspecified: F32.9

## 2013-04-10 HISTORY — DX: Depression, unspecified: F32.A

## 2013-04-10 HISTORY — DX: Lymphedema, not elsewhere classified: I89.0

## 2013-04-10 LAB — BASIC METABOLIC PANEL
BUN: 10 mg/dL (ref 6–23)
Calcium: 9.5 mg/dL (ref 8.4–10.5)
GFR calc non Af Amer: 50 mL/min — ABNORMAL LOW (ref >90)
Glucose, Bld: 89 mg/dL (ref 70–99)
Sodium: 140 mEq/L (ref 135–145)

## 2013-04-10 LAB — CBC WITH DIFFERENTIAL/PLATELET
Basophils Absolute: 0 K/uL (ref 0.0–0.1)
Basophils Relative: 0 % (ref 0–1)
Eosinophils Absolute: 0.3 K/uL (ref 0.0–0.7)
Eosinophils Relative: 3 % (ref 0–5)
HCT: 39.1 % (ref 36.0–46.0)
Hemoglobin: 13.4 g/dL (ref 12.0–15.0)
Lymphocytes Relative: 24 % (ref 12–46)
Lymphs Abs: 2.1 K/uL (ref 0.7–4.0)
MCH: 35.8 pg — ABNORMAL HIGH (ref 26.0–34.0)
MCHC: 34.3 g/dL (ref 30.0–36.0)
MCV: 104.5 fL — ABNORMAL HIGH (ref 78.0–100.0)
Monocytes Absolute: 0.9 K/uL (ref 0.1–1.0)
Monocytes Relative: 10 % (ref 3–12)
Neutro Abs: 5.6 K/uL (ref 1.7–7.7)
Neutrophils Relative %: 63 % (ref 43–77)
Platelets: 192 K/uL (ref 150–400)
RBC: 3.74 MIL/uL — ABNORMAL LOW (ref 3.87–5.11)
RDW: 12.8 % (ref 11.5–15.5)
WBC: 9 K/uL (ref 4.0–10.5)

## 2013-04-10 NOTE — Patient Instructions (Signed)
Kenzi Bardwell [facility]  04/10/2013   Your procedure is scheduled on:  04/16/13  Report to [facility] at [id number].  Call this number if you have problems the morning of surgery: [phone]   Remember:   Do not eat food or drink liquids after midnight.   Take these medicines the morning of surgery with A SIP OF WATER: norco, lisinopril, ativan, zofran, compazine   Do not wear jewelry, make-up or nail polish.  Do not wear lotions, powders, or perfumes. You may wear deodorant.  Do not shave 48 hours prior to surgery. Men may shave face and neck.  Do not bring valuables to the hospital.  [facility] is not responsible                   for any belongings or valuables.  Contacts, dentures or bridgework may not be worn into surgery.  Leave suitcase in the car. After surgery it may be brought to your room.  For patients admitted to the hospital, checkout time is 11:00 AM the day of  discharge.   Patients discharged the day of surgery will not be allowed to drive  home.  Name and phone number of your driver: family  Special Instructions: Shower using CHG 2 nights before surgery and the night before surgery.  If you shower the day of surgery use CHG.  Use special wash - you have one bottle of CHG for all showers.  You should use approximately 1/3 of the bottle for each shower.   Please read over the following fact sheets that you were given: Pain Booklet, Coughing and Deep Breathing, Surgical Site Infection Prevention, Anesthesia Post-op Instructions and Care and Recovery After Surgery   PATIENT INSTRUCTIONS POST-ANESTHESIA  IMMEDIATELY FOLLOWING SURGERY:  Do not drive or operate machinery for the first twenty four hours after surgery.  Do not make any important decisions for twenty four hours after surgery or while taking narcotic pain medications or sedatives.  If you develop intractable nausea and vomiting or a severe headache please notify your doctor immediately.  FOLLOW-UP:  Please make an  appointment with your surgeon as instructed. You do not need to follow up with anesthesia unless specifically instructed to do so.  WOUND CARE INSTRUCTIONS (if applicable):  Keep a dry clean dressing on the anesthesia/puncture wound site if there is drainage.  Once the wound has quit draining you may leave it open to air.  Generally you should leave the bandage intact for twenty four hours unless there is drainage.  If the epidural site drains for more than 36-48 hours please call the anesthesia department.  QUESTIONS?:  Please feel free to call your physician or the hospital operator if you have any questions, and they will be happy to assist you.      Mastectomy, With or Without Reconstruction Mastectomy (removal of the breast) is a procedure most commonly used to treat cancer (tumor) of the breast. Different procedures are available for treatment. This depends on the stage of the tumor (abnormal growths). Discuss this with your caregiver, surgeon (a specialist for performing operations such as this), or oncologist (someone specialized in the treatment of cancer). With proper information, you can decide which treatment is best for you. Although the sound of the word cancer is frightening to all of [name], the new treatments and medications can be a source of reassurance and comfort. If there are things you are worried about, discuss them with your caregiver. He or she can help comfort  you and your family. Some of the different procedures for treating breast cancer are:  Radical (extensive) mastectomy. This is an operation used to remove the entire breast, the muscles under the breast, and all of the glands (lymph nodes) under the arm. With all of the new treatments available for cancer of the breast, this procedure has become less common.  Modified radical mastectomy. This is a similar operation to the radical mastectomy described above. In the modified radical mastectomy, the muscles of the chest wall are  not removed unless one of the lessor muscles is removed. One of the lessor muscles may be removed to allow better removal of the lymph nodes. The axillary lymph nodes are also removed. Rarely, during an axillary node dissection nerves to this area are damaged. Radiation therapy is then often used to the area following this surgery.  A total mastectomy also known as a complete or simple mastectomy. It involves removal of only the breast. The lymph nodes and the muscles are left in place.  In a lumpectomy, the lump is removed from the breast. This is the simplest form of surgical treatment. A sentinel lymph node biopsy may also be done. Additional treatment may be required. RISKS AND COMPLICATIONS The main problems that follow removal of the breast include:  Infection (germs start growing in the wound). This can usually be treated with antibiotics (medications that kill germs).  Lymphedema. This means the arm on the side of the breast that was operated on swells because the lymph (tissue fluid) cannot follow the main channels back into the body. This only occurs when the lymph nodes have had to be removed under the arm.  There may be some areas of numbness to the upper arm and around the incision (cut by the surgeon) in the breast. This happens because of the cutting of or damage to some of the nerves in the area. This is most often unavoidable.  There may be difficulty moving the arm in a full range of motion (moving in all directions) following surgery. This usually improves with time following use and exercise.  Recurrence of breast cancer may happen with the very best of surgery and follow up treatment. Sometimes small cancer cells that cannot be seen with the naked eye have already spread at the time of surgery. When this happens other treatment is available. This treatment may be radiation, medications or a combination of both. RECONSTRUCTION Reconstruction of the breast may be done immediately  if there is not going to be post-operative radiation. This surgery is done for cosmetic (improve appearance) purposes to improve the physical appearance after the operation. This may be done in two ways:  It can be done using a saline filled prosthetic (an artificial breast which is filled with salt water). Silicone breast implants are now re-approved by the FDA and are being commonly used.  Reconstruction can be done using the body's own muscle/fat/skin. Your caregiver will discuss your options with you. Depending upon your needs or choice, together you will be able to determine which procedure is best for you. Document Released: 05/25/2001 Document Revised: 05/24/2012 Document Reviewed: 01/16/2008 [facility] Patient Information 2014 [place], [place].   Mastectomy Care After HOME CARE    Care for your wound after the bandages are off as told by your doctor.  Put soft padding such as gauze, soft cloth, or a nursing pad over your wound if you wear a bra.  Ask your doctor about groups that can help you with any  emotions you may have after the surgery.  Exercise your arm and shoulder as told by your doctor.  Place your hands on a wall. Use your fingers to "climb the wall." Reach as high as you can until you feel a stretch. When you are not exercising, keep your arm raised (elevated).  When sitting or lying down, put your arm up on pillows or rolled blankets.  Do not use your arm to lift or push anything heavier than 10 pounds (about one gallon of milk ) for the first 6 weeks.  Always take good care of the arm on the side that the breast was removed.  Never let anyone take your blood pressure, draw blood, or give you a shot in that arm.  Do not get even a small cut on that arm or hand. Use a thimble when you sew. Wear heavy gloves when you garden.  Use insect repellent on that arm if outside.  Do not use a razor to shave that underarm. You should use only an electric shaver.  Do not  burn that arm. Use a glove when you reach into the oven. Cover your arm with a towel or wear a long-sleeved shirt when you are out in the sun.  Wear your watch and other jewelry on the other arm.  Wear a loose fitting rubber glove when you wash the dishes. Do not leave your hand in water for a long time, especially when you use detergents.  Do not cut your cuticles or hang nails. Push cuticles back with a towel after you take a bath.  Carry your purse or any heavy objects in the other arm. GET HELP RIGHT AWAY IF:   Your arm becomes very puffy (swollen).  You have redness or pain at the wound site.  There is a bad smell coming from the wound.  Thre is yellowish white fluid (pus) coming from your wound.  You have a fever. MAKE SURE YOU:   Understand these instructions.  Will watch your condition.  Will get help right away if you are not doing well or get worse. Document Released: 06/08/2008 Document Revised: 11/22/2011 Document Reviewed: 06/08/2008 [facility] Patient Information 2014 [place], [place].

## 2013-04-16 ENCOUNTER — Encounter (HOSPITAL_COMMUNITY): Payer: Medicaid Other | Attending: Oncology

## 2013-04-16 ENCOUNTER — Encounter (HOSPITAL_BASED_OUTPATIENT_CLINIC_OR_DEPARTMENT_OTHER): Payer: Medicaid Other

## 2013-04-16 ENCOUNTER — Encounter (HOSPITAL_COMMUNITY): Payer: Self-pay

## 2013-04-16 VITALS — BP 116/64 | HR 84 | Temp 97.7°F | Resp 18 | Wt 190.0 lb

## 2013-04-16 DIAGNOSIS — Z171 Estrogen receptor negative status [ER-]: Secondary | ICD-10-CM

## 2013-04-16 DIAGNOSIS — C50919 Malignant neoplasm of unspecified site of unspecified female breast: Secondary | ICD-10-CM

## 2013-04-16 DIAGNOSIS — C50911 Malignant neoplasm of unspecified site of right female breast: Secondary | ICD-10-CM

## 2013-04-16 DIAGNOSIS — C773 Secondary and unspecified malignant neoplasm of axilla and upper limb lymph nodes: Secondary | ICD-10-CM

## 2013-04-16 DIAGNOSIS — I890 Lymphedema, not elsewhere classified: Secondary | ICD-10-CM

## 2013-04-16 DIAGNOSIS — Z5112 Encounter for antineoplastic immunotherapy: Secondary | ICD-10-CM

## 2013-04-16 LAB — COMPREHENSIVE METABOLIC PANEL
ALT: 10 U/L (ref 0–35)
AST: 13 U/L (ref 0–37)
Alkaline Phosphatase: 61 U/L (ref 39–117)
CO2: 26 mEq/L (ref 19–32)
GFR calc Af Amer: 60 mL/min — ABNORMAL LOW (ref >90)
Glucose, Bld: 84 mg/dL (ref 70–99)
Potassium: 3.8 mEq/L (ref 3.5–5.1)
Sodium: 137 mEq/L (ref 135–145)
Total Protein: 6.6 g/dL (ref 6.0–8.3)

## 2013-04-16 LAB — CBC WITH DIFFERENTIAL/PLATELET
Basophils Absolute: 0 10*3/uL (ref 0.0–0.1)
Eosinophils Absolute: 0.2 10*3/uL (ref 0.0–0.7)
Lymphocytes Relative: 24 % (ref 12–46)
Lymphs Abs: 1.6 10*3/uL (ref 0.7–4.0)
Neutrophils Relative %: 62 % (ref 43–77)
Platelets: 191 10*3/uL (ref 150–400)
RBC: 3.52 MIL/uL — ABNORMAL LOW (ref 3.87–5.11)
WBC: 6.8 10*3/uL (ref 4.0–10.5)

## 2013-04-16 MED ORDER — ACETAMINOPHEN 325 MG PO TABS
650.0000 mg | ORAL_TABLET | Freq: Once | ORAL | Status: AC
  Administered 2013-04-16: 650 mg via ORAL

## 2013-04-16 MED ORDER — HEPARIN SOD (PORK) LOCK FLUSH 100 UNIT/ML IV SOLN
INTRAVENOUS | Status: AC
  Filled 2013-04-16: qty 5

## 2013-04-16 MED ORDER — TRASTUZUMAB CHEMO INJECTION 440 MG
6.0000 mg/kg | Freq: Once | INTRAVENOUS | Status: AC
  Administered 2013-04-16: 546 mg via INTRAVENOUS
  Filled 2013-04-16: qty 26

## 2013-04-16 MED ORDER — SODIUM CHLORIDE 0.9 % IV SOLN
Freq: Once | INTRAVENOUS | Status: AC
  Administered 2013-04-16: 11:00:00 via INTRAVENOUS

## 2013-04-16 MED ORDER — DIPHENHYDRAMINE HCL 25 MG PO CAPS
ORAL_CAPSULE | ORAL | Status: AC
  Filled 2013-04-16: qty 2

## 2013-04-16 MED ORDER — HEPARIN SOD (PORK) LOCK FLUSH 100 UNIT/ML IV SOLN
500.0000 [IU] | Freq: Once | INTRAVENOUS | Status: AC | PRN
  Administered 2013-04-16: 500 [IU]
  Filled 2013-04-16: qty 5

## 2013-04-16 MED ORDER — ACETAMINOPHEN 325 MG PO TABS
ORAL_TABLET | ORAL | Status: AC
  Filled 2013-04-16: qty 2

## 2013-04-16 MED ORDER — SODIUM CHLORIDE 0.9 % IV SOLN
420.0000 mg | Freq: Once | INTRAVENOUS | Status: AC
  Administered 2013-04-16: 420 mg via INTRAVENOUS
  Filled 2013-04-16: qty 14

## 2013-04-16 MED ORDER — DIPHENHYDRAMINE HCL 25 MG PO CAPS
50.0000 mg | ORAL_CAPSULE | Freq: Once | ORAL | Status: AC
  Administered 2013-04-16: 50 mg via ORAL

## 2013-04-16 NOTE — Progress Notes (Signed)
[facility] Telephone:([phone]) [phone]   Fax:([phone]  OFFICE PROGRESS NOTE  Randall An, MD [address] [address]  DIAGNOSIS: Right-sided, advanced, (T4B, N2) with possible metastatic disease to the left breast versus a simultaneous primary. Her cancer was ER negative, PR negative, Ki-67 marker high at 70+ percent, and HER-2 was amplified with a ratio 2.89. She has been treated with carboplatin/docetaxel x 6 cycles (10/23/2012- 02/12/2013) plus Herceptin. Recently Perjeta was added to treatment regimen.   ONCOLOGIC HISTORY: She was diagnosed with breast cancer on 10/02/12. She had excisional biopsy of the left breast and 10/20/2012.She has been treated with carboplatin/docetaxel x 6 cycles (10/23/2012- 02/12/2013) plus Herceptin which will continue for 52 weeks. She continues on 3 weekly Herceptin and after her last received decision was made to add  Pertuzumab to the regimen.  As documented in her last visit 02/15/2013;Herceptin was recently held due to development of edema. 2D echo was performed on 01/24/2013 and EF was noted to be stable at 60-65%. As a result, Herceptin was re-initiated, and recently Perjeta was added to treatment regimen.  INTERVAL HISTORY / HPI:   Erika Nunez 62 y.o. female returns for scheduled oncology evaluation and to receive next dose of treatment for breast cancer, she is currently receiving infusion of Herceptin/Perjeta. Overall states that she is tolerating this treatment well and denies any new side effects. She denies any new dyspnea, orthopnea or PND. She denies any recurrent or progressive swelling of the lower extremities, she has chronic swelling in the right upper extremity which is unchanged. She had a recent PET scan which reported decrease in size of the breast masses and improvement in axillary and retropectoral lymph nodes. She was seen by Dr. Sharia Reeve 3 weeks ago who referred her for surgery evaluation, accordingly  states that she is scheduled to undergo bilateral mastectomy on August 13. Denies any pain issues at this time. No new bone pains. No new headaches, imbalance or falls. No new cough, chest pain or hemoptysis. Eating steady.  MEDICAL HISTORY: Past Medical History  Diagnosis Date  . Hypertension   . High cholesterol   . Cancer   . Breast cancer   . Allergy     seasonal allergies  . Arthritis   . Anxiety   . Breast mass, left 2014  . Breast mass, right 2014  . 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 Depression   . Lymphedema of arm     right arm    ALLERGIES:  is allergic to codeine.  MEDICATIONS:  HYDROcodone-acetaminophen (NORCO/VICODIN) 5-325 MG per tablet 1-2 tablet, Every 4 hours PRN ibuprofen (ADVIL,MOTRIN) 200 MG tablet 200 mg, Every 6 hours PRN lidocaine-prilocaine (EMLA) cream Once lisinopril (PRINIVIL,ZESTRIL) 20 MG tablet 20 mg, Daily lisinopril-hydrochlorothiazide (PRINZIDE,ZESTORETIC) 20-25 MG per tablet 1 tablet, Daily LORazepam (ATIVAN) 1 MG tablet 1 mg, Every 4 hours PRN magnesium oxide (MAG-OX) 400 (241.3 MG) MG tablet 400 mg, 3 times daily ondansetron (ZOFRAN) 8 MG tablet 8 mg pertuzumab (PERJETA) 420 mg in sodium chloride 0.9 % 250 mL chemo infusion 420 mg, Once potassium chloride SA (K-DUR,KLOR-CON) 20 MEQ tablet 40 mEq, 4 times daily pravastatin (PRAVACHOL) 80 MG tablet 80 mg, Daily at bedtime prochlorperazine (COMPAZINE) 10 MG tablet 10 mg, Every 6 hours PRN Trastuzumab (HERCEPTIN IV) Every 21 days  SURGICAL HISTORY:  Past Surgical History  Procedure Laterality Date  . Cholecystectomy    . Tubal ligation    . Tonsillectomy    .  Breast biopsy  10/02/2012    Procedure: BREAST BIOPSY;  Surgeon: Fabio Bering, MD;  Location: [facility];  Service: General;  Laterality: Right;  . Portacath placement Left 10/20/2012  . Breast biopsy Left 10/20/2012    Procedure: BREAST BIOPSY;  Surgeon: Fabio Bering, MD;  Location: [facility];  Service: General;   Laterality: Left;  Procedure end 1142  . Portacath placement Left 10/20/2012    Procedure: INSERTION PORT-A-CATH;  Surgeon: Fabio Bering, MD;  Location: [facility];  Service: General;  Laterality: Left;  Procedure began 1143; left subclavian     REVIEW OF SYSTEMS: As in history of present illness above. In addition, no fevers or night sweats. No new headaches or focal weakness. Denies imbalance, falls or loss of consciousness. No sore throat or dysphagia. No angina, palpitation or dizziness. No abdominal pain, diarrhea, constipation or blood in stools. No dysuria or hematuria. No new paresthesias next Megace. No polyuria polydipsia.  PHYSICAL EXAMINATION:  GENERAL: Patient is alert and oriented, no acute distress. No icterus. HEENT : EOMs intact. No oral thrush. No cervical adenopathy. CVS : S1-S2, regular rate and rhythm LUNGS : Bilaterally good air entry, no crepitations or rhonchi ABDOMEN : Soft, nontender, no hepatomegaly EXTREMITIES : Mild right upper extremity edema, chronic per patient and unchanged. Trace pedal edema. No cyanosis. NEURO: alert & oriented , cranial nerves intact, nonfocal.    LABORATORY DATA: Lab Results  Component Value Date   WBC 6.8 04/16/2013   HGB 12.7 04/16/2013   HCT 36.5 04/16/2013   MCV 103.7* 04/16/2013   PLT 191 04/16/2013      Chemistry      Component Value Date/Time   NA 137 04/16/2013 1203   K 3.8 04/16/2013 1203   CL 100 04/16/2013 1203   CO2 26 04/16/2013 1203   BUN 13 04/16/2013 1203   CREATININE 1.11* 04/16/2013 1203      Component Value Date/Time   CALCIUM 9.4 04/16/2013 1203   ALKPHOS 61 04/16/2013 1203   AST 13 04/16/2013 1203   ALT 10 04/16/2013 1203   BILITOT 0.3 04/16/2013 1203       RADIOGRAPHIC STUDIES: Nm Pet Image Restage (ps) Skull Base To Thigh  03/13/2013   *RADIOLOGY REPORT*  Clinical Data: Subsequent treatment strategy for breast cancer.  NUCLEAR MEDICINE PET SKULL BASE TO THIGH  Fasting Blood Glucose:  92  Technique:  17.9 mCi F-18 FDG was  injected intravenously. CT data was obtained and used for attenuation correction and anatomic localization only.  (This was not acquired as a diagnostic CT examination.) Additional exam technical data entered on technologist worksheet.  Comparison:  10/12/2012  Findings:  Neck: No hypermetabolic lymph nodes in the neck.  Chest:  The right breast mass measures 6.2 cm and has an SUV max equal to 6.5, image 79.  Previously the right breast mass measured 11.9 cm and had an SUV max equal to 14.9. There has been resolution of the tumor component invading the ventral chest wall.   There has been resolution of the previous hypermetabolic right axillary lymph nodes.  The right retropectoral lymph nodes are also resolved in the interval.  Hypermetabolic left breast tumor measures 1.6 x 5.5 cm and has an SUV max equal to 10.6.  Gas within this area is identified on the corresponding CT images and may be due to instrumentation or necrosis/infection.  On the previous exam tumor in the left breast measured 1.8 x 3.8 cm and had an SUV max equal  to 12.8.  No hypermetabolic mediastinal or hilar lymph nodes.  No hypermetabolic pulmonary nodule or mass identified.  Abdomen/Pelvis:  No abnormal hypermetabolic activity within the liver, pancreas, adrenal glands, or spleen.  No hypermetabolic lymph nodes in the abdomen or pelvis.  Skeleton:  No focal hypermetabolic activity to suggest skeletal metastasis.  IMPRESSION:  1.  Interval partial response to therapy. 2.  Significant decrease in size and degree of FDG uptake associated with the right breast mass. 3.  There has been resolution of previous hypermetabolic right axillary and retropectoral lymph nodes. 4.  Decrease in size and FDG uptake associated with the contralateral breast mass.   Original Report Authenticated By: Signa Kell, M.D.     ASSESSMENT / PLAN:  Right-sided, advanced, (T4B, N2) with possible metastatic disease to the left breast versus a simultaneous primary.  Her cancer was ER negative, PR negative, Ki-67 marker high at 70+ percent, and HER-2 was amplified with a ratio 2.89. She has been treated with carboplatin/docetaxel x 6 cycles (10/23/2012- 02/12/2013) plus Herceptin. Recently Perjeta was added to treatment regimen -  Patient is clinically doing steady, tolerating current treatment regimen without any new side effects. Right upper extremity lymphedema is unchanged. Plan is to proceed with next scheduled dose of Herceptin 6 mg/kg IV and Perjeta 420 mg IV today with same premedications as last time. Patient has been evaluated by surgeon and is reportedly scheduled for bilateral mastectomy on August 13. She is already aware after her discussion with Dr.Osuorji 3 weeks ago that surgery will provide local control of breast cancer and that given evidence of metastatic disease on PET scan she will need to continue on targeted therapy with Herceptin/Perjeta, and that despite these treatments there is a high risk of systemic progression/recurrence in the future. Given that she has surgery scheduled for August 13, will make next followup appointment here at 4 weeks from now with CBC, creatinine, LFT and to resume on targeted therapy with Herceptin/Perjeta. In between visits, patient was to call or come to ER in case of any new symptoms or acute sickness. She is agreeable to this plan.      Janese Banks, MD

## 2013-04-16 NOTE — Patient Instructions (Signed)
[facility] Discharge Instructions  RECOMMENDATIONS MADE BY THE CONSULTANT AND ANY TEST RESULTS WILL BE SENT TO YOUR REFERRING PHYSICIAN.  EXAM FINDINGS BY THE PHYSICIAN TODAY AND SIGNS OR SYMPTOMS TO REPORT TO CLINIC OR PRIMARY PHYSICIAN: Exam findings as discussed by Dr. Sherrlyn Hock.  SPECIAL INSTRUCTIONS/FOLLOW-UP: 1.  Please keep your appointment in 4 weeks for chemo and to see the MD.  Contact our office sooner if concerns.  Thank you for choosing [facility] to provide your oncology and hematology care.  To afford each patient quality time with our providers, please arrive at least 15 minutes before your scheduled appointment time.  With your help, our goal is to use those 15 minutes to complete the necessary work-up to ensure our physicians have the information they need to help with your evaluation and healthcare recommendations.    Effective January 1st, 2014, we ask that you re-schedule your appointment with our physicians should you arrive 10 or more minutes late for your appointment.  We strive to give you quality time with our providers, and arriving late affects you and other patients whose appointments are after yours.    Again, thank you for choosing [facility].  Our hope is that these requests will decrease the amount of time that you wait before being seen by our physicians.       _____________________________________________________________  Should you have questions after your visit to [facility], please contact our office at [phone] between the hours of 8:30 a.m. and 5:00 p.m.  Voicemails left after 4:30 p.m. will not be returned until the following business day.  For prescription refill requests, have your pharmacy contact our office with your prescription refill request.

## 2013-04-18 ENCOUNTER — Ambulatory Visit (HOSPITAL_COMMUNITY)
Admission: RE | Admit: 2013-04-18 | Discharge: 2013-04-18 | Disposition: A | Discharge status: Home / Self Care | Payer: Medicaid Other | Source: Ambulatory Visit | Attending: Oncology | Admitting: Oncology

## 2013-04-18 DIAGNOSIS — I10 Essential (primary) hypertension: Secondary | ICD-10-CM | POA: Insufficient documentation

## 2013-04-18 DIAGNOSIS — C50919 Malignant neoplasm of unspecified site of unspecified female breast: Secondary | ICD-10-CM

## 2013-04-18 DIAGNOSIS — I517 Cardiomegaly: Secondary | ICD-10-CM

## 2013-04-18 DIAGNOSIS — Z9221 Personal history of antineoplastic chemotherapy: Secondary | ICD-10-CM | POA: Insufficient documentation

## 2013-04-18 NOTE — Progress Notes (Signed)
*  PRELIMINARY RESULTS* Echocardiogram 2D Echocardiogram has been performed.  Erika Nunez 04/18/2013, 1:45 PM

## 2013-04-25 ENCOUNTER — Encounter (HOSPITAL_COMMUNITY): Payer: Self-pay | Admitting: Anesthesiology

## 2013-04-25 ENCOUNTER — Inpatient Hospital Stay (HOSPITAL_COMMUNITY)
Admission: RE | Admit: 2013-04-25 | Discharge: 2013-04-26 | DRG: 583 | Disposition: A | Discharge status: Home / Self Care | Payer: Medicaid Other | Source: Ambulatory Visit | Attending: General Surgery | Admitting: General Surgery

## 2013-04-25 ENCOUNTER — Ambulatory Visit (HOSPITAL_COMMUNITY): Payer: Medicaid Other | Admitting: Anesthesiology

## 2013-04-25 ENCOUNTER — Encounter (HOSPITAL_COMMUNITY)
Admission: RE | Disposition: A | Discharge status: Home / Self Care | Payer: Self-pay | Source: Ambulatory Visit | Attending: General Surgery

## 2013-04-25 ENCOUNTER — Encounter (HOSPITAL_COMMUNITY): Payer: Self-pay | Admitting: *Deleted

## 2013-04-25 DIAGNOSIS — C50919 Malignant neoplasm of unspecified site of unspecified female breast: Principal | ICD-10-CM | POA: Diagnosis present

## 2013-04-25 DIAGNOSIS — I10 Essential (primary) hypertension: Secondary | ICD-10-CM | POA: Diagnosis present

## 2013-04-25 DIAGNOSIS — Z853 Personal history of malignant neoplasm of breast: Secondary | ICD-10-CM

## 2013-04-25 DIAGNOSIS — Z9221 Personal history of antineoplastic chemotherapy: Secondary | ICD-10-CM

## 2013-04-25 DIAGNOSIS — C50911 Malignant neoplasm of unspecified site of right female breast: Secondary | ICD-10-CM

## 2013-04-25 DIAGNOSIS — E7800 Pure hypercholesterolemia, unspecified: Secondary | ICD-10-CM | POA: Diagnosis present

## 2013-04-25 DIAGNOSIS — Z923 Personal history of irradiation: Secondary | ICD-10-CM

## 2013-04-25 DIAGNOSIS — Z9010 Acquired absence of unspecified breast and nipple: Secondary | ICD-10-CM

## 2013-04-25 HISTORY — PX: SIMPLE MASTECTOMY WITH AXILLARY SENTINEL NODE BIOPSY: SHX6098

## 2013-04-25 SURGERY — SIMPLE MASTECTOMY
Anesthesia: General | Site: Breast | Laterality: Bilateral | Wound class: Clean

## 2013-04-25 MED ORDER — CEFAZOLIN SODIUM-DEXTROSE 2-3 GM-% IV SOLR
INTRAVENOUS | Status: AC
  Filled 2013-04-25: qty 50

## 2013-04-25 MED ORDER — ONDANSETRON HCL 4 MG/2ML IJ SOLN: 4.0000 mg | Freq: Once | INTRAMUSCULAR | Status: DC | PRN

## 2013-04-25 MED ORDER — PROPOFOL 10 MG/ML IV BOLUS
INTRAVENOUS | Status: DC | PRN
  Administered 2013-04-25: 180 mg via INTRAVENOUS

## 2013-04-25 MED ORDER — PROPOFOL 10 MG/ML IV EMUL
INTRAVENOUS | Status: AC
  Filled 2013-04-25: qty 20

## 2013-04-25 MED ORDER — FENTANYL CITRATE 0.05 MG/ML IJ SOLN
INTRAMUSCULAR | Status: AC
  Filled 2013-04-25: qty 2

## 2013-04-25 MED ORDER — LORAZEPAM 1 MG PO TABS: 1.0000 mg | ORAL_TABLET | ORAL | Status: DC | PRN

## 2013-04-25 MED ORDER — SUCCINYLCHOLINE CHLORIDE 20 MG/ML IJ SOLN
INTRAMUSCULAR | Status: AC
  Filled 2013-04-25: qty 1

## 2013-04-25 MED ORDER — ONDANSETRON HCL 4 MG PO TABS: 8.0000 mg | ORAL_TABLET | Freq: Three times a day (TID) | ORAL | Status: DC | PRN

## 2013-04-25 MED ORDER — MIDAZOLAM HCL 2 MG/2ML IJ SOLN
INTRAMUSCULAR | Status: AC
  Filled 2013-04-25: qty 2

## 2013-04-25 MED ORDER — HYDROCODONE-ACETAMINOPHEN 5-325 MG PO TABS
1.0000 | ORAL_TABLET | ORAL | Status: DC | PRN
  Administered 2013-04-25 (×2): 1 via ORAL
  Administered 2013-04-26: 2 via ORAL
  Filled 2013-04-25: qty 1
  Filled 2013-04-25: qty 2
  Filled 2013-04-25: qty 1

## 2013-04-25 MED ORDER — ARTIFICIAL TEARS OP OINT
TOPICAL_OINTMENT | OPHTHALMIC | Status: AC
  Filled 2013-04-25: qty 3.5

## 2013-04-25 MED ORDER — MIDAZOLAM HCL 2 MG/2ML IJ SOLN
1.0000 mg | INTRAMUSCULAR | Status: AC | PRN
  Administered 2013-04-25 (×3): 2 mg via INTRAVENOUS

## 2013-04-25 MED ORDER — ONDANSETRON HCL 4 MG/2ML IJ SOLN
INTRAMUSCULAR | Status: AC
  Filled 2013-04-25: qty 2

## 2013-04-25 MED ORDER — BUPIVACAINE HCL (PF) 0.5 % IJ SOLN
INTRAMUSCULAR | Status: DC | PRN
  Administered 2013-04-25: 10 mL

## 2013-04-25 MED ORDER — ENOXAPARIN SODIUM 40 MG/0.4ML ~~LOC~~ SOLN
40.0000 mg | Freq: Once | SUBCUTANEOUS | Status: AC
  Administered 2013-04-25: 40 mg via SUBCUTANEOUS

## 2013-04-25 MED ORDER — BUPIVACAINE HCL (PF) 0.5 % IJ SOLN
INTRAMUSCULAR | Status: AC
  Filled 2013-04-25: qty 30

## 2013-04-25 MED ORDER — NEOSTIGMINE METHYLSULFATE 1 MG/ML IJ SOLN
INTRAMUSCULAR | Status: AC
  Filled 2013-04-25: qty 1

## 2013-04-25 MED ORDER — ROCURONIUM BROMIDE 50 MG/5ML IV SOLN
INTRAVENOUS | Status: AC
  Filled 2013-04-25: qty 1

## 2013-04-25 MED ORDER — PROCHLORPERAZINE MALEATE 5 MG PO TABS: 10.0000 mg | ORAL_TABLET | Freq: Four times a day (QID) | ORAL | Status: DC | PRN

## 2013-04-25 MED ORDER — FENTANYL CITRATE 0.05 MG/ML IJ SOLN
INTRAMUSCULAR | Status: AC
  Filled 2013-04-25: qty 5

## 2013-04-25 MED ORDER — SIMVASTATIN 20 MG PO TABS
20.0000 mg | ORAL_TABLET | Freq: Every day | ORAL | Status: DC
  Administered 2013-04-25: 20 mg via ORAL
  Filled 2013-04-25: qty 1

## 2013-04-25 MED ORDER — NEOSTIGMINE METHYLSULFATE 1 MG/ML IJ SOLN
INTRAMUSCULAR | Status: DC | PRN
  Administered 2013-04-25: 4 mg via INTRAVENOUS

## 2013-04-25 MED ORDER — MIDAZOLAM HCL 5 MG/5ML IJ SOLN
INTRAMUSCULAR | Status: DC | PRN
  Administered 2013-04-25: 2 mg via INTRAVENOUS

## 2013-04-25 MED ORDER — CHLORHEXIDINE GLUCONATE 4 % EX LIQD: 1.0000 "application " | Freq: Once | CUTANEOUS | Status: DC

## 2013-04-25 MED ORDER — FENTANYL CITRATE 0.05 MG/ML IJ SOLN
25.0000 ug | INTRAMUSCULAR | Status: DC | PRN
  Administered 2013-04-25 (×2): 50 ug via INTRAVENOUS

## 2013-04-25 MED ORDER — ROCURONIUM BROMIDE 100 MG/10ML IV SOLN
INTRAVENOUS | Status: DC | PRN
  Administered 2013-04-25: 35 mg via INTRAVENOUS

## 2013-04-25 MED ORDER — LIDOCAINE HCL (CARDIAC) 20 MG/ML IV SOLN
INTRAVENOUS | Status: DC | PRN
  Administered 2013-04-25: 50 mg via INTRAVENOUS

## 2013-04-25 MED ORDER — LACTATED RINGERS IV SOLN
INTRAVENOUS | Status: DC
  Administered 2013-04-25: 11:00:00 via INTRAVENOUS

## 2013-04-25 MED ORDER — ENOXAPARIN SODIUM 40 MG/0.4ML ~~LOC~~ SOLN
SUBCUTANEOUS | Status: AC
  Filled 2013-04-25: qty 0.4

## 2013-04-25 MED ORDER — LACTATED RINGERS IV SOLN
INTRAVENOUS | Status: DC | PRN
  Administered 2013-04-25 (×2): via INTRAVENOUS

## 2013-04-25 MED ORDER — CELECOXIB 100 MG PO CAPS
200.0000 mg | ORAL_CAPSULE | Freq: Two times a day (BID) | ORAL | Status: DC
  Administered 2013-04-25 – 2013-04-26 (×2): 200 mg via ORAL
  Filled 2013-04-25 (×2): qty 2

## 2013-04-25 MED ORDER — FENTANYL CITRATE 0.05 MG/ML IJ SOLN
INTRAMUSCULAR | Status: DC | PRN
  Administered 2013-04-25: 50 ug via INTRAVENOUS
  Administered 2013-04-25: 100 ug via INTRAVENOUS
  Administered 2013-04-25 (×3): 50 ug via INTRAVENOUS
  Administered 2013-04-25: 100 ug via INTRAVENOUS
  Administered 2013-04-25 (×4): 50 ug via INTRAVENOUS

## 2013-04-25 MED ORDER — GLYCOPYRROLATE 0.2 MG/ML IJ SOLN
INTRAMUSCULAR | Status: DC | PRN
  Administered 2013-04-25: 0.6 mg via INTRAVENOUS

## 2013-04-25 MED ORDER — ONDANSETRON HCL 4 MG/2ML IJ SOLN
4.0000 mg | Freq: Once | INTRAMUSCULAR | Status: AC
  Administered 2013-04-25: 4 mg via INTRAVENOUS

## 2013-04-25 MED ORDER — POTASSIUM CHLORIDE CRYS ER 20 MEQ PO TBCR
40.0000 meq | EXTENDED_RELEASE_TABLET | Freq: Four times a day (QID) | ORAL | Status: DC
  Administered 2013-04-25 – 2013-04-26 (×4): 40 meq via ORAL
  Filled 2013-04-25 (×4): qty 2

## 2013-04-25 MED ORDER — SODIUM CHLORIDE 0.9 % IR SOLN
Status: DC | PRN
  Administered 2013-04-25: 1000 mL

## 2013-04-25 MED ORDER — EPHEDRINE SULFATE 50 MG/ML IJ SOLN
INTRAMUSCULAR | Status: AC
  Filled 2013-04-25: qty 1

## 2013-04-25 MED ORDER — LIDOCAINE HCL (PF) 1 % IJ SOLN
INTRAMUSCULAR | Status: AC
  Filled 2013-04-25: qty 2

## 2013-04-25 MED ORDER — LIDOCAINE HCL (PF) 1 % IJ SOLN
INTRAMUSCULAR | Status: AC
  Filled 2013-04-25: qty 5

## 2013-04-25 MED ORDER — CEFAZOLIN SODIUM-DEXTROSE 2-3 GM-% IV SOLR
2.0000 g | INTRAVENOUS | Status: AC
  Administered 2013-04-25: 2 g via INTRAVENOUS

## 2013-04-25 MED ORDER — LISINOPRIL 10 MG PO TABS
20.0000 mg | ORAL_TABLET | Freq: Every day | ORAL | Status: DC
  Administered 2013-04-25 – 2013-04-26 (×2): 20 mg via ORAL
  Filled 2013-04-25 (×2): qty 2

## 2013-04-25 MED ORDER — EPHEDRINE SULFATE 50 MG/ML IJ SOLN
INTRAMUSCULAR | Status: DC | PRN
  Administered 2013-04-25: 10 mg via INTRAVENOUS

## 2013-04-25 SURGICAL SUPPLY — 38 items
BAG HAMPER (MISCELLANEOUS) ×2 IMPLANT
CLOTH BEACON ORANGE TIMEOUT ST (SAFETY) ×2 IMPLANT
COVER LIGHT HANDLE STERIS (MISCELLANEOUS) ×4 IMPLANT
DECANTER SPIKE VIAL GLASS SM (MISCELLANEOUS) ×2 IMPLANT
DRSG XEROFORM 1X8 (GAUZE/BANDAGES/DRESSINGS) ×4 IMPLANT
DURAPREP 26ML APPLICATOR (WOUND CARE) ×2 IMPLANT
ELECT REM PT RETURN 9FT ADLT (ELECTROSURGICAL) ×2
ELECTRODE REM PT RTRN 9FT ADLT (ELECTROSURGICAL) ×1 IMPLANT
EVACUATOR DRAINAGE 10X20 100CC (DRAIN) ×2 IMPLANT
EVACUATOR SILICONE 100CC (DRAIN) ×2
GLOVE BIOGEL PI IND STRL 7.0 (GLOVE) ×4 IMPLANT
GLOVE BIOGEL PI IND STRL 7.5 (GLOVE) ×1 IMPLANT
GLOVE BIOGEL PI INDICATOR 7.0 (GLOVE) ×4
GLOVE BIOGEL PI INDICATOR 7.5 (GLOVE) ×1
GLOVE ECLIPSE 6.5 STRL STRAW (GLOVE) ×2 IMPLANT
GLOVE ECLIPSE 7.0 STRL STRAW (GLOVE) ×4 IMPLANT
GLOVE EXAM NITRILE LRG STRL (GLOVE) ×2 IMPLANT
GLOVE SS BIOGEL STRL SZ 6.5 (GLOVE) ×1 IMPLANT
GLOVE SUPERSENSE BIOGEL SZ 6.5 (GLOVE) ×1
GOWN STRL REIN XL XLG (GOWN DISPOSABLE) ×10 IMPLANT
KIT ROOM TURNOVER APOR (KITS) ×2 IMPLANT
MANIFOLD NEPTUNE II (INSTRUMENTS) ×2 IMPLANT
NEEDLE HYPO 25X1 1.5 SAFETY (NEEDLE) ×2 IMPLANT
NS IRRIG 1000ML POUR BTL (IV SOLUTION) ×2 IMPLANT
PACK MINOR (CUSTOM PROCEDURE TRAY) ×2 IMPLANT
PAD ARMBOARD 7.5X6 YLW CONV (MISCELLANEOUS) ×2 IMPLANT
SET BASIN LINEN APH (SET/KITS/TRAYS/PACK) ×2 IMPLANT
SPONGE DRAIN TRACH 4X4 STRL 2S (GAUZE/BANDAGES/DRESSINGS) ×4 IMPLANT
SPONGE GAUZE 4X4 12PLY (GAUZE/BANDAGES/DRESSINGS) ×2 IMPLANT
SPONGE LAP 18X18 X RAY DECT (DISPOSABLE) ×4 IMPLANT
STAPLER VISISTAT 35W (STAPLE) ×4 IMPLANT
SUT ETHILON 3 0 FSL (SUTURE) ×2 IMPLANT
SUT MNCRL AB 4-0 PS2 18 (SUTURE) ×2 IMPLANT
SUT VIC AB 3-0 SH 27 (SUTURE) ×4
SUT VIC AB 3-0 SH 27X BRD (SUTURE) ×4 IMPLANT
SYR BULB IRRIGATION 50ML (SYRINGE) ×2 IMPLANT
SYR CONTROL 10ML LL (SYRINGE) ×2 IMPLANT
TAPE CLOTH SURG 4X10 WHT LF (GAUZE/BANDAGES/DRESSINGS) ×2 IMPLANT

## 2013-04-25 NOTE — Anesthesia Postprocedure Evaluation (Addendum)
  Anesthesia Post-op Note  Patient: Erika Nunez  Procedure(s) Performed: Procedure(s): SIMPLE MASTECTOMY (Bilateral)  Patient Location: PACU  Anesthesia Type:General  Level of Consciousness: awake, oriented and patient cooperative  Airway and Oxygen Therapy: Patient Spontanous Breathing and Patient connected to face mask oxygen  Post-op Pain: mild  Post-op Assessment: Post-op Vital signs reviewed, Patient's Cardiovascular Status Stable, Respiratory Function Stable, Patent Airway, No signs of Nausea or vomiting and Pain level controlled  Post-op Vital Signs: Reviewed and stable  Complications: No apparent anesthesia complications 04/26/13  Patient doing very well, VSS.  No apparent anesthesia complications.

## 2013-04-25 NOTE — Anesthesia Procedure Notes (Signed)
Procedure Name: Intubation Date/Time: 04/25/2013 1:47 PM Performed by: Carolyne Littles, Silvestre Mines L Pre-anesthesia Checklist: Patient identified, Patient being monitored, Timeout performed, Emergency Drugs available and Suction available Patient Re-evaluated:Patient Re-evaluated prior to inductionOxygen Delivery Method: Circle System Utilized Preoxygenation: Pre-oxygenation with 100% oxygen Intubation Type: IV induction Ventilation: Mask ventilation without difficulty Laryngoscope Size: 3 and Miller Grade View: Grade I Tube type: Oral Tube size: 7.0 mm Number of attempts: 1 Airway Equipment and Method: stylet Placement Confirmation: ETT inserted through vocal cords under direct vision,  positive ETCO2 and breath sounds checked- equal and bilateral Secured at: 21 cm Tube secured with: Tape Dental Injury: Teeth and Oropharynx as per pre-operative assessment

## 2013-04-25 NOTE — Interval H&P Note (Signed)
History and Physical Interval Note:  04/25/2013 12:56 PM  [name]  has presented today for surgery, with the diagnosis of bilateral breast carcinoma  The various methods of treatment have been discussed with the patient and family. After consideration of risks, benefits and other options for treatment, the patient has consented to  Procedure(s): SIMPLE MASTECTOMY (Bilateral) as a surgical intervention .  The patient's history has been reviewed, patient examined, no change in status, stable for surgery.  I have reviewed the patient's chart and labs.  Questions were answered to the patient's satisfaction.     [name] C

## 2013-04-25 NOTE — Preoperative (Signed)
Beta Blockers   Reason not to administer Beta Blockers:Not Applicable 

## 2013-04-25 NOTE — Anesthesia Preprocedure Evaluation (Signed)
Anesthesia Evaluation  Patient identified by MRN, date of birth, ID band Patient awake    Reviewed: Allergy & Precautions, H&P , NPO status , Patient's Chart, lab work & pertinent test results  History of Anesthesia Complications Negative for: history of anesthetic complications  Airway Mallampati: II      Dental  (+) Edentulous Upper and Edentulous Lower   Pulmonary neg pulmonary ROS,  breath sounds clear to auscultation        Cardiovascular hypertension, Pt. on medications Rhythm:Regular Rate:Normal     Neuro/Psych    GI/Hepatic negative GI ROS,   Endo/Other    Renal/GU      Musculoskeletal   Abdominal   Peds  Hematology   Anesthesia Other Findings   Reproductive/Obstetrics                           Anesthesia Physical Anesthesia Plan  ASA: II  Anesthesia Plan: General   Post-op Pain Management:    Induction: Intravenous  Airway Management Planned: Oral ETT  Additional Equipment:   Intra-op Plan:   Post-operative Plan: Extubation in OR  Informed Consent: I have reviewed the patients History and Physical, chart, labs and discussed the procedure including the risks, benefits and alternatives for the proposed anesthesia with the patient or authorized representative who has indicated his/her understanding and acceptance.     Plan Discussed with:   Anesthesia Plan Comments:         Anesthesia Quick Evaluation

## 2013-04-25 NOTE — H&P (Signed)
Erika Nunez is an 62 y.o. female.  Chief Complaint: Right breast abscess  HPI: Patient presented to the emergency department with discomfort and drainage from the right breast. She states over the last 6 months she has noted a change within the breast. Over the last 6 weeks this is increased in she's had increased drainage and odor. She is being clean the area with peroxide and soap and water at least daily over this period of time. He has continued to progress. She denies any significant fevers or chills. She has had approximately 10 pound weight loss over the last month to 2. She states she had a previous cyst in this area several years ago which resolved spontaneously. She didn't notice another cyst in the similar area. This nodule did increase in size somewhat. She had no biopsy previously. No history of mammogram or ultrasound. No family history of breast cancer. No ovarian or uterine cancer in the family.  Past Medical History   Diagnosis  Date   .  Hypertension    .  High cholesterol     Past Surgical History   Procedure  Date   .  Cholecystectomy    .  Tubal ligation    .  Tonsillectomy    No family history on file.  Social History: reports that she has never smoked. She does not have any smokeless tobacco history on file. She reports that she does not drink alcohol or use illicit drugs.  Allergies:  Allergies   Allergen  Reactions   .  Codeine  Hives    Medications Prior to Admission   Medication  Sig  Dispense  Refill   .  ibuprofen (ADVIL,MOTRIN) 200 MG tablet  Take 200 mg by mouth every 6 (six) hours as needed. Pain.     Marland Kitchen  lisinopril (PRINIVIL,ZESTRIL) 20 MG tablet  Take 20 mg by mouth daily.     Marland Kitchen  lisinopril-hydrochlorothiazide (PRINZIDE,ZESTORETIC) 20-25 MG per tablet  Take 1 tablet by mouth daily.     .  pravastatin (PRAVACHOL) 40 MG tablet  Take 80 mg by mouth at bedtime.     .  sertraline (ZOLOFT) 100 MG tablet  Take 150 mg by mouth daily.      Results for  orders placed during the hospital encounter of 10/01/12 (from the past 48 hour(s))   CBC WITH DIFFERENTIAL Status: Abnormal    Collection Time    10/01/12 11:07 AM   Component  Value  Range  Comment    WBC  14.0 (*)  4.0 - 10.5 K/uL     RBC  4.04  3.87 - 5.11 MIL/uL     Hemoglobin  11.8 (*)  12.0 - 15.0 g/dL     HCT  02.7 (*)  25.3 - 46.0 %     MCV  86.9  78.0 - 100.0 fL     MCH  29.2  26.0 - 34.0 pg     MCHC  33.6  30.0 - 36.0 g/dL     RDW  66.4  40.3 - 47.4 %     Platelets  565 (*)  150 - 400 K/uL     Neutrophils Relative  80 (*)  43 - 77 %     Neutro Abs  11.1 (*)  1.7 - 7.7 K/uL     Lymphocytes Relative  11 (*)  12 - 46 %     Lymphs Abs  1.6  0.7 - 4.0 K/uL     Monocytes  Relative  8  3 - 12 %     Monocytes Absolute  1.1 (*)  0.1 - 1.0 K/uL     Eosinophils Relative  1  0 - 5 %     Eosinophils Absolute  0.1  0.0 - 0.7 K/uL     Basophils Relative  0  0 - 1 %     Basophils Absolute  0.0  0.0 - 0.1 K/uL    BASIC METABOLIC PANEL Status: Abnormal    Collection Time    10/01/12 11:07 AM   Component  Value  Range  Comment    Sodium  131 (*)  135 - 145 mEq/L     Potassium  3.1 (*)  3.5 - 5.1 mEq/L     Chloride  92 (*)  96 - 112 mEq/L     CO2  27  19 - 32 mEq/L     Glucose, Bld  107 (*)  70 - 99 mg/dL     BUN  9  6 - 23 mg/dL     Creatinine, Ser  1.61 (*)  0.50 - 1.10 mg/dL     Calcium  9.9  8.4 - 10.5 mg/dL     GFR calc non Af Amer  47 (*)  >90 mL/min     GFR calc Af Amer  54 (*)  >90 mL/min    No results found.  Review of Systems  Constitutional: Positive for weight loss. Negative for fever and chills.  HENT: Negative.  Eyes: Negative.  Respiratory: Negative.  Cardiovascular: Positive for chest pain.  Gastrointestinal: Negative.  Genitourinary: Negative.  Musculoskeletal: Negative.  Skin:  Drainage for the last 6 weeks in right breast. Redness in thickness.  Neurological: Negative. Negative for weakness.  Endo/Heme/Allergies: Negative.  Psychiatric/Behavioral: Negative.   Blood pressure 113/58, pulse 88, temperature 97.8 F (36.6 Nunez), temperature source Oral, resp. rate 20, height 5\' 8"  (1.727 m), weight 68.04 kg (150 lb), SpO2 98.00%.  Physical Exam  Constitutional: She is oriented to person, place, and time. She appears well-developed and well-nourished. No distress.  HENT:  Head: Normocephalic and atraumatic.  Eyes: Conjunctivae normal and EOM are normal. Pupils are equal, round, and reactive to light.  Neck: Normal range of motion. Neck supple. No tracheal deviation present. No thyromegaly present.  Cardiovascular: Normal rate, regular rhythm and normal heart sounds.  Respiratory: Effort normal and breath sounds normal.  GI: Soft. Bowel sounds are normal. She exhibits no distension. There is no tenderness.  Musculoskeletal: Normal range of motion.  Lymphadenopathy:  She has no cervical adenopathy.  Neurological: She is alert and oriented to person, place, and time.  Skin:  Right breast: The majority of the inferior and lateral breast is thickened. There is necrosis with sloughing along the lower outer quadrant. There is significant discharge. There is significant odor.  Assessment/Plan  Necrotic breast mass of the right breast. I am significantly concerned regarding the findings. 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 Have discussed with the patient attempting to biopsy this tomorrow in the operating room in attempt to clean some necrotic tissue. Oncology consult will also be obtained in order to expedite any neoadjuvant treatment. Continue IV antibiotics. Continue local wound care.  Erika Nunez  Post Operative Follow Up - 03/27/2013  Patient Name: Erika Nunez Date of Birth: 08-20-1951  Vital Signs:  Vitals as of: 03/27/2013: Systolic 137: Diastolic 76: Heart Rate 95: Temp 36.39C: Height 168CM: Weight 87.09KG: BMI 30.99   BMI: 30.99 kg/m2  Subjective: This 7 Years 62 Months old Female presents for  followup.   Bilateral breast mass/breast cancer Patient   has no  significant complaints.somewhat tired from her treatments. Last treatment was one week ago.   Social History:   Social History  Preferred Language: English Race:  White Ethnicity: Other Age: 46 Years 9 Months Marital Status:  S       Allergies:  Allergies Insert Code: No allergies found.     Objective: General: Well appearing, well nourished in no distress.   patient does demonstrate alopecia  chest wall: Previous ulcer site improved. Port in position.    Assessment:     PO ZOX:WRUEAV  Plan:   Breast malignancy, bilateral breast mass. I did review the patient's recent PET scan with her and her family. It is suggestive of response to her current chemotherapy treatment. I do feel it is reasonable to discuss bilateral mastectomy at this time. All surgical options have been discussed at length with the patient. All risks benefits and alternatives have been discussed.   patient does wish to proceed and we will schedule her earliest convenience. Due to her recent treatment I do feel we need to give some time for her to recover from her most recent dosage  Follow-up:Pending Surgery

## 2013-04-25 NOTE — Transfer of Care (Signed)
Immediate Anesthesia Transfer of Care Note  Patient: Erika Nunez  Procedure(s) Performed: Procedure(s): SIMPLE MASTECTOMY (Bilateral)  Patient Location: PACU  Anesthesia Type:General  Level of Consciousness: awake, oriented and patient cooperative  Airway & Oxygen Therapy: Patient Spontanous Breathing and Patient connected to face mask oxygen  Post-op Assessment: Report given to PACU RN and Post -op Vital signs reviewed and stable  Post vital signs: Reviewed and stable  Complications: No apparent anesthesia complications

## 2013-04-25 NOTE — Progress Notes (Signed)
Social visit:  Patient underwent B/L mastectomy today for her breast cancer.  She tolerated the procedure well without any apparent complications.  She is seen in bed with her son at the bedside.  She is smiling and in good spirits.  She denies needing anything from me.  We will see her as an outpatient as scheduled.   Erika Nunez

## 2013-04-25 NOTE — Op Note (Signed)
Patient:  Erika Nunez  DOB:  [birth year]  MRN:  [id number]   Preop Diagnosis:  Metastatic breast cancer  Postop Diagnosis:  The same  Procedure:   Bilateral simple mastectomy  Surgeon:  Dr. Tilford Pillar  Anes:  General endotracheal, 0.5% Sensorcaine plain for local  Indications:  Patient is a 62 year old female with a history of breast cancer. This is noted metastatic and she has RE undergone chemotherapy and radiation therapy. She has demonstrated excellent response to the chemotherapy with significant reduction in the tumor size. Based on this options were discussed with the patient and oncology about possible mastectomy. Risks benefits alternatives of a bilateral mastectomy were discussed at length the patient including but not limited to risk of bleeding, infection, skin dehiscence or skin flap necrosis. Her questions and concerns are addressed the patient was consented for the planned procedure.  Procedure note:  Patient is taken to the operator is placed in supine position the or table time the general anesthetic is administered. Once patient was asleep symmetrically intubated by the nurse anesthetist. At this point her chest wall was prepped with DuraPrep solution bilaterally and draped in standard fashion. Time out was performed. A marking pen is utilized to [place] the planned elliptical incision on the left breast. Incision was created with a 10 blade scalpel additional dissection is carried out using electrocautery. This is utilized to elevate the skin flaps pain particular attention to the thickness of the skin flaps. Initially to the dissection superiorly to the clavicle. Inferiorly to the shelving portion of the breast. Laterally the dissection was carried out to the axillary fat pad. Once the skin flaps having created the breast tissue is elevated off of the prepectoralis fascia using electrocautery. This is taken from the sternum laterally to the axilla insuring that the axillary  tail is included within the specimen. During this dissection hemostasis is obtained using electrocautery. Once the breast tissue is free is placed in the back table and sent as a perm specimen to pathology. A moistened sponge is placed into the wound and attention is then turned to the right side.  Similarly the planned incision was created with a marking pen. Due to the presence of several soft apparent skin lesions around the nipple areola complex the skin edges were marked slightly wider than normal. I created a skin incision with a 10 blade scalpel. Additional dissection again was carried out using electrocautery to create the skin flaps. This sided breast is noted to have significantly more nodularity and any palpable nodule was included within the specimen is a continue to elevate the skin flaps. Again the boundaries were medially to the sternum laterally to the axillary fat pad superiorly to the clavicle inferiorly to the mammary fold. Once the skin flaps were created the breast tissue is elevated off the prepectoralis fascia medially to laterally. Again hemostasis is obtained using electrocautery. Once the breast tissue free is placed on the back table and sent as a separate specimen to pathology. At this point I created a stab incision laterally under both incisions and 210 flat JP drains were placed one in either side of the chest wall. The deep subcuticular tissue was then reapproximated on both sides using a running 2-0 Vicryl suture. Simple interrupted sutures were placed on the right side deep to the slightly, nature of the skin flaps. While the skin flaps were slightly concave they were able to be approximated without any significant tension. The skin edges were reapproximated using skin staples on  both sides. The drains were secured to the skin with a 3-0 nylon. The skin was then washed dried moist dry towel. Dressings were placed the drapes removed the dressings were secured. The patient was  allowed to come out of general anesthetic. She was transferred to the PACU in stable condition. At the conclusion of procedure all instrument, sponge, needle counts are correct. Patient tolerated procedure extremely well.  Complications:  None apparent  EBL:  100 ML's  Specimen:  Right and left breast tissue

## 2013-04-26 MED ORDER — HYDROCODONE-ACETAMINOPHEN 5-325 MG PO TABS: 1.0000 | ORAL_TABLET | ORAL | Status: DC | PRN

## 2013-04-26 MED ORDER — ATROPINE SULFATE 0.4 MG/ML IJ SOLN
INTRAMUSCULAR | Status: AC
  Filled 2013-04-26: qty 1

## 2013-04-26 NOTE — Plan of Care (Signed)
Problem: Phase I Progression Outcomes Goal: Other Phase I Outcomes/Goals Outcome: Completed/Met Date Met:  04/26/13 Pain controled with PO med Pt is up and about in room

## 2013-04-26 NOTE — Care Management Note (Unsigned)
    Page 1 of 1   04/26/2013     1:56:36 PM   CARE MANAGEMENT NOTE 04/26/2013  Patient:  Erika Nunez, Erika Nunez   Account Number:  [id number]  Date Initiated:  04/26/2013  Documentation initiated by:  Rosemary Holms  Subjective/Objective Assessment:   Pt admitted from home where she lives with her spouse. Per surgeon, plans to DC today, comfortable with drains and care. No HH needs.     Action/Plan:   Anticipated DC Date:  04/26/2013   Anticipated DC Plan:  HOME/SELF CARE      DC Planning Services  CM consult      Choice offered to / List presented to:             Status of service:  Completed, signed off Medicare Important Message given?   (If response is "NO", the following Medicare IM given date fields will be blank) Date Medicare IM given:   Date Additional Medicare IM given:    Discharge Disposition:  HOME/SELF CARE  Per UR Regulation:    If discussed at Long Length of Stay Meetings, dates discussed:    Comments:  04/26/13 Rosemary Holms RN BSN CM

## 2013-04-26 NOTE — Progress Notes (Signed)
UR Chart Review Completed  

## 2013-04-27 ENCOUNTER — Encounter (HOSPITAL_COMMUNITY): Payer: Self-pay | Admitting: General Surgery

## 2013-05-09 NOTE — Discharge Summary (Signed)
Physician Discharge Summary  Patient ID: Erika Nunez MRN: [id number] DOB/AGE: 12-19-1950 62 y.o.  Admit date: 04/25/2013 Discharge date: 05/09/2013  Admission Diagnoses: Metastatic breast cancer  Discharge Diagnoses: The same Active Problems:   * No active hospital problems. *   Discharged Condition: stable  Hospital Course: Patient presented to [facility] for planned bilateral simple mastectomy. Patient has known history of metastatic breast cancer extending from both breasts. She undergone neoadjuvant treatment with chemotherapy with excellent response. Due to her response the options of surgical intervention were discussed. Patient was brought to the hospital for planned mastectomy. She underwent the bilateral simple mastectomies comments brief period in the postanesthetic care unit and was transferred up to a regular surgical floor. On the subsequent day her pain was controlled. She was comfortable. She was tolerating regular diet. Plans were made for discharge to home.  Consults: None  Significant Diagnostic Studies: labs: Morning labs  Treatments: surgery: Bilateral simple mastectomy  Discharge Exam: Blood pressure 148/79, pulse 80, temperature 98.2 F (36.8 C), temperature source Oral, resp. rate 20, height 5\' 7"  (1.702 m), weight 86.183 kg (190 lb), SpO2 92.00%. General appearance: alert and no distress Resp: clear to auscultation bilaterally Breasts: Incisions are clean dry and intact. Staples are present. Skin flaps are viable. JP drains are serous sanguinous. Cardio: regular rate and rhythm  Disposition: 01-Home or Self Care  Discharge Orders   Future Appointments Provider Department Dept Phone   05/15/2013 9:00 AM [facility] Covering Provider [facility] [phone]   05/15/2013 10:15 AM [facility] Team B [facility] [phone]   Future Orders Complete By Expires   Diet - low sodium heart healthy  As directed    Discharge  instructions  As directed    Comments:     Increase activity as tolerated. May place ice pack for comfort.  Alternate an anti-inflammatory such as ibuprofen (Motrin, Advil) 400-600mg  every 6 hours with the prescribed pain medication.   Do not take any additional acetaminophen as there is Tylenol in the pain medication.   Discharge wound care:  As directed    Comments:     May shower but protect drain sites.  Drain care:  Continue at least daily and as needed.  Record roughly the amount of discharge.   Driving Restrictions  As directed    Comments:     No driving while on pain medications.   Increase activity slowly  As directed    Lifting restrictions  As directed    Comments:     Minimize excessive or heavy over head lifting.  Do continue Range of motion exercise for shoulders.       Medication List         HERCEPTIN IV  Inject into the vein every 21 ( twenty-one) days.     HYDROcodone-acetaminophen 5-325 MG per tablet  Commonly known as:  NORCO/VICODIN  Take 1-2 tablets by mouth every 4 (four) hours as needed for pain.     ibuprofen 200 MG tablet  Commonly known as:  ADVIL,MOTRIN  Take 200 mg by mouth every 6 (six) hours as needed. Pain.     lidocaine-prilocaine cream  Commonly known as:  EMLA  Apply topically once. Apply a quarter sized amount to port site 1 hour prior to chemo. Do not rub in. Cover with plastic.     lisinopril 20 MG tablet  Commonly known as:  PRINIVIL,ZESTRIL  Take 20 mg by mouth daily.     lisinopril-hydrochlorothiazide  20-25 MG per tablet  Commonly known as:  PRINZIDE,ZESTORETIC  Take 1 tablet by mouth daily.     LORazepam 1 MG tablet  Commonly known as:  ATIVAN  Take 1 tablet (1 mg total) by mouth every 4 (four) hours as needed. For nausea/vomiting. Do not drive while taking this medication. This medication may make you drowsy or sleepy. Change positions slowly.     magnesium oxide 400 (241.3 MG) MG tablet  Commonly known as:  MAG-OX  Take  1 tablet (400 mg total) by mouth 3 (three) times daily.     ondansetron 8 MG tablet  Commonly known as:  ZOFRAN  Take 8 mg by mouth. Starting the day after chemo, take 1 tablet in the am and 1 tablet in the pm for 2 days. Then may take 1 tablet two times a day IF needed for nausea/vomiting.     potassium chloride SA 20 MEQ tablet  Commonly known as:  K-DUR,KLOR-CON  Take 2 tablets (40 mEq total) by mouth 4 (four) times daily.     pravastatin 80 MG tablet  Commonly known as:  PRAVACHOL  Take 80 mg by mouth at bedtime.     prochlorperazine 10 MG tablet  Commonly known as:  COMPAZINE  Take 1 tablet (10 mg total) by mouth every 6 (six) hours as needed.         Signed: Earnie Bechard C 05/09/2013, 10:14 AM

## 2013-05-15 ENCOUNTER — Encounter (HOSPITAL_COMMUNITY): Payer: Self-pay

## 2013-05-15 ENCOUNTER — Encounter (HOSPITAL_BASED_OUTPATIENT_CLINIC_OR_DEPARTMENT_OTHER): Payer: Medicaid Other

## 2013-05-15 ENCOUNTER — Encounter (HOSPITAL_COMMUNITY): Payer: Medicaid Other | Attending: Oncology

## 2013-05-15 VITALS — BP 114/57 | HR 84 | Temp 97.7°F | Resp 18 | Wt 183.0 lb

## 2013-05-15 DIAGNOSIS — Z170 Estrogen receptor positive status [ER+]: Secondary | ICD-10-CM | POA: Insufficient documentation

## 2013-05-15 DIAGNOSIS — Z9010 Acquired absence of unspecified breast and nipple: Secondary | ICD-10-CM | POA: Insufficient documentation

## 2013-05-15 DIAGNOSIS — C50911 Malignant neoplasm of unspecified site of right female breast: Secondary | ICD-10-CM

## 2013-05-15 DIAGNOSIS — T451X5A Adverse effect of antineoplastic and immunosuppressive drugs, initial encounter: Secondary | ICD-10-CM | POA: Insufficient documentation

## 2013-05-15 DIAGNOSIS — R112 Nausea with vomiting, unspecified: Secondary | ICD-10-CM | POA: Insufficient documentation

## 2013-05-15 DIAGNOSIS — D649 Anemia, unspecified: Secondary | ICD-10-CM | POA: Insufficient documentation

## 2013-05-15 DIAGNOSIS — C773 Secondary and unspecified malignant neoplasm of axilla and upper limb lymph nodes: Secondary | ICD-10-CM

## 2013-05-15 DIAGNOSIS — D61810 Antineoplastic chemotherapy induced pancytopenia: Secondary | ICD-10-CM | POA: Insufficient documentation

## 2013-05-15 DIAGNOSIS — D696 Thrombocytopenia, unspecified: Secondary | ICD-10-CM | POA: Insufficient documentation

## 2013-05-15 DIAGNOSIS — E876 Hypokalemia: Secondary | ICD-10-CM | POA: Insufficient documentation

## 2013-05-15 DIAGNOSIS — C50919 Malignant neoplasm of unspecified site of unspecified female breast: Secondary | ICD-10-CM | POA: Insufficient documentation

## 2013-05-15 DIAGNOSIS — Z885 Allergy status to narcotic agent status: Secondary | ICD-10-CM | POA: Insufficient documentation

## 2013-05-15 DIAGNOSIS — IMO0002 Reserved for concepts with insufficient information to code with codable children: Secondary | ICD-10-CM | POA: Insufficient documentation

## 2013-05-15 DIAGNOSIS — C50912 Malignant neoplasm of unspecified site of left female breast: Secondary | ICD-10-CM

## 2013-05-15 DIAGNOSIS — D709 Neutropenia, unspecified: Secondary | ICD-10-CM | POA: Insufficient documentation

## 2013-05-15 DIAGNOSIS — I890 Lymphedema, not elsewhere classified: Secondary | ICD-10-CM | POA: Insufficient documentation

## 2013-05-15 DIAGNOSIS — I10 Essential (primary) hypertension: Secondary | ICD-10-CM | POA: Insufficient documentation

## 2013-05-15 DIAGNOSIS — Z5112 Encounter for antineoplastic immunotherapy: Secondary | ICD-10-CM

## 2013-05-15 LAB — CBC WITH DIFFERENTIAL/PLATELET
Basophils Relative: 0 % (ref 0–1)
Hemoglobin: 12.6 g/dL (ref 12.0–15.0)
Lymphocytes Relative: 24 % (ref 12–46)
MCHC: 34.1 g/dL (ref 30.0–36.0)
Monocytes Relative: 6 % (ref 3–12)
Neutro Abs: 5.2 10*3/uL (ref 1.7–7.7)
Neutrophils Relative %: 67 % (ref 43–77)
RBC: 3.68 MIL/uL — ABNORMAL LOW (ref 3.87–5.11)
WBC: 7.7 10*3/uL (ref 4.0–10.5)

## 2013-05-15 LAB — COMPREHENSIVE METABOLIC PANEL
AST: 11 U/L (ref 0–37)
Albumin: 3.9 g/dL (ref 3.5–5.2)
Alkaline Phosphatase: 68 U/L (ref 39–117)
BUN: 22 mg/dL (ref 6–23)
CO2: 25 mEq/L (ref 19–32)
Chloride: 100 mEq/L (ref 96–112)
Potassium: 4.1 mEq/L (ref 3.5–5.1)
Total Bilirubin: 0.2 mg/dL — ABNORMAL LOW (ref 0.3–1.2)

## 2013-05-15 MED ORDER — HEPARIN SOD (PORK) LOCK FLUSH 100 UNIT/ML IV SOLN
INTRAVENOUS | Status: AC
  Filled 2013-05-15: qty 5

## 2013-05-15 MED ORDER — HEPARIN SOD (PORK) LOCK FLUSH 100 UNIT/ML IV SOLN
500.0000 [IU] | Freq: Once | INTRAVENOUS | Status: AC | PRN
  Administered 2013-05-15: 500 [IU]
  Filled 2013-05-15: qty 5

## 2013-05-15 MED ORDER — DIPHENHYDRAMINE HCL 25 MG PO CAPS
50.0000 mg | ORAL_CAPSULE | Freq: Once | ORAL | Status: AC
  Administered 2013-05-15: 50 mg via ORAL

## 2013-05-15 MED ORDER — ACETAMINOPHEN 325 MG PO TABS
ORAL_TABLET | ORAL | Status: AC
  Filled 2013-05-15: qty 2

## 2013-05-15 MED ORDER — ACETAMINOPHEN 325 MG PO TABS
650.0000 mg | ORAL_TABLET | Freq: Once | ORAL | Status: AC
  Administered 2013-05-15: 650 mg via ORAL

## 2013-05-15 MED ORDER — TRASTUZUMAB CHEMO INJECTION 440 MG
6.0000 mg/kg | Freq: Once | INTRAVENOUS | Status: AC
  Administered 2013-05-15: 546 mg via INTRAVENOUS
  Filled 2013-05-15: qty 26

## 2013-05-15 MED ORDER — SODIUM CHLORIDE 0.9 % IV SOLN
420.0000 mg | Freq: Once | INTRAVENOUS | Status: AC
  Administered 2013-05-15: 420 mg via INTRAVENOUS
  Filled 2013-05-15: qty 14

## 2013-05-15 MED ORDER — DIPHENHYDRAMINE HCL 25 MG PO CAPS
ORAL_CAPSULE | ORAL | Status: AC
  Filled 2013-05-15: qty 2

## 2013-05-15 MED ORDER — SODIUM CHLORIDE 0.9 % IJ SOLN
10.0000 mL | INTRAMUSCULAR | Status: DC | PRN
  Administered 2013-05-15: 10 mL
  Filled 2013-05-15: qty 10

## 2013-05-15 MED ORDER — SODIUM CHLORIDE 0.9 % IV SOLN
Freq: Once | INTRAVENOUS | Status: AC
  Administered 2013-05-15: 11:00:00 via INTRAVENOUS

## 2013-05-15 NOTE — Progress Notes (Signed)
[facility] Telephone:([phone]) [phone]   Fax:([phone]) [phone]  OFFICE PROGRESS NOTE  No primary provider on file. No primary provider on file.  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  Right Breast ER/PR + Her-2-neu negative.  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 Estrogen Receptor: 0% NEGATIVE Progesterone Receptor : 0%, NEGATIVE Proliferation Marker Ki67 by M IB-1 (Low<20%): 70%   She had excisional biopsy of the left breast and 10/20/2012. Breast, excision, left - INVASIVE MAMMARY CARCINOMA WITH A PREDOMINANTLY LOBULAR PHENOTYPE, GRADE III/III, SPANNING 4.0 CM. 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 - LYMPHOVASCULAR INVASION IS IDENTIFIED. - INVASIVE CARCINOMA IS BROADLY PRESENT AT NON-ORIENTED, INKED TISSUE EDGE Estrogen Receptor 88%,  Progesterone Receptor17%, Ki67 by M IB-1 (Low<20%): 61% She has been treated with carboplatin/docetaxel x 6 cycles (10/23/2012- 02/12/2013) plus Herceptin which will continue for 52 weeks. She continues on 3 weekly Herceptin and after her last received decision was made to add Pertuzumab to the regimen.  As documented in her last visit 02/15/2013;Herceptin was recently held due to development of edema. 2D echo was performed on 01/24/2013 and EF was noted to be stable at 60-65%. As a result, Herceptin was re-initiated.   I saw her last on 03/26/2013 at that time I had referred her to radiation oncology/surgery for consideration of local therapy.    INTERVAL HISTORY:   Erika Nunez 62 y.o. female returns to the clinic today for follow up of her recent bilateral simple mastectomy on 04/25/2013. Pathology showed;  1. Breast, simple mastectomy, left - FIBROSIS  WITH INFLAMMATION AND GIANT CELL REACTION CONSISTENT WITH PREVIOUS EXCISION. - NO RESIDUAL CARCINOMA IDENTIFIED. 2. Breast, simple mastectomy, right - FIBROSIS WITH INFLAMMATION AND GIANT CELL REACTION CONSISTENT WITH PREVIOUS EXCISION. - NO RESIDUAL CARCINOMA IDENTIFIED.  She is recovering well from surgery.  The wound on the left breast is heeling well without problems but the one on the right has partial wound dehiscence. She is otherwise tolerating her chemotherapy well.  She denies shortness of breath, leg swelling, orthopnea or NPD.   MEDICAL HISTORY: Past Medical History  Diagnosis Date  . Hypertension   . High cholesterol   . Cancer   . Breast cancer   . Allergy     seasonal allergies  . Arthritis   . Anxiety   . Breast mass, left 2014  . Breast mass, right 2014  . 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 Depression   . Lymphedema of arm     right arm    SURGICAL HISTORY:  Past Surgical History  Procedure Laterality Date  . Cholecystectomy    . Tubal ligation    . Tonsillectomy    . Breast biopsy  10/02/2012    Procedure: BREAST BIOPSY;  Surgeon: Fabio Bering, MD;  Location: [facility];  Service: General;  Laterality: Right;  . Portacath placement Left 10/20/2012  . Breast biopsy Left 10/20/2012    Procedure: BREAST BIOPSY;  Surgeon: Fabio Bering, MD;  Location: [facility];  Service: General;  Laterality: Left;  Procedure end 1142  . Portacath placement Left 10/20/2012    Procedure: INSERTION PORT-A-CATH;  Surgeon: Fabio Bering, MD;  Location: [facility];  Service: General;  Laterality: Left;  Procedure began 1143; left subclavian  .  Simple mastectomy with axillary sentinel node biopsy Bilateral 04/25/2013    Procedure: SIMPLE MASTECTOMY;  Surgeon: Fabio Bering, MD;  Location: [facility];  Service: General;  Laterality: Bilateral;     REVIEW OF SYSTEMS: 14 point review of system is as in the history above otherwise negative.  PHYSICAL EXAMINATION:    Blood pressure 114/57, pulse 84, temperature 97.7 F (36.5 C), temperature source Oral, resp. rate 18, weight 183 lb (83.008 kg). GENERAL: No acute distress. Alopecia. SKIN:  No rashes or significant lesions . No ecchymosis or petechial rash. HEAD: Normocephalic, No masses, lesions, tenderness or abnormalities  EYES: Conjunctiva are pink and non-injected and no jaundice ENT: External ears normal ,lips, buccal mucosa, and tongue normal and mucous membranes are moist . No evidence of thrush. LYMPH: No palpable lymphadenopathy, in the neck, supraclavicular areas or axilla. BREAST:latter mastectomy scars.  The left appears to be healing well but the right has a an area of wound dehiscence approximately an inch with a pocket of granulation tissue. LUNGS: Clear to auscultation , no crackles or wheezes HEART: regular rate & rhythm, no murmurs, no gallops, S normal and S normal and no S. ABDOMEN: Abdomen soft, non-tender, no masses or organomegaly and no hepatosplenomegaly palpable EXTREMITIES: No edema, no skin discoloration or tenderness NEURO: Alert & oriented .     LABORATORY DATA: Lab Results  Component Value Date   WBC 7.7 05/15/2013   HGB 12.6 05/15/2013   HCT 36.9 05/15/2013   MCV 100.3* 05/15/2013   PLT 280 05/15/2013      Chemistry      Component Value Date/Time   NA 137 05/15/2013 0853   K 4.1 05/15/2013 0853   CL 100 05/15/2013 0853   CO2 25 05/15/2013 0853   BUN 22 05/15/2013 0853   CREATININE 1.46* 05/15/2013 0853      Component Value Date/Time   CALCIUM 9.9 05/15/2013 0853   ALKPHOS 68 05/15/2013 0853   AST 11 05/15/2013 0853   ALT 7 05/15/2013 0853   BILITOT 0.2* 05/15/2013 0853       RADIOGRAPHIC STUDIES: No results found.   ASSESSMENT:  S/p B/l mastectomy for local regionally advanced breast cancer after responding well to [facility] /Pertuzumab. She appears to have bilateral breast cancer, mastectomy pathology does not show any residual cancer in the specimen  PLAN:  1. Consider breast  irradiation.  Patient referred to radiation oncology. 2. I will recommend a total of 52 weeks of adjuvant antibody therapy with Herceptin provided there is no measurable or obvious sites of disease. A case can be made for adding Pertuzumab given suspected   Metastasis which is not clearly proven.  3. Return to clinic 4 weeks 4. Continue antibody therapy. 5. She might be a candidate for consideration on BRCA 1& 2 testing.    All questions were satisfactorily answered. Patient knows to call if  any concern arises.  I spent more than 50 % counseling the patient face to face. The total time spent in the appointment was 30 minutes.   Sherral Hammers, MD FACP. Hematology/Oncology.

## 2013-05-15 NOTE — Progress Notes (Signed)
Tolerated chemo well today.   Discharge home accompanied by family member.

## 2013-05-15 NOTE — Patient Instructions (Signed)
.  [facility] Discharge Instructions  RECOMMENDATIONS MADE BY THE CONSULTANT AND ANY TEST RESULTS WILL BE SENT TO YOUR REFERRING PHYSICIAN.  EXAM FINDINGS BY THE PHYSICIAN TODAY AND SIGNS OR SYMPTOMS TO REPORT TO CLINIC OR PRIMARY PHYSICIAN:  Exam today per Dr. Beckie Busing  Discussed possibility of radiation, referral will be made.  Dr. Beckie Busing discussed use of perjeta with herceptin without evidence of widespread metastatic disease-- not studied yet. Unknown if it adds any benefit.  INSTRUCTIONS GIVEN AND DISCUSSED: Dr. Beckie Busing suggests that wound on right chest wall be packed from inside out. Patient is aware of this process and daughter who is a nurse has performed this for her before. Call with any questions.  SPECIAL INSTRUCTIONS/FOLLOW-UP: MD visit in 3 weeks  Thank you for choosing [facility] to provide your oncology and hematology care.  To afford each patient quality time with our providers, please arrive at least 15 minutes before your scheduled appointment time.  With your help, our goal is to use those 15 minutes to complete the necessary work-up to ensure our physicians have the information they need to help with your evaluation and healthcare recommendations.    Effective January 1st, 2014, we ask that you re-schedule your appointment with our physicians should you arrive 10 or more minutes late for your appointment.  We strive to give you quality time with our providers, and arriving late affects you and other patients whose appointments are after yours.    Again, thank you for choosing [facility].  Our hope is that these requests will decrease the amount of time that you wait before being seen by our physicians.       _____________________________________________________________  Should you have questions after your visit to [facility], please contact our office at [phone] between the hours of 8:30 a.m. and 5:00 p.m.   Voicemails left after 4:30 p.m. will not be returned until the following business day.  For prescription refill requests, have your pharmacy contact our office with your prescription refill request.

## 2013-05-16 ENCOUNTER — Telehealth (HOSPITAL_COMMUNITY): Payer: Self-pay | Admitting: *Deleted

## 2013-05-16 NOTE — Telephone Encounter (Signed)
Erika Nunez has an appointment with Dr. Wynonia Hazard at [facility] on 05/22/13 at 7:15 AM..Marland KitchenLeft message on her voice mail and my phone # to call if she did not understand the message.

## 2013-06-02 NOTE — Progress Notes (Signed)
[facility] OFFICE PROGRESS NOTE  No primary provider on file. No primary provider on file.  DIAGNOSIS: Breast cancer, unspecified laterality  No chief complaint on file.   CURRENT THERAPY: Trastuzumab/rituximab IV every 3 weeks x4  INTERVAL HISTORY: Erika Nunez 62 y.o. female returns for continuation of adjuvant therapy for locally advanced HER-2 positive ER positive breast cancer, to receive cycle #5 of protrusion at trastuzumab today. Even though her surgical resection specimen was ER and PR positive, the patient is not on any antiestrogen therapy. She continues to have closure of her surgical wound in the right anterior chest wall. She also suffers from +1 to +2 right upper extremity lymphedema. She denies a sore throat, cough, wheezing, PND, orthopnea, palpitations, lower extremity swelling or redness, skin rash, fever, night sweats, or headache.  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  Estrogen Receptor: 0% NEGATIVE  Progesterone Receptor : 0%, NEGATIVE  Proliferation Marker Ki67 by M IB-1 (Low<20%): 70%  She had excisional biopsy of the left breast and 10/20/2012.  Breast, excision, left  - INVASIVE MAMMARY CARCINOMA WITH A PREDOMINANTLY LOBULAR PHENOTYPE, GRADE III/III, SPANNING 4.0  CM.  - LOBULAR CARCINOMA IN SITU.  - DUCTAL CARCINOMA IN SITU.  - LYMPHOVASCULAR INVASION IS IDENTIFIED.  - INVASIVE CARCINOMA IS BROADLY PRESENT AT NON-ORIENTED, INKED TISSUE EDGE  Estrogen Receptor 88%,  Progesterone Receptor17%,  Ki67 by M IB-1 (Low<20%): 61%  She has been treated with carboplatin/docetaxel x 6 cycles (10/23/2012- 02/12/2013) plus Herceptin which will continue for 52 weeks. She continues on 3 weekly Herceptin and after her last received decision was made  to add Pertuzumab to the regimen.  As documented in her last visit 02/15/2013;Herceptin was recently held due to development of edema. 2D echo was performed on 01/24/2013 and EF was noted to be stable at 60-65%. As a result, Herceptin was re-initiated.  I saw her last on 03/26/2013 at that time I had referred her to radiation oncology/surgery for consideration of local therapy.  MEDICAL HISTORY: Past Medical History  Diagnosis Date  . Hypertension   . High cholesterol   . Cancer   . Breast cancer   . Allergy     seasonal allergies  . Arthritis   . Anxiety   . Breast mass, left 2014  . Breast mass, right 2014  . 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 Depression   . Lymphedema of arm     right arm    INTERIM HISTORY: has Breast cancer; Cellulitis of right upper extremity; Hypertension; Hypokalemia; Nausea with vomiting; Neutropenia; Anemia; Thrombocytopenia, unspecified; and Antineoplastic chemotherapy induced pancytopenia on her problem list.    ALLERGIES:  is allergic to codeine.  MEDICATIONS: has a current medication list which includes the following prescription(s): cephalexin, hydrocodone-acetaminophen, ibuprofen, lidocaine-prilocaine, lisinopril, lisinopril-hydrochlorothiazide, lorazepam, magnesium oxide, ondansetron, potassium chloride sa, pravastatin, prochlorperazine, and trastuzumab.  SURGICAL HISTORY:  Past Surgical History  Procedure Laterality Date  . Cholecystectomy    . Tubal ligation    . Tonsillectomy    . Breast biopsy  10/02/2012    Procedure: BREAST BIOPSY;  Surgeon: Fabio Bering, MD;  Location: [facility];  Service: General;  Laterality: Right;  . Portacath placement Left 10/20/2012  . Breast biopsy Left 10/20/2012    Procedure: BREAST BIOPSY;  Surgeon: Fabio Bering, MD;  Location:  [facility];  Service: General;  Laterality: Left;  Procedure end 1142  . Portacath placement Left 10/20/2012    Procedure: INSERTION PORT-A-CATH;  Surgeon: Fabio Bering, MD;  Location: [facility];  Service: General;  Laterality: Left;  Procedure began 1143; left subclavian  . Simple mastectomy with axillary sentinel node biopsy Bilateral 04/25/2013    Procedure: SIMPLE MASTECTOMY;  Surgeon: Fabio Bering, MD;  Location: [facility];  Service: General;  Laterality: Bilateral;    FAMILY HISTORY: family history includes COPD in her daughter; Cancer in her maternal aunt, maternal aunt, and maternal grandmother; Heart attack in her father; Stroke in her maternal uncle.  SOCIAL HISTORY:  reports that she has never smoked. She has never used smokeless tobacco. She reports that she drinks about 0.6 ounces of alcohol per week. She reports that she does not use illicit drugs.  REVIEW OF SYSTEMS:  Other than that discussed above is noncontributory.  PHYSICAL EXAMINATION: ECOG PERFORMANCE STATUS: 0 - Asymptomatic  There were no vitals taken for this visit.  GENERAL:alert, no distress and comfortable SKIN: skin color, texture, turgor are normal, no rashes or significant lesions EYES: normal, Conjunctiva are pink and non-injected, sclera clear OROPHARYNX:no exudate, no erythema and lips, buccal mucosa, and tongue normal  NECK: supple, thyroid normal size, non-tender, without nodularity CHEST: Right chest wall status post mastectomy with non-closure of surgical wound measuring approximately 2 cm. There is no evidence of purulence or hemorrhage. Status post left breast mastectomy with no subcutaneous nodules. LYMPH:  no palpable lymphadenopathy in the cervical, axillary or inguinal LUNGS: clear to auscultation and percussion with normal breathing effort HEART: regular rate & rhythm and no murmurs and no lower extremity edema ABDOMEN:abdomen soft, non-tender and normal bowel sounds Musculoskeletal:no cyanosis of digits and no clubbing. Right upper extremity +2 lymphedema.  NEURO: alert & oriented x 3 with fluent speech, no focal motor/sensory deficits   LABORATORY  DATA: Infusion on 05/15/2013  Component Date Value Range Status  . WBC 05/15/2013 7.7  4.0 - 10.5 K/uL Final  . RBC 05/15/2013 3.68* 3.87 - 5.11 MIL/uL Final  . Hemoglobin 05/15/2013 12.6  12.0 - 15.0 g/dL Final  . HCT 16/06/9603 36.9  36.0 - 46.0 % Final  . MCV 05/15/2013 100.3* 78.0 - 100.0 fL Final  . MCH 05/15/2013 34.2* 26.0 - 34.0 pg Final  . MCHC 05/15/2013 34.1  30.0 - 36.0 g/dL Final  . RDW 54/05/8118 12.3  11.5 - 15.5 % Final  . Platelets 05/15/2013 280  150 - 400 K/uL Final  . Neutrophils Relative % 05/15/2013 67  43 - 77 % Final  . Neutro Abs 05/15/2013 5.2  1.7 - 7.7 K/uL Final  . Lymphocytes Relative 05/15/2013 24  12 - 46 % Final  . Lymphs Abs 05/15/2013 1.9  0.7 - 4.0 K/uL Final  . Monocytes Relative 05/15/2013 6  3 - 12 % Final  . Monocytes Absolute 05/15/2013 0.5  0.1 - 1.0 K/uL Final  . Eosinophils Relative 05/15/2013 2  0 - 5 % Final  . Eosinophils Absolute 05/15/2013 0.2  0.0 - 0.7 K/uL Final  . Basophils Relative 05/15/2013 0  0 - 1 % Final  . Basophils Absolute 05/15/2013 0.0  0.0 - 0.1 K/uL Final  . Sodium 05/15/2013 137  135 - 145 mEq/L Final  . Potassium 05/15/2013 4.1  3.5 - 5.1 mEq/L Final  . Chloride 05/15/2013 100  96 - 112 mEq/L Final  . CO2 05/15/2013 25  19 - 32 mEq/L  Final  . Glucose, Bld 05/15/2013 93  70 - 99 mg/dL Final  . BUN 16/06/9603 22  6 - 23 mg/dL Final  . Creatinine, Ser 05/15/2013 1.46* 0.50 - 1.10 mg/dL Final  . Calcium 54/05/8118 9.9  8.4 - 10.5 mg/dL Final  . Total Protein 05/15/2013 7.1  6.0 - 8.3 g/dL Final  . Albumin 14/78/2956 3.9  3.5 - 5.2 g/dL Final  . AST 21/30/8657 11  0 - 37 U/L Final  . ALT 05/15/2013 7  0 - 35 U/L Final  . Alkaline Phosphatase 05/15/2013 68  39 - 117 U/L Final  . Total Bilirubin 05/15/2013 0.2* 0.3 - 1.2 mg/dL Final  . GFR calc non Af Amer 05/15/2013 37* >90 mL/min Final  . GFR calc Af Amer 05/15/2013 43* >90 mL/min Final   Comment: (NOTE)                          The eGFR has been calculated using  the CKD EPI equation.                          This calculation has not been validated in all clinical situations.                          eGFR's persistently <90 mL/min signify possible Chronic Kidney                          Disease.     Urinalysis No results found for this basename: colorurine,  appearanceur,  labspec,  phurine,  glucoseu,  hgbur,  bilirubinur,  ketonesur,  proteinur,  urobilinogen,  nitrite,  leukocytesur    RADIOGRAPHIC STUDIES: Ejection fraction was performed on 04/18/2013 with normal values.  ASSESSMENT: #1. Locally advanced right breast cancer, status post neoadjuvant treatment with TCH x6 followed by right modified radical mastectomy and left simple mastectomy, currently receiving protrusion mat and trastuzumab intravenously every 3 weeks with plans to initiate radiotherapy on 06/26/2013 if chest wall healed. #2. ER positive disease, currently not on any endocrine treatment. #3. Right upper extremity lymphedema, for referral to physical therapy for lymphedema intervention prior to the initiation of radiation.   PLAN: #1. Trastuzumab/Pertuzumab at IV today. #2. Referral to physical therapy for lymphedema prophylaxis and therapy. #3 initiation of anastrozole 1 mg daily in conjunction with current antibody therapy. #4. Followup in 3 weeks.   All questions were answered. The patient knows to call the clinic with any problems, questions or concerns. We can certainly see the patient much sooner if necessary.  The patient and plan discussed with Alla German A and he is in agreement with the aforementioned.  I spent 40 minutes counseling the patient face to face. The total time spent in the appointment was 30 minutes.    Maurilio Lovely, MD 06/02/2013 9:27 AM

## 2013-06-05 ENCOUNTER — Encounter (HOSPITAL_COMMUNITY): Payer: Medicaid Other

## 2013-06-05 ENCOUNTER — Encounter (HOSPITAL_COMMUNITY): Payer: Self-pay

## 2013-06-05 ENCOUNTER — Encounter (HOSPITAL_BASED_OUTPATIENT_CLINIC_OR_DEPARTMENT_OTHER): Payer: Medicaid Other

## 2013-06-05 VITALS — BP 122/70 | HR 76 | Temp 97.6°F | Resp 18 | Wt 183.6 lb

## 2013-06-05 DIAGNOSIS — Z481 Encounter for planned postprocedural wound closure: Secondary | ICD-10-CM | POA: Insufficient documentation

## 2013-06-05 DIAGNOSIS — C50911 Malignant neoplasm of unspecified site of right female breast: Secondary | ICD-10-CM

## 2013-06-05 DIAGNOSIS — C773 Secondary and unspecified malignant neoplasm of axilla and upper limb lymph nodes: Secondary | ICD-10-CM

## 2013-06-05 DIAGNOSIS — E8989 Other postprocedural endocrine and metabolic complications and disorders: Secondary | ICD-10-CM

## 2013-06-05 DIAGNOSIS — C50919 Malignant neoplasm of unspecified site of unspecified female breast: Secondary | ICD-10-CM

## 2013-06-05 DIAGNOSIS — Z5112 Encounter for antineoplastic immunotherapy: Secondary | ICD-10-CM

## 2013-06-05 DIAGNOSIS — I890 Lymphedema, not elsewhere classified: Secondary | ICD-10-CM

## 2013-06-05 LAB — BASIC METABOLIC PANEL
Calcium: 9.5 mg/dL (ref 8.4–10.5)
GFR calc non Af Amer: 45 mL/min — ABNORMAL LOW (ref >90)
Sodium: 136 mEq/L (ref 135–145)

## 2013-06-05 LAB — CBC WITH DIFFERENTIAL/PLATELET
Basophils Absolute: 0 10*3/uL (ref 0.0–0.1)
Basophils Relative: 0 % (ref 0–1)
Eosinophils Absolute: 0.3 10*3/uL (ref 0.0–0.7)
Eosinophils Relative: 4 % (ref 0–5)
MCH: 33.9 pg (ref 26.0–34.0)
MCV: 95.3 fL (ref 78.0–100.0)
Platelets: 207 10*3/uL (ref 150–400)
RDW: 12.7 % (ref 11.5–15.5)

## 2013-06-05 MED ORDER — HEPARIN SOD (PORK) LOCK FLUSH 100 UNIT/ML IV SOLN
500.0000 [IU] | Freq: Once | INTRAVENOUS | Status: AC | PRN
  Administered 2013-06-05: 500 [IU]

## 2013-06-05 MED ORDER — SODIUM CHLORIDE 0.9 % IJ SOLN
10.0000 mL | INTRAMUSCULAR | Status: DC | PRN
  Administered 2013-06-05: 10 mL

## 2013-06-05 MED ORDER — ANASTROZOLE 1 MG PO TABS: 1.0000 mg | ORAL_TABLET | Freq: Every day | ORAL | Status: DC

## 2013-06-05 MED ORDER — ACETAMINOPHEN 325 MG PO TABS
650.0000 mg | ORAL_TABLET | Freq: Once | ORAL | Status: AC
  Administered 2013-06-05: 650 mg via ORAL

## 2013-06-05 MED ORDER — SODIUM CHLORIDE 0.9 % IV SOLN
420.0000 mg | Freq: Once | INTRAVENOUS | Status: AC
  Administered 2013-06-05: 420 mg via INTRAVENOUS
  Filled 2013-06-05: qty 14

## 2013-06-05 MED ORDER — HEPARIN SOD (PORK) LOCK FLUSH 100 UNIT/ML IV SOLN
INTRAVENOUS | Status: AC
  Filled 2013-06-05: qty 5

## 2013-06-05 MED ORDER — TRASTUZUMAB CHEMO INJECTION 440 MG
6.0000 mg/kg | Freq: Once | INTRAVENOUS | Status: AC
  Administered 2013-06-05: 504 mg via INTRAVENOUS
  Filled 2013-06-05: qty 24

## 2013-06-05 MED ORDER — DIPHENHYDRAMINE HCL 25 MG PO CAPS
50.0000 mg | ORAL_CAPSULE | Freq: Once | ORAL | Status: AC
  Administered 2013-06-05: 50 mg via ORAL

## 2013-06-05 MED ORDER — SODIUM CHLORIDE 0.9 % IV SOLN
Freq: Once | INTRAVENOUS | Status: AC
  Administered 2013-06-05: 10:00:00 via INTRAVENOUS

## 2013-06-05 MED ORDER — ACETAMINOPHEN 325 MG PO TABS
ORAL_TABLET | ORAL | Status: AC
  Filled 2013-06-05: qty 2

## 2013-06-05 MED ORDER — DIPHENHYDRAMINE HCL 25 MG PO CAPS
ORAL_CAPSULE | ORAL | Status: AC
  Filled 2013-06-05: qty 2

## 2013-06-05 NOTE — Patient Instructions (Addendum)
[facility] Discharge Instructions  RECOMMENDATIONS MADE BY THE CONSULTANT AND ANY TEST RESULTS WILL BE SENT TO YOUR REFERRING PHYSICIAN.  Continue with current chemotherapy treatment plan. Return to clinic in 3 weeks to see MD on treatment day. A prescription was sent to your pharmacy for Anastrozole. This is an anti-estrogen pill that you will take for a total of 5 years. Take as directed. Report any issues/concerns as needed to clinic. Anastrozole tablets What is this medicine? ANASTROZOLE (an AS troe zole) is used to treat breast cancer in women who have gone through menopause. Some types of breast cancer depend on estrogen to grow, and this medicine can stop tumor growth by blocking estrogen production. This medicine may be used for other purposes; ask your health care provider or pharmacist if you have questions. What should I tell my health care provider before I take this medicine? They need to know if you have any of these conditions: -liver disease -an unusual or allergic reaction to anastrozole, other medicines, foods, dyes, or preservatives -pregnant or trying to get pregnant -breast-feeding How should I use this medicine? Take this medicine by mouth with a glass of water. Follow the directions on the prescription label. You can take this medicine with or without food. Take your doses at regular intervals. Do not take your medicine more often than directed. Do not stop taking except on the advice of your doctor or health care professional. Talk to your pediatrician regarding the use of this medicine in children. Special care may be needed. Overdosage: If you think you have taken too much of this medicine contact a poison control center or emergency room at once. NOTE: This medicine is only for you. Do not share this medicine with others. What if I miss a dose? If you miss a dose, take it as soon as you can. If it is almost time for your next dose, take only  that dose. Do not take double or extra doses. What may interact with this medicine? Do not take this medicine with any of the following medications: -female hormones, like estrogens or progestins and birth control pills This medicine may also interact with the following medications: -tamoxifen This list may not describe all possible interactions. Give your health care provider a list of all the medicines, herbs, non-prescription drugs, or dietary supplements you use. Also tell them if you smoke, drink alcohol, or use illegal drugs. Some items may interact with your medicine. What should I watch for while using this medicine? Visit your doctor or health care professional for regular checks on your progress. Let your doctor or health care professional know about any unusual vaginal bleeding. Do not treat yourself for diarrhea, nausea, vomiting or other side effects. Ask your doctor or health care professional for advice. What side effects may I notice from receiving this medicine? Side effects that you should report to your doctor or health care professional as soon as possible: -allergic reactions like skin rash, itching or hives, swelling of the face, lips, or tongue -any Gerardo Territo or unusual symptoms -breathing problems -chest pain -leg pain or swelling -vomiting Side effects that usually do not require medical attention (report to your doctor or health care professional if they continue or are bothersome): -back or bone pain -cough, or throat infection -diarrhea or constipation -dizziness -headache -hot flashes -loss of appetite -nausea -sweating -weakness and tiredness -weight gain This list may not describe all possible side effects. Call your doctor for medical advice about  side effects. You may report side effects to FDA at 1-800-FDA-1088. Where should I keep my medicine? Keep out of the reach of children. Store at room temperature between 20 and 25 degrees C (68 and 77 degrees F).  Throw away any unused medicine after the expiration date. NOTE: This sheet is a summary. It may not cover all possible information. If you have questions about this medicine, talk to your doctor, pharmacist, or health care provider.  2013, Elsevier/Gold Standard. (11/10/2007 4:31:52 PM)  Thank you for choosing [facility] to provide your oncology and hematology care.  To afford each patient quality time with our providers, please arrive at least 15 minutes before your scheduled appointment time.  With your help, our goal is to use those 15 minutes to complete the necessary work-up to ensure our physicians have the information they need to help with your evaluation and healthcare recommendations.    Effective January 1st, 2014, we ask that you re-schedule your appointment with our physicians should you arrive 10 or more minutes late for your appointment.  We strive to give you quality time with our providers, and arriving late affects you and other patients whose appointments are after yours.    Again, thank you for choosing [facility].  Our hope is that these requests will decrease the amount of time that you wait before being seen by our physicians.       _____________________________________________________________  Should you have questions after your visit to [facility], please contact our office at [phone] between the hours of 8:30 a.m. and 5:00 p.m.  Voicemails left after 4:30 p.m. will not be returned until the following business day.  For prescription refill requests, have your pharmacy contact our office with your prescription refill request.

## 2013-06-05 NOTE — Progress Notes (Signed)
Patient tolerated chemo well.  Seen by MD this am.

## 2013-06-06 ENCOUNTER — Other Ambulatory Visit (HOSPITAL_COMMUNITY): Payer: Self-pay | Admitting: Oncology

## 2013-06-06 DIAGNOSIS — C50911 Malignant neoplasm of unspecified site of right female breast: Secondary | ICD-10-CM

## 2013-06-06 MED ORDER — LORAZEPAM 1 MG PO TABS: 1.0000 mg | ORAL_TABLET | ORAL | Status: DC | PRN

## 2013-06-12 ENCOUNTER — Other Ambulatory Visit (HOSPITAL_COMMUNITY): Payer: Self-pay | Admitting: Oncology

## 2013-06-13 NOTE — Telephone Encounter (Signed)
Rx is ready and in drawer 

## 2013-06-26 ENCOUNTER — Encounter (HOSPITAL_BASED_OUTPATIENT_CLINIC_OR_DEPARTMENT_OTHER): Payer: Medicaid Other

## 2013-06-26 ENCOUNTER — Encounter (HOSPITAL_COMMUNITY): Payer: Self-pay

## 2013-06-26 ENCOUNTER — Encounter (HOSPITAL_COMMUNITY): Payer: Medicaid Other | Attending: Oncology

## 2013-06-26 ENCOUNTER — Encounter (HOSPITAL_COMMUNITY): Payer: Medicaid Other

## 2013-06-26 VITALS — BP 121/62 | HR 89 | Temp 98.0°F | Resp 16 | Wt 178.8 lb

## 2013-06-26 DIAGNOSIS — C50911 Malignant neoplasm of unspecified site of right female breast: Secondary | ICD-10-CM

## 2013-06-26 DIAGNOSIS — E8989 Other postprocedural endocrine and metabolic complications and disorders: Secondary | ICD-10-CM

## 2013-06-26 DIAGNOSIS — C50919 Malignant neoplasm of unspecified site of unspecified female breast: Secondary | ICD-10-CM

## 2013-06-26 DIAGNOSIS — I890 Lymphedema, not elsewhere classified: Secondary | ICD-10-CM

## 2013-06-26 DIAGNOSIS — T451X5A Adverse effect of antineoplastic and immunosuppressive drugs, initial encounter: Secondary | ICD-10-CM | POA: Insufficient documentation

## 2013-06-26 DIAGNOSIS — Z5112 Encounter for antineoplastic immunotherapy: Secondary | ICD-10-CM

## 2013-06-26 LAB — CBC WITH DIFFERENTIAL/PLATELET
Eosinophils Absolute: 0.2 10*3/uL (ref 0.0–0.7)
Lymphocytes Relative: 24 % (ref 12–46)
Lymphs Abs: 1.9 10*3/uL (ref 0.7–4.0)
MCH: 33.4 pg (ref 26.0–34.0)
Neutrophils Relative %: 64 % (ref 43–77)
Platelets: 234 10*3/uL (ref 150–400)
RBC: 3.89 MIL/uL (ref 3.87–5.11)
WBC: 7.7 10*3/uL (ref 4.0–10.5)

## 2013-06-26 LAB — COMPREHENSIVE METABOLIC PANEL
ALT: 8 U/L (ref 0–35)
AST: 12 U/L (ref 0–37)
Alkaline Phosphatase: 68 U/L (ref 39–117)
GFR calc Af Amer: 50 mL/min — ABNORMAL LOW (ref >90)
Glucose, Bld: 90 mg/dL (ref 70–99)
Potassium: 4.1 mEq/L (ref 3.5–5.1)
Sodium: 139 mEq/L (ref 135–145)
Total Protein: 7 g/dL (ref 6.0–8.3)

## 2013-06-26 MED ORDER — SODIUM CHLORIDE 0.9 % IV SOLN
Freq: Once | INTRAVENOUS | Status: AC
  Administered 2013-06-26: 09:00:00 via INTRAVENOUS

## 2013-06-26 MED ORDER — TRASTUZUMAB CHEMO INJECTION 440 MG
6.0000 mg/kg | Freq: Once | INTRAVENOUS | Status: AC
  Administered 2013-06-26: 504 mg via INTRAVENOUS
  Filled 2013-06-26: qty 24

## 2013-06-26 MED ORDER — SODIUM CHLORIDE 0.9 % IJ SOLN
10.0000 mL | INTRAMUSCULAR | Status: DC | PRN
  Administered 2013-06-26: 10 mL

## 2013-06-26 MED ORDER — SODIUM CHLORIDE 0.9 % IV SOLN
420.0000 mg | Freq: Once | INTRAVENOUS | Status: AC
  Administered 2013-06-26: 420 mg via INTRAVENOUS
  Filled 2013-06-26: qty 14

## 2013-06-26 MED ORDER — HEPARIN SOD (PORK) LOCK FLUSH 100 UNIT/ML IV SOLN
500.0000 [IU] | Freq: Once | INTRAVENOUS | Status: AC | PRN
  Administered 2013-06-26: 500 [IU]

## 2013-06-26 MED ORDER — HEPARIN SOD (PORK) LOCK FLUSH 100 UNIT/ML IV SOLN
INTRAVENOUS | Status: AC
  Filled 2013-06-26: qty 5

## 2013-06-26 NOTE — Progress Notes (Signed)
States took acetaminophen and benedryl at home prior to appt time. Tolerated chemo well.  Has appt with physician today.

## 2013-06-26 NOTE — Progress Notes (Signed)
Labs drawn today for cbc/diff,cmp 

## 2013-06-26 NOTE — Addendum Note (Signed)
Addended by: Alla German A on: 06/26/2013 03:40 PM   Modules accepted: Orders

## 2013-06-26 NOTE — Patient Instructions (Signed)
[facility] Discharge Instructions  RECOMMENDATIONS MADE BY THE CONSULTANT AND ANY TEST RESULTS WILL BE SENT TO YOUR REFERRING PHYSICIAN.  EXAM FINDINGS BY THE PHYSICIAN TODAY AND SIGNS OR SYMPTOMS TO REPORT TO CLINIC OR PRIMARY PHYSICIAN:   Return in 3 weeks to see Dr. Zigmund Daniel and for therapy   Thank you for choosing [facility] to provide your oncology and hematology care.  To afford each patient quality time with our providers, please arrive at least 15 minutes before your scheduled appointment time.  With your help, our goal is to use those 15 minutes to complete the necessary work-up to ensure our physicians have the information they need to help with your evaluation and healthcare recommendations.    Effective January 1st, 2014, we ask that you re-schedule your appointment with our physicians should you arrive 10 or more minutes late for your appointment.  We strive to give you quality time with our providers, and arriving late affects you and other patients whose appointments are after yours.    Again, thank you for choosing [facility].  Our hope is that these requests will decrease the amount of time that you wait before being seen by our physicians.       _____________________________________________________________  Should you have questions after your visit to [facility], please contact our office at [phone] between the hours of 8:30 a.m. and 5:00 p.m.  Voicemails left after 4:30 p.m. will not be returned until the following business day.  For prescription refill requests, have your pharmacy contact our office with your prescription refill request.

## 2013-06-26 NOTE — Progress Notes (Signed)
[facility] OFFICE PROGRESS NOTE  No primary provider on file. No primary provider on file.  DIAGNOSIS: Breast cancer, right  Lymphedema of upper extremity following lymphadenectomy  Chief Complaint  Patient presents with  . Breast Cancer    antibody therapy    CURRENT THERAPY: Pertuzumab/trastuzumab IV every [redacted] weeks along with anastrozole. To begin external beam radiotherapy next week  INTERVAL HISTORY: Erika Nunez 62 y.o. female returns for continuation of adjuvant therapy for HER-2 positive breast cancer now taking anastrozole 1 mg daily and receiving Pertuzumab/trastuzumab IV every 3 weeks, cycle number 6 administered today.. She is expressing hot flashes but not horrible. She also has some joint discomfort involving the back, knees, and feet. Appetite is good with no nausea, vomiting, PND, orthopnea, palpitations, lower extremity swelling or redness, vaginal dryness, vaginal bleeding, skin rash, headache, or seizures.  MEDICAL HISTORY: Past Medical History  Diagnosis Date  . Hypertension   . High cholesterol   . Cancer   . Breast cancer   . Allergy     seasonal allergies  . Arthritis   . Anxiety   . Breast mass, left 2014  . Breast mass, right 2014  . 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 Depression   . Lymphedema of arm     right arm    INTERIM HISTORY: has Breast cancer; Cellulitis of right upper extremity; Hypertension; Hypokalemia; Nausea with vomiting; Neutropenia; Anemia; Thrombocytopenia, unspecified; Antineoplastic chemotherapy induced pancytopenia; Lymphedema of upper extremity following lymphadenectomy; and Delayed postoperative wound closure on her problem list.  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    Estrogen Receptor: 0% NEGATIVE  Progesterone Receptor : 0%, NEGATIVE  Proliferation Marker Ki67 by M IB-1 (Low<20%): 70%  She had excisional biopsy of the left breast and 10/20/2012.  Breast, excision, left  - INVASIVE MAMMARY CARCINOMA WITH A PREDOMINANTLY LOBULAR PHENOTYPE, GRADE III/III, SPANNING 4.0  CM.  - LOBULAR CARCINOMA IN SITU.  - DUCTAL CARCINOMA IN SITU.  - LYMPHOVASCULAR INVASION IS IDENTIFIED.  - INVASIVE CARCINOMA IS BROADLY PRESENT AT NON-ORIENTED, INKED TISSUE EDGE  Estrogen Receptor 88%,  Progesterone Receptor17%,  Ki67 by M IB-1 (Low<20%): 61%  She has been treated with carboplatin/docetaxel x 6 cycles (10/23/2012- 02/12/2013) plus Herceptin which will continue for 52 weeks. She continues on 3 weekly Herceptin and after her last received decision was made to add Pertuzumab to the regimen.  As documented in her last visit 02/15/2013;Herceptin was recently held due to development of edema. 2D echo was performed on 01/24/2013 and EF was noted to be stable at 60-65%. As a result, Herceptin was re-initiated.  I saw her last on 03/26/2013 at that time I had referred her to radiation oncology/surgery for consideration of local therapy.  ALLERGIES:  is allergic to codeine.  MEDICATIONS: has a current medication list which includes the following prescription(s): anastrozole, hydrocodone-acetaminophen, ibuprofen, lidocaine-prilocaine, lisinopril, lisinopril-hydrochlorothiazide, lorazepam, magnesium oxide, ondansetron, potassium chloride sa, pravastatin, trastuzumab, and prochlorperazine, and the following Facility-Administered Medications: sodium chloride.  SURGICAL HISTORY:  Past Surgical History  Procedure Laterality Date  . Cholecystectomy    . Tubal ligation    . Tonsillectomy    . Breast biopsy  10/02/2012    Procedure: BREAST BIOPSY;  Surgeon: Fabio Bering, MD;  Location: [facility];  Service: General;  Laterality: Right;  .  Portacath placement Left 10/20/2012  . Breast biopsy  Left 10/20/2012    Procedure: BREAST BIOPSY;  Surgeon: Fabio Bering, MD;  Location: [facility];  Service: General;  Laterality: Left;  Procedure end 1142  . Portacath placement Left 10/20/2012    Procedure: INSERTION PORT-A-CATH;  Surgeon: Fabio Bering, MD;  Location: [facility];  Service: General;  Laterality: Left;  Procedure began 1143; left subclavian  . Simple mastectomy with axillary sentinel node biopsy Bilateral 04/25/2013    Procedure: SIMPLE MASTECTOMY;  Surgeon: Fabio Bering, MD;  Location: [facility];  Service: General;  Laterality: Bilateral;    FAMILY HISTORY: family history includes COPD in her daughter; Cancer in her maternal aunt, maternal aunt, and maternal grandmother; Heart attack in her father; Stroke in her maternal uncle.  SOCIAL HISTORY:  reports that she has never smoked. She has never used smokeless tobacco. She reports that she drinks about 0.6 ounces of alcohol per week. She reports that she does not use illicit drugs.  REVIEW OF SYSTEMS:  Other than that discussed above is noncontributory.  PHYSICAL EXAMINATION: ECOG PERFORMANCE STATUS: 1 - Symptomatic but completely ambulatory  There were no vitals taken for this visit.  GENERAL:alert, no distress and comfortable SKIN: skin color, texture, turgor are normal, no rashes or significant lesions. Alopecia resolving. EYES: PERLA; Conjunctiva are pink and non-injected, sclera clear OROPHARYNX:no exudate, no erythema on lips, buccal mucosa, or tongue. NECK: supple, thyroid normal size, non-tender, without nodularity. No masses CHEST: Status post right mastectomy with no subcutaneous nodules but with minimal non-closure of the surgical wound measuring about 1 cm. No evidence of purulence or hemorrhage. Left mastectomy with no subcutaneous nodules. LYMPH:  no palpable lymphadenopathy in the cervical, axillary or inguinal LUNGS: clear to auscultation and percussion with normal breathing effort HEART: regular rate & rhythm and no  murmurs and no lower extremity edema ABDOMEN:abdomen soft, non-tender and normal bowel sounds MUSCULOSKELETAL:no cyanosis of digits and no clubbing. Range of motion normal.  NEURO: alert & oriented x 3 with fluent speech, no focal motor/sensory deficits   LABORATORY DATA: Infusion on 06/26/2013  Component Date Value Range Status  . WBC 06/26/2013 7.7  4.0 - 10.5 K/uL Final  . RBC 06/26/2013 3.89  3.87 - 5.11 MIL/uL Final  . Hemoglobin 06/26/2013 13.0  12.0 - 15.0 g/dL Final  . HCT 82/95/6213 37.8  36.0 - 46.0 % Final  . MCV 06/26/2013 97.2  78.0 - 100.0 fL Final  . MCH 06/26/2013 33.4  26.0 - 34.0 pg Final  . MCHC 06/26/2013 34.4  30.0 - 36.0 g/dL Final  . RDW 08/65/7846 13.7  11.5 - 15.5 % Final  . Platelets 06/26/2013 234  150 - 400 K/uL Final  . Neutrophils Relative % 06/26/2013 64  43 - 77 % Final  . Neutro Abs 06/26/2013 4.9  1.7 - 7.7 K/uL Final  . Lymphocytes Relative 06/26/2013 24  12 - 46 % Final  . Lymphs Abs 06/26/2013 1.9  0.7 - 4.0 K/uL Final  . Monocytes Relative 06/26/2013 9  3 - 12 % Final  . Monocytes Absolute 06/26/2013 0.7  0.1 - 1.0 K/uL Final  . Eosinophils Relative 06/26/2013 3  0 - 5 % Final  . Eosinophils Absolute 06/26/2013 0.2  0.0 - 0.7 K/uL Final  . Basophils Relative 06/26/2013 0  0 - 1 % Final  . Basophils Absolute 06/26/2013 0.0  0.0 - 0.1 K/uL Final  . Sodium 06/26/2013 139  135 - 145 mEq/L Final  .  Potassium 06/26/2013 4.1  3.5 - 5.1 mEq/L Final  . Chloride 06/26/2013 104  96 - 112 mEq/L Final  . CO2 06/26/2013 22  19 - 32 mEq/L Final  . Glucose, Bld 06/26/2013 90  70 - 99 mg/dL Final  . BUN 86/57/8469 18  6 - 23 mg/dL Final  . Creatinine, Ser 06/26/2013 1.29* 0.50 - 1.10 mg/dL Final  . Calcium 62/95/2841 9.8  8.4 - 10.5 mg/dL Final  . Total Protein 06/26/2013 7.0  6.0 - 8.3 g/dL Final  . Albumin 32/44/0102 4.2  3.5 - 5.2 g/dL Final  . AST 72/53/6644 12  0 - 37 U/L Final  . ALT 06/26/2013 8  0 - 35 U/L Final  . Alkaline Phosphatase 06/26/2013  68  39 - 117 U/L Final  . Total Bilirubin 06/26/2013 0.4  0.3 - 1.2 mg/dL Final  . GFR calc non Af Amer 06/26/2013 43* >90 mL/min Final  . GFR calc Af Amer 06/26/2013 50* >90 mL/min Final   Comment: (NOTE)                          The eGFR has been calculated using the CKD EPI equation.                          This calculation has not been validated in all clinical situations.                          eGFR's persistently <90 mL/min signify possible Chronic Kidney                          Disease.  Infusion on 06/05/2013  Component Date Value Range Status  . WBC 06/05/2013 8.0  4.0 - 10.5 K/uL Final  . RBC 06/05/2013 3.86* 3.87 - 5.11 MIL/uL Final  . Hemoglobin 06/05/2013 13.1  12.0 - 15.0 g/dL Final  . HCT 03/47/4259 36.8  36.0 - 46.0 % Final  . MCV 06/05/2013 95.3  78.0 - 100.0 fL Final  . MCH 06/05/2013 33.9  26.0 - 34.0 pg Final  . MCHC 06/05/2013 35.6  30.0 - 36.0 g/dL Final  . RDW 56/38/7564 12.7  11.5 - 15.5 % Final  . Platelets 06/05/2013 207  150 - 400 K/uL Final  . Neutrophils Relative % 06/05/2013 67  43 - 77 % Final  . Neutro Abs 06/05/2013 5.4  1.7 - 7.7 K/uL Final  . Lymphocytes Relative 06/05/2013 22  12 - 46 % Final  . Lymphs Abs 06/05/2013 1.7  0.7 - 4.0 K/uL Final  . Monocytes Relative 06/05/2013 7  3 - 12 % Final  . Monocytes Absolute 06/05/2013 0.6  0.1 - 1.0 K/uL Final  . Eosinophils Relative 06/05/2013 4  0 - 5 % Final  . Eosinophils Absolute 06/05/2013 0.3  0.0 - 0.7 K/uL Final  . Basophils Relative 06/05/2013 0  0 - 1 % Final  . Basophils Absolute 06/05/2013 0.0  0.0 - 0.1 K/uL Final  . Sodium 06/05/2013 136  135 - 145 mEq/L Final  . Potassium 06/05/2013 3.3* 3.5 - 5.1 mEq/L Final  . Chloride 06/05/2013 100  96 - 112 mEq/L Final  . CO2 06/05/2013 24  19 - 32 mEq/L Final  . Glucose, Bld 06/05/2013 85  70 - 99 mg/dL Final  . BUN 33/29/5188 16  6 - 23 mg/dL Final  . Creatinine,  Ser 06/05/2013 1.24* 0.50 - 1.10 mg/dL Final  . Calcium 56/21/3086 9.5  8.4 - 10.5  mg/dL Final  . GFR calc non Af Amer 06/05/2013 45* >90 mL/min Final  . GFR calc Af Amer 06/05/2013 52* >90 mL/min Final   Comment: (NOTE)                          The eGFR has been calculated using the CKD EPI equation.                          This calculation has not been validated in all clinical situations.                          eGFR's persistently <90 mL/min signify possible Chronic Kidney                          Disease.    PATHOLOGY:  Urinalysis No results found for this basename: colorurine, appearanceur, labspec, phurine, glucoseu, hgbur, bilirubinur, ketonesur, proteinur, urobilinogen, nitrite, leukocytesur    RADIOGRAPHIC STUDIES: No results found.  ASSESSMENT: #1.Locally advanced right breast cancer, status post neoadjuvant treatment with TCH x6 followed by right modified radical mastectomy and left simple mastectomy, currently receiving protrusion mat and trastuzumab intravenously every 3 weeks with plans to initiate radiotherapy on 07/03/2013 if chest wall healed. #2. Right upper extremity lymphedema, going to physical therapy for prophylactic intervention to prevent worsening while radiation treatment is being delivered.   PLAN: #1. Intravenous trastuzumab and Pertuzumab today. #2. Anastrozole 1 mg daily. #3. Initiate radiotherapy on 07/03/2013 and coordinate appointments so that additional intravenous therapy can be administered in 3 weeks.   All questions were answered. The patient knows to call the clinic with any problems, questions or concerns. We can certainly see the patient much sooner if necessary.   I spent 25 minutes counseling the patient face to face. The total time spent in the appointment was 30 minutes.    Maurilio Lovely, MD 06/26/2013 12:51 PM

## 2013-07-17 ENCOUNTER — Encounter (HOSPITAL_BASED_OUTPATIENT_CLINIC_OR_DEPARTMENT_OTHER): Payer: Medicaid Other

## 2013-07-17 ENCOUNTER — Inpatient Hospital Stay (HOSPITAL_COMMUNITY): Payer: Medicaid Other

## 2013-07-17 ENCOUNTER — Encounter (HOSPITAL_COMMUNITY): Payer: Medicaid Other | Attending: Oncology

## 2013-07-17 ENCOUNTER — Encounter (HOSPITAL_COMMUNITY): Payer: Self-pay

## 2013-07-17 VITALS — BP 126/79 | HR 88 | Temp 98.3°F | Resp 18 | Wt 180.4 lb

## 2013-07-17 DIAGNOSIS — C50919 Malignant neoplasm of unspecified site of unspecified female breast: Secondary | ICD-10-CM

## 2013-07-17 DIAGNOSIS — T451X5A Adverse effect of antineoplastic and immunosuppressive drugs, initial encounter: Secondary | ICD-10-CM | POA: Insufficient documentation

## 2013-07-17 DIAGNOSIS — IMO0002 Reserved for concepts with insufficient information to code with codable children: Secondary | ICD-10-CM | POA: Insufficient documentation

## 2013-07-17 DIAGNOSIS — I10 Essential (primary) hypertension: Secondary | ICD-10-CM | POA: Insufficient documentation

## 2013-07-17 DIAGNOSIS — Z5112 Encounter for antineoplastic immunotherapy: Secondary | ICD-10-CM

## 2013-07-17 DIAGNOSIS — E876 Hypokalemia: Secondary | ICD-10-CM

## 2013-07-17 DIAGNOSIS — I890 Lymphedema, not elsewhere classified: Secondary | ICD-10-CM | POA: Insufficient documentation

## 2013-07-17 DIAGNOSIS — D696 Thrombocytopenia, unspecified: Secondary | ICD-10-CM | POA: Insufficient documentation

## 2013-07-17 DIAGNOSIS — C773 Secondary and unspecified malignant neoplasm of axilla and upper limb lymph nodes: Secondary | ICD-10-CM

## 2013-07-17 DIAGNOSIS — C50911 Malignant neoplasm of unspecified site of right female breast: Secondary | ICD-10-CM

## 2013-07-17 DIAGNOSIS — D709 Neutropenia, unspecified: Secondary | ICD-10-CM | POA: Insufficient documentation

## 2013-07-17 DIAGNOSIS — D61810 Antineoplastic chemotherapy induced pancytopenia: Secondary | ICD-10-CM | POA: Insufficient documentation

## 2013-07-17 DIAGNOSIS — D649 Anemia, unspecified: Secondary | ICD-10-CM | POA: Insufficient documentation

## 2013-07-17 DIAGNOSIS — R112 Nausea with vomiting, unspecified: Secondary | ICD-10-CM | POA: Insufficient documentation

## 2013-07-17 DIAGNOSIS — Z481 Encounter for planned postprocedural wound closure: Secondary | ICD-10-CM | POA: Insufficient documentation

## 2013-07-17 LAB — COMPREHENSIVE METABOLIC PANEL
BUN: 19 mg/dL (ref 6–23)
CO2: 27 mEq/L (ref 19–32)
Calcium: 9.8 mg/dL (ref 8.4–10.5)
Creatinine, Ser: 1.17 mg/dL — ABNORMAL HIGH (ref 0.50–1.10)
GFR calc Af Amer: 56 mL/min — ABNORMAL LOW (ref >90)
GFR calc non Af Amer: 49 mL/min — ABNORMAL LOW (ref >90)
Glucose, Bld: 131 mg/dL — ABNORMAL HIGH (ref 70–99)

## 2013-07-17 LAB — CBC WITH DIFFERENTIAL/PLATELET
Hemoglobin: 11.8 g/dL — ABNORMAL LOW (ref 12.0–15.0)
Lymphs Abs: 1.2 10*3/uL (ref 0.7–4.0)
Monocytes Relative: 11 % (ref 3–12)
Neutro Abs: 4.1 10*3/uL (ref 1.7–7.7)
Neutrophils Relative %: 66 % (ref 43–77)
Platelets: 230 10*3/uL (ref 150–400)
RBC: 3.5 MIL/uL — ABNORMAL LOW (ref 3.87–5.11)
WBC: 6.2 10*3/uL (ref 4.0–10.5)

## 2013-07-17 LAB — MAGNESIUM: Magnesium: 1.2 mg/dL — ABNORMAL LOW (ref 1.5–2.5)

## 2013-07-17 MED ORDER — SODIUM CHLORIDE 0.9 % IV SOLN
420.0000 mg | Freq: Once | INTRAVENOUS | Status: AC
  Administered 2013-07-17: 420 mg via INTRAVENOUS
  Filled 2013-07-17: qty 14

## 2013-07-17 MED ORDER — SODIUM CHLORIDE 0.9 % IJ SOLN
10.0000 mL | INTRAMUSCULAR | Status: DC | PRN
  Administered 2013-07-17: 10 mL

## 2013-07-17 MED ORDER — TRASTUZUMAB CHEMO INJECTION 440 MG
6.0000 mg/kg | Freq: Once | INTRAVENOUS | Status: AC
  Administered 2013-07-17: 504 mg via INTRAVENOUS
  Filled 2013-07-17: qty 24

## 2013-07-17 MED ORDER — HEPARIN SOD (PORK) LOCK FLUSH 100 UNIT/ML IV SOLN
500.0000 [IU] | Freq: Once | INTRAVENOUS | Status: AC | PRN
  Administered 2013-07-17: 500 [IU]
  Filled 2013-07-17: qty 5

## 2013-07-17 MED ORDER — SODIUM CHLORIDE 0.9 % IV SOLN
Freq: Once | INTRAVENOUS | Status: AC
  Administered 2013-07-17: 10:00:00 via INTRAVENOUS

## 2013-07-17 MED ORDER — POTASSIUM CHLORIDE CRYS ER 20 MEQ PO TBCR: 40.0000 meq | EXTENDED_RELEASE_TABLET | Freq: Four times a day (QID) | ORAL | Status: DC

## 2013-07-17 MED ORDER — MAGNESIUM OXIDE 400 (241.3 MG) MG PO TABS: 400.0000 mg | ORAL_TABLET | Freq: Three times a day (TID) | ORAL | Status: DC

## 2013-07-17 NOTE — Progress Notes (Signed)
Seen by MD today.  Right arm lymphadema found on exam.  Pt referrel to PT.  Pt did not complete chemo  In time for radiation therapy appt, at [facility].  I phoned them in regards to this and they said that if she could not be there before 1:15 pm that they would cancel today's appt and see her tomorrowl.  PT notified of this.  Tolerated her chemo well.

## 2013-07-17 NOTE — Progress Notes (Signed)
[facility]  OFFICE PROGRESS NOTE  No primary provider on file. No primary provider on file.  DIAGNOSIS: Breast cancer, unspecified laterality - Plan: Magnesium, Magnesium, Ambulatory referral to Physical Therapy, 2D Echocardiogram without contrast  Hypomagnesemia - Plan: magnesium oxide (MAG-OX) 400 (241.3 MG) MG tablet  Hypokalemia - Plan: potassium chloride SA (K-DUR,KLOR-CON) 20 MEQ tablet  Chief Complaint  Patient presents with  . Breast Cancer    on daily RT, q 3 week Pert/Tras    CURRENT THERAPY: Anastrozole 1 mg daily,Pertuzumab/trastuzumab every 3 weeks, daily radiotherapy.  INTERVAL HISTORY: Erika Nunez 62 y.o. female returns for followup a receiving antibody therapy every 3 weeks adjuvantly for HER-2 positive breast cancer while receiving daily radiotherapy 5 days a week and taking anastrozole 1 mg daily for cycle #7 today of a planned 12. She continues to do well with no episodes of nausea or vomiting. Right upper extremity lymphedema is worsening. She denies a sore throat, PND, orthopnea, palpitations, headache, fever, significant night sweats, lower extremity swelling or redness, diarrhea, constipation, dysuria, hematuria, vaginal discharge or dryness, skin rash, headache, or seizures.    MEDICAL HISTORY: Past Medical History  Diagnosis Date  . Hypertension   . High cholesterol   . Cancer   . Breast cancer   . Allergy     seasonal allergies  . Arthritis   . Anxiety   . Breast mass, left 2014  . Breast mass, right 2014  . 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 Depression   . Lymphedema of arm     right arm    INTERIM HISTORY: has Breast cancer; Cellulitis of right upper extremity; Hypertension; Hypokalemia; Nausea with vomiting; Neutropenia; Anemia; Thrombocytopenia, unspecified; Antineoplastic chemotherapy induced pancytopenia; Lymphedema of upper extremity following lymphadenectomy; and  Delayed postoperative wound closure on her problem list.  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  Estrogen Receptor: 0% NEGATIVE  Progesterone Receptor : 0%, NEGATIVE  Proliferation Marker Ki67 by M IB-1 (Low<20%): 70%  She had excisional biopsy of the left breast and 10/20/2012.  Breast, excision, left  - INVASIVE MAMMARY CARCINOMA WITH A PREDOMINANTLY LOBULAR PHENOTYPE, GRADE III/III, SPANNING 4.0  CM.  - LOBULAR CARCINOMA IN SITU.  - DUCTAL CARCINOMA IN SITU.  - LYMPHOVASCULAR INVASION IS IDENTIFIED.  - INVASIVE CARCINOMA IS BROADLY PRESENT AT NON-ORIENTED, INKED TISSUE EDGE  Estrogen Receptor 88%,  Progesterone Receptor17%,  Ki67 by M IB-1 (Low<20%): 61%  She has been treated with carboplatin/docetaxel x 6 cycles (10/23/2012- 02/12/2013) plus Herceptin which will continue for 52 weeks. She continues on 3 weekly Herceptin and after her last received decision was made to add Pertuzumab to the regimen.  As documented in her last visit 02/15/2013;Herceptin was recently held due to development of edema. 2D echo was performed on 01/24/2013 and EF was noted to be stable at 60-65%. As a result, Herceptin was re-initiated.  I saw her last on 03/26/2013 at that time I had referred her to radiation oncology/surgery for consideration of local therapy which is continuing with eating or treatments to go in combination with antibody therapy plus anastrozole.   ALLERGIES:  is allergic to codeine.  MEDICATIONS: has a current medication list which includes the following prescription(s): anastrozole, hydrocodone-acetaminophen, ibuprofen, lidocaine-prilocaine, lisinopril, lisinopril-hydrochlorothiazide, lorazepam, magnesium oxide, potassium chloride sa, pravastatin,  trastuzumab, ondansetron, and prochlorperazine, and the  following Facility-Administered Medications: heparin lock flush, pertuzumab (PERJETA) 420 mg in sodium chloride 0.9 % 250 mL chemo infusion, sodium chloride, and trastuzumab (HERCEPTIN) 504 mg in sodium chloride 0.9 % 250 mL chemo infusion.  SURGICAL HISTORY:  Past Surgical History  Procedure Laterality Date  . Cholecystectomy    . Tubal ligation    . Tonsillectomy    . Breast biopsy  10/02/2012    Procedure: BREAST BIOPSY;  Surgeon: Fabio Bering, MD;  Location: [facility];  Service: General;  Laterality: Right;  . Portacath placement Left 10/20/2012  . Breast biopsy Left 10/20/2012    Procedure: BREAST BIOPSY;  Surgeon: Fabio Bering, MD;  Location: [facility];  Service: General;  Laterality: Left;  Procedure end 1142  . Portacath placement Left 10/20/2012    Procedure: INSERTION PORT-A-CATH;  Surgeon: Fabio Bering, MD;  Location: [facility];  Service: General;  Laterality: Left;  Procedure began 1143; left subclavian  . Simple mastectomy with axillary sentinel node biopsy Bilateral 04/25/2013    Procedure: SIMPLE MASTECTOMY;  Surgeon: Fabio Bering, MD;  Location: [facility];  Service: General;  Laterality: Bilateral;    FAMILY HISTORY: family history includes COPD in her daughter; Cancer in her maternal aunt, maternal aunt, and maternal grandmother; Heart attack in her father; Stroke in her maternal uncle.  SOCIAL HISTORY:  reports that she has never smoked. She has never used smokeless tobacco. She reports that she drinks about 0.6 ounces of alcohol per week. She reports that she does not use illicit drugs.  REVIEW OF SYSTEMS:  Other than that discussed above is noncontributory.  PHYSICAL EXAMINATION: ECOG PERFORMANCE STATUS: 1 - Symptomatic but completely ambulatory  Blood pressure 126/79, pulse 88, temperature 98.3 F (36.8 C), temperature source Oral, resp. rate 18, weight 180 lb 6.4 oz (81.829 kg).  GENERAL:alert, no distress and comfortable SKIN: skin color, texture, turgor are normal, no  rashes or significant lesions EYES: PERLA; Conjunctiva are pink and non-injected, sclera clear OROPHARYNX:no exudate, no erythema on lips, buccal mucosa, or tongue. NECK: supple, thyroid normal size, non-tender, without nodularity. No masses CHEST: Status post right mastectomy with no subcutaneous nodules and closure of her previous surgical wound with radiation therapy tattoos in place. Left mastectomy with no subcutaneous nodules. LYMPH:  no palpable lymphadenopathy in the cervical, axillary or inguinal LUNGS: clear to auscultation and percussion with normal breathing effort HEART: regular rate & rhythm and no murmurs. ABDOMEN:abdomen soft, non-tender and normal bowel sounds MUSCULOSKELETAL:no cyanosis of digits and no clubbing. Range of motion normal. Right upper extremity lymphedema +2 to +3. NEURO: alert & oriented x 3 with fluent speech, no focal motor/sensory deficits   LABORATORY DATA: Office Visit on 07/17/2013  Component Date Value Range Status  . Magnesium 07/17/2013 1.2* 1.5 - 2.5 mg/dL Final  Infusion on 16/06/9603  Component Date Value Range Status  . Sodium 07/17/2013 138  135 - 145 mEq/L Final  . Potassium 07/17/2013 3.5  3.5 - 5.1 mEq/L Final  . Chloride 07/17/2013 100  96 - 112 mEq/L Final  . CO2 07/17/2013 27  19 - 32 mEq/L Final  . Glucose, Bld 07/17/2013 131* 70 - 99 mg/dL Final  . BUN 54/05/8118 19  6 - 23 mg/dL Final  . Creatinine, Ser 07/17/2013 1.17* 0.50 - 1.10 mg/dL Final  . Calcium 14/78/2956 9.8  8.4 - 10.5 mg/dL Final  . Total Protein 07/17/2013 6.7  6.0 - 8.3 g/dL Final  . Albumin 21/30/8657  4.0  3.5 - 5.2 g/dL Final  . AST 16/06/9603 11  0 - 37 U/L Final  . ALT 07/17/2013 8  0 - 35 U/L Final  . Alkaline Phosphatase 07/17/2013 62  39 - 117 U/L Final  . Total Bilirubin 07/17/2013 0.4  0.3 - 1.2 mg/dL Final  . GFR calc non Af Amer 07/17/2013 49* >90 mL/min Final  . GFR calc Af Amer 07/17/2013 56* >90 mL/min Final   Comment: (NOTE)                           The eGFR has been calculated using the CKD EPI equation.                          This calculation has not been validated in all clinical situations.                          eGFR's persistently <90 mL/min signify possible Chronic Kidney                          Disease.  . WBC 07/17/2013 6.2  4.0 - 10.5 K/uL Final  . RBC 07/17/2013 3.50* 3.87 - 5.11 MIL/uL Final  . Hemoglobin 07/17/2013 11.8* 12.0 - 15.0 g/dL Final  . HCT 54/05/8118 33.9* 36.0 - 46.0 % Final  . MCV 07/17/2013 96.9  78.0 - 100.0 fL Final  . MCH 07/17/2013 33.7  26.0 - 34.0 pg Final  . MCHC 07/17/2013 34.8  30.0 - 36.0 g/dL Final  . RDW 14/78/2956 14.2  11.5 - 15.5 % Final  . Platelets 07/17/2013 230  150 - 400 K/uL Final  . Neutrophils Relative % 07/17/2013 66  43 - 77 % Final  . Neutro Abs 07/17/2013 4.1  1.7 - 7.7 K/uL Final  . Lymphocytes Relative 07/17/2013 20  12 - 46 % Final  . Lymphs Abs 07/17/2013 1.2  0.7 - 4.0 K/uL Final  . Monocytes Relative 07/17/2013 11  3 - 12 % Final  . Monocytes Absolute 07/17/2013 0.7  0.1 - 1.0 K/uL Final  . Eosinophils Relative 07/17/2013 3  0 - 5 % Final  . Eosinophils Absolute 07/17/2013 0.2  0.0 - 0.7 K/uL Final  . Basophils Relative 07/17/2013 0  0 - 1 % Final  . Basophils Absolute 07/17/2013 0.0  0.0 - 0.1 K/uL Final  Infusion on 06/26/2013  Component Date Value Range Status  . WBC 06/26/2013 7.7  4.0 - 10.5 K/uL Final  . RBC 06/26/2013 3.89  3.87 - 5.11 MIL/uL Final  . Hemoglobin 06/26/2013 13.0  12.0 - 15.0 g/dL Final  . HCT 21/30/8657 37.8  36.0 - 46.0 % Final  . MCV 06/26/2013 97.2  78.0 - 100.0 fL Final  . MCH 06/26/2013 33.4  26.0 - 34.0 pg Final  . MCHC 06/26/2013 34.4  30.0 - 36.0 g/dL Final  . RDW 84/69/6295 13.7  11.5 - 15.5 % Final  . Platelets 06/26/2013 234  150 - 400 K/uL Final  . Neutrophils Relative % 06/26/2013 64  43 - 77 % Final  . Neutro Abs 06/26/2013 4.9  1.7 - 7.7 K/uL Final  . Lymphocytes Relative 06/26/2013 24  12 - 46 % Final  . Lymphs Abs  06/26/2013 1.9  0.7 - 4.0 K/uL Final  . Monocytes Relative 06/26/2013 9  3 - 12 % Final  .  Monocytes Absolute 06/26/2013 0.7  0.1 - 1.0 K/uL Final  . Eosinophils Relative 06/26/2013 3  0 - 5 % Final  . Eosinophils Absolute 06/26/2013 0.2  0.0 - 0.7 K/uL Final  . Basophils Relative 06/26/2013 0  0 - 1 % Final  . Basophils Absolute 06/26/2013 0.0  0.0 - 0.1 K/uL Final  . Sodium 06/26/2013 139  135 - 145 mEq/L Final  . Potassium 06/26/2013 4.1  3.5 - 5.1 mEq/L Final  . Chloride 06/26/2013 104  96 - 112 mEq/L Final  . CO2 06/26/2013 22  19 - 32 mEq/L Final  . Glucose, Bld 06/26/2013 90  70 - 99 mg/dL Final  . BUN 21/30/8657 18  6 - 23 mg/dL Final  . Creatinine, Ser 06/26/2013 1.29* 0.50 - 1.10 mg/dL Final  . Calcium 84/69/6295 9.8  8.4 - 10.5 mg/dL Final  . Total Protein 06/26/2013 7.0  6.0 - 8.3 g/dL Final  . Albumin 28/41/3244 4.2  3.5 - 5.2 g/dL Final  . AST 09/15/7251 12  0 - 37 U/L Final  . ALT 06/26/2013 8  0 - 35 U/L Final  . Alkaline Phosphatase 06/26/2013 68  39 - 117 U/L Final  . Total Bilirubin 06/26/2013 0.4  0.3 - 1.2 mg/dL Final  . GFR calc non Af Amer 06/26/2013 43* >90 mL/min Final  . GFR calc Af Amer 06/26/2013 50* >90 mL/min Final   Comment: (NOTE)                          The eGFR has been calculated using the CKD EPI equation.                          This calculation has not been validated in all clinical situations.                          eGFR's persistently <90 mL/min signify possible Chronic Kidney                          Disease.    PATHOLOGY: for GABBI, WHETSTONE ([id number]) Patient: JUNELLA, DOMKE Collected: 04/25/2013 Client: [facility] Accession: [id number] Received: 04/26/2013 Tilford Pillar DOB: 06/11/1951 Age: 89 Gender: F Reported: 04/27/2013 [address] Patient Ph: [phone] MRN #: [id number] [place] [address] Visit #: [id number] Chart #: Phone: [phone] Fax: CC: REPORT OF SURGICAL PATHOLOGY FINAL DIAGNOSIS Diagnosis 1.  Breast, simple mastectomy, left - FIBROSIS WITH INFLAMMATION AND GIANT CELL REACTION CONSISTENT WITH PREVIOUS EXCISION. - NO RESIDUAL CARCINOMA IDENTIFIED. 2. Breast, simple mastectomy, right - FIBROSIS WITH INFLAMMATION AND GIANT CELL REACTION CONSISTENT WITH PREVIOUS EXCISION. - NO RESIDUAL CARCINOMA IDENTIFIED. Microscopic Comment 1. There is fibrosis, patchy inflammation with granulation tissue and giant cell reaction consistent with previous biopsy. No residual carcinoma is identified. This would correspond to a ypTX. 2. There are large areas of dense fibrosis associated with patchy chronic inflammation, granulation tissue and giant cell reaction consistent with previous excision. No residual carcinoma is identified. This would correspond to a ypTX. (JDP:ecj 04/27/2013) Jimmy Picket MD Pathologist, Electronic Signature (Case signed 04/27/2013) Specimen Gross and Clinical Information Specimen(s) Obtained: 1. Breast, simple mastectomy, left 2. Breast, simple mastectomy, right Specimen Clinical Information 2. metastatic breast carcinoma Gross 1. Specimen: Unoriented Left simple mastectomy. Specimen integrity (intact/disrupted): Intact. Weight: 661 grams. Size: 27.0 x 14.0 x 4.0 cm. 1  of 2 FINAL for REAGYN, FACEMIRE ([id number]) Gross(continued) Skin: 25.0 x 6.5 cm, displays a central unremarkable1.2 x 1.0 cm nipple. Adjacent to the nipple on the unoriented specimen a 5.0 cm linear scar is identified along the areola coming to 2 cm from the closest non designated skin margin. A portion of the scar displays a 1.5 cm defect and is open communicating with an underlying previous biopsy cavity. The skin surface is smmoth and grossly unremarkable with no ulcerated areas. Tumor/cavity: The cavity measures 5.0 x 1.5 x 1.5 cm communicates with the defect within the scar on the skin surface and comes to 1 cm from the grossly uninvolved deep posterior margin. The cavity displays a dark red  fibrotic lining. The borders are tan white and fibrotic no obvious tumor is grossly appreciated. The biopsy cavity comes to 1.5 cm from the anterior soft tissue margin. Uninvolved parenchyma: Tan yellow to pink, lobulated with unremarkable fibrofatty tissue. No additional masses of lesions are identified. Prognostic indicators: Can be done off the paraffin block. Lymph nodes: Along the periphery, no lymph nodes are grossly appreciated. Block summary: A-B= central previous biopsy cavity taken to posterior deep margin. C= central biopsy cavity taken to closest anterior soft tissue margin. D-G= representative sections of central biopsy cavity taken to scar bearing skin with defect in F-G. H-K= representative sections of uninvolved parenchyma from non designated quadrant. L= nipple. 2. Specimen: Unoriented right simple mastectomy. Specimen integrity (intact/disrupted): Intact. Weight: 402 grams. Size: 21.0 x 15.0 x 2.5 cm. Skin: 21.0 x 7.5 cm. There is an eccentrically located nipple and a central 7.0 cm well healed linear scar. Centrally located surrounding the nipple and extending along the superior to inferior aspect the scar is well healed and indurated. Tumor/cavity: Upon sectioning there is an indurated to rubbery tan white possible mass centrally located subjacent to the overlying scar and abutting the indurated and slightly retracted deep posterior margin. This indurated central mass-like area measures 6.0 cm from lateral to medial, 5 cm from superior to inferior and 2.0 cm from anterior to posterior. This mass like area comes to 2 cm from the closest anterior soft tissue margin. Uninvolved parenchyma: Tan yellow to pink with unremarkable fibrofatty tissue. No additional masses or lesions are identified throughout the specimen. Prognostic indicators: Can be done off the paraffin block. Lymph nodes: Along the periphery no lymph nodes are grossly appreciated. Block summary: A-H=  representative sections of central indurated to rubbery mass-like area taken to deep posterior margin and overlying scar bearing skin. I-L= representative sections of uninvolved parenchyma from the four non designated quadrants. (JNK:gt, 04/26/13) Report signed out from the following location(s) Technical Component and Interpretation performed at [facility] [address], [place],  [address]. CLIA #: [id number], Urinalysis No results found for this basename: colorurine,  appearanceur,  labspec,  phurine,  glucoseu,  hgbur,  bilirubinur,  ketonesur,  proteinur,  urobilinogen,  nitrite,  leukocytesur    RADIOGRAPHIC STUDIES: No results found.  ASSESSMENT:  #1.Locally advanced right breast cancer, status post neoadjuvant treatment with TCH x6 followed by right modified radical mastectomy and left simple mastectomy, currently receiving Pertuzumab and trastuzumab intravenously every 3 weeks with plans to initiate radiotherapy on 07/03/2013 if chest wall healed, tolerating radiation therapy plus anastrozole with plans to deliver protrusion mat and trastuzumab intravenously today. #2. Right upper extremity lymphedema, worsening.    PLAN:  #1. Intravenous Pertuxumaband trastuzumab today. #2. Continue anastrozole 1 mg daily. #3. Continue daily radiotherapy. #4. Referral to  physical therapy for lymphedema management. #5. Echocardiogram in 2 weeks.   All questions were answered. The patient knows to call the clinic with any problems, questions or concerns. We can certainly see the patient much sooner if necessary.   I spent 25 minutes counseling the patient face to face. The total time spent in the appointment was 30 minutes.    Maurilio Lovely, MD 07/17/2013 11:09 AM

## 2013-07-19 ENCOUNTER — Other Ambulatory Visit: Payer: Self-pay

## 2013-07-30 ENCOUNTER — Ambulatory Visit (HOSPITAL_COMMUNITY)
Admission: RE | Admit: 2013-07-30 | Discharge: 2013-07-30 | Disposition: A | Discharge status: Home / Self Care | Payer: Medicaid Other | Source: Ambulatory Visit | Attending: Hematology and Oncology | Admitting: Hematology and Oncology

## 2013-07-30 DIAGNOSIS — IMO0001 Reserved for inherently not codable concepts without codable children: Secondary | ICD-10-CM | POA: Insufficient documentation

## 2013-07-30 DIAGNOSIS — I972 Postmastectomy lymphedema syndrome: Secondary | ICD-10-CM | POA: Insufficient documentation

## 2013-07-30 DIAGNOSIS — I10 Essential (primary) hypertension: Secondary | ICD-10-CM | POA: Insufficient documentation

## 2013-07-30 NOTE — Evaluation (Signed)
Physical Therapy Evaluation  Patient Details  Name: Erika Nunez MRN: [id number] Date of Birth: [birth year]  Today's Date: 07/30/2013 Time: 4098-1191 PT Time Calculation (min): 45 min eval             Visit#: 1 of 4  Re-eval: 07/30/13 Assessment Diagnosis: lymphedema Surgical Date: 04/25/13  Authorization: medicaid    Authorization Visit#: 1 of 4   Past Medical History:  Past Medical History  Diagnosis Date  . Hypertension   . High cholesterol   . Cancer   . Breast cancer   . Allergy     seasonal allergies  . Arthritis   . Anxiety   . Breast mass, left 2014  . Breast mass, right 2014  . 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 Depression   . Lymphedema of arm     right arm   Past Surgical History:  Past Surgical History  Procedure Laterality Date  . Cholecystectomy    . Tubal ligation    . Tonsillectomy    . Breast biopsy  10/02/2012    Procedure: BREAST BIOPSY;  Surgeon: Fabio Bering, MD;  Location: [facility];  Service: General;  Laterality: Right;  . Portacath placement Left 10/20/2012  . Breast biopsy Left 10/20/2012    Procedure: BREAST BIOPSY;  Surgeon: Fabio Bering, MD;  Location: [facility];  Service: General;  Laterality: Left;  Procedure end 1142  . Portacath placement Left 10/20/2012    Procedure: INSERTION PORT-A-CATH;  Surgeon: Fabio Bering, MD;  Location: [facility];  Service: General;  Laterality: Left;  Procedure began 1143; left subclavian  . Simple mastectomy with axillary sentinel node biopsy Bilateral 04/25/2013    Procedure: SIMPLE MASTECTOMY;  Surgeon: Fabio Bering, MD;  Location: [facility];  Service: General;  Laterality: Bilateral;    Subjective Symptoms/Limitations Symptoms: Ms. Salts was diagnosed with breast January 19th.  She had a masectomy in August and is currently completing both radiation an chemotherapy.  She noted increased swelling in her right arm about three months ago and about a week later she was in the  hospital with cellulitis.    Assessment   volume of Rt UE 12,902.21; Volume of Lt UE 8113.75   Exercise/Treatments Pt educated in the lymph system.  The higher risk of cellulitis that individuals who have lymphedema have and the need to wear a compression garment daily for the rest of her life.   Physical Therapy Assessment and Plan PT Assessment and Plan Clinical Impression Statement: Pt with Rt lymphedema of UE following masectomy and radiation.  Pt will benefit from skilled therapy for manual techniques to decrease fluid volume Pt will benefit from skilled therapeutic intervention in order to improve on the following deficits: Increased edema Rehab Potential: Good PT Frequency:  (3x in one week) PT Plan: see pt for three treatments to decrease volume of flluid in Rt UE    Goals Home Exercise Program Pt/caregiver will Perform Home Exercise Program: With Supervision, verbal cues required/provided (manual lymph drainage) PT Short Term Goals Time to Complete Short Term Goals: 2 weeks PT Short Term Goal 1: able to state the signs and syptom of cellulitis PT Short Term Goal 2: Able to verbalize the importance of getting a compression garment and the need to wear daily PT Short Term Goal 3: Pt /daughter to be able to verbalize the premise of complete decongestive manual techniques.  Problem List Patient Active Problem List   Diagnosis Date Noted  . Lymphedema of upper  extremity following lymphadenectomy 06/05/2013  . Delayed postoperative wound closure 06/05/2013  . Neutropenia 01/04/2013  . Anemia 01/04/2013  . Thrombocytopenia, unspecified 01/04/2013  . 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 Nausea with vomiting 01/03/2013  . Hypokalemia 01/02/2013  . Cellulitis of right upper extremity 12/31/2012  . Hypertension 12/31/2012  . Breast cancer 10/12/2012    PT Plan of Care PT Home Exercise Plan: given  GP    RUSSELL,CINDY 07/30/2013, 4:18  PM  Physician Documentation Your signature is required to indicate approval of the treatment plan as stated above.  Please sign and either send electronically or make a copy of this report for your files and return this physician signed original.   Please mark one 1.__approve of plan  2. ___approve of plan with the following conditions.   ______________________________                                                          _____________________ Physician Signature                                                                                                             Date

## 2013-07-31 ENCOUNTER — Encounter (HOSPITAL_COMMUNITY): Payer: Self-pay | Admitting: Oncology

## 2013-08-07 ENCOUNTER — Inpatient Hospital Stay (HOSPITAL_COMMUNITY): Payer: Medicaid Other

## 2013-08-07 ENCOUNTER — Encounter (HOSPITAL_BASED_OUTPATIENT_CLINIC_OR_DEPARTMENT_OTHER): Payer: Medicaid Other

## 2013-08-07 ENCOUNTER — Other Ambulatory Visit (HOSPITAL_COMMUNITY): Payer: Self-pay | Admitting: Oncology

## 2013-08-07 ENCOUNTER — Ambulatory Visit (HOSPITAL_COMMUNITY)
Admission: RE | Admit: 2013-08-07 | Discharge: 2013-08-07 | Disposition: A | Discharge status: Home / Self Care | Payer: Medicaid Other | Source: Ambulatory Visit | Attending: Hematology and Oncology | Admitting: Hematology and Oncology

## 2013-08-07 VITALS — BP 110/59 | HR 103 | Temp 98.0°F | Resp 16

## 2013-08-07 DIAGNOSIS — D696 Thrombocytopenia, unspecified: Secondary | ICD-10-CM

## 2013-08-07 DIAGNOSIS — I10 Essential (primary) hypertension: Secondary | ICD-10-CM | POA: Insufficient documentation

## 2013-08-07 DIAGNOSIS — E785 Hyperlipidemia, unspecified: Secondary | ICD-10-CM | POA: Insufficient documentation

## 2013-08-07 DIAGNOSIS — L03113 Cellulitis of right upper limb: Secondary | ICD-10-CM

## 2013-08-07 DIAGNOSIS — Z09 Encounter for follow-up examination after completed treatment for conditions other than malignant neoplasm: Secondary | ICD-10-CM

## 2013-08-07 DIAGNOSIS — Z5112 Encounter for antineoplastic immunotherapy: Secondary | ICD-10-CM

## 2013-08-07 DIAGNOSIS — E876 Hypokalemia: Secondary | ICD-10-CM

## 2013-08-07 DIAGNOSIS — C50919 Malignant neoplasm of unspecified site of unspecified female breast: Secondary | ICD-10-CM

## 2013-08-07 DIAGNOSIS — R112 Nausea with vomiting, unspecified: Secondary | ICD-10-CM

## 2013-08-07 DIAGNOSIS — C773 Secondary and unspecified malignant neoplasm of axilla and upper limb lymph nodes: Secondary | ICD-10-CM

## 2013-08-07 DIAGNOSIS — D709 Neutropenia, unspecified: Secondary | ICD-10-CM

## 2013-08-07 DIAGNOSIS — E8989 Other postprocedural endocrine and metabolic complications and disorders: Secondary | ICD-10-CM

## 2013-08-07 DIAGNOSIS — D61810 Antineoplastic chemotherapy induced pancytopenia: Secondary | ICD-10-CM

## 2013-08-07 DIAGNOSIS — Z481 Encounter for planned postprocedural wound closure: Secondary | ICD-10-CM

## 2013-08-07 DIAGNOSIS — D649 Anemia, unspecified: Secondary | ICD-10-CM

## 2013-08-07 LAB — COMPREHENSIVE METABOLIC PANEL
AST: 11 U/L (ref 0–37)
Alkaline Phosphatase: 58 U/L (ref 39–117)
BUN: 21 mg/dL (ref 6–23)
CO2: 26 mEq/L (ref 19–32)
Chloride: 102 mEq/L (ref 96–112)
Creatinine, Ser: 1.22 mg/dL — ABNORMAL HIGH (ref 0.50–1.10)
GFR calc Af Amer: 53 mL/min — ABNORMAL LOW (ref >90)
GFR calc non Af Amer: 46 mL/min — ABNORMAL LOW (ref >90)
Total Bilirubin: 0.3 mg/dL (ref 0.3–1.2)

## 2013-08-07 LAB — CBC WITH DIFFERENTIAL/PLATELET
HCT: 33.9 % — ABNORMAL LOW (ref 36.0–46.0)
Hemoglobin: 11.8 g/dL — ABNORMAL LOW (ref 12.0–15.0)
Lymphocytes Relative: 13 % (ref 12–46)
Monocytes Absolute: 0.6 10*3/uL (ref 0.1–1.0)
Monocytes Relative: 10 % (ref 3–12)
Neutro Abs: 4.6 10*3/uL (ref 1.7–7.7)
Platelets: 193 10*3/uL (ref 150–400)
RBC: 3.44 MIL/uL — ABNORMAL LOW (ref 3.87–5.11)
WBC: 6 10*3/uL (ref 4.0–10.5)

## 2013-08-07 MED ORDER — HEPARIN SOD (PORK) LOCK FLUSH 100 UNIT/ML IV SOLN
INTRAVENOUS | Status: AC
  Filled 2013-08-07: qty 5

## 2013-08-07 MED ORDER — TRASTUZUMAB CHEMO INJECTION 440 MG
6.0000 mg/kg | Freq: Once | INTRAVENOUS | Status: AC
  Administered 2013-08-07: 504 mg via INTRAVENOUS
  Filled 2013-08-07: qty 24

## 2013-08-07 MED ORDER — HYDROCODONE-ACETAMINOPHEN 5-325 MG PO TABS: 1.0000 | ORAL_TABLET | ORAL | Status: DC | PRN

## 2013-08-07 MED ORDER — SODIUM CHLORIDE 0.9 % IV SOLN
420.0000 mg | Freq: Once | INTRAVENOUS | Status: AC
  Administered 2013-08-07: 420 mg via INTRAVENOUS
  Filled 2013-08-07: qty 14

## 2013-08-07 MED ORDER — HEPARIN SOD (PORK) LOCK FLUSH 100 UNIT/ML IV SOLN
500.0000 [IU] | Freq: Once | INTRAVENOUS | Status: AC | PRN
  Administered 2013-08-07: 500 [IU]

## 2013-08-07 MED ORDER — SODIUM CHLORIDE 0.9 % IJ SOLN
10.0000 mL | INTRAMUSCULAR | Status: DC | PRN
  Administered 2013-08-07: 10 mL

## 2013-08-07 MED ORDER — SODIUM CHLORIDE 0.9 % IV SOLN
Freq: Once | INTRAVENOUS | Status: AC
  Administered 2013-08-07: 11:00:00 via INTRAVENOUS

## 2013-08-07 NOTE — Progress Notes (Signed)
Tolerated herceptin and perjeta well.  Left clinic to keep app tfor ecg.  Will return to clinic afterwards to pick up prescription for pain med.

## 2013-08-07 NOTE — Progress Notes (Signed)
*  PRELIMINARY RESULTS* Echocardiogram 2D Echocardiogram has been performed.  Jesselyn Rask 08/07/2013, 3:41 PM

## 2013-08-13 ENCOUNTER — Other Ambulatory Visit (HOSPITAL_COMMUNITY): Payer: Self-pay | Admitting: Oncology

## 2013-08-13 DIAGNOSIS — C50911 Malignant neoplasm of unspecified site of right female breast: Secondary | ICD-10-CM

## 2013-08-13 MED ORDER — LORAZEPAM 1 MG PO TABS: 1.0000 mg | ORAL_TABLET | ORAL | Status: DC | PRN

## 2013-08-14 ENCOUNTER — Ambulatory Visit (HOSPITAL_COMMUNITY): Payer: Medicaid Other | Admitting: Physical Therapy

## 2013-08-15 ENCOUNTER — Ambulatory Visit (HOSPITAL_COMMUNITY): Payer: Medicaid Other | Admitting: Physical Therapy

## 2013-08-16 ENCOUNTER — Ambulatory Visit (HOSPITAL_COMMUNITY): Payer: Medicaid Other | Admitting: Physical Therapy

## 2013-08-28 ENCOUNTER — Encounter (HOSPITAL_COMMUNITY): Payer: Medicaid Other | Attending: Oncology

## 2013-08-28 ENCOUNTER — Encounter (HOSPITAL_BASED_OUTPATIENT_CLINIC_OR_DEPARTMENT_OTHER): Payer: Medicaid Other

## 2013-08-28 ENCOUNTER — Encounter (HOSPITAL_COMMUNITY): Payer: Self-pay

## 2013-08-28 VITALS — BP 125/65 | HR 77 | Temp 97.8°F | Resp 18 | Wt 182.6 lb

## 2013-08-28 DIAGNOSIS — C50911 Malignant neoplasm of unspecified site of right female breast: Secondary | ICD-10-CM

## 2013-08-28 DIAGNOSIS — C773 Secondary and unspecified malignant neoplasm of axilla and upper limb lymph nodes: Secondary | ICD-10-CM

## 2013-08-28 DIAGNOSIS — I890 Lymphedema, not elsewhere classified: Secondary | ICD-10-CM

## 2013-08-28 DIAGNOSIS — C50919 Malignant neoplasm of unspecified site of unspecified female breast: Secondary | ICD-10-CM | POA: Insufficient documentation

## 2013-08-28 DIAGNOSIS — T451X5A Adverse effect of antineoplastic and immunosuppressive drugs, initial encounter: Secondary | ICD-10-CM | POA: Insufficient documentation

## 2013-08-28 DIAGNOSIS — Z5112 Encounter for antineoplastic immunotherapy: Secondary | ICD-10-CM

## 2013-08-28 LAB — COMPREHENSIVE METABOLIC PANEL
ALT: 8 U/L (ref 0–35)
Albumin: 4.2 g/dL (ref 3.5–5.2)
Alkaline Phosphatase: 55 U/L (ref 39–117)
BUN: 23 mg/dL (ref 6–23)
Chloride: 102 mEq/L (ref 96–112)
GFR calc Af Amer: 45 mL/min — ABNORMAL LOW (ref >90)
GFR calc non Af Amer: 39 mL/min — ABNORMAL LOW (ref >90)
Glucose, Bld: 80 mg/dL (ref 70–99)
Potassium: 3.9 mEq/L (ref 3.5–5.1)
Sodium: 139 mEq/L (ref 135–145)
Total Bilirubin: 0.4 mg/dL (ref 0.3–1.2)

## 2013-08-28 LAB — CBC WITH DIFFERENTIAL/PLATELET
Basophils Relative: 0 % (ref 0–1)
Hemoglobin: 12.4 g/dL (ref 12.0–15.0)
Lymphs Abs: 0.8 10*3/uL (ref 0.7–4.0)
MCHC: 35.1 g/dL (ref 30.0–36.0)
Monocytes Relative: 12 % (ref 3–12)
Neutro Abs: 4.4 10*3/uL (ref 1.7–7.7)
Neutrophils Relative %: 72 % (ref 43–77)
Platelets: 224 10*3/uL (ref 150–400)
RBC: 3.56 MIL/uL — ABNORMAL LOW (ref 3.87–5.11)
RDW: 13.2 % (ref 11.5–15.5)
WBC: 6.1 10*3/uL (ref 4.0–10.5)

## 2013-08-28 MED ORDER — SODIUM CHLORIDE 0.9 % IJ SOLN
10.0000 mL | INTRAMUSCULAR | Status: DC | PRN
  Administered 2013-08-28: 10 mL

## 2013-08-28 MED ORDER — TRASTUZUMAB CHEMO INJECTION 440 MG
6.0000 mg/kg | Freq: Once | INTRAVENOUS | Status: AC
  Administered 2013-08-28: 504 mg via INTRAVENOUS
  Filled 2013-08-28: qty 24

## 2013-08-28 MED ORDER — SODIUM CHLORIDE 0.9 % IV SOLN
420.0000 mg | Freq: Once | INTRAVENOUS | Status: AC
  Administered 2013-08-28: 420 mg via INTRAVENOUS
  Filled 2013-08-28: qty 14

## 2013-08-28 MED ORDER — HEPARIN SOD (PORK) LOCK FLUSH 100 UNIT/ML IV SOLN
500.0000 [IU] | Freq: Once | INTRAVENOUS | Status: AC | PRN
  Administered 2013-08-28: 500 [IU]

## 2013-08-28 MED ORDER — SODIUM CHLORIDE 0.9 % IV SOLN
Freq: Once | INTRAVENOUS | Status: AC
  Administered 2013-08-28: 12:00:00 via INTRAVENOUS

## 2013-08-28 MED ORDER — HEPARIN SOD (PORK) LOCK FLUSH 100 UNIT/ML IV SOLN
INTRAVENOUS | Status: AC
  Filled 2013-08-28: qty 5

## 2013-08-28 NOTE — Progress Notes (Signed)
[facility]  OFFICE PROGRESS NOTE  Tommie Raymond, MD [name] [place] [address]  DIAGNOSIS: Breast cancer, right  Chief Complaint  Patient presents with  . Breast Cancer    CURRENT THERAPY: Anastrozole 1 mg daily with pertuzumab/trastuzumab every 3 weeks for cycle #9 today, status post 6 cycles of carboplatin, docetaxel, pertuzumab, trastuzumab.  INTERVAL HISTORY: MAKAILAH SLAVICK 62 y.o. female returns for continuation of adjuvant antibody therapy for HER-2 positive breast cancer.  She will complete radiotherapy tomorrow while taking oral anastrozole 1 mg daily. She will receive intravenous antibody therapy today represents cycle #9 of a planned 12. Appetite been good with no nausea, vomiting, diarrhea, constipation, PND, orthopnea, palpitations, lower extremity swelling or redness, worsening lymphedema, joint pain or swelling, skin rash, headache, or seizures.  MEDICAL HISTORY: Past Medical History  Diagnosis Date  . Hypertension   . High cholesterol   . Cancer   . Breast cancer   . Allergy     seasonal allergies  . Arthritis   . Anxiety   . Breast mass, left 2014  . Breast mass, right 2014  . 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 Depression   . Lymphedema of arm     right arm    INTERIM HISTORY: has Breast cancer; Cellulitis of right upper extremity; Hypertension; Hypokalemia; Nausea with vomiting; Neutropenia; Anemia; Thrombocytopenia, unspecified; Antineoplastic chemotherapy induced pancytopenia; Lymphedema of upper extremity following lymphadenectomy; and Delayed postoperative wound closure on her problem list.   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  Estrogen Receptor: 0% NEGATIVE  Progesterone Receptor : 0%, NEGATIVE  Proliferation Marker Ki67 by M IB-1 (Low<20%): 70%  She  had excisional biopsy of the left breast and 10/20/2012.  Breast, excision, left  - INVASIVE MAMMARY CARCINOMA WITH A PREDOMINANTLY LOBULAR PHENOTYPE, GRADE III/III, SPANNING 4.0  CM.  - LOBULAR CARCINOMA IN SITU.  - DUCTAL CARCINOMA IN SITU.  - LYMPHOVASCULAR INVASION IS IDENTIFIED.  - INVASIVE CARCINOMA IS BROADLY PRESENT AT NON-ORIENTED, INKED TISSUE EDGE  Estrogen Receptor 88%,  Progesterone Receptor17%,  Ki67 by M IB-1 (Low<20%): 61%  She has been treated with carboplatin/docetaxel x 6 cycles (10/23/2012- 02/12/2013) plus Herceptin which will continue for 52 weeks. She continues on 3 weekly Herceptin and after her last received decision was made to add Pertuzumab to the regimen.  As documented in her last visit 02/15/2013;Herceptin was recently held due to development of edema. 2D echo was performed on 01/24/2013 and EF was noted to be stable at 60-65%. As a result, Herceptin was re-initiated.  I saw her last on 03/26/2013 at that time I had referred her to radiation oncology/surgery for consideration of local therapy which is continuing with eating or treatments to go in combination with antibody therapy plus anastrozole. She will complete radiotherapy tomorrow.    ALLERGIES:  is allergic to codeine.  MEDICATIONS: has a current medication list which includes the following prescription(s): anastrozole, hydrocodone-acetaminophen, ibuprofen, lidocaine-prilocaine, lisinopril, lisinopril-hydrochlorothiazide, lorazepam, magnesium oxide, ondansetron, potassium chloride sa, pravastatin, prochlorperazine, and trastuzumab, and the following Facility-Administered Medications: heparin lock flush, pertuzumab (PERJETA) 420 mg in sodium chloride 0.9 % 250 mL chemo infusion, sodium chloride, and trastuzumab (HERCEPTIN) 504 mg in sodium chloride 0.9 % 250 mL chemo infusion.  SURGICAL HISTORY:  Past Surgical History  Procedure Laterality Date  . Cholecystectomy    . Tubal ligation    .  Tonsillectomy    .  Breast biopsy  10/02/2012    Procedure: BREAST BIOPSY;  Surgeon: Fabio Bering, MD;  Location: [facility];  Service: General;  Laterality: Right;  . Portacath placement Left 10/20/2012  . Breast biopsy Left 10/20/2012    Procedure: BREAST BIOPSY;  Surgeon: Fabio Bering, MD;  Location: [facility];  Service: General;  Laterality: Left;  Procedure end 1142  . Portacath placement Left 10/20/2012    Procedure: INSERTION PORT-A-CATH;  Surgeon: Fabio Bering, MD;  Location: [facility];  Service: General;  Laterality: Left;  Procedure began 1143; left subclavian  . Simple mastectomy with axillary sentinel node biopsy Bilateral 04/25/2013    Procedure: SIMPLE MASTECTOMY;  Surgeon: Fabio Bering, MD;  Location: [facility];  Service: General;  Laterality: Bilateral;    FAMILY HISTORY: family history includes COPD in her daughter; Cancer in her maternal aunt, maternal aunt, and maternal grandmother; Heart attack in her father; Stroke in her maternal uncle.  SOCIAL HISTORY:  reports that she has never smoked. She has never used smokeless tobacco. She reports that she drinks about 0.6 ounces of alcohol per week. She reports that she does not use illicit drugs.  REVIEW OF SYSTEMS:  Other than that discussed above is noncontributory.  PHYSICAL EXAMINATION: ECOG PERFORMANCE STATUS: 0 - Asymptomatic  Blood pressure 125/65, pulse 77, temperature 97.8 F (36.6 C), temperature source Oral, resp. rate 18, weight 182 lb 9.6 oz (82.827 kg).  GENERAL:alert, no distress and comfortable. Resolving alopecia SKIN: skin color, texture, turgor are normal, no rashes or significant lesions EYES: PERLA; Conjunctiva are pink and non-injected, sclera clear OROPHARYNX:no exudate, no erythema on lips, buccal mucosa, or tongue. NECK: supple, thyroid normal size, non-tender, without nodularity. No masses CHEST: Status post right mastectomy with no subcutaneous nodules. Minimal erythema due to radiation. Left mastectomy with no subcutaneous  nodules. LYMPH:  no palpable lymphadenopathy in the cervical, axillary or inguinal LUNGS: clear to auscultation and percussion with normal breathing effort HEART: regular rate & rhythm and no murmurs. ABDOMEN:abdomen soft, non-tender and normal bowel sounds MUSCULOSKELETAL:no cyanosis of digits and no clubbing. Range of motion normal.  NEURO: alert & oriented x 3 with fluent speech, no focal motor/sensory deficits   LABORATORY DATA: Infusion on 08/28/2013  Component Date Value Range Status  . WBC 08/28/2013 6.1  4.0 - 10.5 K/uL Final  . RBC 08/28/2013 3.56* 3.87 - 5.11 MIL/uL Final  . Hemoglobin 08/28/2013 12.4  12.0 - 15.0 g/dL Final  . HCT 16/06/9603 35.3* 36.0 - 46.0 % Final  . MCV 08/28/2013 99.2  78.0 - 100.0 fL Final  . MCH 08/28/2013 34.8* 26.0 - 34.0 pg Final  . MCHC 08/28/2013 35.1  30.0 - 36.0 g/dL Final  . RDW 54/05/8118 13.2  11.5 - 15.5 % Final  . Platelets 08/28/2013 224  150 - 400 K/uL Final  . Neutrophils Relative % 08/28/2013 72  43 - 77 % Final  . Neutro Abs 08/28/2013 4.4  1.7 - 7.7 K/uL Final  . Lymphocytes Relative 08/28/2013 14  12 - 46 % Final  . Lymphs Abs 08/28/2013 0.8  0.7 - 4.0 K/uL Final  . Monocytes Relative 08/28/2013 12  3 - 12 % Final  . Monocytes Absolute 08/28/2013 0.7  0.1 - 1.0 K/uL Final  . Eosinophils Relative 08/28/2013 2  0 - 5 % Final  . Eosinophils Absolute 08/28/2013 0.1  0.0 - 0.7 K/uL Final  . Basophils Relative 08/28/2013 0  0 - 1 % Final  .  Basophils Absolute 08/28/2013 0.0  0.0 - 0.1 K/uL Final  Infusion on 08/07/2013  Component Date Value Range Status  . WBC 08/07/2013 6.0  4.0 - 10.5 K/uL Final  . RBC 08/07/2013 3.44* 3.87 - 5.11 MIL/uL Final  . Hemoglobin 08/07/2013 11.8* 12.0 - 15.0 g/dL Final  . HCT 96/12/5407 33.9* 36.0 - 46.0 % Final  . MCV 08/07/2013 98.5  78.0 - 100.0 fL Final  . MCH 08/07/2013 34.3* 26.0 - 34.0 pg Final  . MCHC 08/07/2013 34.8  30.0 - 36.0 g/dL Final  . RDW 81/19/1478 13.7  11.5 - 15.5 % Final  .  Platelets 08/07/2013 193  150 - 400 K/uL Final  . Neutrophils Relative % 08/07/2013 76  43 - 77 % Final  . Neutro Abs 08/07/2013 4.6  1.7 - 7.7 K/uL Final  . Lymphocytes Relative 08/07/2013 13  12 - 46 % Final  . Lymphs Abs 08/07/2013 0.8  0.7 - 4.0 K/uL Final  . Monocytes Relative 08/07/2013 10  3 - 12 % Final  . Monocytes Absolute 08/07/2013 0.6  0.1 - 1.0 K/uL Final  . Eosinophils Relative 08/07/2013 2  0 - 5 % Final  . Eosinophils Absolute 08/07/2013 0.1  0.0 - 0.7 K/uL Final  . Basophils Relative 08/07/2013 0  0 - 1 % Final  . Basophils Absolute 08/07/2013 0.0  0.0 - 0.1 K/uL Final  . Sodium 08/07/2013 140  135 - 145 mEq/L Final  . Potassium 08/07/2013 3.9  3.5 - 5.1 mEq/L Final  . Chloride 08/07/2013 102  96 - 112 mEq/L Final  . CO2 08/07/2013 26  19 - 32 mEq/L Final  . Glucose, Bld 08/07/2013 127* 70 - 99 mg/dL Final  . BUN 29/56/2130 21  6 - 23 mg/dL Final  . Creatinine, Ser 08/07/2013 1.22* 0.50 - 1.10 mg/dL Final  . Calcium 86/57/8469 9.3  8.4 - 10.5 mg/dL Final  . Total Protein 08/07/2013 6.4  6.0 - 8.3 g/dL Final  . Albumin 62/95/2841 3.8  3.5 - 5.2 g/dL Final  . AST 32/44/0102 11  0 - 37 U/L Final  . ALT 08/07/2013 8  0 - 35 U/L Final  . Alkaline Phosphatase 08/07/2013 58  39 - 117 U/L Final  . Total Bilirubin 08/07/2013 0.3  0.3 - 1.2 mg/dL Final  . GFR calc non Af Amer 08/07/2013 46* >90 mL/min Final  . GFR calc Af Amer 08/07/2013 53* >90 mL/min Final   Comment: (NOTE)                          The eGFR has been calculated using the CKD EPI equation.                          This calculation has not been validated in all clinical situations.                          eGFR's persistently <90 mL/min signify possible Chronic Kidney                          Disease.    PATHOLOGY: No new pathology.  Urinalysis No results found for this basename: colorurine, appearanceur, labspec, phurine, glucoseu, hgbur, bilirubinur, ketonesur, proteinur, urobilinogen, nitrite,  leukocytesur    RADIOGRAPHIC STUDIES:    2D Echocardiogram without contrast     Ordering Physician: Alla German, MD  Order# [id number] Study Date: 08/07/13      Patient Information     Name MRN  Description    ABEERA FLANNERY [id number]  62 year old Female             Result Narrative    *[facility]* [address] [place], [place] [address] [phone]  ------------------------------------------------------------ Transthoracic Echocardiography  Patient: Marleah, Beever MR #: [id number] Study Date: 08/07/2013 Gender: F Age: 52 Height: 172.7cm Weight: 81.6kg BSA: 1.64m^2 Pt. Status: Room:  PERFORMING Delma Freeze [facility] SONOGRAPHER Metro Kung, RDCS ATTENDING Armanda Heritage, [place] cc:  ------------------------------------------------------------ LV EF: 60% - 65%  ------------------------------------------------------------ Indications: Neoplasm - breast 174.9.  ------------------------------------------------------------ History: Risk factors: Breast cancer Hypertension. Dyslipidemia.  ------------------------------------------------------------ Study Conclusions  - Left ventricle: The cavity size was normal. Wall thickness was normal. Systolic function was normal. The estimated ejection fraction was in the range of 60% to 65%. Wall motion was normal; there were no regional wall motion abnormalities. There was an increased relative contribution of atrial contraction to ventricular filling. Doppler parameters are consistent with abnormal left ventricular relaxation (grade 1 diastolic dysfunction). - Aortic valve: Mildly calcified annulus. Mildly thickened leaflets. - Mitral valve: Calcified annulus. Mildly thickened leaflets . - Left atrium: The atrium was mildly dilated. Transthoracic echocardiography. M-mode, complete 2D, spectral Doppler, and color Doppler. Height:  Height: 172.7cm. Height: 68in. Weight: Weight: 81.6kg. Weight: 179.6lb. Body mass index: BMI: 27.4kg/m^2. Body surface area: BSA: 1.57m^2. Patient status: Outpatient. Location: Echo laboratory.  ------------------------------------------------------------  ------------------------------------------------------------ Left ventricle: The cavity size was normal. Wall thickness was normal. Systolic function was normal. The estimated ejection fraction was in the range of 60% to 65%. Wall motion was normal; there were no regional wall motion abnormalities. There was an increased relative contribution of atrial contraction to ventricular filling. Doppler parameters are consistent with abnormal left ventricular relaxation (grade 1 diastolic dysfunction). There was no evidence of elevated ventricular filling pressure by Doppler parameters.  ------------------------------------------------------------ Aortic valve: Mildly calcified annulus. Mildly thickened leaflets. Doppler: There was no stenosis. No regurgitation. VTI ratio of LVOT to aortic valve: 0.79. Valve area: 2.42cm^2(VTI). Indexed valve area: 1.24cm^2/m^2 (VTI). Peak velocity ratio of LVOT to aortic valve: 0.73. Valve area: 2.15cm^2 (Vmax). Indexed valve area: 1.1cm^2/m^2 (Vmax). Mean gradient: 10mm Hg (S). Peak gradient: 22mm Hg (S).  ------------------------------------------------------------ Aorta: Aortic root: The aortic root was normal in size.  ------------------------------------------------------------ Mitral valve: Calcified annulus. Mildly thickened leaflets . Leaflet separation was normal. Doppler: Transvalvular velocity was within the normal range. There was no evidence for stenosis. Trivial regurgitation. Peak gradient: 3mm Hg (D).  ------------------------------------------------------------ Left atrium: LA Volume/BSA 27.2 ml/m2. The atrium was mildly  dilated.  ------------------------------------------------------------ Atrial septum: No defect or patent foramen ovale was identified.  ------------------------------------------------------------ Right ventricle: The cavity size was normal. Wall thickness was normal. Systolic function was normal.  ------------------------------------------------------------ Pulmonic valve: The valve appears to be grossly normal. Doppler: No significant regurgitation.  ------------------------------------------------------------ Tricuspid valve: Poorly visualized. Doppler: No significant regurgitation.  ------------------------------------------------------------ Pulmonary artery: Incomplete spectral Doppler envelope to accurately assess PA pressure.  ------------------------------------------------------------ Right atrium: The atrium was normal in size.  ------------------------------------------------------------ Pericardium: There was no pericardial effusion.  ------------------------------------------------------------ Systemic veins: Inferior vena cava: The vessel was normal in size; the respirophasic diameter changes were in the normal range (= 50%); findings are consistent with normal central venous pressure.  ------------------------------------------------------------  2D measurements Normal Doppler measurements Normal Left ventricle Main pulmonary LVID ED, 40.2 mm 43-52 artery chord,  Pressure, 32 mm Hg =30 PLAX S LVID ES, 27.6 mm 23-38 Left ventricle chord, Ea, lat 11.5 cm/s ------ PLAX ann, tiss FS, chord, 31 % >29 DP PLAX E/Ea, lat 6.96 ------ LVPW, ED 8.2 mm ------ ann, tiss IVS/LVPW 0.93 <1.3 DP ratio, ED Ea, med 9.2 cm/s ------ Vol ED, 93 ml ------ ann, tiss MOD1 DP Vol ES, 42 ml ------ E/Ea, med 8.7 ------ MOD1 ann, tiss EF, MOD1 55 % ------ DP Vol index, 48 ml/m^2 ------ LVOT ED, MOD1 Peak vel, 162 cm/s ------ Vol index, 22 ml/m^2 ------ S ES, MOD1 VTI, S  34.1 cm ------ Vol ED, 77 ml ------ Peak 10 mm Hg ------ MOD2 gradient, Vol ES, 33 ml ------ S MOD2 Aortic valve EF, MOD2 57 % ------ Peak vel, 221 cm/s ------ Stroke 44 ml ------ S vol, MOD2 Mean vel, 151 cm/s ------ Vol index, 39 ml/m^2 ------ S ED, MOD2 VTI, S 43.1 cm ------ Vol index, 17 ml/m^2 ------ Mean 10 mm Hg ------ ES, MOD2 gradient, Stroke 22.6 ml/m^2 ------ S index, Peak 22 mm Hg ------ MOD2 gradient, Ventricular septum S IVS, ED 7.61 mm ------ VTI ratio 0.79 ------ LVOT LVOT/AV Diam, S 20 mm ------ Area, VTI 2.42 cm^2 ------ Area 3.14 cm^2 ------ Area index 1.24 cm^2/m ------ Aorta (VTI) ^2 Root diam, 31 mm ------ Peak vel 0.73 ------ ED ratio, Left atrium LVOT/AV AP dim 40 mm ------ Area, Vmax 2.15 cm^2 ------ AP dim 2.05 cm/m^2 <2.2 Area index 1.1 cm^2/m ------ index (Vmax) ^2 Mitral valve Peak E vel 80 cm/s ------ Peak A vel 88.8 cm/s ------ Decelerati 246 ms 150-23 on time 0 Peak 3 mm Hg ------ gradient, D Peak E/A 0.9 ------ ratio Tricuspid valve Regurg 261 cm/s ------ peak vel Peak RV-RA 27 mm Hg ------ gradient, S Max regurg 261 cm/s ------ vel Systemic veins Estimated 5 mm Hg ------ CVP Right ventricle Pressure, 32 mm Hg <30 S Sa vel, 11.8 cm/s ------ lat ann, tiss DP  ------------------------------------------------------------ Prepared and Electronically Authenticated by  Prentice Docker, MD 2014-11-25T18:19:[id number]   .  ASSESSMENT:  #1. Locally advanced right breast cancer, status post neoadjuvant treatment with TCH x6 followed by right modified radical mastectomy and left simple mastectomy, currently receiving pertuzumab and trastuzumab intravenously every 3 weeks, cycle #9 today to complete radiotherapy tomorrow and continuing on oral anastrozole 1 mg daily. #2. Right upper tremulous edema, currently undergoing physical therapy.   PLAN:  #1. Pertuzumab/trastuzumab IV today #2. Continue anastrozole 1 mg daily and  complete radiotherapy tomorrow. #3. Physical therapy for lymphedema prophylaxis to continue. #4. Office visit in 3 weeks for cycle #10 of treatment.   All questions were answered. The patient knows to call the clinic with any problems, questions or concerns. We can certainly see the patient much sooner if necessary.   I spent 25 minutes counseling the patient face to face. The total time spent in the appointment was 30 minutes.    Maurilio Lovely, MD 08/28/2013 12:13 PM

## 2013-08-28 NOTE — Patient Instructions (Signed)
[facility] Discharge Instructions  RECOMMENDATIONS MADE BY THE CONSULTANT AND ANY TEST RESULTS WILL BE SENT TO YOUR REFERRING PHYSICIAN.  EXAM FINDINGS BY THE PHYSICIAN TODAY AND SIGNS OR SYMPTOMS TO REPORT TO CLINIC OR PRIMARY PHYSICIAN: Exam and findings as discussed by Dr Zigmund Daniel.  Will continue with therapy.  Report fevers, chills, uncontrolled nausea or vomiting, etc.  MEDICATIONS PRESCRIBED:  none  INSTRUCTIONS/FOLLOW-UP: Follow-up in 3 weeks with chemotherapy and MD visit.  Thank you for choosing [facility] to provide your oncology and hematology care.  To afford each patient quality time with our providers, please arrive at least 15 minutes before your scheduled appointment time.  With your help, our goal is to use those 15 minutes to complete the necessary work-up to ensure our physicians have the information they need to help with your evaluation and healthcare recommendations.    Effective January 1st, 2014, we ask that you re-schedule your appointment with our physicians should you arrive 10 or more minutes late for your appointment.  We strive to give you quality time with our providers, and arriving late affects you and other patients whose appointments are after yours.    Again, thank you for choosing [facility].  Our hope is that these requests will decrease the amount of time that you wait before being seen by our physicians.       _____________________________________________________________  Should you have questions after your visit to [facility], please contact our office at [phone] between the hours of 8:30 a.m. and 5:00 p.m.  Voicemails left after 4:30 p.m. will not be returned until the following business day.  For prescription refill requests, have your pharmacy contact our office with your prescription refill request.

## 2013-08-28 NOTE — Progress Notes (Signed)
Tolerated chemo well today.  To home accompanied by family member.

## 2013-08-29 ENCOUNTER — Ambulatory Visit (HOSPITAL_COMMUNITY): Payer: Medicaid Other | Attending: Hematology and Oncology

## 2013-09-10 ENCOUNTER — Other Ambulatory Visit (HOSPITAL_COMMUNITY): Payer: Self-pay | Admitting: Oncology

## 2013-09-18 ENCOUNTER — Encounter (HOSPITAL_COMMUNITY): Payer: Medicaid Other | Attending: Oncology

## 2013-09-18 ENCOUNTER — Encounter (HOSPITAL_COMMUNITY): Payer: Self-pay

## 2013-09-18 ENCOUNTER — Encounter (HOSPITAL_BASED_OUTPATIENT_CLINIC_OR_DEPARTMENT_OTHER): Payer: Medicaid Other

## 2013-09-18 VITALS — BP 126/71 | HR 91 | Temp 97.5°F | Resp 18 | Wt 183.2 lb

## 2013-09-18 DIAGNOSIS — C50919 Malignant neoplasm of unspecified site of unspecified female breast: Secondary | ICD-10-CM

## 2013-09-18 DIAGNOSIS — T451X5A Adverse effect of antineoplastic and immunosuppressive drugs, initial encounter: Secondary | ICD-10-CM | POA: Insufficient documentation

## 2013-09-18 DIAGNOSIS — C50911 Malignant neoplasm of unspecified site of right female breast: Secondary | ICD-10-CM

## 2013-09-18 DIAGNOSIS — E8989 Other postprocedural endocrine and metabolic complications and disorders: Secondary | ICD-10-CM

## 2013-09-18 DIAGNOSIS — C50912 Malignant neoplasm of unspecified site of left female breast: Secondary | ICD-10-CM

## 2013-09-18 DIAGNOSIS — I890 Lymphedema, not elsewhere classified: Secondary | ICD-10-CM

## 2013-09-18 DIAGNOSIS — Z5112 Encounter for antineoplastic immunotherapy: Secondary | ICD-10-CM

## 2013-09-18 LAB — CBC WITH DIFFERENTIAL/PLATELET
Basophils Absolute: 0 10*3/uL (ref 0.0–0.1)
Basophils Relative: 0 % (ref 0–1)
Eosinophils Absolute: 0.1 10*3/uL (ref 0.0–0.7)
Eosinophils Relative: 2 % (ref 0–5)
HEMATOCRIT: 37.4 % (ref 36.0–46.0)
HEMOGLOBIN: 13 g/dL (ref 12.0–15.0)
LYMPHS ABS: 0.9 10*3/uL (ref 0.7–4.0)
Lymphocytes Relative: 12 % (ref 12–46)
MCH: 34.1 pg — ABNORMAL HIGH (ref 26.0–34.0)
MCHC: 34.8 g/dL (ref 30.0–36.0)
MCV: 98.2 fL (ref 78.0–100.0)
MONO ABS: 0.7 10*3/uL (ref 0.1–1.0)
MONOS PCT: 9 % (ref 3–12)
NEUTROS ABS: 5.7 10*3/uL (ref 1.7–7.7)
Neutrophils Relative %: 78 % — ABNORMAL HIGH (ref 43–77)
Platelets: 250 10*3/uL (ref 150–400)
RBC: 3.81 MIL/uL — ABNORMAL LOW (ref 3.87–5.11)
RDW: 12.8 % (ref 11.5–15.5)
WBC: 7.4 10*3/uL (ref 4.0–10.5)

## 2013-09-18 MED ORDER — SODIUM CHLORIDE 0.9 % IJ SOLN
10.0000 mL | INTRAMUSCULAR | Status: DC | PRN
  Administered 2013-09-18: 10 mL

## 2013-09-18 MED ORDER — HEPARIN SOD (PORK) LOCK FLUSH 100 UNIT/ML IV SOLN
500.0000 [IU] | Freq: Once | INTRAVENOUS | Status: AC | PRN
  Administered 2013-09-18: 500 [IU]
  Filled 2013-09-18: qty 5

## 2013-09-18 MED ORDER — TRASTUZUMAB CHEMO INJECTION 440 MG
6.0000 mg/kg | Freq: Once | INTRAVENOUS | Status: AC
  Administered 2013-09-18: 504 mg via INTRAVENOUS
  Filled 2013-09-18: qty 24

## 2013-09-18 MED ORDER — HYDROCODONE-ACETAMINOPHEN 5-325 MG PO TABS: 1.0000 | ORAL_TABLET | ORAL | Status: DC | PRN

## 2013-09-18 MED ORDER — SODIUM CHLORIDE 0.9 % IV SOLN
Freq: Once | INTRAVENOUS | Status: AC
  Administered 2013-09-18: 09:00:00 via INTRAVENOUS

## 2013-09-18 MED ORDER — SODIUM CHLORIDE 0.9 % IV SOLN
420.0000 mg | Freq: Once | INTRAVENOUS | Status: AC
  Administered 2013-09-18: 420 mg via INTRAVENOUS
  Filled 2013-09-18: qty 14

## 2013-09-18 NOTE — Progress Notes (Signed)
Per patient she took Tylenol and benadryl at home prior to appointment.

## 2013-09-18 NOTE — Progress Notes (Signed)
[place]  OFFICE PROGRESS NOTE  [name], MD [name] [place] [id number]  DIAGNOSIS: Breast cancer, right - Plan: CBC with Differential  Lymphedema of upper extremity following lymphadenectomy  Breast cancer, unspecified laterality - Plan: HYDROcodone-acetaminophen (NORCO/VICODIN) 5-325 MG per tablet  Chief Complaint  Patient presents with  . Breast Cancer    CURRENT THERAPY: Pertuzumab/trastuzumab/anastrozole for cycle #10 today.  INTERVAL HISTORY: Erika Nunez 63 y.o. female returns for continuation of adjuvant therapy with pertuzumab/trastuzumab/anastrozole for cycle #10 today.  She has had increasing discomfort involving the joints of her hands and feet. Motrin twice a day is helping but not as much as she would like. She is only taking 200 mg each dose. She denies any fever, night sweats, cough, wheezing, skin rash, joint swelling or redness, lower extremity swelling or redness, headache, or seizures.  MEDICAL HISTORY: Past Medical History  Diagnosis Date  . Hypertension   . High cholesterol   . Cancer   . Breast cancer   . Allergy     seasonal allergies  . Arthritis   . Anxiety   . Breast mass, left 2014  . Breast mass, right 2014  . 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 Depression   . Lymphedema of arm     right arm    INTERIM HISTORY: has Breast cancer; Cellulitis of right upper extremity; Hypertension; Hypokalemia; Nausea with vomiting; Neutropenia; Anemia; Thrombocytopenia, unspecified; Antineoplastic chemotherapy induced pancytopenia; Lymphedema of upper extremity following lymphadenectomy; and Delayed postoperative wound closure on her problem list.   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  Estrogen Receptor: 0% NEGATIVE  Progesterone Receptor : 0%,  NEGATIVE  Proliferation Marker Ki67 by M IB-1 (Low<20%): 70%  She had excisional biopsy of the left breast and 10/20/2012.  Breast, excision, left  - INVASIVE MAMMARY CARCINOMA WITH A PREDOMINANTLY LOBULAR PHENOTYPE, GRADE III/III, SPANNING 4.0  CM.  - LOBULAR CARCINOMA IN SITU.  - DUCTAL CARCINOMA IN SITU.  - LYMPHOVASCULAR INVASION IS IDENTIFIED.  - INVASIVE CARCINOMA IS BROADLY PRESENT AT NON-ORIENTED, INKED TISSUE EDGE  Estrogen Receptor 88%,  Progesterone Receptor17%,  Ki67 by M IB-1 (Low<20%): 61%  She has been treated with carboplatin/docetaxel x 6 cycles (10/23/2012- 02/12/2013) plus Herceptin which will continue for 52 weeks. She continues on 3 weekly Herceptin and after her last received decision was made to add Pertuzumab to the regimen.  As documented in her last visit 02/15/2013;Herceptin was recently held due to development of edema. 2D echo was performed on 01/24/2013 and EF was noted to be stable at 60-65%. As a result, Herceptin was re-initiated.  I saw her last on 03/26/2013 at that time I had referred her to radiation oncology/surgery for consideration of local therapy which is continuing with eating or treatments to go in combination with antibody therapy plus anastrozole. She completed radiotherapy currently receiving adjuvant pertuzumab/trastuzumab for cycle #10 of a planned 12 today in conjunction with oral anastrozole.  ALLERGIES:  is allergic to codeine.  MEDICATIONS: has a current medication list which includes the following prescription(s): anastrozole, hydrocodone-acetaminophen, ibuprofen, lidocaine-prilocaine, lisinopril, lisinopril-hydrochlorothiazide, lorazepam, magnesium oxide, potassium chloride sa, pravastatin, trastuzumab, ondansetron, and prochlorperazine, and the following Facility-Administered Medications: sodium chloride.  SURGICAL HISTORY:  Past Surgical History  Procedure Laterality Date  . Cholecystectomy    . Tubal ligation    . Tonsillectomy    .  Breast biopsy  10/02/2012    Procedure: BREAST BIOPSY;  Surgeon: Donato Heinz, MD;  Location: [facility];  Service: General;  Laterality: Right;  . Portacath placement Left 10/20/2012  . Breast biopsy Left 10/20/2012    Procedure: BREAST BIOPSY;  Surgeon: Donato Heinz, MD;  Location: [facility];  Service: General;  Laterality: Left;  Procedure end 1142  . Portacath placement Left 10/20/2012    Procedure: INSERTION PORT-A-CATH;  Surgeon: Donato Heinz, MD;  Location: [facility];  Service: General;  Laterality: Left;  Procedure began 6222; left subclavian  . Simple mastectomy with axillary sentinel node biopsy Bilateral 04/25/2013    Procedure: SIMPLE MASTECTOMY;  Surgeon: Donato Heinz, MD;  Location: [facility];  Service: General;  Laterality: Bilateral;    FAMILY HISTORY: family history includes COPD in her daughter; Cancer in her maternal aunt, maternal aunt, and maternal grandmother; Heart attack in her father; Stroke in her maternal uncle.  SOCIAL HISTORY:  reports that she has never smoked. She has never used smokeless tobacco. She reports that she drinks about 0.6 ounces of alcohol per week. She reports that she does not use illicit drugs.  REVIEW OF SYSTEMS:  Other than that discussed above is noncontributory.  PHYSICAL EXAMINATION: ECOG PERFORMANCE STATUS: 1 - Symptomatic but completely ambulatory  Blood pressure 126/71, pulse 91, temperature 97.5 F (36.4 C), temperature source Oral, resp. rate 18, weight 183 lb 3.2 oz (83.099 kg).  GENERAL:alert, no distress and comfortable. Alopecia is resolving. SKIN: skin color, texture, turgor are normal, no rashes or significant lesions EYES: PERLA; Conjunctiva are pink and non-injected, sclera clear OROPHARYNX:no exudate, no erythema on lips, buccal mucosa, or tongue. NECK: supple, thyroid normal size, non-tender, without nodularity. No masses CHEST: Status post bilateral mastectomy with no subcutaneous nodules. Hyperemia the right chest wall is present  from previous radiation. LYMPH:  no palpable lymphadenopathy in the cervical, axillary or inguinal LUNGS: clear to auscultation and percussion with normal breathing effort HEART: regular rate & rhythm and no murmurs. ABDOMEN:abdomen soft, non-tender and normal bowel sounds MUSCULOSKELETAL:no cyanosis of digits and no clubbing. Range of motion normal. . No lymphedema. NEURO: alert & oriented x 3 with fluent speech, no focal motor/sensory deficits   LABORATORY DATA: Infusion on 09/18/2013  Component Date Value Range Status  . WBC 09/18/2013 7.4  4.0 - 10.5 K/uL Final  . RBC 09/18/2013 3.81* 3.87 - 5.11 MIL/uL Final  . Hemoglobin 09/18/2013 13.0  12.0 - 15.0 g/dL Final  . HCT 09/18/2013 37.4  36.0 - 46.0 % Final  . MCV 09/18/2013 98.2  78.0 - 100.0 fL Final  . MCH 09/18/2013 34.1* 26.0 - 34.0 pg Final  . MCHC 09/18/2013 34.8  30.0 - 36.0 g/dL Final  . RDW 09/18/2013 12.8  11.5 - 15.5 % Final  . Platelets 09/18/2013 250  150 - 400 K/uL Final  . Neutrophils Relative % 09/18/2013 78* 43 - 77 % Final  . Neutro Abs 09/18/2013 5.7  1.7 - 7.7 K/uL Final  . Lymphocytes Relative 09/18/2013 12  12 - 46 % Final  . Lymphs Abs 09/18/2013 0.9  0.7 - 4.0 K/uL Final  . Monocytes Relative 09/18/2013 9  3 - 12 % Final  . Monocytes Absolute 09/18/2013 0.7  0.1 - 1.0 K/uL Final  . Eosinophils Relative 09/18/2013 2  0 - 5 % Final  . Eosinophils Absolute 09/18/2013 0.1  0.0 - 0.7 K/uL Final  . Basophils Relative 09/18/2013 0  0 - 1 % Final  .  Basophils Absolute 09/18/2013 0.0  0.0 - 0.1 K/uL Final  Infusion on 08/28/2013  Component Date Value Range Status  . WBC 08/28/2013 6.1  4.0 - 10.5 K/uL Final  . RBC 08/28/2013 3.56* 3.87 - 5.11 MIL/uL Final  . Hemoglobin 08/28/2013 12.4  12.0 - 15.0 g/dL Final  . HCT 08/28/2013 35.3* 36.0 - 46.0 % Final  . MCV 08/28/2013 99.2  78.0 - 100.0 fL Final  . MCH 08/28/2013 34.8* 26.0 - 34.0 pg Final  . MCHC 08/28/2013 35.1  30.0 - 36.0 g/dL Final  . RDW 08/28/2013  13.2  11.5 - 15.5 % Final  . Platelets 08/28/2013 224  150 - 400 K/uL Final  . Neutrophils Relative % 08/28/2013 72  43 - 77 % Final  . Neutro Abs 08/28/2013 4.4  1.7 - 7.7 K/uL Final  . Lymphocytes Relative 08/28/2013 14  12 - 46 % Final  . Lymphs Abs 08/28/2013 0.8  0.7 - 4.0 K/uL Final  . Monocytes Relative 08/28/2013 12  3 - 12 % Final  . Monocytes Absolute 08/28/2013 0.7  0.1 - 1.0 K/uL Final  . Eosinophils Relative 08/28/2013 2  0 - 5 % Final  . Eosinophils Absolute 08/28/2013 0.1  0.0 - 0.7 K/uL Final  . Basophils Relative 08/28/2013 0  0 - 1 % Final  . Basophils Absolute 08/28/2013 0.0  0.0 - 0.1 K/uL Final  . Sodium 08/28/2013 139  135 - 145 mEq/L Final  . Potassium 08/28/2013 3.9  3.5 - 5.1 mEq/L Final  . Chloride 08/28/2013 102  96 - 112 mEq/L Final  . CO2 08/28/2013 24  19 - 32 mEq/L Final  . Glucose, Bld 08/28/2013 80  70 - 99 mg/dL Final  . BUN 08/28/2013 23  6 - 23 mg/dL Final  . Creatinine, Ser 08/28/2013 1.41* 0.50 - 1.10 mg/dL Final  . Calcium 08/28/2013 9.4  8.4 - 10.5 mg/dL Final  . Total Protein 08/28/2013 6.9  6.0 - 8.3 g/dL Final  . Albumin 08/28/2013 4.2  3.5 - 5.2 g/dL Final  . AST 08/28/2013 12  0 - 37 U/L Final  . ALT 08/28/2013 8  0 - 35 U/L Final  . Alkaline Phosphatase 08/28/2013 55  39 - 117 U/L Final  . Total Bilirubin 08/28/2013 0.4  0.3 - 1.2 mg/dL Final  . GFR calc non Af Amer 08/28/2013 39* >90 mL/min Final  . GFR calc Af Amer 08/28/2013 45* >90 mL/min Final   Comment: (NOTE)                          The eGFR has been calculated using the CKD EPI equation.                          This calculation has not been validated in all clinical situations.                          eGFR's persistently <90 mL/min signify possible Chronic Kidney                          Disease.    PATHOLOGY: No new pathology.  Urinalysis No results found for this basename: colorurine,  appearanceur,  labspec,  phurine,  glucoseu,  hgbur,  bilirubinur,  ketonesur,   proteinur,  urobilinogen,  nitrite,  leukocytesur    RADIOGRAPHIC STUDIES: No results found.  ASSESSMENT:  #1. Locally advanced right breast cancer, status post neoadjuvant treatment with Isabela x6 followed by right modified radical mastectomy and left simple mastectomy, currently receiving pertuzumab and trastuzumab intravenously every 3 weeks, cycle #10 today having completed radiotherapy 08/29/2013 and continuing on oral anastrozole 1 mg daily.  #2. Right upper extremity edema,, currently undergoing physical therapy, wearing a sleeve, improved. #3. Her discomfort secondary to anastrozole.    PLAN:  #1. Pertuzumab/trastuzumab intravenously today. #2. Continue anastrozole 1 mg daily. #3. Aleve 1 tablet twice a day to control joint discomfort. Continue active lifestyle. #4. Return in 3 weeks for cycle #11 of a planned 12. Echocardiogram will be repeated at the conclusion of the 12th cycle.   All questions were answered. The patient knows to call the clinic with any problems, questions or concerns. We can certainly see the patient much sooner if necessary.   I spent 25 minutes counseling the patient face to face. The total time spent in the appointment was 30 minutes.    Doroteo Bradford, MD 09/18/2013 10:39 AM

## 2013-09-18 NOTE — Patient Instructions (Signed)
.  [facility] Discharge Instructions  RECOMMENDATIONS MADE BY THE CONSULTANT AND ANY TEST RESULTS WILL BE SENT TO YOUR REFERRING PHYSICIAN.  EXAM FINDINGS BY THE PHYSICIAN TODAY AND SIGNS OR SYMPTOMS TO REPORT TO CLINIC OR PRIMARY PHYSICIAN: Exam and findings as discussed by Dr. Barnet Glasgow.  MEDICATIONS PRESCRIBED:  Hydrocodone prescribed  INSTRUCTIONS/FOLLOW-UP: 3 weeks  Thank you for choosing [facility] to provide your oncology and hematology care.  To afford each patient quality time with our providers, please arrive at least 15 minutes before your scheduled appointment time.  With your help, our goal is to use those 15 minutes to complete the necessary work-up to ensure our physicians have the information they need to help with your evaluation and healthcare recommendations.    Effective January 1st, 2014, we ask that you re-schedule your appointment with our physicians should you arrive 10 or more minutes late for your appointment.  We strive to give you quality time with our providers, and arriving late affects you and other patients whose appointments are after yours.    Again, thank you for choosing [facility].  Our hope is that these requests will decrease the amount of time that you wait before being seen by our physicians.       _____________________________________________________________  Should you have questions after your visit to [facility], please contact our office at [phone] between the hours of 8:30 a.m. and 5:00 p.m.  Voicemails left after 4:30 p.m. will not be returned until the following business day.  For prescription refill requests, have your pharmacy contact our office with your prescription refill request.

## 2013-10-09 ENCOUNTER — Encounter (HOSPITAL_BASED_OUTPATIENT_CLINIC_OR_DEPARTMENT_OTHER): Payer: Medicaid Other

## 2013-10-09 ENCOUNTER — Encounter (HOSPITAL_COMMUNITY): Payer: Self-pay

## 2013-10-09 VITALS — BP 138/66 | HR 71 | Temp 97.2°F | Resp 18 | Wt 188.0 lb

## 2013-10-09 DIAGNOSIS — C50911 Malignant neoplasm of unspecified site of right female breast: Secondary | ICD-10-CM

## 2013-10-09 DIAGNOSIS — Z5112 Encounter for antineoplastic immunotherapy: Secondary | ICD-10-CM

## 2013-10-09 DIAGNOSIS — R609 Edema, unspecified: Secondary | ICD-10-CM

## 2013-10-09 DIAGNOSIS — C773 Secondary and unspecified malignant neoplasm of axilla and upper limb lymph nodes: Secondary | ICD-10-CM

## 2013-10-09 DIAGNOSIS — C50919 Malignant neoplasm of unspecified site of unspecified female breast: Secondary | ICD-10-CM

## 2013-10-09 LAB — CBC WITH DIFFERENTIAL/PLATELET
Basophils Absolute: 0 10*3/uL (ref 0.0–0.1)
Basophils Relative: 0 % (ref 0–1)
EOS ABS: 0.3 10*3/uL (ref 0.0–0.7)
Eosinophils Relative: 5 % (ref 0–5)
HCT: 37.3 % (ref 36.0–46.0)
HEMOGLOBIN: 13.1 g/dL (ref 12.0–15.0)
LYMPHS ABS: 1 10*3/uL (ref 0.7–4.0)
Lymphocytes Relative: 16 % (ref 12–46)
MCH: 34.1 pg — AB (ref 26.0–34.0)
MCHC: 35.1 g/dL (ref 30.0–36.0)
MCV: 97.1 fL (ref 78.0–100.0)
Monocytes Absolute: 0.7 10*3/uL (ref 0.1–1.0)
Monocytes Relative: 12 % (ref 3–12)
NEUTROS ABS: 3.9 10*3/uL (ref 1.7–7.7)
NEUTROS PCT: 67 % (ref 43–77)
PLATELETS: 225 10*3/uL (ref 150–400)
RBC: 3.84 MIL/uL — ABNORMAL LOW (ref 3.87–5.11)
RDW: 13 % (ref 11.5–15.5)
WBC: 5.8 10*3/uL (ref 4.0–10.5)

## 2013-10-09 MED ORDER — HEPARIN SOD (PORK) LOCK FLUSH 100 UNIT/ML IV SOLN
500.0000 [IU] | Freq: Once | INTRAVENOUS | Status: AC | PRN
  Administered 2013-10-09: 500 [IU]
  Filled 2013-10-09: qty 5

## 2013-10-09 MED ORDER — SODIUM CHLORIDE 0.9 % IV SOLN
Freq: Once | INTRAVENOUS | Status: AC
  Administered 2013-10-09: 20 mL/h via INTRAVENOUS

## 2013-10-09 MED ORDER — SODIUM CHLORIDE 0.9 % IJ SOLN
10.0000 mL | INTRAMUSCULAR | Status: DC | PRN
  Administered 2013-10-09: 10 mL

## 2013-10-09 MED ORDER — TRASTUZUMAB CHEMO INJECTION 440 MG
6.0000 mg/kg | Freq: Once | INTRAVENOUS | Status: AC
  Administered 2013-10-09: 504 mg via INTRAVENOUS
  Filled 2013-10-09: qty 24

## 2013-10-09 MED ORDER — SODIUM CHLORIDE 0.9 % IV SOLN
420.0000 mg | Freq: Once | INTRAVENOUS | Status: AC
  Administered 2013-10-09: 420 mg via INTRAVENOUS
  Filled 2013-10-09: qty 14

## 2013-10-09 NOTE — Progress Notes (Signed)
Erika Nunez Took benadryl and tylenol prior to coming to the hospital for treatment   Tolerated chemotherapy well today

## 2013-10-09 NOTE — Patient Instructions (Signed)
.  [facility] Discharge Instructions  RECOMMENDATIONS MADE BY THE CONSULTANT AND ANY TEST RESULTS WILL BE SENT TO YOUR REFERRING PHYSICIAN.  EXAM FINDINGS BY THE PHYSICIAN TODAY AND SIGNS OR SYMPTOMS TO REPORT TO CLINIC OR PRIMARY PHYSICIAN: Exam and findings as discussed by Dr. Barnet Glasgow.   INSTRUCTIONS/FOLLOW-UP: 3 weeks to see the Dr. And get your LAST treatment! 2 d echo to be done after last treatment  Thank you for choosing [facility] to provide your oncology and hematology care.  To afford each patient quality time with our providers, please arrive at least 15 minutes before your scheduled appointment time.  With your help, our goal is to use those 15 minutes to complete the necessary work-up to ensure our physicians have the information they need to help with your evaluation and healthcare recommendations.    Effective January 1st, 2014, we ask that you re-schedule your appointment with our physicians should you arrive 10 or more minutes late for your appointment.  We strive to give you quality time with our providers, and arriving late affects you and other patients whose appointments are after yours.    Again, thank you for choosing [facility].  Our hope is that these requests will decrease the amount of time that you wait before being seen by our physicians.       _____________________________________________________________  Should you have questions after your visit to [facility], please contact our office at [phone] between the hours of 8:30 a.m. and 5:00 p.m.  Voicemails left after 4:30 p.m. will not be returned until the following business day.  For prescription refill requests, have your pharmacy contact our office with your prescription refill request.

## 2013-10-09 NOTE — Progress Notes (Signed)
[place]  OFFICE PROGRESS NOTE  [name], MD [name] [place] [address]  DIAGNOSIS: Breast cancer, right  Chief Complaint  Patient presents with  . Breast Cancer    Adjuvant pertuzumab/trastuzumab/anastrozole cycle #11 today  . Follow-up    CURRENT THERAPY: Anastrozole/pertuzumab/trastuzumab  INTERVAL HISTORY: Erika Nunez 63 y.o. female returns for followup and to receive cycle #11 of adjuvant pertuzumab/trastuzumab for HER-2 positive breast cancer while also taking anastrozole 1 mg daily. She does have joint discomfort involving the knees, hands, and feet which is controlled by 3 Motrin tablets per day. She denies any hot flashes or vaginal dryness or irritation. She also denies any lower extremity swelling or redness, chest pain, PND, orthopnea, palpitations, headache, skin rash, or seizures.  MEDICAL HISTORY: Past Medical History  Diagnosis Date  . Hypertension   . High cholesterol   . Cancer   . Breast cancer   . Allergy     seasonal allergies  . Arthritis   . Anxiety   . Breast mass, left 2014  . Breast mass, right 2014  . 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 Depression   . Lymphedema of arm     right arm    INTERIM HISTORY: has Breast cancer; Cellulitis of right upper extremity; Hypertension; Hypokalemia; Nausea with vomiting; Neutropenia; Anemia; Thrombocytopenia, unspecified; Antineoplastic chemotherapy induced pancytopenia; Lymphedema of upper extremity following lymphadenectomy; and Delayed postoperative wound closure on her problem list.   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  Estrogen Receptor: 0% NEGATIVE  Progesterone Receptor : 0%, NEGATIVE  Proliferation Marker Ki67 by M IB-1 (Low<20%): 70%  She had excisional biopsy of the left breast and  10/20/2012.  Breast, excision, left  - INVASIVE MAMMARY CARCINOMA WITH A PREDOMINANTLY LOBULAR PHENOTYPE, GRADE III/III, SPANNING 4.0  CM.  - LOBULAR CARCINOMA IN SITU.  - DUCTAL CARCINOMA IN SITU.  - LYMPHOVASCULAR INVASION IS IDENTIFIED.  - INVASIVE CARCINOMA IS BROADLY PRESENT AT NON-ORIENTED, INKED TISSUE EDGE  Estrogen Receptor 88%,  Progesterone Receptor17%,  Ki67 by M IB-1 (Low<20%): 61%  She has been treated with carboplatin/docetaxel x 6 cycles (10/23/2012- 02/12/2013) plus Herceptin which will continue for 52 weeks. She continues on 3 weekly Herceptin and after her last received decision was made to add Pertuzumab to the regimen.  As documented in her last visit 02/15/2013;Herceptin was recently held due to development of edema. 2D echo was performed on 01/24/2013 and EF was noted to be stable at 60-65%. As a result, Herceptin was re-initiated.  I saw her last on 03/26/2013 at that time I had referred her to radiation oncology/surgery for consideration of local therapy which is continuing with eating or treatments to go in combination with antibody therapy plus anastrozole. She completed radiotherapy currently receiving adjuvant pertuzumab/trastuzumab for cycle #10 of a planned 12 today in conjunction with oral anastrozole  ALLERGIES:  is allergic to codeine.  MEDICATIONS: has a current medication list which includes the following prescription(s): anastrozole, hydrocodone-acetaminophen, ibuprofen, lidocaine-prilocaine, lisinopril, lisinopril-hydrochlorothiazide, lorazepam, magnesium oxide, ondansetron, potassium chloride sa, pravastatin, trastuzumab, and prochlorperazine, and the following Facility-Administered Medications: heparin lock flush, sodium chloride, and trastuzumab (HERCEPTIN) 504 mg in sodium chloride 0.9 % 250 mL chemo infusion.  SURGICAL HISTORY:  Past Surgical History  Procedure Laterality Date  . Cholecystectomy    . Tubal ligation    . Tonsillectomy    .  Breast biopsy   10/02/2012    Procedure: BREAST BIOPSY;  Surgeon: Donato Heinz, MD;  Location: [facility];  Service: General;  Laterality: Right;  . Portacath placement Left 10/20/2012  . Breast biopsy Left 10/20/2012    Procedure: BREAST BIOPSY;  Surgeon: Donato Heinz, MD;  Location: [facility];  Service: General;  Laterality: Left;  Procedure end 1142  . Portacath placement Left 10/20/2012    Procedure: INSERTION PORT-A-CATH;  Surgeon: Donato Heinz, MD;  Location: [facility];  Service: General;  Laterality: Left;  Procedure began 6333; left subclavian  . Simple mastectomy with axillary sentinel node biopsy Bilateral 04/25/2013    Procedure: SIMPLE MASTECTOMY;  Surgeon: Donato Heinz, MD;  Location: [facility];  Service: General;  Laterality: Bilateral;    FAMILY HISTORY: family history includes COPD in her daughter; Cancer in her maternal aunt, maternal aunt, and maternal grandmother; Heart attack in her father; Stroke in her maternal uncle.  SOCIAL HISTORY:  reports that she has never smoked. She has never used smokeless tobacco. She reports that she drinks about 0.6 ounces of alcohol per week. She reports that she does not use illicit drugs.  REVIEW OF SYSTEMS:  Other than that discussed above is noncontributory.  PHYSICAL EXAMINATION: ECOG PERFORMANCE STATUS: 1 - Symptomatic but completely ambulatory  Blood pressure 138/66, pulse 71, temperature 97.2 F (36.2 C), temperature source Oral, resp. rate 18, weight 188 lb (85.276 kg).  GENERAL:alert, no distress and comfortable. Alopecia resolved. SKIN: skin color, texture, turgor are normal, no rashes or significant lesions EYES: PERLA; Conjunctiva are pink and non-injected, sclera clear OROPHARYNX:no exudate, no erythema on lips, buccal mucosa, or tongue. NECK: supple, thyroid normal size, non-tender, without nodularity. No masses CHEST: Status post bilateral mastectomy with no subcutaneous nodules. LYMPH:  no palpable lymphadenopathy in the cervical, axillary or  inguinal LUNGS: clear to auscultation and percussion with normal breathing effort HEART: regular rate & rhythm and no murmurs. ABDOMEN:abdomen soft, non-tender and normal bowel sounds MUSCULOSKELETAL:no cyanosis of digits and no clubbing. Range of motion normal.  NEURO: alert & oriented x 3 with fluent speech, no focal motor/sensory deficits   LABORATORY DATA: Infusion on 10/09/2013  Component Date Value Range Status  . WBC 10/09/2013 5.8  4.0 - 10.5 K/uL Final  . RBC 10/09/2013 3.84* 3.87 - 5.11 MIL/uL Final  . Hemoglobin 10/09/2013 13.1  12.0 - 15.0 g/dL Final  . HCT 10/09/2013 37.3  36.0 - 46.0 % Final  . MCV 10/09/2013 97.1  78.0 - 100.0 fL Final  . MCH 10/09/2013 34.1* 26.0 - 34.0 pg Final  . MCHC 10/09/2013 35.1  30.0 - 36.0 g/dL Final  . RDW 10/09/2013 13.0  11.5 - 15.5 % Final  . Platelets 10/09/2013 225  150 - 400 K/uL Final  . Neutrophils Relative % 10/09/2013 67  43 - 77 % Final  . Neutro Abs 10/09/2013 3.9  1.7 - 7.7 K/uL Final  . Lymphocytes Relative 10/09/2013 16  12 - 46 % Final  . Lymphs Abs 10/09/2013 1.0  0.7 - 4.0 K/uL Final  . Monocytes Relative 10/09/2013 12  3 - 12 % Final  . Monocytes Absolute 10/09/2013 0.7  0.1 - 1.0 K/uL Final  . Eosinophils Relative 10/09/2013 5  0 - 5 % Final  . Eosinophils Absolute 10/09/2013 0.3  0.0 - 0.7 K/uL Final  . Basophils Relative 10/09/2013 0  0 - 1 % Final  . Basophils Absolute 10/09/2013 0.0  0.0 - 0.1 K/uL Final  Infusion  on 09/18/2013  Component Date Value Range Status  . WBC 09/18/2013 7.4  4.0 - 10.5 K/uL Final  . RBC 09/18/2013 3.81* 3.87 - 5.11 MIL/uL Final  . Hemoglobin 09/18/2013 13.0  12.0 - 15.0 g/dL Final  . HCT 09/18/2013 37.4  36.0 - 46.0 % Final  . MCV 09/18/2013 98.2  78.0 - 100.0 fL Final  . MCH 09/18/2013 34.1* 26.0 - 34.0 pg Final  . MCHC 09/18/2013 34.8  30.0 - 36.0 g/dL Final  . RDW 09/18/2013 12.8  11.5 - 15.5 % Final  . Platelets 09/18/2013 250  150 - 400 K/uL Final  . Neutrophils Relative %  09/18/2013 78* 43 - 77 % Final  . Neutro Abs 09/18/2013 5.7  1.7 - 7.7 K/uL Final  . Lymphocytes Relative 09/18/2013 12  12 - 46 % Final  . Lymphs Abs 09/18/2013 0.9  0.7 - 4.0 K/uL Final  . Monocytes Relative 09/18/2013 9  3 - 12 % Final  . Monocytes Absolute 09/18/2013 0.7  0.1 - 1.0 K/uL Final  . Eosinophils Relative 09/18/2013 2  0 - 5 % Final  . Eosinophils Absolute 09/18/2013 0.1  0.0 - 0.7 K/uL Final  . Basophils Relative 09/18/2013 0  0 - 1 % Final  . Basophils Absolute 09/18/2013 0.0  0.0 - 0.1 K/uL Final    PATHOLOGY: No new pathology.  Urinalysis No results found for this basename: colorurine,  appearanceur,  labspec,  phurine,  glucoseu,  hgbur,  bilirubinur,  ketonesur,  proteinur,  urobilinogen,  nitrite,  leukocytesur    RADIOGRAPHIC STUDIES: Last echocardiogram done in November of 2014 showed an ejection fraction between 60 and 65%.  ASSESSMENT:  #1. Locally advanced right breast cancer, status post neoadjuvant treatment with [place] x6 followed by right modified radical mastectomy and left simple mastectomy, currently receiving pertuzumab and trastuzumab intravenously every 3 weeks, cycle #11 today having completed radiotherapy 08/29/2013 and continuing on oral anastrozole 1 mg daily.  #2. Right upper extremity edema,, currently undergoing physical therapy, wearing a sleeve, improved.  #3. Joint discomfort secondary to anastrozole, controlled with Motrin total of 600 mg daily.   PLAN:  #1. Intravenous pertuzumab/trastuzumab today. #2. Continue anastrozole 1 mg daily. #3. Followup in 3 weeks to receive final cycle of antibody therapy following which repeat 2-D echo will be done.   All questions were answered. The patient knows to call the clinic with any problems, questions or concerns. We can certainly see the patient much sooner if necessary.   I spent 25 minutes counseling the patient face to face. The total time spent in the appointment was 30 minutes.      Doroteo Bradford, MD 10/09/2013 10:28 AM

## 2013-10-22 ENCOUNTER — Other Ambulatory Visit (HOSPITAL_COMMUNITY): Payer: Self-pay

## 2013-10-30 ENCOUNTER — Inpatient Hospital Stay (HOSPITAL_COMMUNITY): Payer: Medicaid Other

## 2013-10-30 ENCOUNTER — Other Ambulatory Visit (HOSPITAL_COMMUNITY): Payer: Medicaid Other

## 2013-10-30 ENCOUNTER — Ambulatory Visit (HOSPITAL_COMMUNITY): Payer: Medicaid Other

## 2013-11-01 ENCOUNTER — Other Ambulatory Visit (HOSPITAL_COMMUNITY): Payer: Medicaid Other

## 2013-11-01 ENCOUNTER — Ambulatory Visit (HOSPITAL_COMMUNITY): Payer: Medicaid Other

## 2013-11-01 ENCOUNTER — Inpatient Hospital Stay (HOSPITAL_COMMUNITY): Payer: Medicaid Other

## 2013-11-05 ENCOUNTER — Encounter (HOSPITAL_BASED_OUTPATIENT_CLINIC_OR_DEPARTMENT_OTHER): Payer: Medicaid Other

## 2013-11-05 ENCOUNTER — Ambulatory Visit (HOSPITAL_COMMUNITY)
Admission: RE | Admit: 2013-11-05 | Discharge: 2013-11-05 | Disposition: A | Discharge status: Home / Self Care | Payer: Medicaid Other | Source: Ambulatory Visit | Attending: Hematology and Oncology | Admitting: Hematology and Oncology

## 2013-11-05 ENCOUNTER — Encounter (HOSPITAL_COMMUNITY): Payer: Self-pay

## 2013-11-05 ENCOUNTER — Encounter (HOSPITAL_COMMUNITY): Payer: Medicaid Other | Attending: Oncology

## 2013-11-05 ENCOUNTER — Other Ambulatory Visit (HOSPITAL_COMMUNITY): Payer: Self-pay | Admitting: Hematology and Oncology

## 2013-11-05 VITALS — BP 122/71 | HR 97 | Temp 97.7°F | Resp 18 | Wt 191.0 lb

## 2013-11-05 VITALS — BP 114/57 | HR 84 | Temp 97.7°F | Resp 20

## 2013-11-05 DIAGNOSIS — M259 Joint disorder, unspecified: Secondary | ICD-10-CM

## 2013-11-05 DIAGNOSIS — C50919 Malignant neoplasm of unspecified site of unspecified female breast: Secondary | ICD-10-CM | POA: Insufficient documentation

## 2013-11-05 DIAGNOSIS — I10 Essential (primary) hypertension: Secondary | ICD-10-CM | POA: Insufficient documentation

## 2013-11-05 DIAGNOSIS — Z5112 Encounter for antineoplastic immunotherapy: Secondary | ICD-10-CM

## 2013-11-05 DIAGNOSIS — G609 Hereditary and idiopathic neuropathy, unspecified: Secondary | ICD-10-CM

## 2013-11-05 DIAGNOSIS — C50911 Malignant neoplasm of unspecified site of right female breast: Secondary | ICD-10-CM

## 2013-11-05 DIAGNOSIS — E785 Hyperlipidemia, unspecified: Secondary | ICD-10-CM | POA: Insufficient documentation

## 2013-11-05 DIAGNOSIS — I517 Cardiomegaly: Secondary | ICD-10-CM

## 2013-11-05 DIAGNOSIS — Z170 Estrogen receptor positive status [ER+]: Secondary | ICD-10-CM

## 2013-11-05 DIAGNOSIS — Z9221 Personal history of antineoplastic chemotherapy: Secondary | ICD-10-CM | POA: Insufficient documentation

## 2013-11-05 DIAGNOSIS — I890 Lymphedema, not elsewhere classified: Secondary | ICD-10-CM

## 2013-11-05 DIAGNOSIS — T451X5A Adverse effect of antineoplastic and immunosuppressive drugs, initial encounter: Secondary | ICD-10-CM | POA: Insufficient documentation

## 2013-11-05 LAB — CBC WITH DIFFERENTIAL/PLATELET
Basophils Absolute: 0 10*3/uL (ref 0.0–0.1)
Basophils Relative: 0 % (ref 0–1)
Eosinophils Absolute: 0.2 10*3/uL (ref 0.0–0.7)
Eosinophils Relative: 2 % (ref 0–5)
HCT: 38.2 % (ref 36.0–46.0)
HEMOGLOBIN: 13.4 g/dL (ref 12.0–15.0)
LYMPHS ABS: 1.2 10*3/uL (ref 0.7–4.0)
LYMPHS PCT: 13 % (ref 12–46)
MCH: 34.2 pg — ABNORMAL HIGH (ref 26.0–34.0)
MCHC: 35.1 g/dL (ref 30.0–36.0)
MCV: 97.4 fL (ref 78.0–100.0)
MONOS PCT: 8 % (ref 3–12)
Monocytes Absolute: 0.7 10*3/uL (ref 0.1–1.0)
NEUTROS PCT: 77 % (ref 43–77)
Neutro Abs: 6.8 10*3/uL (ref 1.7–7.7)
Platelets: 234 10*3/uL (ref 150–400)
RBC: 3.92 MIL/uL (ref 3.87–5.11)
RDW: 12.8 % (ref 11.5–15.5)
WBC: 8.8 10*3/uL (ref 4.0–10.5)

## 2013-11-05 MED ORDER — SODIUM CHLORIDE 0.9 % IJ SOLN
10.0000 mL | INTRAMUSCULAR | Status: DC | PRN
  Administered 2013-11-05: 10 mL

## 2013-11-05 MED ORDER — TRASTUZUMAB CHEMO INJECTION 440 MG
6.0000 mg/kg | Freq: Once | INTRAVENOUS | Status: AC
  Administered 2013-11-05: 504 mg via INTRAVENOUS
  Filled 2013-11-05: qty 24

## 2013-11-05 MED ORDER — HEPARIN SOD (PORK) LOCK FLUSH 100 UNIT/ML IV SOLN
500.0000 [IU] | Freq: Once | INTRAVENOUS | Status: AC | PRN
  Administered 2013-11-05: 500 [IU]

## 2013-11-05 MED ORDER — SODIUM CHLORIDE 0.9 % IV SOLN
420.0000 mg | Freq: Once | INTRAVENOUS | Status: AC
  Administered 2013-11-05: 420 mg via INTRAVENOUS
  Filled 2013-11-05: qty 14

## 2013-11-05 MED ORDER — SODIUM CHLORIDE 0.9 % IV SOLN
Freq: Once | INTRAVENOUS | Status: AC
  Administered 2013-11-05: 10:00:00 via INTRAVENOUS

## 2013-11-05 MED ORDER — HEPARIN SOD (PORK) LOCK FLUSH 100 UNIT/ML IV SOLN
INTRAVENOUS | Status: AC
  Filled 2013-11-05: qty 5

## 2013-11-05 NOTE — Progress Notes (Signed)
Patient took Tylenol and Benadryl at home.  Tolerated her last Perjeta/herceptin without any difficulty.

## 2013-11-05 NOTE — Patient Instructions (Signed)
[facility] Discharge Instructions  RECOMMENDATIONS MADE BY THE CONSULTANT AND ANY TEST RESULTS WILL BE SENT TO YOUR REFERRING PHYSICIAN.  EXAM FINDINGS BY THE PHYSICIAN TODAY AND SIGNS OR SYMPTOMS TO REPORT TO CLINIC OR PRIMARY PHYSICIAN: Exam and findings as discussed by Dr. Barnet Glasgow.  You are doing well.  Report any new lumps, bone pain, shortness of breath or other symptoms.  MEDICATIONS PRESCRIBED:  none  INSTRUCTIONS/FOLLOW-UP: Echo as scheduled today, port flush every 6 weeks, labs and office visit in 8 weeks.  Thank you for choosing [facility] to provide your oncology and hematology care.  To afford each patient quality time with our providers, please arrive at least 15 minutes before your scheduled appointment time.  With your help, our goal is to use those 15 minutes to complete the necessary work-up to ensure our physicians have the information they need to help with your evaluation and healthcare recommendations.    Effective January 1st, 2014, we ask that you re-schedule your appointment with our physicians should you arrive 10 or more minutes late for your appointment.  We strive to give you quality time with our providers, and arriving late affects you and other patients whose appointments are after yours.    Again, thank you for choosing [facility].  Our hope is that these requests will decrease the amount of time that you wait before being seen by our physicians.       _____________________________________________________________  Should you have questions after your visit to [facility], please contact our office at [phone] between the hours of 8:30 a.m. and 5:00 p.m.  Voicemails left after 4:30 p.m. will not be returned until the following business day.  For prescription refill requests, have your pharmacy contact our office with your prescription refill request.

## 2013-11-05 NOTE — Progress Notes (Signed)
*  PRELIMINARY RESULTS* Echocardiogram 2D Echocardiogram has been performed.  [place], [place] 11/05/2013, 2:54 PM

## 2013-11-05 NOTE — Progress Notes (Signed)
[place]  OFFICE PROGRESS NOTE  [name], MD [name] [place] [address]  DIAGNOSIS: Breast cancer - Plan: CANCELED: 2D Echocardiogram without contrast  Chief Complaint  Patient presents with  . Breast cancer, HER-2/neu positive    Final cycle of pertuzumab/trastuzumab plus anastrozole    CURRENT THERAPY: Pertuzumab/trastuzumab IV every 3 weeks for cycle #12 today plus oral anastrozole  INTERVAL HISTORY: Erika Nunez 63 y.o. female returns for completion of one year of adjuvant therapy for HER-2 positive breast cancer, planning to receive pertuzumab/trastuzumab cycle #12 today while continuing on anastrozole 1 mg daily out to 5 years.  She is using her lymphedema sleeve because of chronic right upper extremity swelling. She still has minimal residual paresthesias involving the feet but these are gradually improving as well. Appetite is good with no nausea, vomiting, diarrhea, constipation, dysuria, hematuria, vaginal bleeding or dryness, joint pain or muscle aches better not controlled by Motrin, lower extremity swelling or redness, chest pain, PND, orthopnea, palpitations, headache, or seizures. She also denies any skin rash.  MEDICAL HISTORY: Past Medical History  Diagnosis Date  . Hypertension   . High cholesterol   . Cancer   . Breast cancer   . Allergy     seasonal allergies  . Arthritis   . Anxiety   . Breast mass, left 2014  . Breast mass, right 2014  . 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 Depression   . Lymphedema of arm     right arm    INTERIM HISTORY: has Breast cancer; Cellulitis of right upper extremity; Hypertension; Hypokalemia; Nausea with vomiting; Neutropenia; Anemia; Thrombocytopenia, unspecified; Antineoplastic chemotherapy induced pancytopenia; Lymphedema of upper extremity following lymphadenectomy; and Delayed postoperative wound closure on her problem  list.    ALLERGIES:  is allergic to codeine.  MEDICATIONS: has a current medication list which includes the following prescription(s): anastrozole, hydrocodone-acetaminophen, ibuprofen, lidocaine-prilocaine, lisinopril, lisinopril-hydrochlorothiazide, lorazepam, magnesium oxide, ondansetron, potassium chloride sa, pravastatin, prochlorperazine, and trastuzumab.  SURGICAL HISTORY:  Past Surgical History  Procedure Laterality Date  . Cholecystectomy    . Tubal ligation    . Tonsillectomy    . Breast biopsy  10/02/2012    Procedure: BREAST BIOPSY;  Surgeon: Donato Heinz, MD;  Location: [facility];  Service: General;  Laterality: Right;  . Portacath placement Left 10/20/2012  . Breast biopsy Left 10/20/2012    Procedure: BREAST BIOPSY;  Surgeon: Donato Heinz, MD;  Location: [facility];  Service: General;  Laterality: Left;  Procedure end 1142  . Portacath placement Left 10/20/2012    Procedure: INSERTION PORT-A-CATH;  Surgeon: Donato Heinz, MD;  Location: [facility];  Service: General;  Laterality: Left;  Procedure began 8850; left subclavian  . Simple mastectomy with axillary sentinel node biopsy Bilateral 04/25/2013    Procedure: SIMPLE MASTECTOMY;  Surgeon: Donato Heinz, MD;  Location: [facility];  Service: General;  Laterality: Bilateral;    FAMILY HISTORY: family history includes COPD in her daughter; Cancer in her maternal aunt, maternal aunt, and maternal grandmother; Heart attack in her father; Stroke in her maternal uncle.  SOCIAL HISTORY:  reports that she has never smoked. She has never used smokeless tobacco. She reports that she drinks about 0.6 ounces of alcohol per week. She reports that she does not use illicit drugs.  REVIEW OF SYSTEMS:  Other than that discussed above is noncontributory.  PHYSICAL EXAMINATION: ECOG PERFORMANCE STATUS: 1 -  Symptomatic but completely ambulatory  Blood pressure 122/71, pulse 97, temperature 97.7 F (36.5 C), resp. rate 18, weight 191 lb (86.637  kg).  GENERAL:alert, no distress and comfortable SKIN: skin color, texture, turgor are normal, no rashes or significant lesions EYES: PERLA; Conjunctiva are pink and non-injected, sclera clear OROPHARYNX:no exudate, no erythema on lips, buccal mucosa, or tongue. NECK: supple, thyroid normal size, non-tender, without nodularity. No masses CHEST: Status post bilateral mastectomy with no subcutaneous nodules. LYMPH:  no palpable lymphadenopathy in the cervical, axillary or inguinal LUNGS: clear to auscultation and percussion with normal breathing effort HEART: regular rate & rhythm and no murmurs. No S3. ABDOMEN:abdomen soft, non-tender and normal bowel sounds MUSCULOSKELETAL:no cyanosis of digits and no clubbing. Range of motion normal. Pulse 1 lymphedema right upper extremity.  NEURO: alert & oriented x 3 with fluent speech, no focal motor/sensory deficits   LABORATORY DATA: Infusion on 11/05/2013  Component Date Value Ref Range Status  . WBC 11/05/2013 8.8  4.0 - 10.5 K/uL Final  . RBC 11/05/2013 3.92  3.87 - 5.11 MIL/uL Final  . Hemoglobin 11/05/2013 13.4  12.0 - 15.0 g/dL Final  . HCT 11/05/2013 38.2  36.0 - 46.0 % Final  . MCV 11/05/2013 97.4  78.0 - 100.0 fL Final  . MCH 11/05/2013 34.2* 26.0 - 34.0 pg Final  . MCHC 11/05/2013 35.1  30.0 - 36.0 g/dL Final  . RDW 11/05/2013 12.8  11.5 - 15.5 % Final  . Platelets 11/05/2013 234  150 - 400 K/uL Final  . Neutrophils Relative % 11/05/2013 77  43 - 77 % Final  . Neutro Abs 11/05/2013 6.8  1.7 - 7.7 K/uL Final  . Lymphocytes Relative 11/05/2013 13  12 - 46 % Final  . Lymphs Abs 11/05/2013 1.2  0.7 - 4.0 K/uL Final  . Monocytes Relative 11/05/2013 8  3 - 12 % Final  . Monocytes Absolute 11/05/2013 0.7  0.1 - 1.0 K/uL Final  . Eosinophils Relative 11/05/2013 2  0 - 5 % Final  . Eosinophils Absolute 11/05/2013 0.2  0.0 - 0.7 K/uL Final  . Basophils Relative 11/05/2013 0  0 - 1 % Final  . Basophils Absolute 11/05/2013 0.0  0.0 - 0.1  K/uL Final  Infusion on 10/09/2013  Component Date Value Ref Range Status  . WBC 10/09/2013 5.8  4.0 - 10.5 K/uL Final  . RBC 10/09/2013 3.84* 3.87 - 5.11 MIL/uL Final  . Hemoglobin 10/09/2013 13.1  12.0 - 15.0 g/dL Final  . HCT 10/09/2013 37.3  36.0 - 46.0 % Final  . MCV 10/09/2013 97.1  78.0 - 100.0 fL Final  . MCH 10/09/2013 34.1* 26.0 - 34.0 pg Final  . MCHC 10/09/2013 35.1  30.0 - 36.0 g/dL Final  . RDW 10/09/2013 13.0  11.5 - 15.5 % Final  . Platelets 10/09/2013 225  150 - 400 K/uL Final  . Neutrophils Relative % 10/09/2013 67  43 - 77 % Final  . Neutro Abs 10/09/2013 3.9  1.7 - 7.7 K/uL Final  . Lymphocytes Relative 10/09/2013 16  12 - 46 % Final  . Lymphs Abs 10/09/2013 1.0  0.7 - 4.0 K/uL Final  . Monocytes Relative 10/09/2013 12  3 - 12 % Final  . Monocytes Absolute 10/09/2013 0.7  0.1 - 1.0 K/uL Final  . Eosinophils Relative 10/09/2013 5  0 - 5 % Final  . Eosinophils Absolute 10/09/2013 0.3  0.0 - 0.7 K/uL Final  . Basophils Relative 10/09/2013 0  0 - 1 % Final  .  Basophils Absolute 10/09/2013 0.0  0.0 - 0.1 K/uL Final    PATHOLOGY: No new pathology.  Urinalysis No results found for this basename: colorurine,  appearanceur,  labspec,  phurine,  glucoseu,  hgbur,  bilirubinur,  ketonesur,  proteinur,  urobilinogen,  nitrite,  leukocytesur    RADIOGRAPHIC STUDIES: No results found.  ASSESSMENT:  #1.Locally advanced right breast cancer, status post neoadjuvant treatment with TCHP x6 followed by right modified radical mastectomy and left simple mastectomy, currently receiving pertuzumab and trastuzumab intravenously every 3 weeks, cycle #12 today having completed radiotherapy 08/29/2013 and continuing on oral anastrozole 1 mg daily.  #2. Right upper extremity  lymphedema,, currently undergoing physical therapy, wearing a sleeve, improved.  #3. Joint discomfort secondary to anastrozole, controlled with Motrin total of 600 mg daily. #4. Minimal residual peripheral  neuropathy.    PLAN:  #1. Pertuzumab/trastuzumab cycle #12 today. #2. Continue anastrozole 1 mg daily. #3. Echocardiogram scheduled for this afternoon. #4. Followup in 2 months with CBC, chem profile, CA 27-29, and CEA. She was told to call should any new symptoms occur that are troublesome and persistent.   All questions were answered. The patient knows to call the clinic with any problems, questions or concerns. We can certainly see the patient much sooner if necessary.   I spent 25 minutes counseling the patient face to face. The total time spent in the appointment was 30 minutes.    Doroteo Bradford, MD 11/05/2013 9:44 AM

## 2013-11-05 NOTE — Progress Notes (Signed)
Labs drawn today for cbc/diff 

## 2013-12-17 ENCOUNTER — Encounter (HOSPITAL_COMMUNITY): Payer: Medicaid Other | Attending: Oncology

## 2013-12-17 DIAGNOSIS — C50919 Malignant neoplasm of unspecified site of unspecified female breast: Secondary | ICD-10-CM | POA: Insufficient documentation

## 2013-12-17 DIAGNOSIS — Z452 Encounter for adjustment and management of vascular access device: Secondary | ICD-10-CM

## 2013-12-17 DIAGNOSIS — T451X5A Adverse effect of antineoplastic and immunosuppressive drugs, initial encounter: Secondary | ICD-10-CM | POA: Insufficient documentation

## 2013-12-17 LAB — COMPREHENSIVE METABOLIC PANEL
ALK PHOS: 78 U/L (ref 39–117)
ALT: 11 U/L (ref 0–35)
AST: 14 U/L (ref 0–37)
Albumin: 3.9 g/dL (ref 3.5–5.2)
BUN: 25 mg/dL — ABNORMAL HIGH (ref 6–23)
CHLORIDE: 100 meq/L (ref 96–112)
CO2: 26 meq/L (ref 19–32)
Calcium: 9.5 mg/dL (ref 8.4–10.5)
Creatinine, Ser: 1.29 mg/dL — ABNORMAL HIGH (ref 0.50–1.10)
GFR calc Af Amer: 50 mL/min — ABNORMAL LOW (ref >90)
GFR, EST NON AFRICAN AMERICAN: 43 mL/min — AB (ref >90)
Glucose, Bld: 102 mg/dL — ABNORMAL HIGH (ref 70–99)
POTASSIUM: 3.9 meq/L (ref 3.7–5.3)
SODIUM: 140 meq/L (ref 137–147)
Total Bilirubin: 0.2 mg/dL — ABNORMAL LOW (ref 0.3–1.2)
Total Protein: 7.2 g/dL (ref 6.0–8.3)

## 2013-12-17 LAB — CBC WITH DIFFERENTIAL/PLATELET
BASOS ABS: 0 10*3/uL (ref 0.0–0.1)
Basophils Relative: 0 % (ref 0–1)
Eosinophils Absolute: 0.3 10*3/uL (ref 0.0–0.7)
Eosinophils Relative: 4 % (ref 0–5)
HCT: 35.2 % — ABNORMAL LOW (ref 36.0–46.0)
Hemoglobin: 12.3 g/dL (ref 12.0–15.0)
LYMPHS PCT: 18 % (ref 12–46)
Lymphs Abs: 1.2 10*3/uL (ref 0.7–4.0)
MCH: 33.5 pg (ref 26.0–34.0)
MCHC: 34.9 g/dL (ref 30.0–36.0)
MCV: 95.9 fL (ref 78.0–100.0)
Monocytes Absolute: 0.6 10*3/uL (ref 0.1–1.0)
Monocytes Relative: 10 % (ref 3–12)
NEUTROS ABS: 4.4 10*3/uL (ref 1.7–7.7)
NEUTROS PCT: 68 % (ref 43–77)
PLATELETS: 257 10*3/uL (ref 150–400)
RBC: 3.67 MIL/uL — ABNORMAL LOW (ref 3.87–5.11)
RDW: 12.6 % (ref 11.5–15.5)
WBC: 6.5 10*3/uL (ref 4.0–10.5)

## 2013-12-17 MED ORDER — HEPARIN SOD (PORK) LOCK FLUSH 100 UNIT/ML IV SOLN
INTRAVENOUS | Status: AC
  Filled 2013-12-17: qty 5

## 2013-12-17 MED ORDER — HEPARIN SOD (PORK) LOCK FLUSH 100 UNIT/ML IV SOLN
500.0000 [IU] | Freq: Once | INTRAVENOUS | Status: AC
  Administered 2013-12-17: 500 [IU] via INTRAVENOUS

## 2013-12-17 MED ORDER — SODIUM CHLORIDE 0.9 % IJ SOLN
10.0000 mL | INTRAMUSCULAR | Status: DC | PRN
  Administered 2013-12-17: 10 mL via INTRAVENOUS

## 2013-12-17 NOTE — Progress Notes (Signed)
Erika Nunez presented for Portacath access and flush. Proper placement of portacath confirmed by CXR. Portacath located rt chest wall accessed with  H 20 needle. Good blood return present. Portacath flushed with 42ml NS and 500U/59ml Heparin and needle removed intact. Procedure without incident. Patient tolerated procedure well.

## 2013-12-18 LAB — CEA: CEA: 0.8 ng/mL (ref 0.0–5.0)

## 2013-12-18 LAB — CANCER ANTIGEN 27.29: CA 27.29: 11 U/mL (ref 0–39)

## 2014-01-01 ENCOUNTER — Encounter (HOSPITAL_COMMUNITY): Payer: Self-pay

## 2014-01-01 ENCOUNTER — Encounter (HOSPITAL_BASED_OUTPATIENT_CLINIC_OR_DEPARTMENT_OTHER): Payer: Medicaid Other

## 2014-01-01 ENCOUNTER — Encounter (HOSPITAL_COMMUNITY): Payer: Medicaid Other

## 2014-01-01 VITALS — BP 119/76 | HR 83 | Temp 97.8°F | Resp 18 | Wt 197.4 lb

## 2014-01-01 DIAGNOSIS — G622 Polyneuropathy due to other toxic agents: Secondary | ICD-10-CM

## 2014-01-01 DIAGNOSIS — C773 Secondary and unspecified malignant neoplasm of axilla and upper limb lymph nodes: Secondary | ICD-10-CM

## 2014-01-01 DIAGNOSIS — I890 Lymphedema, not elsewhere classified: Secondary | ICD-10-CM

## 2014-01-01 DIAGNOSIS — C50919 Malignant neoplasm of unspecified site of unspecified female breast: Secondary | ICD-10-CM

## 2014-01-01 DIAGNOSIS — IMO0002 Reserved for concepts with insufficient information to code with codable children: Secondary | ICD-10-CM

## 2014-01-01 MED ORDER — GABAPENTIN 300 MG PO CAPS: ORAL_CAPSULE | ORAL | Status: DC

## 2014-01-01 NOTE — Progress Notes (Signed)
[place]  OFFICE PROGRESS NOTE  [name], MD [name] [place] [address]  DIAGNOSIS: Breast cancer  Peripheral neuropathy, secondary to drugs or chemicals  Chief Complaint  Patient presents with  . Breast Cancer    CURRENT THERAPY: Anastrozole 1 mg daily  INTERVAL HISTORY: Erika Nunez 63 y.o. female returns for followup of HER-2 positive breast cancer, having completed one year of treatment including pertuzumab and trastuzumab for the last 9 months preceded by carboplatin and Taxotere with the 2 antibodies for 6 cycles ending on 02/12/2013. She is currently taking anastrozole 1 mg daily.  Her primary complaint today is peripheral neuropathic discomfort as she bothers her during the night and interferes with her sleep. It apparently has been present since she received chemotherapy. She denies a worsening right upper extremity lymphedema. She also denies PND, orthopnea, or palpitations. Denies nausea, vomiting, diarrhea, constipation, incontinence, vaginal dryness or bleeding, or hot flashes. She does have occasional joint discomfort which is relieved by Motrin.  MEDICAL HISTORY: Past Medical History  Diagnosis Date  . Hypertension   . High cholesterol   . Cancer   . Breast cancer   . Allergy     seasonal allergies  . Arthritis   . Anxiety   . Breast mass, left 2014  . Breast mass, right 2014  . 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 Depression   . Lymphedema of arm     right arm    INTERIM HISTORY: has Breast cancer; Cellulitis of right upper extremity; Hypertension; Hypokalemia; Nausea with vomiting; Neutropenia; Anemia; Thrombocytopenia, unspecified; Antineoplastic chemotherapy induced pancytopenia; Lymphedema of upper extremity following lymphadenectomy; and Delayed postoperative wound closure on her problem list.    ALLERGIES:  is allergic to codeine.  MEDICATIONS: has a  current medication list which includes the following prescription(s): anastrozole, hydrocodone-acetaminophen, ibuprofen, lidocaine-prilocaine, lisinopril, lisinopril-hydrochlorothiazide, lorazepam, magnesium oxide, potassium chloride sa, pravastatin, ondansetron, and prochlorperazine.  SURGICAL HISTORY:  Past Surgical History  Procedure Laterality Date  . Cholecystectomy    . Tubal ligation    . Tonsillectomy    . Breast biopsy  10/02/2012    Procedure: BREAST BIOPSY;  Surgeon: Donato Heinz, MD;  Location: [facility];  Service: General;  Laterality: Right;  . Portacath placement Left 10/20/2012  . Breast biopsy Left 10/20/2012    Procedure: BREAST BIOPSY;  Surgeon: Donato Heinz, MD;  Location: [facility];  Service: General;  Laterality: Left;  Procedure end 1142  . Portacath placement Left 10/20/2012    Procedure: INSERTION PORT-A-CATH;  Surgeon: Donato Heinz, MD;  Location: [facility];  Service: General;  Laterality: Left;  Procedure began 9476; left subclavian  . Simple mastectomy with axillary sentinel node biopsy Bilateral 04/25/2013    Procedure: SIMPLE MASTECTOMY;  Surgeon: Donato Heinz, MD;  Location: [facility];  Service: General;  Laterality: Bilateral;    FAMILY HISTORY: family history includes COPD in her daughter; Cancer in her maternal aunt, maternal aunt, and maternal grandmother; Heart attack in her father; Stroke in her maternal uncle.  SOCIAL HISTORY:  reports that she has never smoked. She has never used smokeless tobacco. She reports that she drinks about .6 ounces of alcohol per week. She reports that she does not use illicit drugs.  REVIEW OF SYSTEMS:  Other than that discussed above is noncontributory.  PHYSICAL EXAMINATION: ECOG PERFORMANCE STATUS: 1 - Symptomatic but completely ambulatory  Blood pressure 119/76, pulse  83, temperature 97.8 F (36.6 C), temperature source Oral, resp. rate 18, weight 197 lb 6.4 oz (89.54 kg).  GENERAL:alert, no distress and comfortable.  Alopecia resolved. SKIN: skin color, texture, turgor are normal, no rashes or significant lesions EYES: PERLA; Conjunctiva are pink and non-injected, sclera clear SINUSES: No redness or tenderness over maxillary or ethmoid sinuses OROPHARYNX:no exudate, no erythema on lips, buccal mucosa, or tongue. NECK: supple, thyroid normal size, non-tender, without nodularity. No masses CHEST: Status post bilateral mastectomies with hyperpigmentation changes of radiation, no evidence of subcutaneous nodules. LYMPH:  no palpable lymphadenopathy in the cervical, axillary or inguinal LUNGS: clear to auscultation and percussion with normal breathing effort HEART: regular rate & rhythm and no murmurs. ABDOMEN:abdomen soft, non-tender and normal bowel sounds MUSCULOSKELETAL:no cyanosis of digits and no clubbing. Range of motion normal.  NEURO: alert & oriented x 3 with fluent speech, no focal motor/sensory deficits   LABORATORY DATA: Infusion on 12/17/2013  Component Date Value Ref Range Status  . WBC 12/17/2013 6.5  4.0 - 10.5 K/uL Final  . RBC 12/17/2013 3.67* 3.87 - 5.11 MIL/uL Final  . Hemoglobin 12/17/2013 12.3  12.0 - 15.0 g/dL Final  . HCT 12/17/2013 35.2* 36.0 - 46.0 % Final  . MCV 12/17/2013 95.9  78.0 - 100.0 fL Final  . MCH 12/17/2013 33.5  26.0 - 34.0 pg Final  . MCHC 12/17/2013 34.9  30.0 - 36.0 g/dL Final  . RDW 12/17/2013 12.6  11.5 - 15.5 % Final  . Platelets 12/17/2013 257  150 - 400 K/uL Final  . Neutrophils Relative % 12/17/2013 68  43 - 77 % Final  . Neutro Abs 12/17/2013 4.4  1.7 - 7.7 K/uL Final  . Lymphocytes Relative 12/17/2013 18  12 - 46 % Final  . Lymphs Abs 12/17/2013 1.2  0.7 - 4.0 K/uL Final  . Monocytes Relative 12/17/2013 10  3 - 12 % Final  . Monocytes Absolute 12/17/2013 0.6  0.1 - 1.0 K/uL Final  . Eosinophils Relative 12/17/2013 4  0 - 5 % Final  . Eosinophils Absolute 12/17/2013 0.3  0.0 - 0.7 K/uL Final  . Basophils Relative 12/17/2013 0  0 - 1 % Final  .  Basophils Absolute 12/17/2013 0.0  0.0 - 0.1 K/uL Final  . Sodium 12/17/2013 140  137 - 147 mEq/L Final  . Potassium 12/17/2013 3.9  3.7 - 5.3 mEq/L Final  . Chloride 12/17/2013 100  96 - 112 mEq/L Final  . CO2 12/17/2013 26  19 - 32 mEq/L Final  . Glucose, Bld 12/17/2013 102* 70 - 99 mg/dL Final  . BUN 12/17/2013 25* 6 - 23 mg/dL Final  . Creatinine, Ser 12/17/2013 1.29* 0.50 - 1.10 mg/dL Final  . Calcium 12/17/2013 9.5  8.4 - 10.5 mg/dL Final  . Total Protein 12/17/2013 7.2  6.0 - 8.3 g/dL Final  . Albumin 12/17/2013 3.9  3.5 - 5.2 g/dL Final  . AST 12/17/2013 14  0 - 37 U/L Final  . ALT 12/17/2013 11  0 - 35 U/L Final  . Alkaline Phosphatase 12/17/2013 78  39 - 117 U/L Final  . Total Bilirubin 12/17/2013 0.2* 0.3 - 1.2 mg/dL Final  . GFR calc non Af Amer 12/17/2013 43* >90 mL/min Final  . GFR calc Af Amer 12/17/2013 50* >90 mL/min Final   Comment: (NOTE)                          The eGFR has been calculated  using the CKD EPI equation.                          This calculation has not been validated in all clinical situations.                          eGFR's persistently <90 mL/min signify possible Chronic Kidney                          Disease.  . CEA 12/17/2013 0.8  0.0 - 5.0 ng/mL Final   Performed at [facility]  . CA 27.29 12/17/2013 11  0 - 39 U/mL Final   Performed at [facility]: No new pathology.  Urinalysis No results found for this basename: colorurine,  appearanceur,  labspec,  phurine,  glucoseu,  hgbur,  bilirubinur,  ketonesur,  proteinur,  urobilinogen,  nitrite,  leukocytesur    RADIOGRAPHIC STUDIES:    2D Echocardiogram without contrast     Ordering Physician: Farrel Gobble, MD  Order# [id number] Study Date: 11/05/13      Patient Information     Name MRN  Description    Erika Nunez [id number]  63 year old Female             Result Narrative    *[address] [place], [place]  [address] [phone]  ------------------------------------------------------------ Transthoracic Echocardiography  Patient: Shebra, [place] MR #: [id number] Study Date: 11/05/2013 Gender: F Age: 72 Height: 172.7cm Weight: 86.6kg BSA: 75m2 Pt. Status: Room:  SONOGRAPHER CLina Sar RDCS ATTENDING FDannial Monarch GBelenda CruiseREFERRING FFarrel GobblePERFORMING CRadene KneePenn cc:  ------------------------------------------------------------ LV EF: 60% - 65%  ------------------------------------------------------------ Indications: Neoplasm - breast 174.9.  ------------------------------------------------------------ History: Risk factors: Breast cancer currently on chemotherapy Hypertension. Dyslipidemia.  ------------------------------------------------------------ Study Conclusions  - Procedure narrative: Transthoracic echocardiography. Image quality was suboptimal. The study was technically difficult, as a result of poor sound wave transmission. - Left ventricle: Mild to moderate concentric hypertrophy. The cavity size was normal. Systolic function was normal. The estimated ejection fraction was in the range of 60% to 65%. Wall motion was normal; there were no regional wall motion abnormalities. There was an increased relative contribution of atrial contraction to ventricular filling. Doppler parameters are consistent with abnormal left ventricular relaxation (grade 1 diastolic dysfunction). - Aortic valve: Mildly increased transvalvular velocitiies. Mildly thickened leaflets. Peak velocity: 213cm/s (S). Mean gradient: 175mHg (S). Valve area: 2.28cm^2(VTI). - Mitral valve: Calcified annulus. Mildly thickened leaflets . Trivial regurgitation. - Left atrium: The atrium was mildly dilated. Transthoracic echocardiography. M-mode, complete 2D, spectral Doppler, and color Doppler. Height: Height: 172.7cm. Height: 68in. Weight: Weight: 86.6kg.  Weight: 190.6lb. Body mass index: BMI: 29kg/m^2. Body surface area: BSA: 73m70m Blood pressure: 122/71. Patient status: Outpatient. Location: Echo laboratory.  ------------------------------------------------------------  ------------------------------------------------------------ Left ventricle: Mild to moderate concentric hypertrophy. The cavity size was normal. Systolic function was normal. The estimated ejection fraction was in the range of 60% to 65%. Wall motion was normal; there were no regional wall motion abnormalities. There was an increased relative contribution of atrial contraction to ventricular filling. Doppler parameters are consistent with abnormal left ventricular relaxation (grade 1 diastolic dysfunction). There was no evidence of elevated ventricular filling pressure by Doppler parameters.  ------------------------------------------------------------ Aortic valve: Mildly increased transvalvular velocitiies. Poorly visualized. Mildly thickened leaflets. Doppler: No regurgitation. VTI  ratio of LVOT to aortic valve: 0.72. Valve area: 2.28cm^2(VTI). Indexed valve area: 1.14cm^2/m^2 (VTI). Peak velocity ratio of LVOT to aortic valve: 0.73. Valve area: 2.3cm^2 (Vmax). Indexed valve area: 1.15cm^2/m^2 (Vmax). Mean gradient: 8m Hg (S). Peak gradient: 119mHg (S).  ------------------------------------------------------------ Aorta: Aortic root: The aortic root was normal in size.  ------------------------------------------------------------ Mitral valve: Calcified annulus. Mildly thickened leaflets . Doppler: Trivial regurgitation.  ------------------------------------------------------------ Left atrium: The atrium was mildly dilated.  ------------------------------------------------------------ Atrial septum: No defect or patent foramen ovale was identified.  ------------------------------------------------------------ Right ventricle: The cavity size was  normal. Wall thickness was normal. Systolic function was normal.  ------------------------------------------------------------ Pulmonic valve: Poorly visualized. Doppler: No significant regurgitation.  ------------------------------------------------------------ Tricuspid valve: Structurally normal valve. Leaflet separation was normal. Doppler: Transvalvular velocity was within the normal range. Trivial regurgitation.  ------------------------------------------------------------ Right atrium: The atrium was normal in size.  ------------------------------------------------------------ Pericardium: There was no pericardial effusion.  ------------------------------------------------------------ Systemic veins: Inferior vena cava: The vessel was normal in size; the respirophasic diameter changes were in the normal range (= 50%); findings are consistent with normal central venous pressure.  ------------------------------------------------------------  2D measurements Normal Doppler measurements Normal Left ventricle Main pulmonary LVID ED, 39.8 mm 43-52 artery chord, Pressure, 32 mm Hg =30 PLAX S LVID ES, 24.9 mm 23-38 Left ventricle chord, Ea, lat 8.9 cm/s ------ PLAX ann, tiss FS, chord, 37 % >29 DP PLAX E/Ea, lat 7.82 ------ LVPW, ED 13.6 mm ------ ann, tiss IVS/LVPW 0.91 <1.3 DP ratio, ED Ea, med 7 cm/s ------ Vol ED, 73 ml ------ ann, tiss MOD1 DP Vol ES, 32 ml ------ E/Ea, med 9.94 ------ MOD1 ann, tiss EF, MOD1 56 % ------ DP Vol index, 37 ml/m^2 ------ LVOT ED, MOD1 Peak vel, 156 cm/s ------ Vol index, 16 ml/m^2 ------ S ES, MOD1 VTI, S 30.3 cm ------ Vol ED, 66 ml ------ Peak 10 mm Hg ------ MOD2 gradient, Vol ES, 26 ml ------ S MOD2 Aortic valve EF, MOD2 61 % ------ Peak vel, 213 cm/s ------ Stroke 40 ml ------ S vol, MOD2 Mean vel, 140 cm/s ------ Vol index, 33 ml/m^2 ------ S ED, MOD2 VTI, S 41.8 cm ------ Vol index, 13 ml/m^2 ------ Mean 10 mm Hg  ------ ES, MOD2 gradient, Stroke 20 ml/m^2 ------ S index, Peak 18 mm Hg ------ MOD2 gradient, Ventricular septum S IVS, ED 12.4 mm ------ VTI ratio 0.72 ------ LVOT LVOT/AV Diam, S 20 mm ------ Area, VTI 2.28 cm^2 ------ Area 3.14 cm^2 ------ Area index 1.14 cm^2/m ------ Aorta (VTI) ^2 Root diam, 29 mm ------ Peak vel 0.73 ------ ED ratio, Left atrium LVOT/AV AP dim 42 mm ------ Area, Vmax 2.3 cm^2 ------ AP dim 2.1 cm/m^2 <2.2 Area index 1.15 cm^2/m ------ index (Vmax) ^2 Mitral valve Peak E vel 69.6 cm/s ------ Peak A vel 92.8 cm/s ------ Decelerati 285 ms 150-23 on time 0 Peak E/A 0.8 ------ ratio Tricuspid valve Regurg 269 cm/s ------ peak vel Peak RV-RA 29 mm Hg ------ gradient, S Systemic veins Estimated 3 mm Hg ------ CVP Right ventricle Pressure, 32 mm Hg <30 S Sa vel, 12.9 cm/s ------ lat ann, tiss DP  ------------------------------------------------------------ Prepared and Electronically Authenticated by  SuKate SableMD 2015-02-23T18:40:     ASSESSMENT:  #1.Locally advanced right breast cancer, status post neoadjuvant treatment with TCHP x6 followed by right modified radical mastectomy and left simple mastectomy, having completed 12 cycles of  pertuzumab and trastuzumab intravenously every 3 weeks on 11/05/2013. having completed radiotherapy 08/29/2013 and continuing on oral anastrozole  1 mg daily.  #2. Right upper extremity lymphedema,, currently undergoing physical therapy, wearing a sleeve, improved.  #3. Joint discomfort secondary to anastrozole, controlled with Motrin total of 600 mg daily.  #4. Residual peripheral neuropathy, more symptomatic.    PLAN:  #1. Gabapentin 300 mg twice a day. Patient was told to call back in 7-10 days regarding relief of symptoms so that appropriate titration could be implemented. #2. Continue anastrozole 1 mg daily. #3. Followup in 3 months with CBC, chem profile, CEA, and CA 27-29. Port flush every 6  weeks.   All questions were answered. The patient knows to call the clinic with any problems, questions or concerns. We can certainly see the patient much sooner if necessary.   I spent 25 minutes counseling the patient face to face. The total time spent in the appointment was 30 minutes.    Farrel Gobble, MD 01/01/2014 10:54 AM  DISCLAIMER:  This note was dictated with voice recognition software.  Similar sounding words can inadvertently be transcribed inaccurately and may not be corrected upon review.

## 2014-01-01 NOTE — Patient Instructions (Addendum)
[facility] Discharge Instructions  RECOMMENDATIONS MADE BY THE CONSULTANT AND ANY TEST RESULTS WILL BE SENT TO YOUR REFERRING PHYSICIAN.  EXAM FINDINGS BY THE PHYSICIAN TODAY AND SIGNS OR SYMPTOMS TO REPORT TO CLINIC OR PRIMARY PHYSICIAN: Exam and findings as discussed by Dr. Barnet Glasgow.  Will put you on something for the discomfort that you are having in your hands and feet.  Call Erika Nunez in 2 weeks to let Erika Nunez know how you are doing with the medication. Erika Kay, RN  [phone]). Report any new lumps, bone pain, shortness of breath or other symptoms.  MEDICATIONS PRESCRIBED:  Erika Nunez 300 mg twice daily  INSTRUCTIONS/FOLLOW-UP: Follow-up in 3 months with labs and office visit.  Thank you for choosing [facility] to provide your oncology and hematology care.  To afford each patient quality time with our providers, please arrive at least 15 minutes before your scheduled appointment time.  With your help, our goal is to use those 15 minutes to complete the necessary work-up to ensure our physicians have the information they need to help with your evaluation and healthcare recommendations.    Effective January 1st, 2014, we ask that you re-schedule your appointment with our physicians should you arrive 10 or more minutes late for your appointment.  We strive to give you quality time with our providers, and arriving late affects you and other patients whose appointments are after yours.    Again, thank you for choosing [facility].  Our hope is that these requests will decrease the amount of time that you wait before being seen by our physicians.       _____________________________________________________________  Should you have questions after your visit to [facility], please contact our office at [phone] between the hours of 8:30 a.m. and 5:00 p.m.  Voicemails left after 4:30 p.m. will not be returned until the following  business day.  For prescription refill requests, have your pharmacy contact our office with your prescription refill request.     Erika Nunez capsules or tablets What is this medicine? Erika Nunez (GA ba pen tin) is used to control partial seizures in adults with epilepsy. It is also used to treat certain types of nerve pain. This medicine may be used for other purposes; ask your health care provider or pharmacist if you have questions. COMMON BRAND NAME(S): Orpha Bur , Neurontin What should I tell my health care provider before I take this medicine? They need to know if you have any of these conditions: -kidney disease -suicidal thoughts, plans, or attempt; a previous suicide attempt by you or a family member -an unusual or allergic reaction to Erika Nunez, other medicines, foods, dyes, or preservatives -pregnant or trying to get pregnant -breast-feeding How should I use this medicine? Take this medicine by mouth with a glass of water. Follow the directions on the prescription label. You can take it with or without food. If it upsets your stomach, take it with food.Take your medicine at regular intervals. Do not take it more often than directed. Do not stop taking except on your doctor's advice. If you are directed to break the 600 or 800 mg tablets in half as part of your dose, the extra half tablet should be used for the next dose. If you have not used the extra half tablet within 28 days, it should be thrown away. A special MedGuide will be given to you by the pharmacist with each prescription and refill. Be sure to read  this information carefully each time. Talk to your pediatrician regarding the use of this medicine in children. Special care may be needed. Overdosage: If you think you have taken too much of this medicine contact a poison control center or emergency room at once. NOTE: This medicine is only for you. Do not share this medicine with others. What if I miss a dose? If you miss a  dose, take it as soon as you can. If it is almost time for your next dose, take only that dose. Do not take double or extra doses. What may interact with this medicine? Do not take this medicine with any of the following medications: -other Erika Nunez products This medicine may also interact with the following medications: -alcohol -antacids -antihistamines for allergy, cough and cold -certain medicines for anxiety or sleep -certain medicines for depression or psychotic disturbances -homatropine; hydrocodone -naproxen -narcotic medicines (opiates) for pain -phenothiazines like chlorpromazine, mesoridazine, prochlorperazine, thioridazine This list may not describe all possible interactions. Give your health care provider a list of all the medicines, herbs, non-prescription drugs, or dietary supplements you use. Also tell them if you smoke, drink alcohol, or use illegal drugs. Some items may interact with your medicine. What should I watch for while using this medicine? Visit your doctor or health care professional for regular checks on your progress. You may want to keep a record at home of how you feel your condition is responding to treatment. You may want to share this information with your doctor or health care professional at each visit. You should contact your doctor or health care professional if your seizures get worse or if you have any new types of seizures. Do not stop taking this medicine or any of your seizure medicines unless instructed by your doctor or health care professional. Stopping your medicine suddenly can increase your seizures or their severity. Wear a medical identification bracelet or chain if you are taking this medicine for seizures, and carry a card that lists all your medications. You may get drowsy, dizzy, or have blurred vision. Do not drive, use machinery, or do anything that needs mental alertness until you know how this medicine affects you. To reduce dizzy or  fainting spells, do not sit or stand up quickly, especially if you are an older patient. Alcohol can increase drowsiness and dizziness. Avoid alcoholic drinks. Your mouth may get dry. Chewing sugarless gum or sucking hard candy, and drinking plenty of water will help. The use of this medicine may increase the chance of suicidal thoughts or actions. Pay special attention to how you are responding while on this medicine. Any worsening of mood, or thoughts of suicide or dying should be reported to your health care professional right away. Women who become pregnant while using this medicine may enroll in the [place] Pregnancy Registry by calling [phone]. This registry collects information about the safety of antiepileptic drug use during pregnancy. What side effects may I notice from receiving this medicine? Side effects that you should report to your doctor or health care professional as soon as possible: -allergic reactions like skin rash, itching or hives, swelling of the face, lips, or tongue -worsening of mood, thoughts or actions of suicide or dying Side effects that usually do not require medical attention (report to your doctor or health care professional if they continue or are bothersome): -constipation -difficulty walking or controlling muscle movements -dizziness -nausea -slurred speech -tiredness -tremors -weight gain This list may not describe all possible side  effects. Call your doctor for medical advice about side effects. You may report side effects to FDA at 1-800-FDA-1088. Where should I keep my medicine? Keep out of reach of children. Store at room temperature between 15 and 30 degrees C (59 and 86 degrees F). Throw away any unused medicine after the expiration date. NOTE: This sheet is a summary. It may not cover all possible information. If you have questions about this medicine, talk to your doctor, pharmacist, or health care provider.  2014,  Elsevier/Gold Standard. (2013-05-03 09:12:48)

## 2014-01-01 NOTE — Addendum Note (Signed)
Addended by: Mellissa Kohut on: 01/01/2014 11:07 AM   Modules accepted: Orders

## 2014-01-15 ENCOUNTER — Telehealth (HOSPITAL_COMMUNITY): Payer: Self-pay

## 2014-01-15 NOTE — Telephone Encounter (Signed)
Call from patient.  States "the new medication (gabapentin) is doing real well.  Hands and feet feel much better. Have very little discomfort."

## 2014-01-28 ENCOUNTER — Encounter (HOSPITAL_COMMUNITY): Payer: Medicaid Other | Attending: Oncology

## 2014-01-28 DIAGNOSIS — C773 Secondary and unspecified malignant neoplasm of axilla and upper limb lymph nodes: Secondary | ICD-10-CM

## 2014-01-28 DIAGNOSIS — Z95828 Presence of other vascular implants and grafts: Secondary | ICD-10-CM

## 2014-01-28 DIAGNOSIS — Z452 Encounter for adjustment and management of vascular access device: Secondary | ICD-10-CM

## 2014-01-28 DIAGNOSIS — T451X5A Adverse effect of antineoplastic and immunosuppressive drugs, initial encounter: Secondary | ICD-10-CM | POA: Insufficient documentation

## 2014-01-28 DIAGNOSIS — C50919 Malignant neoplasm of unspecified site of unspecified female breast: Secondary | ICD-10-CM

## 2014-01-28 DIAGNOSIS — Z9889 Other specified postprocedural states: Secondary | ICD-10-CM | POA: Insufficient documentation

## 2014-01-28 MED ORDER — SODIUM CHLORIDE 0.9 % IJ SOLN
10.0000 mL | INTRAMUSCULAR | Status: DC | PRN
  Administered 2014-01-28: 10 mL via INTRAVENOUS

## 2014-01-28 MED ORDER — HEPARIN SOD (PORK) LOCK FLUSH 100 UNIT/ML IV SOLN
INTRAVENOUS | Status: AC
  Filled 2014-01-28: qty 5

## 2014-01-28 MED ORDER — HEPARIN SOD (PORK) LOCK FLUSH 100 UNIT/ML IV SOLN
500.0000 [IU] | Freq: Once | INTRAVENOUS | Status: AC
  Administered 2014-01-28: 500 [IU] via INTRAVENOUS

## 2014-01-28 NOTE — Progress Notes (Signed)
Erika Nunez presented for Portacath access and flush. Proper placement of portacath confirmed by CXR. Portacath located left chest wall accessed with  H 20 needle. Good blood return present. Portacath flushed with 11ml NS and 500U/16ml Heparin and needle removed intact. Procedure without incident. Patient tolerated procedure well.

## 2014-02-12 ENCOUNTER — Other Ambulatory Visit (HOSPITAL_COMMUNITY): Payer: Self-pay | Admitting: Oncology

## 2014-02-12 DIAGNOSIS — C50919 Malignant neoplasm of unspecified site of unspecified female breast: Secondary | ICD-10-CM

## 2014-02-12 MED ORDER — LORAZEPAM 1 MG PO TABS: 1.0000 mg | ORAL_TABLET | ORAL | Status: DC | PRN

## 2014-02-14 ENCOUNTER — Telehealth (HOSPITAL_COMMUNITY): Payer: Self-pay

## 2014-02-14 NOTE — Telephone Encounter (Signed)
Message copied by Mellissa Kohut on Thu Feb 14, 2014  3:44 PM ------      Message from: [place], E. Lopez: Thu Feb 14, 2014  2:01 PM       Probably not the gabapentin.  Ask about allergies this time of year and if answer is yes, Flonase 2 puffs in each nostril at bedime after Netti-pot along with Loratidine (Claritin) 10mg  daily.  If no allergies, make office appointment.  Thanks. Dr.F ------

## 2014-02-14 NOTE — Telephone Encounter (Signed)
Spoke with daughter and patient is experiencing some allergy symptoms.  Instruction given as listed below and verbalized understanding of instructions.  To call back if symptoms do not improve.

## 2014-02-14 NOTE — Telephone Encounter (Signed)
Call from daughter stating that her mother was having episodes of dizziness for about 1 1/2 weeks and that her mother thinks it's the gabapentin that is causing this. Wants to know what to do.

## 2014-02-14 NOTE — Telephone Encounter (Signed)
Left message for patient to call back  

## 2014-02-20 ENCOUNTER — Other Ambulatory Visit: Payer: Self-pay

## 2014-02-20 ENCOUNTER — Emergency Department (HOSPITAL_COMMUNITY): Payer: Medicaid Other

## 2014-02-20 ENCOUNTER — Inpatient Hospital Stay (HOSPITAL_COMMUNITY)
Admission: EM | Admit: 2014-02-20 | Discharge: 2014-03-01 | DRG: 025 | Disposition: A | Discharge status: Home / Self Care | Payer: Medicaid Other | Attending: Neurosurgery | Admitting: Neurosurgery

## 2014-02-20 ENCOUNTER — Encounter (HOSPITAL_COMMUNITY): Payer: Self-pay | Admitting: Emergency Medicine

## 2014-02-20 DIAGNOSIS — Z6830 Body mass index (BMI) 30.0-30.9, adult: Secondary | ICD-10-CM

## 2014-02-20 DIAGNOSIS — G911 Obstructive hydrocephalus: Secondary | ICD-10-CM | POA: Diagnosis present

## 2014-02-20 DIAGNOSIS — Z22322 Carrier or suspected carrier of Methicillin resistant Staphylococcus aureus: Secondary | ICD-10-CM

## 2014-02-20 DIAGNOSIS — Z801 Family history of malignant neoplasm of trachea, bronchus and lung: Secondary | ICD-10-CM

## 2014-02-20 DIAGNOSIS — N179 Acute kidney failure, unspecified: Secondary | ICD-10-CM | POA: Diagnosis not present

## 2014-02-20 DIAGNOSIS — I10 Essential (primary) hypertension: Secondary | ICD-10-CM | POA: Diagnosis present

## 2014-02-20 DIAGNOSIS — C50919 Malignant neoplasm of unspecified site of unspecified female breast: Secondary | ICD-10-CM | POA: Diagnosis present

## 2014-02-20 DIAGNOSIS — Z800 Family history of malignant neoplasm of digestive organs: Secondary | ICD-10-CM | POA: Diagnosis not present

## 2014-02-20 DIAGNOSIS — G936 Cerebral edema: Secondary | ICD-10-CM | POA: Diagnosis present

## 2014-02-20 DIAGNOSIS — M129 Arthropathy, unspecified: Secondary | ICD-10-CM | POA: Diagnosis present

## 2014-02-20 DIAGNOSIS — G9389 Other specified disorders of brain: Secondary | ICD-10-CM | POA: Diagnosis present

## 2014-02-20 DIAGNOSIS — H547 Unspecified visual loss: Secondary | ICD-10-CM | POA: Diagnosis present

## 2014-02-20 DIAGNOSIS — F411 Generalized anxiety disorder: Secondary | ICD-10-CM | POA: Diagnosis present

## 2014-02-20 DIAGNOSIS — R519 Headache, unspecified: Secondary | ICD-10-CM | POA: Diagnosis present

## 2014-02-20 DIAGNOSIS — C7949 Secondary malignant neoplasm of other parts of nervous system: Secondary | ICD-10-CM | POA: Diagnosis not present

## 2014-02-20 DIAGNOSIS — R279 Unspecified lack of coordination: Secondary | ICD-10-CM | POA: Diagnosis present

## 2014-02-20 DIAGNOSIS — I890 Lymphedema, not elsewhere classified: Secondary | ICD-10-CM | POA: Diagnosis present

## 2014-02-20 DIAGNOSIS — G939 Disorder of brain, unspecified: Secondary | ICD-10-CM

## 2014-02-20 DIAGNOSIS — C7931 Secondary malignant neoplasm of brain: Secondary | ICD-10-CM | POA: Diagnosis present

## 2014-02-20 DIAGNOSIS — Z923 Personal history of irradiation: Secondary | ICD-10-CM | POA: Diagnosis not present

## 2014-02-20 DIAGNOSIS — I639 Cerebral infarction, unspecified: Secondary | ICD-10-CM

## 2014-02-20 DIAGNOSIS — D649 Anemia, unspecified: Secondary | ICD-10-CM

## 2014-02-20 DIAGNOSIS — R51 Headache: Secondary | ICD-10-CM | POA: Diagnosis present

## 2014-02-20 DIAGNOSIS — G609 Hereditary and idiopathic neuropathy, unspecified: Secondary | ICD-10-CM | POA: Diagnosis present

## 2014-02-20 LAB — CBC
HCT: 39.8 % (ref 36.0–46.0)
Hemoglobin: 13.9 g/dL (ref 12.0–15.0)
MCH: 33.1 pg (ref 26.0–34.0)
MCHC: 34.9 g/dL (ref 30.0–36.0)
MCV: 94.8 fL (ref 78.0–100.0)
PLATELETS: 244 10*3/uL (ref 150–400)
RBC: 4.2 MIL/uL (ref 3.87–5.11)
RDW: 12.9 % (ref 11.5–15.5)
WBC: 10.3 10*3/uL (ref 4.0–10.5)

## 2014-02-20 LAB — DIFFERENTIAL
BASOS ABS: 0 10*3/uL (ref 0.0–0.1)
BASOS PCT: 0 % (ref 0–1)
EOS ABS: 0.2 10*3/uL (ref 0.0–0.7)
EOS PCT: 2 % (ref 0–5)
Lymphocytes Relative: 15 % (ref 12–46)
Lymphs Abs: 1.6 10*3/uL (ref 0.7–4.0)
MONO ABS: 0.9 10*3/uL (ref 0.1–1.0)
Monocytes Relative: 9 % (ref 3–12)
Neutro Abs: 7.6 10*3/uL (ref 1.7–7.7)
Neutrophils Relative %: 74 % (ref 43–77)

## 2014-02-20 LAB — COMPREHENSIVE METABOLIC PANEL
ALBUMIN: 4.2 g/dL (ref 3.5–5.2)
ALT: 18 U/L (ref 0–35)
AST: 17 U/L (ref 0–37)
Alkaline Phosphatase: 80 U/L (ref 39–117)
BUN: 24 mg/dL — ABNORMAL HIGH (ref 6–23)
CALCIUM: 10 mg/dL (ref 8.4–10.5)
CO2: 22 meq/L (ref 19–32)
Chloride: 99 mEq/L (ref 96–112)
Creatinine, Ser: 1.26 mg/dL — ABNORMAL HIGH (ref 0.50–1.10)
GFR calc Af Amer: 51 mL/min — ABNORMAL LOW (ref >90)
GFR, EST NON AFRICAN AMERICAN: 44 mL/min — AB (ref >90)
Glucose, Bld: 126 mg/dL — ABNORMAL HIGH (ref 70–99)
Potassium: 4 mEq/L (ref 3.7–5.3)
SODIUM: 140 meq/L (ref 137–147)
Total Bilirubin: 0.2 mg/dL — ABNORMAL LOW (ref 0.3–1.2)
Total Protein: 7.3 g/dL (ref 6.0–8.3)

## 2014-02-20 LAB — PROTIME-INR
INR: 0.92 (ref 0.00–1.49)
PROTHROMBIN TIME: 12.2 s (ref 11.6–15.2)

## 2014-02-20 LAB — APTT: aPTT: 32 seconds (ref 24–37)

## 2014-02-20 LAB — ETHANOL

## 2014-02-20 MED ORDER — LISINOPRIL 20 MG PO TABS
20.0000 mg | ORAL_TABLET | Freq: Every day | ORAL | Status: DC
  Filled 2014-02-20 (×2): qty 1

## 2014-02-20 MED ORDER — LISINOPRIL-HYDROCHLOROTHIAZIDE 20-25 MG PO TABS: 1.0000 | ORAL_TABLET | Freq: Every day | ORAL | Status: DC

## 2014-02-20 MED ORDER — SIMVASTATIN 40 MG PO TABS
40.0000 mg | ORAL_TABLET | Freq: Every day | ORAL | Status: DC
  Administered 2014-02-21 – 2014-02-28 (×8): 40 mg via ORAL
  Filled 2014-02-20 (×11): qty 1

## 2014-02-20 MED ORDER — MAGNESIUM OXIDE 400 MG PO TABS
400.0000 mg | ORAL_TABLET | Freq: Three times a day (TID) | ORAL | Status: DC
  Administered 2014-02-21 – 2014-03-01 (×24): 400 mg via ORAL
  Filled 2014-02-20 (×30): qty 1

## 2014-02-20 MED ORDER — ONDANSETRON HCL 4 MG/2ML IJ SOLN
4.0000 mg | Freq: Four times a day (QID) | INTRAMUSCULAR | Status: DC | PRN
  Administered 2014-02-20: 4 mg via INTRAVENOUS
  Filled 2014-02-20: qty 2

## 2014-02-20 MED ORDER — DEXAMETHASONE SODIUM PHOSPHATE 4 MG/ML IJ SOLN
4.0000 mg | Freq: Four times a day (QID) | INTRAMUSCULAR | Status: DC
  Administered 2014-02-21 – 2014-03-01 (×33): 4 mg via INTRAVENOUS
  Filled 2014-02-20 (×44): qty 1

## 2014-02-20 MED ORDER — DEXAMETHASONE SODIUM PHOSPHATE 10 MG/ML IJ SOLN
10.0000 mg | Freq: Once | INTRAMUSCULAR | Status: AC
Start: 2014-02-20 — End: 2014-02-20
  Administered 2014-02-20: 10 mg via INTRAVENOUS
  Filled 2014-02-20: qty 1

## 2014-02-20 MED ORDER — GABAPENTIN 300 MG PO CAPS
300.0000 mg | ORAL_CAPSULE | Freq: Two times a day (BID) | ORAL | Status: DC
  Administered 2014-02-21 – 2014-03-01 (×16): 300 mg via ORAL
  Filled 2014-02-20 (×20): qty 1

## 2014-02-20 MED ORDER — POTASSIUM CHLORIDE CRYS ER 20 MEQ PO TBCR
40.0000 meq | EXTENDED_RELEASE_TABLET | Freq: Three times a day (TID) | ORAL | Status: DC
  Administered 2014-02-21 – 2014-02-23 (×7): 40 meq via ORAL
  Filled 2014-02-20 (×9): qty 2

## 2014-02-20 MED ORDER — HYDROCHLOROTHIAZIDE 25 MG PO TABS
25.0000 mg | ORAL_TABLET | Freq: Every day | ORAL | Status: DC
  Administered 2014-02-21 – 2014-02-22 (×2): 25 mg via ORAL
  Filled 2014-02-20 (×2): qty 1

## 2014-02-20 MED ORDER — ANASTROZOLE 1 MG PO TABS
1.0000 mg | ORAL_TABLET | Freq: Every day | ORAL | Status: DC
  Administered 2014-02-21 – 2014-03-01 (×8): 1 mg via ORAL
  Filled 2014-02-20 (×9): qty 1

## 2014-02-20 MED ORDER — LORAZEPAM 1 MG PO TABS: 1.0000 mg | ORAL_TABLET | ORAL | Status: DC | PRN

## 2014-02-20 MED ORDER — GADOBENATE DIMEGLUMINE 529 MG/ML IV SOLN
20.0000 mL | Freq: Once | INTRAVENOUS | Status: AC
  Administered 2014-02-20: 19 mL via INTRAVENOUS

## 2014-02-20 MED ORDER — SODIUM CHLORIDE 0.9 % IV SOLN: 10.0000 mg | Freq: Once | INTRAVENOUS | Status: DC

## 2014-02-20 MED ORDER — LISINOPRIL 20 MG PO TABS
20.0000 mg | ORAL_TABLET | Freq: Every day | ORAL | Status: DC
  Administered 2014-02-21 – 2014-02-22 (×2): 20 mg via ORAL
  Filled 2014-02-20 (×2): qty 1

## 2014-02-20 MED ORDER — SODIUM CHLORIDE 0.9 % IJ SOLN
3.0000 mL | Freq: Two times a day (BID) | INTRAMUSCULAR | Status: DC
  Administered 2014-02-21 – 2014-02-26 (×6): 3 mL via INTRAVENOUS

## 2014-02-20 MED ORDER — IBUPROFEN 200 MG PO TABS
200.0000 mg | ORAL_TABLET | Freq: Four times a day (QID) | ORAL | Status: DC | PRN
  Filled 2014-02-20: qty 1

## 2014-02-20 MED ORDER — HEPARIN SODIUM (PORCINE) 5000 UNIT/ML IJ SOLN
5000.0000 [IU] | Freq: Three times a day (TID) | INTRAMUSCULAR | Status: DC
  Administered 2014-02-21 – 2014-02-24 (×12): 5000 [IU] via SUBCUTANEOUS
  Filled 2014-02-20 (×21): qty 1

## 2014-02-20 NOTE — ED Notes (Addendum)
Code stroke cancelled 

## 2014-02-20 NOTE — ED Notes (Signed)
Patient has just arrived to [place].  Patient to scanner with [facility].

## 2014-02-20 NOTE — ED Notes (Signed)
Patient placed into gown and on monitor upon arrival to room. Pt being monitored by 12 lead, pulse ox, and blood pressure.

## 2014-02-20 NOTE — ED Notes (Signed)
Patient placed in room. 

## 2014-02-20 NOTE — ED Notes (Signed)
Pt c/o dizziness and blurry vision couple days ago, blurry vision started today

## 2014-02-20 NOTE — ED Notes (Signed)
[facility] getting pt on their stretcher. Pt denies pain. EDP states can get EKG at [facility] to not delay pt care.

## 2014-02-20 NOTE — ED Provider Notes (Signed)
CSN: [id number]     Arrival date & time 02/20/14  1647 History  This chart was scribed for Sharyon Cable, MD by Martinique Peace, ED Scribe. The patient was seen in [place]. The patient's care was started at 4:54 PM.     Chief Complaint  Patient presents with  . Code Stroke     Patient is a 63 y.o. female presenting with Acute Neurological Problem. The history is provided by the patient. The history is limited by the condition of the patient. No language interpreter was used.  Cerebrovascular Accident This is a new problem. The problem occurs constantly. The problem has been gradually worsening. Pertinent negatives include no chest pain and no shortness of breath. Nothing aggravates the symptoms. Nothing relieves the symptoms.   HPI Comments: CLAUDEAN LEAVELLE is a 63 y.o. female with h/o of cancer who presents to the Emergency Department complaining of dizziness onset around 3:30 today . She is stlll experiencing dizziness that is exacerbated by ambulation. She reports "difficulty seeing" She denies pain  Past Medical History  Diagnosis Date  . Hypertension   . High cholesterol   . Cancer   . Breast cancer   . Allergy     seasonal allergies  . Arthritis   . Anxiety   . Breast mass, left 2014  . Breast mass, right 2014  . 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 Depression   . Lymphedema of arm     right arm   Past Surgical History  Procedure Laterality Date  . Cholecystectomy    . Tubal ligation    . Tonsillectomy    . Breast biopsy  10/02/2012    Procedure: BREAST BIOPSY;  Surgeon: Donato Heinz, MD;  Location: [facility];  Service: General;  Laterality: Right;  . Portacath placement Left 10/20/2012  . Breast biopsy Left 10/20/2012    Procedure: BREAST BIOPSY;  Surgeon: Donato Heinz, MD;  Location: [facility];  Service: General;  Laterality: Left;  Procedure end 1142  . Portacath placement Left 10/20/2012    Procedure: INSERTION PORT-A-CATH;  Surgeon: Donato Heinz, MD;  Location: [facility];  Service: General;  Laterality: Left;  Procedure began 6195; left subclavian  . Simple mastectomy with axillary sentinel node biopsy Bilateral 04/25/2013    Procedure: SIMPLE MASTECTOMY;  Surgeon: Donato Heinz, MD;  Location: [facility];  Service: General;  Laterality: Bilateral;   Family History  Problem Relation Age of Onset  . Heart attack Father   . Cancer Maternal Aunt     colon cancer  . Stroke Maternal Uncle   . Cancer Maternal Grandmother     colon cancer  . Cancer Maternal Aunt     lung cancer  . COPD Daughter    History  Substance Use Topics  . Smoking status: Never Smoker   . Smokeless tobacco: Never Used  . Alcohol Use: 0.6 oz/week    1 Glasses of wine per week   OB History   Grav Para Term Preterm Abortions TAB SAB Ect Mult Living                 Review of Systems  Unable to perform ROS: Acuity of condition  Respiratory: Negative for shortness of breath.   Cardiovascular: Negative for chest pain.      Allergies  Codeine  Home Medications   Prior to Admission medications   Medication Sig Start Date End Date Taking? Authorizing Provider  anastrozole (ARIMIDEX) 1 MG tablet Take  1 tablet (1 mg total) by mouth daily. 06/05/13   Farrel Gobble, MD  gabapentin (NEURONTIN) 300 MG capsule Take 1 capsule twice a day or as directed 01/01/14   Farrel Gobble, MD  HYDROcodone-acetaminophen (NORCO/VICODIN) 5-325 MG per tablet Take 1 tablet by mouth every 4 (four) hours as needed for moderate pain or severe pain. 09/18/13   Farrel Gobble, MD  ibuprofen (ADVIL,MOTRIN) 200 MG tablet Take 200 mg by mouth every 6 (six) hours as needed. Pain.    Historical Provider, MD  lidocaine-prilocaine (EMLA) cream Apply topically once. Apply a quarter sized amount to port site 1 hour prior to chemo. Do not rub in. Cover with plastic.    Historical Provider, MD  lisinopril (PRINIVIL,ZESTRIL) 20 MG tablet Take 20 mg by mouth daily.    Historical  Provider, MD  lisinopril-hydrochlorothiazide (PRINZIDE,ZESTORETIC) 20-25 MG per tablet Take 1 tablet by mouth daily.    Historical Provider, MD  LORazepam (ATIVAN) 1 MG tablet Take 1 tablet (1 mg total) by mouth every 4 (four) hours as needed for sleep (nausea/vomiting). 02/12/14   Baird Cancer, PA-C  magnesium oxide (MAG-OX) 400 (241.3 MG) MG tablet Take 1 tablet (400 mg total) by mouth 3 (three) times daily. 07/17/13   Farrel Gobble, MD  ondansetron (ZOFRAN) 8 MG tablet Take 8 mg by mouth. Starting the day after chemo, take 1 tablet in the am and 1 tablet in the pm for 2 days. Then may take 1 tablet two times a day IF needed for nausea/vomiting.    Historical Provider, MD  potassium chloride SA (K-DUR,KLOR-CON) 20 MEQ tablet Take 40 mEq by mouth 3 (three) times daily. 07/17/13   Farrel Gobble, MD  pravastatin (PRAVACHOL) 80 MG tablet Take 80 mg by mouth at bedtime.    Historical Provider, MD  prochlorperazine (COMPAZINE) 10 MG tablet Take 1 tablet (10 mg total) by mouth every 6 (six) hours as needed. 11/09/12   Baird Cancer, PA-C   Temp(Src) 98 F (36.7 C) (Oral)  Ht 5\' 8"  (1.727 m)  Wt 200 lb (90.719 kg)  BMI 30.42 kg/m2 Physical Exam CONSTITUTIONAL: Well developed/well nourished HEAD: Normocephalic/atraumatic EYES: EOMI/PERRL ENMT: Mucous membranes moist NECK: supple no meningeal signs CV: S1/S2 noted LUNGS: Lungs are clear to auscultation bilaterally, no apparent distress ABDOMEN: soft, nontender, no rebound or guarding NEURO: Pt is awake/alert, moves all extremitiesx4, no arm of leg drift, no facial droop,  EXTREMITIES: pulses normal, full ROM SKIN: warm, color normal PSYCH: no abnormalities of mood noted  ED Course  Procedures  DIAGNOSTIC STUDIES: Oxygen Saturation is 98% on room air, normal by my interpretation.    COORDINATION OF CARE: Treatment plan was discussed with patient who verbalizes understanding and agrees.  High concern for acute CVA with onset at  37 CT scan not available currently at this hospital Code stroke called at approximately 1658 D/w dr Leonel Ramsay at [facility] at 1700 Informed ED staff ([place]) at [facility] of patient at [place] to transport patient for CT head as well neuro evaluation at [facility]   Final diagnoses:  Stroke    Nursing notes including past medical history and social history reviewed and considered in documentation   I personally performed the services described in this documentation, which was scribed in my presence. The recorded information has been reviewed and is accurate.      Sharyon Cable, MD 02/20/14 [phone]

## 2014-02-20 NOTE — Consult Note (Signed)
Neurology Consultation Reason for Consult: Dizziness Referring Physician: Ledell Noss  CC: Dizziness  History is obtained from: Patient  HPI: Erika Nunez is a 63 y.o. female with a history of dizziness for the past 3 days. She states that started abruptly 3 days ago and has been persistent, though not necessarily getting much worse since that time. She states that she has had nausea, and did throw up some yesterday. She has been unable to walk without someone else helping her since this started on the seventh.   LKW: 02/17/14 tpa given?: no, outside of window    ROS: A 14 point ROS was performed and is negative except as noted in the HPI.  Past Medical History  Diagnosis Date  . Hypertension   . High cholesterol   . Cancer   . Breast cancer   . Allergy     seasonal allergies  . Arthritis   . Anxiety   . Breast mass, left 2014  . Breast mass, right 2014  . 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 Depression   . Lymphedema of arm     right arm    Family History: Heart attack-father  Social History: Tob: Denies  Exam: Current vital signs: BP 119/70  Pulse 90  Temp(Src) 98 F (36.7 C) (Oral)  Resp 14  Ht 5\' 8"  (1.727 m)  Wt 90.719 kg (200 lb)  BMI 30.42 kg/m2  SpO2 96% Vital signs in last 24 hours: Temp:  [98 F (36.7 C)] 98 F (36.7 C) (06/10 1705) Pulse Rate:  [83-105] 90 (06/10 1830) Resp:  [14-21] 14 (06/10 1830) BP: (119-131)/(70-96) 119/70 mmHg (06/10 1830) SpO2:  [96 %-99 %] 96 % (06/10 1830) Weight:  [90.719 kg (200 lb)] 90.719 kg (200 lb) (06/10 1657)  General: In bed, NAD CV: Regular rate and rhythm Mental Status: Patient is awake, alert, oriented to person, place, month, year, and situation. Immediate and remote memory are intact. Patient is able to give a clear and coherent history. No signs of aphasia or neglect Cranial Nerves: II: Visual Fields are full. Pupils are equal, round, and reactive to light.  Discs are  difficult to visualize. III,IV, VI: EOMI without ptosis or diploplia.  V: Facial sensation is symmetric to temperature VII: Facial movement is symmetric.  VIII: hearing is intact to voice X: Uvula elevates symmetrically XI: Shoulder shrug is symmetric. XII: tongue is midline without atrophy or fasciculations.  Motor: Tone is normal. Bulk is normal. 5/5 strength was present in all four extremities.  Sensory: Sensation is decreased in the right arm. As well as bilateral stocking distribution. Deep Tendon Reflexes: Decreased throughout Cerebellar: FNF and HKS are ataxic on the right, possible mild tremor on the left but not clear. Gait: Not tested secondary to patient safety concerns.    I have reviewed labs in epic and the results pertinent to this consultation are: Mildly elevated creatinine  I have reviewed the images obtained:CT head- mass vs CVA   Impression: 63 yo F with cerebellar mass. The imaging appearance is more characteristic of a mass, I suspect that this is what this represents. Her history of sudden onset, however, is unusual for a mass of the size. An MRI could help differentiate and rule out stroke as possible cause.  Recommendations: 1) MRI brain with/without contrast 2) would favor neurosurgical consultation given hydrocephalus. 3) further recommendations pending imaging.   Roland Rack, MD [facility] [phone]  If 7pm- 7am, please page neurology on call as listed in  [facility].

## 2014-02-20 NOTE — ED Notes (Signed)
No need for in/out to collect urine, collect whenever pt able to urinate per Dr. Canary Brim.

## 2014-02-20 NOTE — ED Notes (Signed)
Encouraged pt to ring call bell if she felt she need to use the restroom

## 2014-02-20 NOTE — ED Provider Notes (Signed)
CSN: [id number]     Arrival date & time 02/20/14  1647 History   First MD Initiated Contact with Patient 02/20/14 1652     Chief Complaint  Patient presents with  . Code Stroke     (Consider location/radiation/quality/duration/timing/severity/associated sxs/prior Treatment) HPI Pt presenting as a code stroke notification from [facility] ED.  Per chart review she had acute onset of symptoms at 3:30pm today. However, pt states she had acute onset of symptoms 3 days ago.  She feels senstaion of room spinning and like her vision is hazy.  Has felt nauseated and had one episode of emesis.  No fever.  No focal weakness or numbness. No changes in speech. No double vision.  Has not had similar symptoms in the past. Of note, she states that she just finished treatment for breast cancer several months ago successfully.  No headache.  States vertigo is worse when moving her head to the side quickly. There are no other associated systemic symptoms, there are no other alleviating or modifying factors.   Past Medical History  Diagnosis Date  . Hypertension   . High cholesterol   . Cancer   . Breast cancer   . Allergy     seasonal allergies  . Arthritis   . Anxiety   . Breast mass, left 2014  . Breast mass, right 2014  . 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 Depression   . Lymphedema of arm     right arm   Past Surgical History  Procedure Laterality Date  . Cholecystectomy    . Tubal ligation    . Tonsillectomy    . Breast biopsy  10/02/2012    Procedure: BREAST BIOPSY;  Surgeon: Donato Heinz, MD;  Location: [facility];  Service: General;  Laterality: Right;  . Portacath placement Left 10/20/2012  . Breast biopsy Left 10/20/2012    Procedure: BREAST BIOPSY;  Surgeon: Donato Heinz, MD;  Location: [facility];  Service: General;  Laterality: Left;  Procedure end 1142  . Portacath placement Left 10/20/2012    Procedure: INSERTION PORT-A-CATH;  Surgeon: Donato Heinz, MD;  Location: [facility];  Service: General;  Laterality: Left;  Procedure began 7829; left subclavian  . Simple mastectomy with axillary sentinel node biopsy Bilateral 04/25/2013    Procedure: SIMPLE MASTECTOMY;  Surgeon: Donato Heinz, MD;  Location: [facility];  Service: General;  Laterality: Bilateral;   Family History  Problem Relation Age of Onset  . Heart attack Father   . Cancer Maternal Aunt     colon cancer  . Stroke Maternal Uncle   . Cancer Maternal Grandmother     colon cancer  . Cancer Maternal Aunt     lung cancer  . COPD Daughter    History  Substance Use Topics  . Smoking status: Never Smoker   . Smokeless tobacco: Never Used  . Alcohol Use: 0.6 oz/week    1 Glasses of wine per week   OB History   Grav Para Term Preterm Abortions TAB SAB Ect Mult Living                 Review of Systems ROS reviewed and all otherwise negative except for mentioned in HPI    Allergies  Codeine  Home Medications   Prior to Admission medications   Medication Sig Start Date End Date Taking? Authorizing Provider  anastrozole (ARIMIDEX) 1 MG tablet Take 1 tablet (1 mg total) by mouth daily. 06/05/13  Yes Farrel Gobble,  MD  gabapentin (NEURONTIN) 300 MG capsule Take 300 mg by mouth 2 (two) times daily.   Yes Historical Provider, MD  ibuprofen (ADVIL,MOTRIN) 200 MG tablet Take 200 mg by mouth every 6 (six) hours as needed. Pain.   Yes Historical Provider, MD  lidocaine-prilocaine (EMLA) cream Apply topically once. Apply a quarter sized amount to port site 1 hour prior to chemo. Do not rub in. Cover with plastic.   Yes Historical Provider, MD  lisinopril (PRINIVIL,ZESTRIL) 20 MG tablet Take 20 mg by mouth daily.   Yes Historical Provider, MD  lisinopril-hydrochlorothiazide (PRINZIDE,ZESTORETIC) 20-25 MG per tablet Take 1 tablet by mouth daily.   Yes Historical Provider, MD  LORazepam (ATIVAN) 1 MG tablet Take 1 mg by mouth every 4 (four) hours as needed for anxiety.   Yes Historical Provider, MD   magnesium oxide (MAG-OX) 400 MG tablet Take 400 mg by mouth 3 (three) times daily.   Yes Historical Provider, MD  potassium chloride SA (K-DUR,KLOR-CON) 20 MEQ tablet Take 40 mEq by mouth 3 (three) times daily. 07/17/13  Yes Farrel Gobble, MD  pravastatin (PRAVACHOL) 80 MG tablet Take 80 mg by mouth at bedtime.   Yes Historical Provider, MD  ondansetron (ZOFRAN) 8 MG tablet Take 8 mg by mouth. Starting the day after chemo, take 1 tablet in the am and 1 tablet in the pm for 2 days. Then may take 1 tablet two times a day IF needed for nausea/vomiting.    Historical Provider, MD   BP 124/55  Pulse 81  Temp(Src) 98 F (36.7 C) (Oral)  Resp 17  Ht 5' 8"  (1.727 m)  Wt 200 lb (90.719 kg)  BMI 30.42 kg/m2  SpO2 97% Vitals reviewed Physical Exam Physical Examination: General appearance - alert, well appearing, and in no distress Mental status - alert, oriented to person, place, and time Eyes - pupils equal and reactive, extraocular eye movements intact, no nystagmus Mouth - mucous membranes moist, pharynx normal without lesions Chest - clear to auscultation, no wheezes, rales or rhonchi, symmetric air entry Heart - normal rate, regular rhythm, normal S1, S2, no murmurs, rubs, clicks or gallops Abdomen - soft, nontender, nondistended, no masses or organomegaly Neurological - alert, oriented x 3, cranial nerves 2-12 tested and intact, strength 5/5 in extremities x 4, sensation intact Extremities - peripheral pulses normal, no pedal edema, no clubbing or cyanosis Skin - normal coloration and turgor, no rashes  ED Course  Procedures (including critical care time)  7:01 PM neurology has seen patient, I have talked with neurosurgery, he is going to review the scan and call me back.  Pt will be admitted to medicine.    8:34 PM d/w Dr. Alcario Drought, [facility]- he will see patient once she is back from MRI.  Likely admission to stepdown bed.   8:55 PM call from radiologist, he states the area is most c/w  a mass- he is still evaluating lesion to determine whether this is a met versus benign tumor.  D/w [facility]- pt to be admitted to stepdown bed.     Date: 02/20/2014  Rate: 90  Rhythm: normal sinus rhythm  QRS Axis: normal  Intervals: normal  ST/T Wave abnormalities: normal  Conduction Disutrbances: none  Narrative Interpretation: unremarkable EKG not available in epic for interpretation in muse      CRITICAL CARE Performed by: Threasa Beards Total critical care time: 35 Critical care time was exclusive of separately billable procedures and treating other patients. Critical care was necessary to treat  or prevent imminent or life-threatening deterioration. Critical care was time spent personally by me on the following activities: development of treatment plan with patient and/or surrogate as well as nursing, discussions with consultants, evaluation of patient's response to treatment, examination of patient, obtaining history from patient or surrogate, ordering and performing treatments and interventions, ordering and review of laboratory studies, ordering and review of radiographic studies, pulse oximetry and re-evaluation of patient's condition. Labs Review Labs Reviewed  COMPREHENSIVE METABOLIC PANEL - Abnormal; Notable for the following:    Glucose, Bld 126 (*)    BUN 24 (*)    Creatinine, Ser 1.26 (*)    Total Bilirubin 0.2 (*)    GFR calc non Af Amer 44 (*)    GFR calc Af Amer 51 (*)    All other components within normal limits  ETHANOL  PROTIME-INR  APTT  CBC  DIFFERENTIAL  URINE RAPID DRUG SCREEN (HOSP PERFORMED)  URINALYSIS, ROUTINE W REFLEX MICROSCOPIC  CBC  BASIC METABOLIC PANEL  I-STAT CHEM 8, ED  I-STAT TROPOININ, ED    Imaging Review Ct Head Wo Contrast  02/20/2014   CLINICAL DATA:  Dizziness with blurred vision. History of breast cancer.  EXAM: CT HEAD WITHOUT CONTRAST  TECHNIQUE: Contiguous axial images were obtained from the base of the skull through the  vertex without intravenous contrast.  COMPARISON:  PET-CT 03/13/2013.  Head CT 09/03/2005.  FINDINGS: There is new vasogenic edema throughout the cerebellum adjacent to an apparent 3.9 x 3.3 cm mass involving the right cerebellar hemisphere posteriorly (image number 8). There is obliteration of the subarachnoid spaces in the posterior fossa. The fourth ventricle is effaced. There is some mass effect on the pons.  Compared with the prior examination, there is new mild dilatation of the third and lateral ventricles. No definite supratentorial lesions are identified. There is no midline shift or evidence acute intracranial hemorrhage.  The visualized paranasal sinuses, mastoid air cells and middle ears are clear. The calvarium is intact.  IMPRESSION: New mass lesion involving the right cerebellar hemisphere with associated vasogenic edema, localized mass effect and early obstructive hydrocephalus. Appearance is most consistent with metastatic breast cancer. MRI within without contrast may be helpful for further evaluation.  Critical Value/emergent results were called by telephone at the time of interpretation on 02/20/2014 at 6:00 PM to Dr. Canary Brim, who verbally acknowledged these results.   Electronically Signed   By: Camie Patience M.D.   On: 02/20/2014 18:01     EKG Interpretation None      MDM   Final diagnoses:  Stroke  cerebellar mass  Pt presenting with c/o vertigo and changes in vision.  Code stroke cancelled as symptoms had been ongoing for sevearl days.  Head CT shows mass lesion in right cerebellar hemisphere.  Neurology has seen patient, I have consulted neurosurgery as noted above.  MRI shows mass lesion - pt will be admitted to stepdown bed by [facility].  She remains awake, alert with reassuring neuro exam.      Threasa Beards, MD 02/20/14 2108

## 2014-02-20 NOTE — ED Provider Notes (Signed)
I evaluated the patient briefly on arrival to our facility (~1735). Patient was in no distress, with a patent airway. With concern for her new dizziness and hearing loss she was expeditiously sent to CT. Additional eval per colleagues.   Carmin Muskrat, MD 02/20/14 1907

## 2014-02-20 NOTE — ED Notes (Signed)
Pt leaving with [facility] now  

## 2014-02-20 NOTE — Consult Note (Signed)
CC:  Chief Complaint  Patient presents with  . Code Stroke    HPI: Erika Nunez is a 63 y.o. female presenting to the ED today with several day history of imbalance and HA. She does have a history of breast CA treated with bilateral mastectomy, breast radiation, and chemotherapy. Treatment completed in 10/2013 and she is maintained on Arimidex. The imbalance started 3-4 days ago and she says she was stumbling and bumping into walls when walking. Upon questioning, she does also admit to some HA over the past few days as well as difficulty with handwriting with the right hand. She does also admit to mild blurry vision. She denies any difficulty swallowing, speech difficulty, tinnitus, or weakness.   PMH: Past Medical History  Diagnosis Date  . Hypertension   . High cholesterol   . Cancer   . Breast cancer   . Allergy     seasonal allergies  . Arthritis   . Anxiety   . Breast mass, left 2014  . Breast mass, right 2014  . 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 Depression   . Lymphedema of arm     right arm    PSH: Past Surgical History  Procedure Laterality Date  . Cholecystectomy    . Tubal ligation    . Tonsillectomy    . Breast biopsy  10/02/2012    Procedure: BREAST BIOPSY;  Surgeon: Donato Heinz, MD;  Location: [facility];  Service: General;  Laterality: Right;  . Portacath placement Left 10/20/2012  . Breast biopsy Left 10/20/2012    Procedure: BREAST BIOPSY;  Surgeon: Donato Heinz, MD;  Location: [facility];  Service: General;  Laterality: Left;  Procedure end 1142  . Portacath placement Left 10/20/2012    Procedure: INSERTION PORT-A-CATH;  Surgeon: Donato Heinz, MD;  Location: [facility];  Service: General;  Laterality: Left;  Procedure began 6295; left subclavian  . Simple mastectomy with axillary sentinel node biopsy Bilateral 04/25/2013    Procedure: SIMPLE MASTECTOMY;  Surgeon: Donato Heinz, MD;  Location: [facility];  Service: General;  Laterality:  Bilateral;    SH: History  Substance Use Topics  . Smoking status: Never Smoker   . Smokeless tobacco: Never Used  . Alcohol Use: 0.6 oz/week    1 Glasses of wine per week    MEDS: Prior to Admission medications   Medication Sig Start Date End Date Taking? Authorizing Provider  anastrozole (ARIMIDEX) 1 MG tablet Take 1 tablet (1 mg total) by mouth daily. 06/05/13  Yes Farrel Gobble, MD  gabapentin (NEURONTIN) 300 MG capsule Take 300 mg by mouth 2 (two) times daily.   Yes Historical Provider, MD  ibuprofen (ADVIL,MOTRIN) 200 MG tablet Take 200 mg by mouth every 6 (six) hours as needed. Pain.   Yes Historical Provider, MD  lidocaine-prilocaine (EMLA) cream Apply topically once. Apply a quarter sized amount to port site 1 hour prior to chemo. Do not rub in. Cover with plastic.   Yes Historical Provider, MD  lisinopril (PRINIVIL,ZESTRIL) 20 MG tablet Take 20 mg by mouth daily.   Yes Historical Provider, MD  lisinopril-hydrochlorothiazide (PRINZIDE,ZESTORETIC) 20-25 MG per tablet Take 1 tablet by mouth daily.   Yes Historical Provider, MD  LORazepam (ATIVAN) 1 MG tablet Take 1 mg by mouth every 4 (four) hours as needed for anxiety.   Yes Historical Provider, MD  magnesium oxide (MAG-OX) 400 MG tablet Take 400 mg by mouth 3 (three) times daily.   Yes Historical Provider,  MD  potassium chloride SA (K-DUR,KLOR-CON) 20 MEQ tablet Take 40 mEq by mouth 3 (three) times daily. 07/17/13  Yes Farrel Gobble, MD  pravastatin (PRAVACHOL) 80 MG tablet Take 80 mg by mouth at bedtime.   Yes Historical Provider, MD  ondansetron (ZOFRAN) 8 MG tablet Take 8 mg by mouth. Starting the day after chemo, take 1 tablet in the am and 1 tablet in the pm for 2 days. Then may take 1 tablet two times a day IF needed for nausea/vomiting.    Historical Provider, MD    ALLERGY: Allergies  Allergen Reactions  . Codeine Hives    ROS: ROS  NEUROLOGIC EXAM: Awake, alert, oriented Memory and concentration grossly  intact Speech fluent, appropriate CN grossly intact Motor exam: Upper Extremities Deltoid Bicep Tricep Grip  Right 5/5 5/5 5/5 5/5  Left 5/5 5/5 5/5 5/5   Lower Extremity IP Quad PF DF EHL  Right 5/5 5/5 5/5 5/5 5/5  Left 5/5 5/5 5/5 5/5 5/5   Sensation grossly intact to LT Ataxic with F->N on right  IMGAING: MRI brain w/w/o Gad demonstrates a 4x4.5cm right cerebellar hemisphere homogeneously enhancing mass with surrounding edema and effacement of the fourth ventricle.   IMPRESSION: - 63 y.o. female with ataxia and right cerebellar lesion - DDx includes solitary metastatic disease vs meningioma  PLAN: - Admit to stepdown - Decadron 4 mg Q6 Hrs - Will likely require resection for diagnosis and relief of mass effect  I did review the MRI findings with the patient and her family. I briefly reviewed the possibility of metastatic disease vs meningioma and the likely need for surgery.

## 2014-02-20 NOTE — H&P (Signed)
[facility] History and Physical  Erika Nunez VEH:[id number] DOB: June 06, 1951 DOA: 02/20/2014  Referring physician: EDP PCP: [name], MD   Chief Complaint: dizziness   HPI: Erika Nunez is a 63 y.o. female  H/o locally advanced BRCA undergoing treatment for the past year.  She presents with 3 day h/o dizziness.  Symptoms onset abruptly 3 days ago and have been persistent and unchanged through that time.  Associated with nausea, one episode of vomiting yesterday.  Unable to ambulate without someone else helping her.  Review of Systems: Systems reviewed.  As above, otherwise negative  Past Medical History  Diagnosis Date  . Hypertension   . High cholesterol   . Cancer   . Breast cancer   . Allergy     seasonal allergies  . Arthritis   . Anxiety   . Breast mass, left 2014  . Breast mass, right 2014  . 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 Depression   . Lymphedema of arm     right arm   Past Surgical History  Procedure Laterality Date  . Cholecystectomy    . Tubal ligation    . Tonsillectomy    . Breast biopsy  10/02/2012    Procedure: BREAST BIOPSY;  Surgeon: Donato Heinz, MD;  Location: [facility];  Service: General;  Laterality: Right;  . Portacath placement Left 10/20/2012  . Breast biopsy Left 10/20/2012    Procedure: BREAST BIOPSY;  Surgeon: Donato Heinz, MD;  Location: [facility];  Service: General;  Laterality: Left;  Procedure end 1142  . Portacath placement Left 10/20/2012    Procedure: INSERTION PORT-A-CATH;  Surgeon: Donato Heinz, MD;  Location: [facility];  Service: General;  Laterality: Left;  Procedure began 8366; left subclavian  . Simple mastectomy with axillary sentinel node biopsy Bilateral 04/25/2013    Procedure: SIMPLE MASTECTOMY;  Surgeon: Donato Heinz, MD;  Location: [facility];  Service: General;  Laterality: Bilateral;   Social History:  reports that she has never smoked. She has never used smokeless tobacco. She  reports that she drinks about .6 ounces of alcohol per week. She reports that she does not use illicit drugs.  Allergies  Allergen Reactions  . Codeine Hives    Family History  Problem Relation Age of Onset  . Heart attack Father   . Cancer Maternal Aunt     colon cancer  . Stroke Maternal Uncle   . Cancer Maternal Grandmother     colon cancer  . Cancer Maternal Aunt     lung cancer  . COPD Daughter      Prior to Admission medications   Medication Sig Start Date End Date Taking? Authorizing Provider  anastrozole (ARIMIDEX) 1 MG tablet Take 1 tablet (1 mg total) by mouth daily. 06/05/13  Yes Farrel Gobble, MD  gabapentin (NEURONTIN) 300 MG capsule Take 300 mg by mouth 2 (two) times daily.   Yes Historical Provider, MD  ibuprofen (ADVIL,MOTRIN) 200 MG tablet Take 200 mg by mouth every 6 (six) hours as needed. Pain.   Yes Historical Provider, MD  lidocaine-prilocaine (EMLA) cream Apply topically once. Apply a quarter sized amount to port site 1 hour prior to chemo. Do not rub in. Cover with plastic.   Yes Historical Provider, MD  lisinopril (PRINIVIL,ZESTRIL) 20 MG tablet Take 20 mg by mouth daily.   Yes Historical Provider, MD  lisinopril-hydrochlorothiazide (PRINZIDE,ZESTORETIC) 20-25 MG per tablet Take 1 tablet by mouth daily.   Yes Historical Provider,  MD  LORazepam (ATIVAN) 1 MG tablet Take 1 mg by mouth every 4 (four) hours as needed for anxiety.   Yes Historical Provider, MD  magnesium oxide (MAG-OX) 400 MG tablet Take 400 mg by mouth 3 (three) times daily.   Yes Historical Provider, MD  potassium chloride SA (K-DUR,KLOR-CON) 20 MEQ tablet Take 40 mEq by mouth 3 (three) times daily. 07/17/13  Yes Farrel Gobble, MD  pravastatin (PRAVACHOL) 80 MG tablet Take 80 mg by mouth at bedtime.   Yes Historical Provider, MD  ondansetron (ZOFRAN) 8 MG tablet Take 8 mg by mouth. Starting the day after chemo, take 1 tablet in the am and 1 tablet in the pm for 2 days. Then may take 1 tablet  two times a day IF needed for nausea/vomiting.    Historical Provider, MD   Physical Exam: Filed Vitals:   02/20/14 2129  BP:   Pulse: 98  Temp:   Resp: 18    BP 148/73  Pulse 98  Temp(Src) 98 F (36.7 C) (Oral)  Resp 18  Ht 5' 8"  (1.727 m)  Wt 90.719 kg (200 lb)  BMI 30.42 kg/m2  SpO2 99%  General Appearance:    Alert, oriented, no distress, appears stated age  Head:    Normocephalic, atraumatic  Eyes:    PERRL, EOMI, sclera non-icteric        Nose:   Nares without drainage or epistaxis. Mucosa, turbinates normal  Throat:   Moist mucous membranes. Oropharynx without erythema or exudate.  Neck:   Supple. No carotid bruits.  No thyromegaly.  No lymphadenopathy.   Back:     No CVA tenderness, no spinal tenderness  Lungs:     Clear to auscultation bilaterally, without wheezes, rhonchi or rales  Chest wall:    No tenderness to palpitation  Heart:    Regular rate and rhythm without murmurs, gallops, rubs  Abdomen:     Soft, non-tender, nondistended, normal bowel sounds, no organomegaly  Genitalia:    deferred  Rectal:    deferred  Extremities:   No clubbing, cyanosis or edema.  Pulses:   2+ and symmetric all extremities  Skin:   Skin color, texture, turgor normal, no rashes or lesions  Lymph nodes:   Cervical, supraclavicular, and axillary nodes normal  Neurologic:   FNF ataxic on the R side.    Labs on Admission:  Basic Metabolic Panel:  Recent Labs Lab 02/20/14 1659  NA 140  K 4.0  CL 99  CO2 22  GLUCOSE 126*  BUN 24*  CREATININE 1.26*  CALCIUM 10.0   Liver Function Tests:  Recent Labs Lab 02/20/14 1659  AST 17  ALT 18  ALKPHOS 80  BILITOT 0.2*  PROT 7.3  ALBUMIN 4.2   No results found for this basename: LIPASE, AMYLASE,  in the last 168 hours No results found for this basename: AMMONIA,  in the last 168 hours CBC:  Recent Labs Lab 02/20/14 1659  WBC 10.3  NEUTROABS 7.6  HGB 13.9  HCT 39.8  MCV 94.8  PLT 244   Cardiac Enzymes: No  results found for this basename: CKTOTAL, CKMB, CKMBINDEX, TROPONINI,  in the last 168 hours  BNP (last 3 results) No results found for this basename: PROBNP,  in the last 8760 hours CBG: No results found for this basename: GLUCAP,  in the last 168 hours  Radiological Exams on Admission: Ct Head Wo Contrast  02/20/2014   CLINICAL DATA:  Dizziness with blurred vision. History of  breast cancer.  EXAM: CT HEAD WITHOUT CONTRAST  TECHNIQUE: Contiguous axial images were obtained from the base of the skull through the vertex without intravenous contrast.  COMPARISON:  PET-CT 03/13/2013.  Head CT 09/03/2005.  FINDINGS: There is new vasogenic edema throughout the cerebellum adjacent to an apparent 3.9 x 3.3 cm mass involving the right cerebellar hemisphere posteriorly (image number 8). There is obliteration of the subarachnoid spaces in the posterior fossa. The fourth ventricle is effaced. There is some mass effect on the pons.  Compared with the prior examination, there is new mild dilatation of the third and lateral ventricles. No definite supratentorial lesions are identified. There is no midline shift or evidence acute intracranial hemorrhage.  The visualized paranasal sinuses, mastoid air cells and middle ears are clear. The calvarium is intact.  IMPRESSION: New mass lesion involving the right cerebellar hemisphere with associated vasogenic edema, localized mass effect and early obstructive hydrocephalus. Appearance is most consistent with metastatic breast cancer. MRI within without contrast may be helpful for further evaluation.  Critical Value/emergent results were called by telephone at the time of interpretation on 02/20/2014 at 6:00 PM to Dr. Canary Brim, who verbally acknowledged these results.   Electronically Signed   By: Camie Patience M.D.   On: 02/20/2014 18:01   Mr Jeri Cos KG Contrast  02/20/2014   CLINICAL DATA:  63 year old female with dizziness and visual changes. History of breast cancer in 2014.  Abnormal head CT. Initial encounter.  EXAM: MRI HEAD WITHOUT AND WITH CONTRAST  TECHNIQUE: Multiplanar, multiecho pulse sequences of the brain and surrounding structures were obtained without and with intravenous contrast.  CONTRAST:  60m MULTIHANCE GADOBENATE DIMEGLUMINE 529 MG/ML IV SOLN  COMPARISON:  Head CT without contrast 02/20/2014. Head CT without contrast 09/03/2005.  FINDINGS: The posterior fossa lesion corresponds to a rounded mass with generally solid enhancement throughout, located along the right periphery of the posterior fossa and encompassing 38 x 47 x 36 mm (AP by transverse by CC). Multiplanar T2 weighted imaging suggests a subtle CSF cleft between the mass an the abnormal cerebellum (series 6, image 5, series 14, image 14). However, no definite dural tail is identified and there is indistinct abnormal enhancement along the margins of the mass. On precontrast images the bulk of the mass is isointense to gray matter, a much of the mass shows restricted diffusion (sparing the central aspect -series 400, image 7). No associated blood products or mineralization on T2 * imaging.  No other intracranial mass or enhancing lesion identified in the brain.  Severe posterior fossa mass effect, effacing the caudal aspect of the fourth ventricle and resulting in mild cerebellar tonsillar ectopia. T2 and FLAIR hyperintensity throughout the right cerebellar hemisphere in extending to the vermis and slightly crossing midline compatible with vasogenic edema. Partially effaced pre pontine and pre medullary cisterns.  Temporal horns have enlarged compared to 2006, and there is mild periventricular FLAIR hyperintensity in keeping with transependymal edema.  No supratentorial mass effect. Major intracranial vascular flow voids are preserved with dominant distal left vertebral artery suspected. Scattered nonspecific cerebral white matter T2 and FLAIR hyperintensity. Negative pituitary. Negative visualized cervical  spine. Visualized bone marrow signal is within normal limits. No acute intracranial hemorrhage identified.  Visualized orbit soft tissues are within normal limits. Mild paranasal sinus mucosal thickening and mastoid fluid. Negative visualized nasopharynx. Visualized scalp soft tissues are within normal limits.  IMPRESSION: 1. Solitary right posterior fossa enhancing mass, 38 x 47 x 36 mm. Meningioma is favored  over solitary metastasis. 2. Severe cerebellar edema and posterior fossa mass effect with a degree of fourth ventricular obstruction and early ventriculomegaly. Partially effaced basilar cisterns. Recommend neurosurgery consult. The above discussed by telephone with Dr. Canary Brim in the ED on 02/20/2014 at 2054 hrs.   Electronically Signed   By: Lars Pinks M.D.   On: 02/20/2014 21:29    EKG: Independently reviewed.  Assessment/Plan Active Problems:   Breast cancer   Brain mass   Obstructive hydrocephalus   Vasogenic brain edema   1. Brain mass - DDX at this point is solitary met from BRCA vs meningioma.  Radiology favors meningioma over solitary BRCA brain met, I remain somewhat skeptical of this given the timing of onset in relation to treatment for BRCA and the rapid onset of symptoms. 1. Obstructive hydrocephalus - Regardless of wether this represents a BRCA solitary met vs meningioma, I have been informed by Neurosurgery that we will be finding out definitively which it is, as they plan on performing surgical resection of the tumor given the dangerous mass effect on surrounding structures.  Please see their note for details. 2. Vasogenic edema secondary to brain mass - decadron 78m load and then [id number] 2. BRCA - continue hormone therapy.      Code Status: Full  Family Communication: Family at bedside Disposition Plan: Admit to inpatient   Time spent: 7058in  GARDNER, JARED M. [facility] Pager [phone] 7AM-7PM, please contact the day team taking care of the  patient [email or web address] Password [email or web address]/06/2014, 9:42 PM

## 2014-02-21 ENCOUNTER — Encounter (HOSPITAL_COMMUNITY): Payer: Self-pay

## 2014-02-21 ENCOUNTER — Other Ambulatory Visit: Payer: Self-pay | Admitting: Neurosurgery

## 2014-02-21 DIAGNOSIS — I10 Essential (primary) hypertension: Secondary | ICD-10-CM

## 2014-02-21 DIAGNOSIS — D649 Anemia, unspecified: Secondary | ICD-10-CM

## 2014-02-21 DIAGNOSIS — G9389 Other specified disorders of brain: Secondary | ICD-10-CM | POA: Diagnosis present

## 2014-02-21 LAB — CBC
HCT: 39.1 % (ref 36.0–46.0)
Hemoglobin: 13.2 g/dL (ref 12.0–15.0)
MCH: 32 pg (ref 26.0–34.0)
MCHC: 33.8 g/dL (ref 30.0–36.0)
MCV: 94.9 fL (ref 78.0–100.0)
PLATELETS: 221 10*3/uL (ref 150–400)
RBC: 4.12 MIL/uL (ref 3.87–5.11)
RDW: 13.2 % (ref 11.5–15.5)
WBC: 9.8 10*3/uL (ref 4.0–10.5)

## 2014-02-21 LAB — BASIC METABOLIC PANEL
BUN: 21 mg/dL (ref 6–23)
CHLORIDE: 101 meq/L (ref 96–112)
CO2: 23 mEq/L (ref 19–32)
CREATININE: 1.14 mg/dL — AB (ref 0.50–1.10)
Calcium: 10.3 mg/dL (ref 8.4–10.5)
GFR, EST AFRICAN AMERICAN: 58 mL/min — AB (ref >90)
GFR, EST NON AFRICAN AMERICAN: 50 mL/min — AB (ref >90)
Glucose, Bld: 151 mg/dL — ABNORMAL HIGH (ref 70–99)
Potassium: 4.7 mEq/L (ref 3.7–5.3)
Sodium: 141 mEq/L (ref 137–147)

## 2014-02-21 LAB — MRSA PCR SCREENING: MRSA BY PCR: NEGATIVE

## 2014-02-21 NOTE — Progress Notes (Signed)
No issues overnight. Pt reports some improvement in HA since yesterday.  EXAM:  BP 115/73  Pulse 77  Temp(Src) 97.8 F (36.6 C) (Oral)  Resp 11  Ht 5\' 8"  (1.727 m)  Wt 90.9 kg (200 lb 6.4 oz)  BMI 30.48 kg/m2  SpO2 96%  Awake, alert, oriented  Speech fluent, appropriate  CN grossly intact  5/5 BUE/BLE  RUE/RLE ataxia  IMPRESSION:  63 y.o. female with right cerebellar hemisphere mass lesion: meningioma vs metastasis - Neurologically stable  PLAN: - Cont IV decadron - Plan on surgery Tues 6/16

## 2014-02-21 NOTE — Progress Notes (Signed)
Utilization review completed. Carsin Randazzo, RN, BSN. 

## 2014-02-21 NOTE — Progress Notes (Signed)
Patient ID: Erika Nunez  female  HBZ:[id number]    DOB: 19-Feb-1951    DOA: 02/20/2014  PCP: [name], MD  Assessment/Plan: Principal Problem:   Cerebellar mass with vasogenic brain edema - Continue serial neurochecks, neurosurgery consulted, planning for resection of the mass  - Continue Decadron, patient feels her symptoms are some what improved after starting Decadron - Neurology was also consulted, appreciate followup  Active Problems:   Breast cancer - Continue hormone therapy    Obstructive hydrocephalus:  - As #1, neurosurgery planning surgical resection of the tumor    Vasogenic brain edema - Continue Decadron   DVT Prophylaxis:  Code Status: Full code  Family Communication: Discussed with patient's son at the bedside  Disposition:  Consultants:  Neurosurgery, Dr. Kathyrn Sheriff  Neurology, Dr. Leonel Ramsay  Procedures:  None  Antibiotics:  None    Subjective: Patient seen and examined, feels less dizzy today on turning her head, son at the bedside. No headache or visual changes at this time  Objective: Weight change:  No intake or output data in the 24 hours ending 02/21/14 0910 Blood pressure 115/73, pulse 77, temperature 97.8 F (36.6 C), temperature source Oral, resp. rate 11, height 5\' 8"  (1.727 m), weight 90.9 kg (200 lb 6.4 oz), SpO2 96.00%.  Physical Exam: General: Alert and awake, oriented x3, not in any acute distress. CVS: S1-S2 clear, no murmur rubs or gallops Chest: clear to auscultation bilaterally, no wheezing, rales or rhonchi Abdomen: soft nontender, nondistended, normal bowel sounds  Extremities: no cyanosis, clubbing or edema noted bilaterally   Lab Results: Basic Metabolic Panel:  Recent Labs Lab 02/20/14 1659 02/21/14 0315  NA 140 141  K 4.0 4.7  CL 99 101  CO2 22 23  GLUCOSE 126* 151*  BUN 24* 21  CREATININE 1.26* 1.14*  CALCIUM 10.0 10.3   Liver Function Tests:  Recent Labs Lab 02/20/14 1659  AST  17  ALT 18  ALKPHOS 80  BILITOT 0.2*  PROT 7.3  ALBUMIN 4.2   No results found for this basename: LIPASE, AMYLASE,  in the last 168 hours No results found for this basename: AMMONIA,  in the last 168 hours CBC:  Recent Labs Lab 02/20/14 1659 02/21/14 0315  WBC 10.3 9.8  NEUTROABS 7.6  --   HGB 13.9 13.2  HCT 39.8 39.1  MCV 94.8 94.9  PLT 244 221   Cardiac Enzymes: No results found for this basename: CKTOTAL, CKMB, CKMBINDEX, TROPONINI,  in the last 168 hours BNP: No components found with this basename: POCBNP,  CBG: No results found for this basename: GLUCAP,  in the last 168 hours   Micro Results: Recent Results (from the past 240 hour(s))  MRSA PCR SCREENING     Status: None   Collection Time    02/21/14  7:24 AM      Result Value Ref Range Status   MRSA by PCR NEGATIVE  NEGATIVE Final   Comment:            The GeneXpert MRSA Assay (FDA     approved for NASAL specimens     only), is one component of a     comprehensive MRSA colonization     surveillance program. It is not     intended to diagnose MRSA     infection nor to guide or     monitor treatment for     MRSA infections.    Studies/Results: Ct Head Wo Contrast  02/20/2014  CLINICAL DATA:  Dizziness with blurred vision. History of breast cancer.  EXAM: CT HEAD WITHOUT CONTRAST  TECHNIQUE: Contiguous axial images were obtained from the base of the skull through the vertex without intravenous contrast.  COMPARISON:  PET-CT 03/13/2013.  Head CT 09/03/2005.  FINDINGS: There is new vasogenic edema throughout the cerebellum adjacent to an apparent 3.9 x 3.3 cm mass involving the right cerebellar hemisphere posteriorly (image number 8). There is obliteration of the subarachnoid spaces in the posterior fossa. The fourth ventricle is effaced. There is some mass effect on the pons.  Compared with the prior examination, there is new mild dilatation of the third and lateral ventricles. No definite supratentorial lesions  are identified. There is no midline shift or evidence acute intracranial hemorrhage.  The visualized paranasal sinuses, mastoid air cells and middle ears are clear. The calvarium is intact.  IMPRESSION: New mass lesion involving the right cerebellar hemisphere with associated vasogenic edema, localized mass effect and early obstructive hydrocephalus. Appearance is most consistent with metastatic breast cancer. MRI within without contrast may be helpful for further evaluation.  Critical Value/emergent results were called by telephone at the time of interpretation on 02/20/2014 at 6:00 PM to Dr. Canary Brim, who verbally acknowledged these results.   Electronically Signed   By: Camie Patience M.D.   On: 02/20/2014 18:01   Mr Jeri Cos NK Contrast  02/20/2014   CLINICAL DATA:  63 year old female with dizziness and visual changes. History of breast cancer in 2014. Abnormal head CT. Initial encounter.  EXAM: MRI HEAD WITHOUT AND WITH CONTRAST  TECHNIQUE: Multiplanar, multiecho pulse sequences of the brain and surrounding structures were obtained without and with intravenous contrast.  CONTRAST:  70mL MULTIHANCE GADOBENATE DIMEGLUMINE 529 MG/ML IV SOLN  COMPARISON:  Head CT without contrast 02/20/2014. Head CT without contrast 09/03/2005.  FINDINGS: The posterior fossa lesion corresponds to a rounded mass with generally solid enhancement throughout, located along the right periphery of the posterior fossa and encompassing 38 x 47 x 36 mm (AP by transverse by CC). Multiplanar T2 weighted imaging suggests a subtle CSF cleft between the mass an the abnormal cerebellum (series 6, image 5, series 14, image 14). However, no definite dural tail is identified and there is indistinct abnormal enhancement along the margins of the mass. On precontrast images the bulk of the mass is isointense to gray matter, a much of the mass shows restricted diffusion (sparing the central aspect -series 400, image 7). No associated blood products or  mineralization on T2 * imaging.  No other intracranial mass or enhancing lesion identified in the brain.  Severe posterior fossa mass effect, effacing the caudal aspect of the fourth ventricle and resulting in mild cerebellar tonsillar ectopia. T2 and FLAIR hyperintensity throughout the right cerebellar hemisphere in extending to the vermis and slightly crossing midline compatible with vasogenic edema. Partially effaced pre pontine and pre medullary cisterns.  Temporal horns have enlarged compared to 2006, and there is mild periventricular FLAIR hyperintensity in keeping with transependymal edema.  No supratentorial mass effect. Major intracranial vascular flow voids are preserved with dominant distal left vertebral artery suspected. Scattered nonspecific cerebral white matter T2 and FLAIR hyperintensity. Negative pituitary. Negative visualized cervical spine. Visualized bone marrow signal is within normal limits. No acute intracranial hemorrhage identified.  Visualized orbit soft tissues are within normal limits. Mild paranasal sinus mucosal thickening and mastoid fluid. Negative visualized nasopharynx. Visualized scalp soft tissues are within normal limits.  IMPRESSION: 1. Solitary right posterior fossa enhancing mass,  38 x 47 x 36 mm. Meningioma is favored over solitary metastasis. 2. Severe cerebellar edema and posterior fossa mass effect with a degree of fourth ventricular obstruction and early ventriculomegaly. Partially effaced basilar cisterns. Recommend neurosurgery consult. The above discussed by telephone with Dr. Canary Brim in the ED on 02/20/2014 at 2054 hrs.   Electronically Signed   By: Lars Pinks M.D.   On: 02/20/2014 21:29    Medications: Scheduled Meds: . anastrozole  1 mg Oral Daily  . dexamethasone  4 mg Intravenous 4 times per day  . gabapentin  300 mg Oral BID  . heparin  5,000 Units Subcutaneous 3 times per day  . lisinopril  20 mg Oral Daily   And  . hydrochlorothiazide  25 mg Oral Daily   . lisinopril  20 mg Oral Daily  . magnesium oxide  400 mg Oral TID  . potassium chloride SA  40 mEq Oral TID  . simvastatin  40 mg Oral q1800  . sodium chloride  3 mL Intravenous Q12H      LOS: 1 day   RAI,RIPUDEEP M.D. [facility] 02/21/2014, 9:10 AM Pager: [phone]  If 7PM-7AM, please contact night-coverage [email or web address] Password [email or web address]  **Disclaimer: This note was dictated with voice recognition software. Similar sounding words can inadvertently be transcribed and this note may contain transcription errors which may not have been corrected upon publication of note.**

## 2014-02-21 NOTE — Progress Notes (Signed)
Subjective: Feels slightly better since starting steroids.   Exam: Filed Vitals:   02/21/14 0732  BP: 115/73  Pulse: 77  Temp: 97.8 F (36.6 C)  Resp: 11   Gen: In bed, NAD MS: Awake, alert, interactive and appropriate Motor: MAEW Sensory: distally decreased to LT Cerebellar right sided ataxia still present   Impression: 63 yo F with right cerebellar mass. Appreciate neurosurgical consultation. At this time, it appears they are planning for resection of mass. I suspect that this is a slow growing mass and that sudden onset of symptoms likely correlated to CSF drainage more than direct mass effect.   Recommendations: 1) Appreciate neurosurgical consultation.  2) Please call if neurology can be of any further assistance.   Roland Rack, MD [facility] [phone]  If 7pm- 7am, please page neurology on call as listed in [place].

## 2014-02-22 DIAGNOSIS — I6350 Cerebral infarction due to unspecified occlusion or stenosis of unspecified cerebral artery: Secondary | ICD-10-CM

## 2014-02-22 MED ORDER — RANITIDINE HCL 150 MG/10ML PO SYRP
150.0000 mg | ORAL_SOLUTION | Freq: Two times a day (BID) | ORAL | Status: DC
  Administered 2014-02-22 – 2014-03-01 (×14): 150 mg via ORAL
  Filled 2014-02-22 (×18): qty 10

## 2014-02-22 MED ORDER — SODIUM CHLORIDE 0.9 % IV SOLN
INTRAVENOUS | Status: DC
  Administered 2014-02-22: 18:00:00 via INTRAVENOUS

## 2014-02-22 MED ORDER — ACETAMINOPHEN 500 MG PO TABS: 500.0000 mg | ORAL_TABLET | Freq: Four times a day (QID) | ORAL | Status: DC | PRN

## 2014-02-22 NOTE — Progress Notes (Signed)
Gave report to 4n RN regarding transfer and updated patient and family

## 2014-02-22 NOTE — Progress Notes (Signed)
[place] TEAM 1 - Stepdown/ICU TEAM Progress Note  KAELEN BRENNAN ELF:[id number] DOB: 03-31-1951 DOA: 02/20/2014 PCP: [name], MD  Admit HPI / Brief Narrative: 63 y.o. female w/ hx of locally advanced breast CA undergoing treatment for the past year. She presented with 3 day h/o dizziness. Symptoms onset was abrupt and had been persistent and unchanged. The dizziness was associated with nausea. She was unable to ambulate without someone helping her.  HPI/Subjective: Feels better today.  Still not steady on her feet, but reports that she no longer has dizziness when sitting still.  Denies cp, n/v, sob, or abdom pain.    Assessment/Plan:  R Cerebellar mass with vasogenic edema Cont decadron - for surgical resection Tuesday   Obstructive hydrocephalus Due to above - cont IV decadron  Renal failure - mild - acute  GFR ~55 - baseline crt appears to be <1.0 w/ GFR >90, w recent decline in fxn over last year - hydrate - avoid nephrotoxins/stop ACE and diuretic - follow  Breast cancer Locally advanced right breast cancer, status post neoadjuvant treatment with TCHP x6 followed by right modified radical mastectomy and left simple mastectomy, having completed 12 cycles of pertuzumab and trastuzumab intravenously every 3 weeks on 11/05/2013. having completed radiotherapy 08/29/2013 and continuing on oral anastrozole 1 mg daily. - followed by Dr. Barnet Glasgow at [facility] -   Residual peripheral neuropathy Cont home tx regimen   Right upper extremity lymphedema  Code Status: FULL Family Communication: spoke w/ pt and daughter at bedside  Disposition Plan: transfer to neuro bed - follow renal fxn   Consultants: Neurology Neurosurgery  Procedures: none  Antibiotics: none  DVT prophylaxis: SQ heparin   Objective: Blood pressure 120/95, pulse 81, temperature 98.4 F (36.9 C), temperature source Oral, resp. rate 20, height 5\' 8"  (1.727 m), weight 90.9 kg (200 lb 6.4 oz),  SpO2 97.00%.  Intake/Output Summary (Last 24 hours) at 02/22/14 1458 Last data filed at 02/22/14 1005  Gross per 24 hour  Intake    243 ml  Output      0 ml  Net    243 ml   Exam: General: No acute respiratory distress Lungs: Clear to auscultation bilaterally without wheezes or crackles Cardiovascular: Regular rate and rhythm without murmur gallop or rub normal S1 and S2 Abdomen: Nontender, nondistended, soft, bowel sounds positive, no rebound, no ascites, no appreciable mass Extremities: No significant cyanosis, clubbing, or edema bilateral lower extremities  Data Reviewed: Basic Metabolic Panel:  Recent Labs Lab 02/20/14 1659 02/21/14 0315  NA 140 141  K 4.0 4.7  CL 99 101  CO2 22 23  GLUCOSE 126* 151*  BUN 24* 21  CREATININE 1.26* 1.14*  CALCIUM 10.0 10.3   Liver Function Tests:  Recent Labs Lab 02/20/14 1659  AST 17  ALT 18  ALKPHOS 80  BILITOT 0.2*  PROT 7.3  ALBUMIN 4.2   CBC:  Recent Labs Lab 02/20/14 1659 02/21/14 0315  WBC 10.3 9.8  NEUTROABS 7.6  --   HGB 13.9 13.2  HCT 39.8 39.1  MCV 94.8 94.9  PLT 244 221   CBG: No results found for this basename: GLUCAP,  in the last 168 hours  Recent Results (from the past 240 hour(s))  MRSA PCR SCREENING     Status: None   Collection Time    02/21/14  7:24 AM      Result Value Ref Range Status   MRSA by PCR NEGATIVE  NEGATIVE Final  Comment:            The GeneXpert MRSA Assay (FDA     approved for NASAL specimens     only), is one component of a     comprehensive MRSA colonization     surveillance program. It is not     intended to diagnose MRSA     infection nor to guide or     monitor treatment for     MRSA infections.    Studies:  Recent x-ray studies have been reviewed in detail by the Attending Physician  Scheduled Meds:  Scheduled Meds: . anastrozole  1 mg Oral Daily  . dexamethasone  4 mg Intravenous 4 times per day  . gabapentin  300 mg Oral BID  . heparin  5,000 Units  Subcutaneous 3 times per day  . lisinopril  20 mg Oral Daily   And  . hydrochlorothiazide  25 mg Oral Daily  . lisinopril  20 mg Oral Daily  . magnesium oxide  400 mg Oral TID  . potassium chloride SA  40 mEq Oral TID  . ranitidine  150 mg Oral BID  . simvastatin  40 mg Oral q1800  . sodium chloride  3 mL Intravenous Q12H   Time spent on care of this patient: 35 mins  Reniah Cottingham T , MD   [facility] Office  [phone] Pager - Text Page per Shea Evans as per below:  On-Call/Text Page:      [email or web address]      password [email or web address]  If 7PM-7AM, please contact night-coverage [email or web address] Password [email or web address] 02/22/2014, 2:58 PM   LOS: 2 days

## 2014-02-22 NOTE — Progress Notes (Signed)
Pt seen and examined. No issues overnight. Pt says HA is improved. No new c/o.  EXAM: Temp:  [97.4 F (36.3 C)-98.1 F (36.7 C)] 98.1 F (36.7 C) (06/12 0700) Pulse Rate:  [77-90] 81 (06/12 0422) Resp:  [14-23] 20 (06/12 0422) BP: (107-123)/(56-95) 120/95 mmHg (06/12 0947) SpO2:  [89 %-98 %] 97 % (06/12 0422) Intake/Output     06/11 0701 - 06/12 0700 06/12 0701 - 06/13 0700   P.O.  240   I.V. (mL/kg)  3 (0)   Total Intake(mL/kg)  243 (2.7)   Net   +243        Urine Occurrence 2 x     Awake, alert, oriented Speech fluent CN grossly intact Good strength RUE/RLE ataxia  LABS: Lab Results  Component Value Date   CREATININE 1.14* 02/21/2014   BUN 21 02/21/2014   NA 141 02/21/2014   K 4.7 02/21/2014   CL 101 02/21/2014   CO2 23 02/21/2014   Lab Results  Component Value Date   WBC 9.8 02/21/2014   HGB 13.2 02/21/2014   HCT 39.1 02/21/2014   MCV 94.9 02/21/2014   PLT 221 02/21/2014    IMPRESSION: - 63 y.o. 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 Expected postoperative course was discussed. I also reviewed the risks of surgery including but not limited to worsening imbalance/coordination, new weakness/paralysis, cranial neuropathy including hearing loss, facial weakness, swallowing difficulty, CSF leak, bleeding and infection. She understood our discussion and all questions were answered. She is willing to proceed with surgery.

## 2014-02-23 LAB — BASIC METABOLIC PANEL
BUN: 37 mg/dL — ABNORMAL HIGH (ref 6–23)
CHLORIDE: 100 meq/L (ref 96–112)
CO2: 21 meq/L (ref 19–32)
CREATININE: 1.22 mg/dL — AB (ref 0.50–1.10)
Calcium: 9.5 mg/dL (ref 8.4–10.5)
GFR calc Af Amer: 53 mL/min — ABNORMAL LOW (ref >90)
GFR calc non Af Amer: 46 mL/min — ABNORMAL LOW (ref >90)
Glucose, Bld: 135 mg/dL — ABNORMAL HIGH (ref 70–99)
POTASSIUM: 4.8 meq/L (ref 3.7–5.3)
SODIUM: 138 meq/L (ref 137–147)

## 2014-02-23 MED ORDER — POTASSIUM CHLORIDE CRYS ER 20 MEQ PO TBCR
20.0000 meq | EXTENDED_RELEASE_TABLET | Freq: Every day | ORAL | Status: DC
  Administered 2014-02-24 – 2014-03-01 (×5): 20 meq via ORAL
  Filled 2014-02-23 (×6): qty 1

## 2014-02-23 NOTE — Progress Notes (Addendum)
[facility] PROGRESS NOTE  Erika Nunez WJX:[id number] DOB: 1951-06-27 DOA: 02/20/2014 PCP: [name], MD  Brief Narrative:  63 y.o. Female with PMH of HTN, HPL, of locally advanced breast CA undergoing treatment for the past year. She presented with 3 day h/o dizziness. Symptoms onset was abrupt and had been persistent and unchanged found to have brain mass   Assessment/Plan:  1. R Cerebellar mass with vasogenic edema; MRI :Solitary right posterior fossa enhancing mass, 38 x 47 x 36 mm. Meningioma is favored over solitary metastasis. -Cont decadron - for surgical resection Tuesday; no new symptoms, dizziness resolved   2. Obstructive hydrocephalus; MRI: Severe cerebellar edema and posterior fossa mass effect with a  degree of fourth ventricular obstruction and early ventriculomegaly. Partially effaced basilar cisterns -Due to above - cont IV decadron, pend OR per neurosurgery  3. Renal failure - mild - acute  -GFR ~55 - baseline crt appears to be <1.0 w/ GFR >90, w recent decline in fxn over last year - hydrate - avoid nephrotoxins/stop ACE and diuretic - follow  4. Breast cancer advanced  -Locally advanced right breast cancer, status post neoadjuvant treatment with TCHP x6 followed by right modified radical mastectomy and left simple mastectomy, having completed 12 cycles of pertuzumab and trastuzumab intravenously every 3 weeks on 11/05/2013. having completed radiotherapy 08/29/2013 and continuing on oral anastrozole 1 mg daily. - followed by Dr. Barnet Glasgow at [facility]  5. Residual peripheral neuropathy  -Cont home tx regimen  6. Right upper extremity lymphedema 7. HTN, BP is stable off meds; monitor -decrease  potassium, ? Taking too much; recheck in AM   Code Status: full  Family Communication: d/w patient (indicate person spoken with, relationship, and if by phone, the number) Disposition Plan: pend clinical improvement    Consultants:  Neurosurgery    Procedures:  none  Antibiotics:  none (indicate start date, and stop date if known)  HPI/Subjective: alert  Objective: Filed Vitals:   02/23/14 0939  BP: 114/49  Pulse: 69  Temp: 98 F (36.7 C)  Resp: 20   No intake or output data in the 24 hours ending 02/23/14 1114 Filed Weights   02/20/14 1657 02/20/14 2231  Weight: 90.719 kg (200 lb) 90.9 kg (200 lb 6.4 oz)    Exam:   General:  alert  Cardiovascular: s1,s2 rrr  Respiratory: CTA BL  Abdomen: soft, nt,nd   Musculoskeletal: no LE edema   Data Reviewed: Basic Metabolic Panel:  Recent Labs Lab 02/20/14 1659 02/21/14 0315 02/23/14 0438  NA 140 141 138  K 4.0 4.7 4.8  CL 99 101 100  CO2 22 23 21   GLUCOSE 126* 151* 135*  BUN 24* 21 37*  CREATININE 1.26* 1.14* 1.22*  CALCIUM 10.0 10.3 9.5   Liver Function Tests:  Recent Labs Lab 02/20/14 1659  AST 17  ALT 18  ALKPHOS 80  BILITOT 0.2*  PROT 7.3  ALBUMIN 4.2   No results found for this basename: LIPASE, AMYLASE,  in the last 168 hours No results found for this basename: AMMONIA,  in the last 168 hours CBC:  Recent Labs Lab 02/20/14 1659 02/21/14 0315  WBC 10.3 9.8  NEUTROABS 7.6  --   HGB 13.9 13.2  HCT 39.8 39.1  MCV 94.8 94.9  PLT 244 221   Cardiac Enzymes: No results found for this basename: CKTOTAL, CKMB, CKMBINDEX, TROPONINI,  in the last 168 hours BNP (last 3 results) No results found for this basename: PROBNP,  in the last 8760 hours CBG: No results found for this basename: GLUCAP,  in the last 168 hours  Recent Results (from the past 240 hour(s))  MRSA PCR SCREENING     Status: None   Collection Time    02/21/14  7:24 AM      Result Value Ref Range Status   MRSA by PCR NEGATIVE  NEGATIVE Final   Comment:            The GeneXpert MRSA Assay (FDA     approved for NASAL specimens     only), is one component of a     comprehensive MRSA colonization     surveillance program. It is not     intended to diagnose MRSA      infection nor to guide or     monitor treatment for     MRSA infections.     Studies: No results found.  Scheduled Meds: . anastrozole  1 mg Oral Daily  . dexamethasone  4 mg Intravenous 4 times per day  . gabapentin  300 mg Oral BID  . heparin  5,000 Units Subcutaneous 3 times per day  . magnesium oxide  400 mg Oral TID  . potassium chloride SA  40 mEq Oral TID  . ranitidine  150 mg Oral BID  . simvastatin  40 mg Oral q1800  . sodium chloride  3 mL Intravenous Q12H   Continuous Infusions: . sodium chloride 50 mL/hr at 02/22/14 1808    Principal Problem:   Cerebellar mass Active Problems:   Breast cancer   Brain mass   Obstructive hydrocephalus   Vasogenic brain edema    Time spent: >35 minutes     [name]  [facility] Pager [phone]. If 7PM-7AM, please contact night-coverage at [email or web address], password [facility] 02/23/2014, 11:14 AM  LOS: 3 days

## 2014-02-23 NOTE — Progress Notes (Signed)
Patient ID: Erika Nunez, female   DOB: [birth year], 63 y.o.   MRN: [id number] Afeb, vss No new neuro issues. Just waiting for her surgery this coming Tuesday by Dr Kathyrn Sheriff

## 2014-02-24 LAB — BASIC METABOLIC PANEL
BUN: 35 mg/dL — ABNORMAL HIGH (ref 6–23)
CALCIUM: 10 mg/dL (ref 8.4–10.5)
CHLORIDE: 100 meq/L (ref 96–112)
CO2: 21 mEq/L (ref 19–32)
Creatinine, Ser: 1.17 mg/dL — ABNORMAL HIGH (ref 0.50–1.10)
GFR calc Af Amer: 56 mL/min — ABNORMAL LOW (ref >90)
GFR calc non Af Amer: 49 mL/min — ABNORMAL LOW (ref >90)
GLUCOSE: 126 mg/dL — AB (ref 70–99)
Potassium: 4.7 mEq/L (ref 3.7–5.3)
SODIUM: 140 meq/L (ref 137–147)

## 2014-02-24 MED ORDER — SODIUM CHLORIDE 0.9 % IJ SOLN
10.0000 mL | INTRAMUSCULAR | Status: DC | PRN
  Administered 2014-02-24: 20 mL
  Administered 2014-02-27: 10 mL

## 2014-02-24 NOTE — Progress Notes (Signed)
[facility] PROGRESS NOTE  MALESSA Nunez ZOX:[id number] DOB: 08-01-1951 DOA: 02/20/2014 PCP: [name], MD  Brief Narrative:  63 y.o. Female with PMH of HTN, HPL, of locally advanced breast CA undergoing treatment for the past year. She presented with 3 day h/o dizziness. Symptoms onset was abrupt and had been persistent and unchanged found to have brain mass   Assessment/Plan:  1. R Cerebellar mass with vasogenic edema; MRI :Solitary right posterior fossa enhancing mass, 38 x 47 x 36 mm. Meningioma is favored over solitary metastasis. -Cont decadron - for surgical resection Tuesday; no new symptoms, dizziness resolved   2. Obstructive hydrocephalus; MRI: Severe cerebellar edema and posterior fossa mass effect with a  degree of fourth ventricular obstruction and early ventriculomegaly. Partially effaced basilar cisterns -Due to above - cont IV decadron, pend OR per neurosurgery  3. Renal failure - mild - acute  -GFR ~55 - baseline crt appears to be <1.0 w/ GFR >90, w recent decline in fxn over last year - hydrate - avoid nephrotoxins/stop ACE and diuretic - follow  4. Breast cancer advanced  -Locally advanced right breast cancer, status post neoadjuvant treatment with TCHP x6 followed by right modified radical mastectomy and left simple mastectomy, having completed 12 cycles of pertuzumab and trastuzumab intravenously every 3 weeks on 11/05/2013. having completed radiotherapy 08/29/2013 and continuing on oral anastrozole 1 mg daily. - followed by Dr. Barnet Glasgow at [facility]  5. Residual peripheral neuropathy  -Cont home tx regimen  6. Right upper extremity lymphedema 7. HTN, BP is stable off meds; monitor -decreased  potassium, ? Taking too much; recheck: stable   Code Status: full  Family Communication: d/w patient, updated her son (indicate person spoken with, relationship, and if by phone, the number) Disposition Plan: pend clinical improvement     Consultants:  Neurosurgery   Procedures:  none  Antibiotics:  none (indicate start date, and stop date if known)  HPI/Subjective: alert  Objective: Filed Vitals:   02/24/14 0513  BP: 98/63  Pulse: 69  Temp: 97.9 F (36.6 C)  Resp: 20   No intake or output data in the 24 hours ending 02/24/14 0904 Filed Weights   02/20/14 1657 02/20/14 2231  Weight: 90.719 kg (200 lb) 90.9 kg (200 lb 6.4 oz)    Exam:   General:  alert  Cardiovascular: s1,s2 rrr  Respiratory: CTA BL  Abdomen: soft, nt,nd   Musculoskeletal: no LE edema   Data Reviewed: Basic Metabolic Panel:  Recent Labs Lab 02/20/14 1659 02/21/14 0315 02/23/14 0438 02/24/14 0448  NA 140 141 138 140  K 4.0 4.7 4.8 4.7  CL 99 101 100 100  CO2 22 23 21 21   GLUCOSE 126* 151* 135* 126*  BUN 24* 21 37* 35*  CREATININE 1.26* 1.14* 1.22* 1.17*  CALCIUM 10.0 10.3 9.5 10.0   Liver Function Tests:  Recent Labs Lab 02/20/14 1659  AST 17  ALT 18  ALKPHOS 80  BILITOT 0.2*  PROT 7.3  ALBUMIN 4.2   No results found for this basename: LIPASE, AMYLASE,  in the last 168 hours No results found for this basename: AMMONIA,  in the last 168 hours CBC:  Recent Labs Lab 02/20/14 1659 02/21/14 0315  WBC 10.3 9.8  NEUTROABS 7.6  --   HGB 13.9 13.2  HCT 39.8 39.1  MCV 94.8 94.9  PLT 244 221   Cardiac Enzymes: No results found for this basename: CKTOTAL, CKMB, CKMBINDEX, TROPONINI,  in the last 168 hours  BNP (last 3 results) No results found for this basename: PROBNP,  in the last 8760 hours CBG: No results found for this basename: GLUCAP,  in the last 168 hours  Recent Results (from the past 240 hour(s))  MRSA PCR SCREENING     Status: None   Collection Time    02/21/14  7:24 AM      Result Value Ref Range Status   MRSA by PCR NEGATIVE  NEGATIVE Final   Comment:            The GeneXpert MRSA Assay (FDA     approved for NASAL specimens     only), is one component of a     comprehensive  MRSA colonization     surveillance program. It is not     intended to diagnose MRSA     infection nor to guide or     monitor treatment for     MRSA infections.     Studies: No results found.  Scheduled Meds: . anastrozole  1 mg Oral Daily  . dexamethasone  4 mg Intravenous 4 times per day  . gabapentin  300 mg Oral BID  . heparin  5,000 Units Subcutaneous 3 times per day  . magnesium oxide  400 mg Oral TID  . potassium chloride  20 mEq Oral Daily  . ranitidine  150 mg Oral BID  . simvastatin  40 mg Oral q1800  . sodium chloride  3 mL Intravenous Q12H   Continuous Infusions:    Principal Problem:   Cerebellar mass Active Problems:   Breast cancer   Brain mass   Obstructive hydrocephalus   Vasogenic brain edema    Time spent: >35 minutes     Erika Nunez  [facility] Pager [phone]. If 7PM-7AM, please contact night-coverage at [email or web address], password [facility] 02/24/2014, 9:04 AM  LOS: 4 days

## 2014-02-24 NOTE — Progress Notes (Signed)
Overall stable. No new issues. Minimal headache. Patient without new questions or concerns.  Examination stable.  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 Plan surgical resection Tuesday.

## 2014-02-25 NOTE — Progress Notes (Signed)
[facility] PROGRESS NOTE  Erika Nunez NOM:[id number] DOB: 05/29/1951 DOA: 02/20/2014 PCP: [name], MD  Brief Narrative:  63 y.o. Female with PMH of HTN, HPL, of locally advanced breast CA undergoing treatment for the past year. She presented with 3 day h/o dizziness. Symptoms onset was abrupt and had been persistent and unchanged found to have brain mass   Assessment/Plan:  1. R Cerebellar mass with vasogenic edema; MRI :Solitary right posterior fossa enhancing mass, 38 x 47 x 36 mm. Meningioma is favored over solitary metastasis. -Cont decadron - for surgical resection Tuesday; no new symptoms, dizziness resolved   2. Obstructive hydrocephalus; MRI: Severe cerebellar edema and posterior fossa mass effect with a  degree of fourth ventricular obstruction and early ventriculomegaly. Partially effaced basilar cisterns -Due to above - cont IV decadron, pend OR per neurosurgery  3. Renal failure - mild - acute  -GFR ~55 - baseline crt appears to be <1.0 w/ GFR >90, w recent decline in fxn over last year - hydrate - avoid nephrotoxins/stop ACE and diuretic - follow  4. Breast cancer advanced  -Locally advanced right breast cancer, status post neoadjuvant treatment with TCHP x6 followed by right modified radical mastectomy and left simple mastectomy, having completed 12 cycles of pertuzumab and trastuzumab intravenously every 3 weeks on 11/05/2013. having completed radiotherapy 08/29/2013 and continuing on oral anastrozole 1 mg daily. - followed by Dr. Barnet Glasgow at [facility]  5. Residual peripheral neuropathy  -Cont home tx regimen  6. Right upper extremity lymphedema 7. HTN, BP is stable off meds; monitor -decreased  potassium, ? Taking too much; recheck: stable   Code Status: full  Family Communication: d/w patient, updated her son (indicate person spoken with, relationship, and if by phone, the number) Disposition Plan: pend clinical improvement, surgery      Consultants:  Neurosurgery   Procedures:  none  Antibiotics:  none (indicate start date, and stop date if known)  HPI/Subjective: alert  Objective: Filed Vitals:   02/25/14 0952  BP: 124/57  Pulse: 66  Temp: 98.1 F (36.7 C)  Resp: 20    Intake/Output Summary (Last 24 hours) at 02/25/14 0959 Last data filed at 02/24/14 2113  Gross per 24 hour  Intake    483 ml  Output      0 ml  Net    483 ml   Filed Weights   02/20/14 1657 02/20/14 2231  Weight: 90.719 kg (200 lb) 90.9 kg (200 lb 6.4 oz)    Exam:   General:  alert  Cardiovascular: s1,s2 rrr  Respiratory: CTA BL  Abdomen: soft, nt,nd   Musculoskeletal: no LE edema   Data Reviewed: Basic Metabolic Panel:  Recent Labs Lab 02/20/14 1659 02/21/14 0315 02/23/14 0438 02/24/14 0448  NA 140 141 138 140  K 4.0 4.7 4.8 4.7  CL 99 101 100 100  CO2 22 23 21 21   GLUCOSE 126* 151* 135* 126*  BUN 24* 21 37* 35*  CREATININE 1.26* 1.14* 1.22* 1.17*  CALCIUM 10.0 10.3 9.5 10.0   Liver Function Tests:  Recent Labs Lab 02/20/14 1659  AST 17  ALT 18  ALKPHOS 80  BILITOT 0.2*  PROT 7.3  ALBUMIN 4.2   No results found for this basename: LIPASE, AMYLASE,  in the last 168 hours No results found for this basename: AMMONIA,  in the last 168 hours CBC:  Recent Labs Lab 02/20/14 1659 02/21/14 0315  WBC 10.3 9.8  NEUTROABS 7.6  --  HGB 13.9 13.2  HCT 39.8 39.1  MCV 94.8 94.9  PLT 244 221   Cardiac Enzymes: No results found for this basename: CKTOTAL, CKMB, CKMBINDEX, TROPONINI,  in the last 168 hours BNP (last 3 results) No results found for this basename: PROBNP,  in the last 8760 hours CBG: No results found for this basename: GLUCAP,  in the last 168 hours  Recent Results (from the past 240 hour(s))  MRSA PCR SCREENING     Status: None   Collection Time    02/21/14  7:24 AM      Result Value Ref Range Status   MRSA by PCR NEGATIVE  NEGATIVE Final   Comment:            The  GeneXpert MRSA Assay (FDA     approved for NASAL specimens     only), is one component of a     comprehensive MRSA colonization     surveillance program. It is not     intended to diagnose MRSA     infection nor to guide or     monitor treatment for     MRSA infections.     Studies: No results found.  Scheduled Meds: . anastrozole  1 mg Oral Daily  . dexamethasone  4 mg Intravenous 4 times per day  . gabapentin  300 mg Oral BID  . heparin  5,000 Units Subcutaneous 3 times per day  . magnesium oxide  400 mg Oral TID  . potassium chloride  20 mEq Oral Daily  . ranitidine  150 mg Oral BID  . simvastatin  40 mg Oral q1800  . sodium chloride  3 mL Intravenous Q12H   Continuous Infusions:    Principal Problem:   Cerebellar mass Active Problems:   Breast cancer   Brain mass   Obstructive hydrocephalus   Vasogenic brain edema    Time spent: >35 minutes     [name]  [facility] Pager [phone]. If 7PM-7AM, please contact night-coverage at [email or web address], password [facility] 02/25/2014, 9:59 AM  LOS: 5 days

## 2014-02-25 NOTE — Progress Notes (Signed)
No issues overnight. Pt doing well, no new c/o.  EXAM:  BP 123/59  Pulse 73  Temp(Src) 99.1 F (37.3 C) (Oral)  Resp 20  Ht 5' 8"  (1.727 m)  Wt 90.9 kg (200 lb 6.4 oz)  BMI 30.48 kg/m2  SpO2 98%  Awake, alert, oriented  Speech fluent, appropriate  CN grossly intact  5/5 BUE/BLE   IMPRESSION:  63 y.o. female with large right cerebellar mass, meningioma vs met - Neurologically stable  PLAN: - OR tomorrow for resection - NPO p MN - cont decadron

## 2014-02-26 ENCOUNTER — Inpatient Hospital Stay (HOSPITAL_COMMUNITY): Payer: Medicaid Other

## 2014-02-26 ENCOUNTER — Encounter (HOSPITAL_COMMUNITY): Payer: Self-pay | Admitting: Certified Registered Nurse Anesthetist

## 2014-02-26 ENCOUNTER — Encounter (HOSPITAL_COMMUNITY): Payer: Medicaid Other | Admitting: Certified Registered Nurse Anesthetist

## 2014-02-26 ENCOUNTER — Inpatient Hospital Stay (HOSPITAL_COMMUNITY): Payer: Medicaid Other | Admitting: Certified Registered Nurse Anesthetist

## 2014-02-26 ENCOUNTER — Encounter (HOSPITAL_COMMUNITY)
Admission: EM | Disposition: A | Discharge status: Home / Self Care | Payer: Self-pay | Source: Home / Self Care | Attending: Neurosurgery

## 2014-02-26 DIAGNOSIS — R519 Headache, unspecified: Secondary | ICD-10-CM | POA: Diagnosis present

## 2014-02-26 DIAGNOSIS — C7931 Secondary malignant neoplasm of brain: Secondary | ICD-10-CM | POA: Diagnosis present

## 2014-02-26 DIAGNOSIS — R51 Headache: Secondary | ICD-10-CM

## 2014-02-26 HISTORY — PX: CRANIOTOMY: SHX93

## 2014-02-26 LAB — TYPE AND SCREEN
ABO/RH(D): A POS
Antibody Screen: NEGATIVE

## 2014-02-26 LAB — ABO/RH: ABO/RH(D): A POS

## 2014-02-26 LAB — SURGICAL PCR SCREEN
MRSA, PCR: NEGATIVE
Staphylococcus aureus: POSITIVE — AB

## 2014-02-26 SURGERY — CRANIOTOMY TUMOR EXCISION
Anesthesia: General | Site: Head | Laterality: Right

## 2014-02-26 MED ORDER — MICROFIBRILLAR COLL HEMOSTAT EX PADS
MEDICATED_PAD | CUTANEOUS | Status: DC | PRN
  Administered 2014-02-26: 1 via TOPICAL

## 2014-02-26 MED ORDER — THROMBIN 5000 UNITS EX SOLR
OROMUCOSAL | Status: DC | PRN
  Administered 2014-02-26 (×2): via TOPICAL

## 2014-02-26 MED ORDER — MORPHINE SULFATE 2 MG/ML IJ SOLN
1.0000 mg | INTRAMUSCULAR | Status: DC | PRN
  Administered 2014-02-26: 1 mg via INTRAVENOUS
  Administered 2014-02-26 – 2014-02-27 (×3): 2 mg via INTRAVENOUS
  Filled 2014-02-26 (×4): qty 1

## 2014-02-26 MED ORDER — MIDAZOLAM HCL 2 MG/2ML IJ SOLN
INTRAMUSCULAR | Status: AC
  Filled 2014-02-26: qty 2

## 2014-02-26 MED ORDER — SODIUM CHLORIDE 0.9 % IV SOLN
INTRAVENOUS | Status: DC | PRN
  Administered 2014-02-26 (×2): via INTRAVENOUS

## 2014-02-26 MED ORDER — FENTANYL CITRATE 0.05 MG/ML IJ SOLN: 25.0000 ug | INTRAMUSCULAR | Status: DC | PRN

## 2014-02-26 MED ORDER — LABETALOL HCL 5 MG/ML IV SOLN
10.0000 mg | INTRAVENOUS | Status: DC | PRN
  Administered 2014-02-26: 20 mg via INTRAVENOUS
  Filled 2014-02-26: qty 4

## 2014-02-26 MED ORDER — DOCUSATE SODIUM 100 MG PO CAPS
100.0000 mg | ORAL_CAPSULE | Freq: Two times a day (BID) | ORAL | Status: DC
  Administered 2014-02-26 – 2014-03-01 (×6): 100 mg via ORAL
  Filled 2014-02-26 (×7): qty 1

## 2014-02-26 MED ORDER — FENTANYL CITRATE 0.05 MG/ML IJ SOLN
INTRAMUSCULAR | Status: AC
Start: 2014-02-26 — End: 2014-02-26
  Filled 2014-02-26: qty 5

## 2014-02-26 MED ORDER — HEPARIN SODIUM (PORCINE) 5000 UNIT/ML IJ SOLN
5000.0000 [IU] | Freq: Three times a day (TID) | INTRAMUSCULAR | Status: DC
  Administered 2014-02-27 – 2014-03-01 (×8): 5000 [IU] via SUBCUTANEOUS
  Filled 2014-02-26 (×10): qty 1

## 2014-02-26 MED ORDER — GADOBENATE DIMEGLUMINE 529 MG/ML IV SOLN
19.0000 mL | Freq: Once | INTRAVENOUS | Status: AC | PRN
  Administered 2014-02-26: 19 mL via INTRAVENOUS

## 2014-02-26 MED ORDER — ONDANSETRON HCL 4 MG/2ML IJ SOLN
INTRAMUSCULAR | Status: AC
  Filled 2014-02-26: qty 2

## 2014-02-26 MED ORDER — GLYCOPYRROLATE 0.2 MG/ML IJ SOLN
INTRAMUSCULAR | Status: DC | PRN
  Administered 2014-02-26: .8 mg via INTRAVENOUS
  Administered 2014-02-26: 0.2 mg via INTRAVENOUS

## 2014-02-26 MED ORDER — PROPOFOL 10 MG/ML IV BOLUS
INTRAVENOUS | Status: AC
  Filled 2014-02-26: qty 20

## 2014-02-26 MED ORDER — ROCURONIUM BROMIDE 50 MG/5ML IV SOLN
INTRAVENOUS | Status: AC
  Filled 2014-02-26: qty 1

## 2014-02-26 MED ORDER — SODIUM CHLORIDE 0.9 % IV SOLN
INTRAVENOUS | Status: DC | PRN
  Administered 2014-02-26: 08:00:00 via INTRAVENOUS

## 2014-02-26 MED ORDER — FENTANYL CITRATE 0.05 MG/ML IJ SOLN
INTRAMUSCULAR | Status: DC | PRN
  Administered 2014-02-26: 100 ug via INTRAVENOUS
  Administered 2014-02-26: 150 ug via INTRAVENOUS
  Administered 2014-02-26: 25 ug via INTRAVENOUS
  Administered 2014-02-26 (×2): 50 ug via INTRAVENOUS
  Administered 2014-02-26: 25 ug via INTRAVENOUS
  Administered 2014-02-26 (×5): 50 ug via INTRAVENOUS

## 2014-02-26 MED ORDER — LIDOCAINE HCL (CARDIAC) 20 MG/ML IV SOLN
INTRAVENOUS | Status: AC
  Filled 2014-02-26: qty 5

## 2014-02-26 MED ORDER — CEFAZOLIN SODIUM-DEXTROSE 2-3 GM-% IV SOLR
INTRAVENOUS | Status: DC | PRN
  Administered 2014-02-26: 2 g via INTRAVENOUS

## 2014-02-26 MED ORDER — BUPIVACAINE HCL (PF) 0.5 % IJ SOLN
INTRAMUSCULAR | Status: DC | PRN
  Administered 2014-02-26: 4 mL

## 2014-02-26 MED ORDER — PHENYLEPHRINE HCL 10 MG/ML IJ SOLN
INTRAMUSCULAR | Status: DC | PRN
  Administered 2014-02-26: 40 ug via INTRAVENOUS
  Administered 2014-02-26: 80 ug via INTRAVENOUS
  Administered 2014-02-26 (×3): 40 ug via INTRAVENOUS

## 2014-02-26 MED ORDER — ROCURONIUM BROMIDE 100 MG/10ML IV SOLN
INTRAVENOUS | Status: DC | PRN
  Administered 2014-02-26: 30 mg via INTRAVENOUS
  Administered 2014-02-26: 20 mg via INTRAVENOUS
  Administered 2014-02-26 (×2): 10 mg via INTRAVENOUS
  Administered 2014-02-26: 50 mg via INTRAVENOUS
  Administered 2014-02-26: 20 mg via INTRAVENOUS

## 2014-02-26 MED ORDER — LIDOCAINE HCL (CARDIAC) 20 MG/ML IV SOLN
INTRAVENOUS | Status: DC | PRN
  Administered 2014-02-26: 80 mg via INTRAVENOUS

## 2014-02-26 MED ORDER — HEPARIN SODIUM (PORCINE) 5000 UNIT/ML IJ SOLN: 5000.0000 [IU] | Freq: Three times a day (TID) | INTRAMUSCULAR | Status: DC

## 2014-02-26 MED ORDER — NEOSTIGMINE METHYLSULFATE 10 MG/10ML IV SOLN
INTRAVENOUS | Status: AC
  Filled 2014-02-26: qty 1

## 2014-02-26 MED ORDER — MANNITOL 20 % IV SOLN
INTRAVENOUS | Status: DC | PRN
  Administered 2014-02-26: 09:00:00 via INTRAVENOUS

## 2014-02-26 MED ORDER — HYDROCODONE-ACETAMINOPHEN 5-325 MG PO TABS
1.0000 | ORAL_TABLET | ORAL | Status: DC | PRN
  Administered 2014-02-26 – 2014-03-01 (×11): 1 via ORAL
  Filled 2014-02-26 (×10): qty 1

## 2014-02-26 MED ORDER — GLYCOPYRROLATE 0.2 MG/ML IJ SOLN
INTRAMUSCULAR | Status: AC
  Filled 2014-02-26: qty 1

## 2014-02-26 MED ORDER — ALBUMIN HUMAN 5 % IV SOLN
INTRAVENOUS | Status: DC | PRN
  Administered 2014-02-26: 11:00:00 via INTRAVENOUS

## 2014-02-26 MED ORDER — CEFAZOLIN SODIUM-DEXTROSE 2-3 GM-% IV SOLR
2.0000 g | Freq: Three times a day (TID) | INTRAVENOUS | Status: AC
  Administered 2014-02-26 (×2): 2 g via INTRAVENOUS
  Filled 2014-02-26 (×2): qty 50

## 2014-02-26 MED ORDER — LIDOCAINE-EPINEPHRINE 1 %-1:100000 IJ SOLN
INTRAMUSCULAR | Status: DC | PRN
  Administered 2014-02-26: 4 mL

## 2014-02-26 MED ORDER — PHENYLEPHRINE 40 MCG/ML (10ML) SYRINGE FOR IV PUSH (FOR BLOOD PRESSURE SUPPORT)
PREFILLED_SYRINGE | INTRAVENOUS | Status: AC
  Filled 2014-02-26: qty 10

## 2014-02-26 MED ORDER — SODIUM CHLORIDE 0.9 % IV SOLN
INTRAVENOUS | Status: DC
Start: 2014-02-26 — End: 2014-03-01
  Administered 2014-02-26 – 2014-03-01 (×5): via INTRAVENOUS

## 2014-02-26 MED ORDER — GLYCOPYRROLATE 0.2 MG/ML IJ SOLN
INTRAMUSCULAR | Status: AC
  Filled 2014-02-26: qty 4

## 2014-02-26 MED ORDER — THROMBIN 20000 UNITS EX SOLR
CUTANEOUS | Status: DC | PRN
  Administered 2014-02-26: 09:00:00 via TOPICAL

## 2014-02-26 MED ORDER — ARTIFICIAL TEARS OP OINT
TOPICAL_OINTMENT | OPHTHALMIC | Status: AC
  Filled 2014-02-26: qty 3.5

## 2014-02-26 MED ORDER — PROPOFOL 10 MG/ML IV BOLUS
INTRAVENOUS | Status: DC | PRN
  Administered 2014-02-26 (×5): 10 mg via INTRAVENOUS
  Administered 2014-02-26: 120 mg via INTRAVENOUS
  Administered 2014-02-26: 20 mg via INTRAVENOUS
  Administered 2014-02-26: 10 mg via INTRAVENOUS

## 2014-02-26 MED ORDER — BACITRACIN ZINC 500 UNIT/GM EX OINT
TOPICAL_OINTMENT | CUTANEOUS | Status: DC | PRN
Start: 2014-02-26 — End: 2014-02-26
  Administered 2014-02-26: 1 via TOPICAL

## 2014-02-26 MED ORDER — STERILE WATER FOR INJECTION IJ SOLN
INTRAMUSCULAR | Status: AC
  Filled 2014-02-26: qty 10

## 2014-02-26 MED ORDER — ONDANSETRON HCL 4 MG/2ML IJ SOLN
INTRAMUSCULAR | Status: DC | PRN
  Administered 2014-02-26: 4 mg via INTRAVENOUS

## 2014-02-26 MED ORDER — EPHEDRINE SULFATE 50 MG/ML IJ SOLN
INTRAMUSCULAR | Status: AC
  Filled 2014-02-26: qty 1

## 2014-02-26 MED ORDER — EPHEDRINE SULFATE 50 MG/ML IJ SOLN
INTRAMUSCULAR | Status: DC | PRN
  Administered 2014-02-26 (×3): 5 mg via INTRAVENOUS

## 2014-02-26 MED ORDER — FENTANYL CITRATE 0.05 MG/ML IJ SOLN
INTRAMUSCULAR | Status: AC
  Filled 2014-02-26: qty 5

## 2014-02-26 MED ORDER — NEOSTIGMINE METHYLSULFATE 10 MG/10ML IV SOLN
INTRAVENOUS | Status: DC | PRN
Start: 2014-02-26 — End: 2014-02-26
  Administered 2014-02-26: 5 mg via INTRAVENOUS

## 2014-02-26 MED ORDER — MIDAZOLAM HCL 5 MG/5ML IJ SOLN
INTRAMUSCULAR | Status: DC | PRN
  Administered 2014-02-26 (×2): 1 mg via INTRAVENOUS

## 2014-02-26 MED ORDER — SENNA 8.6 MG PO TABS
1.0000 | ORAL_TABLET | Freq: Two times a day (BID) | ORAL | Status: DC
  Administered 2014-02-26 – 2014-03-01 (×6): 8.6 mg via ORAL
  Filled 2014-02-26 (×8): qty 1

## 2014-02-26 MED ORDER — 0.9 % SODIUM CHLORIDE (POUR BTL) OPTIME
TOPICAL | Status: DC | PRN
  Administered 2014-02-26 (×3): 1000 mL

## 2014-02-26 SURGICAL SUPPLY — 110 items
BENZOIN TINCTURE PRP APPL 2/3 (GAUZE/BANDAGES/DRESSINGS) IMPLANT
BLADE 10 SAFETY STRL DISP (BLADE) IMPLANT
BLADE SAW GIGLI 16 STRL (MISCELLANEOUS) IMPLANT
BLADE SURG 15 STRL LF DISP TIS (BLADE) IMPLANT
BLADE SURG 15 STRL SS (BLADE)
BLADE SURG ROTATE 9660 (MISCELLANEOUS) ×3 IMPLANT
BLADE ULTRA TIP 2M (BLADE) ×3 IMPLANT
BNDG GAUZE ELAST 4 BULKY (GAUZE/BANDAGES/DRESSINGS) IMPLANT
BRUSH SCRUB EZ 1% IODOPHOR (MISCELLANEOUS) ×3 IMPLANT
BUR ACORN 6.0 PRECISION (BURR) ×2 IMPLANT
BUR ACORN 6.0MM PRECISION (BURR) ×1
BUR ADDG 1.1 (BURR) IMPLANT
BUR ADDG 1.1MM (BURR)
BUR MATCHSTICK NEURO 3.0 LAGG (BURR) IMPLANT
BUR ROUND FLUTED 5 RND (BURR) ×2 IMPLANT
BUR ROUND FLUTED 5MM RND (BURR) ×1
BUR ROUTER D-58 CRANI (BURR) IMPLANT
CANISTER SUCT 3000ML (MISCELLANEOUS) ×3 IMPLANT
CATH VENTRIC 35X38 W/TROCAR LG (CATHETERS) IMPLANT
CLIP TI MEDIUM 6 (CLIP) IMPLANT
CONT SPEC 4OZ CLIKSEAL STRL BL (MISCELLANEOUS) ×9 IMPLANT
CORDS BIPOLAR (ELECTRODE) ×3 IMPLANT
COVER MAYO STAND STRL (DRAPES) IMPLANT
DECANTER SPIKE VIAL GLASS SM (MISCELLANEOUS) ×3 IMPLANT
DERMABOND ADHESIVE PROPEN (GAUZE/BANDAGES/DRESSINGS) ×2
DERMABOND ADVANCED .7 DNX6 (GAUZE/BANDAGES/DRESSINGS) ×1 IMPLANT
DRAIN SNY WOU 7FLT (WOUND CARE) IMPLANT
DRAIN SUBARACHNOID (WOUND CARE) IMPLANT
DRAPE MICROSCOPE LEICA (MISCELLANEOUS) IMPLANT
DRAPE NEUROLOGICAL W/INCISE (DRAPES) ×3 IMPLANT
DRAPE ORTHO SPLIT 77X108 STRL (DRAPES)
DRAPE STERI IOBAN 125X83 (DRAPES) IMPLANT
DRAPE SURG 17X23 STRL (DRAPES) IMPLANT
DRAPE SURG IRRIG POUCH 19X23 (DRAPES) IMPLANT
DRAPE SURG ORHT 6 SPLT 77X108 (DRAPES) IMPLANT
DRAPE WARM FLUID 44X44 (DRAPE) ×3 IMPLANT
DRSG OPSITE POSTOP 4X6 (GAUZE/BANDAGES/DRESSINGS) ×3 IMPLANT
DRSG TELFA 3X8 NADH (GAUZE/BANDAGES/DRESSINGS) ×3 IMPLANT
DURAPREP 6ML APPLICATOR 50/CS (WOUND CARE) ×3 IMPLANT
DURASEAL APPLICATOR TIP (TIP) ×3 IMPLANT
DURASEAL SPINE SEALANT 3ML (MISCELLANEOUS) ×3 IMPLANT
ELECT CAUTERY BLADE 6.4 (BLADE) ×3 IMPLANT
ELECT REM PT RETURN 9FT ADLT (ELECTROSURGICAL) ×3
ELECTRODE REM PT RTRN 9FT ADLT (ELECTROSURGICAL) ×1 IMPLANT
EVACUATOR 1/8 PVC DRAIN (DRAIN) IMPLANT
EVACUATOR SILICONE 100CC (DRAIN) IMPLANT
FORCEPS BIPOLAR SPETZLER 8 1.0 (NEUROSURGERY SUPPLIES) ×3 IMPLANT
GAUZE SPONGE 4X4 16PLY XRAY LF (GAUZE/BANDAGES/DRESSINGS) IMPLANT
GLOVE BIO SURGEON STRL SZ8 (GLOVE) ×3 IMPLANT
GLOVE BIOGEL PI IND STRL 7.0 (GLOVE) ×2 IMPLANT
GLOVE BIOGEL PI IND STRL 7.5 (GLOVE) ×2 IMPLANT
GLOVE BIOGEL PI IND STRL 8.5 (GLOVE) ×1 IMPLANT
GLOVE BIOGEL PI INDICATOR 7.0 (GLOVE) ×4
GLOVE BIOGEL PI INDICATOR 7.5 (GLOVE) ×4
GLOVE BIOGEL PI INDICATOR 8.5 (GLOVE) ×2
GLOVE ECLIPSE 6.5 STRL STRAW (GLOVE) IMPLANT
GLOVE ECLIPSE 7.0 STRL STRAW (GLOVE) ×6 IMPLANT
GLOVE ECLIPSE 7.5 STRL STRAW (GLOVE) IMPLANT
GLOVE EXAM NITRILE LRG STRL (GLOVE) IMPLANT
GLOVE EXAM NITRILE MD LF STRL (GLOVE) IMPLANT
GLOVE EXAM NITRILE XL STR (GLOVE) IMPLANT
GLOVE EXAM NITRILE XS STR PU (GLOVE) IMPLANT
GLOVE OPTIFIT SS 6.5 STRL BRWN (GLOVE) ×12 IMPLANT
GOWN STRL REUS W/ TWL LRG LVL3 (GOWN DISPOSABLE) ×3 IMPLANT
GOWN STRL REUS W/ TWL XL LVL3 (GOWN DISPOSABLE) ×1 IMPLANT
GOWN STRL REUS W/TWL 2XL LVL3 (GOWN DISPOSABLE) IMPLANT
GOWN STRL REUS W/TWL LRG LVL3 (GOWN DISPOSABLE) ×6
GOWN STRL REUS W/TWL XL LVL3 (GOWN DISPOSABLE) ×2
GRAFT DURAGEN MATRIX 2WX2L ×6 IMPLANT
HEMOSTAT SURGICEL 2X14 (HEMOSTASIS) IMPLANT
KIT BASIN OR (CUSTOM PROCEDURE TRAY) ×3 IMPLANT
KIT DRAIN CSF ACCUDRAIN (MISCELLANEOUS) IMPLANT
KIT ROOM TURNOVER OR (KITS) ×3 IMPLANT
NEEDLE HYPO 25X1 1.5 SAFETY (NEEDLE) ×3 IMPLANT
NEEDLE SPNL 18GX3.5 QUINCKE PK (NEEDLE) IMPLANT
NS IRRIG 1000ML POUR BTL (IV SOLUTION) ×3 IMPLANT
PACK CRANIOTOMY (CUSTOM PROCEDURE TRAY) ×3 IMPLANT
PAD EYE OVAL STERILE LF (GAUZE/BANDAGES/DRESSINGS) IMPLANT
PATTIES SURGICAL .25X.25 (GAUZE/BANDAGES/DRESSINGS) IMPLANT
PATTIES SURGICAL .5 X.5 (GAUZE/BANDAGES/DRESSINGS) IMPLANT
PATTIES SURGICAL .5 X3 (DISPOSABLE) IMPLANT
PATTIES SURGICAL 1/4 X 3 (GAUZE/BANDAGES/DRESSINGS) IMPLANT
PATTIES SURGICAL 1X1 (DISPOSABLE) IMPLANT
RUBBERBAND STERILE (MISCELLANEOUS) IMPLANT
SET TUBING W/EXT DISP (INSTRUMENTS) ×3 IMPLANT
SLEEVE SURGEON STRL (DRAPES) ×3 IMPLANT
SPECIMEN JAR SMALL (MISCELLANEOUS) IMPLANT
SPONGE GAUZE 4X4 12PLY (GAUZE/BANDAGES/DRESSINGS) ×3 IMPLANT
SPONGE NEURO XRAY DETECT 1X3 (DISPOSABLE) IMPLANT
SPONGE SURGIFOAM ABS GEL 100 (HEMOSTASIS) ×3 IMPLANT
STAPLER VISISTAT 35W (STAPLE) ×3 IMPLANT
SUT ETHILON 3 0 FSL (SUTURE) IMPLANT
SUT ETHILON 3 0 PS 1 (SUTURE) IMPLANT
SUT NURALON 4 0 TR CR/8 (SUTURE) ×9 IMPLANT
SUT SILK 0 TIES 10X30 (SUTURE) IMPLANT
SUT VIC AB 0 CT1 18XCR BRD8 (SUTURE) ×4 IMPLANT
SUT VIC AB 0 CT1 8-18 (SUTURE) ×8
SUT VIC AB 3-0 SH 8-18 (SUTURE) ×3 IMPLANT
SUT VICRYL 3-0 RB1 18 ABS (SUTURE) ×9 IMPLANT
SYR 20ML ECCENTRIC (SYRINGE) ×3 IMPLANT
SYR CONTROL 10ML LL (SYRINGE) ×3 IMPLANT
TIP SONASTAR STD MISONIX 1.9 (TRAY / TRAY PROCEDURE) IMPLANT
TIP STRAIGHT 25KHZ (INSTRUMENTS) ×3 IMPLANT
TOWEL OR 17X24 6PK STRL BLUE (TOWEL DISPOSABLE) ×3 IMPLANT
TOWEL OR 17X26 10 PK STRL BLUE (TOWEL DISPOSABLE) ×3 IMPLANT
TRAY FOLEY CATH 14FRSI W/METER (CATHETERS) ×3 IMPLANT
TUBE CONNECTING 12'X1/4 (SUCTIONS) ×1
TUBE CONNECTING 12X1/4 (SUCTIONS) ×2 IMPLANT
UNDERPAD 30X30 INCONTINENT (UNDERPADS AND DIAPERS) ×3 IMPLANT
WATER STERILE IRR 1000ML POUR (IV SOLUTION) ×3 IMPLANT

## 2014-02-26 NOTE — Op Note (Signed)
PREOP DIAGNOSIS: Right cerebellar tumor  POSTOP DIAGNOSIS: Same  PROCEDURE: 1. Suboccipital craniectomy 2. Resection of tumor  3. Use of intraoperative microscope for microdissection  SURGEON: Dr. Consuella Lose, MD  ASSISTANT: Dr. Erline Levine, MD  ANESTHESIA: General Endotracheal  EBL: 300cc  SPECIMENS: Cerebellar tumor for frozen/permanent section  DRAINS: None  COMPLICATIONS: None immediate  CONDITION: Hemodynamically stable to PACU  HISTORY: Erika Nunez is a 63 y.o. female who presented to the emergency department with headache, dizziness, and several day history of stumbling while walking. CT scan demonstrated a large right-sided cerebellar lesion, confirmed on MRI. The patient does have a history of breast cancer treated with mastectomy, chemotherapy and radiation, which was completed in February. She has been maintained on a aromatase inhibitor since then. Given the size of the lesion, its location, and significant mass effect, surgical resection was indicated. The risks and benefits of the surgery were explained in detail the patient and her family. After all their questions were answered, consent was obtained.  PROCEDURE IN DETAIL: After informed consent was obtained and witnessed, the patient was brought to the operating room. After induction of general anesthesia, the Mayfield headholder was applied to the patient and was positioned on the operative table in the prone position. All pressure points were meticulously padded. Skin incision was then marked out to the right of midline and prepped and draped in the usual sterile fashion.  After timeout was conducted, local anesthetic with epinephrine was infiltrated. Skin incision was then made sharply, and Bovie electrocautery was used to dissect the subcutaneous tissue until the nuchal fascia was identified. This was then incised, and subperiosteal dissection along the occipital bone was undertaken until the midline keel  was identified medially, and the posterior border of the mastoid was identified laterally. Self-retaining retractor was then placed.  Using a high-speed drill, a right-sided suboccipital craniectomy was completed. Kerrison rongeurs were then used to extend the craniectomy superiorly and medially until the inferior border of the transverse sinus was identified. At this point, the microscope was draped sterilely and brought into the field, and the remainder of the case was done under the microscope using microdissection. The dura was opened in cruciate fashion.  Using a combination of bipolar electrocautery, and ultrasonic aspirator, the superficial cerebellum just inferior to the tentorium was coagulated and dissected and a very firm, yellowish tumor was identified. This tumor was then dissected away from the surrounding cerebellum using a combination of traction, ultrasonic aspiration initially in the inferior, lateral, and subsequently the superior margin along the tentorium. Once these margins were identified and dissected using intermittent internal debulking, a large portion of the tumor was removed. Portions were sent for frozen pathology in addition a permanent pathology which was consistent with metastatic breast cancer. Using a similar technique, the remaining medial portion of the tumor was then removed.  Tumor margins were then inspected, and appeared to be largely edematous cerebellum, without any identifiable tumor remaining. Hemostasis was then achieved using a combination of bipolar electrocautery and morcellized Gelfoam with thrombin. Once hemostasis was secured, the wound is irrigated with copious amounts of antibiotic saline.  A DuraGen inlay graft was then placed, the dural leaflets were then reapproximated using 4-0 Nurolon stitches, and a DuraGen onlay graft was placed. The craniectomy defect was then covered with DuraSeal.  At this point the wound is irrigated with copious amounts of  normal saline irrigation. Muscle was then closed using interrupted 0 Vicryl stitches, the fascia with interrupted 0 Vicry  stitches, and the subcutaneous layer with interrupted 3-0 Vicryl sutures. The skin was closed using Dermabond. Sterile dressing was then applied. The patient was then transferred to the stretcher, the Mayfield removed, and taken to the postanesthesia care unit in stable hemodynamic condition.  At the end of the case all sponge, needle, and instrument counts were correct.

## 2014-02-26 NOTE — Anesthesia Procedure Notes (Addendum)
Procedure Name: Intubation Date/Time: 02/26/2014 7:59 AM Performed by: Ollen Bowl Pre-anesthesia Checklist: Patient identified, Emergency Drugs available, Suction available, Patient being monitored and Timeout performed Patient Re-evaluated:Patient Re-evaluated prior to inductionOxygen Delivery Method: Circle system utilized Preoxygenation: Pre-oxygenation with 100% oxygen Intubation Type: IV induction Ventilation: Mask ventilation without difficulty and Oral airway inserted - appropriate to patient size Laryngoscope Size: Sabra Heck and 2 Grade View: Grade I Tube type: Subglottic suction tube Tube size: 7.5 mm Number of attempts: 1 Airway Equipment and Method: Stylet and Bite block Secured at: 23 cm Dental Injury: Teeth and Oropharynx as per pre-operative assessment     RIJ CVP Dual lumen: 2694-8546: The patient was identified and consent obtained.  TO was performed, and full barrier precautions were used.  The skin was anesthetized with lidocaine.  Once the vein was located with the 22 ga. needle using ultrasound guidance , the wire was inserted into the vein.  The wire location was confirmed with ultrasound.  The tissue was dilated and the catheter was carefully inserted, then sutured in place. A dressing was applied. The patient tolerated the procedure well.  CE

## 2014-02-26 NOTE — Anesthesia Preprocedure Evaluation (Addendum)
Anesthesia Evaluation  Patient identified by MRN, date of birth, ID band Patient awake  General Assessment Comment:History noted. CE  Reviewed: Allergy & Precautions, H&P , NPO status , Patient's Chart, lab work & pertinent test results, reviewed documented beta blocker date and time   Airway Mallampati: II TM Distance: >3 FB Neck ROM: Full    Dental  (+) Edentulous Upper, Edentulous Lower   Pulmonary neg pulmonary ROS,  breath sounds clear to auscultation        Cardiovascular hypertension, Pt. on medications Rhythm:Regular Rate:Normal     Neuro/Psych  Headaches, Anxiety Depression    GI/Hepatic negative GI ROS, Neg liver ROS,   Endo/Other    Renal/GU negative Renal ROS     Musculoskeletal   Abdominal   Peds  Hematology  (+) anemia ,   Anesthesia Other Findings   Reproductive/Obstetrics                        Anesthesia Physical Anesthesia Plan  ASA: III  Anesthesia Plan: General   Post-op Pain Management:    Induction: Intravenous  Airway Management Planned: Oral ETT  Additional Equipment: Arterial line  Intra-op Plan:   Post-operative Plan: Possible Post-op intubation/ventilation  Informed Consent: I have reviewed the patients History and Physical, chart, labs and discussed the procedure including the risks, benefits and alternatives for the proposed anesthesia with the patient or authorized representative who has indicated his/her understanding and acceptance.   Dental advisory given  Plan Discussed with: CRNA, Anesthesiologist and Surgeon  Anesthesia Plan Comments:       Anesthesia Quick Evaluation

## 2014-02-26 NOTE — Transfer of Care (Signed)
Immediate Anesthesia Transfer of Care Note  Patient: Erika Nunez  Procedure(s) Performed: Procedure(s) with comments: suboccipital craniotomy for tumor resection (Right) - suboccipital craniotomy for tumor resection  Patient Location: PACU  Anesthesia Type:General  Level of Consciousness: awake, alert , oriented and patient cooperative  Airway & Oxygen Therapy: Patient Spontanous Breathing and Patient connected to nasal cannula oxygen  Post-op Assessment: Report given to PACU RN, Post -op Vital signs reviewed and stable, Patient moving all extremities X 4 and Patient able to stick tongue midline  Post vital signs: Reviewed  Complications: No apparent anesthesia complications

## 2014-02-26 NOTE — Anesthesia Postprocedure Evaluation (Signed)
  Anesthesia Post-op Note  Patient: Erika Nunez  Procedure(s) Performed: Procedure(s) with comments: suboccipital craniotomy for tumor resection (Right) - suboccipital craniotomy for tumor resection  Patient Location: PACU  Anesthesia Type:General  Level of Consciousness: awake  Airway and Oxygen Therapy: Patient Spontanous Breathing  Post-op Pain: mild  Post-op Assessment: Post-op Vital signs reviewed  Post-op Vital Signs: Reviewed  Last Vitals:  Filed Vitals:   02/26/14 1231  BP:   Pulse:   Temp: 37 C  Resp:     Complications: No apparent anesthesia complications

## 2014-02-26 NOTE — OR Nursing (Signed)
Upon assessment of the patient in the OR holding area, it was found that the patient's consent for surgery form was signed by patient, but no witness signed the form. The patient's signature was dated 02/25/14 at 5:55 pm. I showed the consent form to the patient and confirmed that she understood the consent form, the procedure listed on it, and that it was her signature that was present on it. I signed in the witness section of the consent form, noting that "patient confirmed her signature is present above." The time was 0706 on 02/26/14.

## 2014-02-26 NOTE — Progress Notes (Signed)
No issues overnight. Pt seen in Preop, nervous about surgery but ready to proceed. No complaints.  EXAM:  BP 124/78  Pulse 70  Temp(Src) 98.6 F (37 C) (Oral)  Resp 18  Ht _0  (1.727 m)  Wt 90.9 kg (200 lb 6.4 oz)  BMI 30.48 kg/m2  SpO2 98%  Awake, alert, oriented  Speech fluent, appropriate  CN grossly intact  5/5 BUE/BLE   IMPRESSION:  63 y.o. female with hx of breast CA and large cerebellar tumor, meningioma vs met - Neurologically intact x right ataxia  PLAN: - Suboccipital crani for resection of tumor - ICU postop - Cont decadrom - Periop abx

## 2014-02-26 NOTE — Progress Notes (Signed)
[facility] PROGRESS NOTE  Erika Nunez XBJ:[id number] DOB: 1950/10/06 DOA: 02/20/2014 PCP: [name], MD  Brief Narrative:  63 y.o. Female with PMH of HTN, HPL, of locally advanced breast CA undergoing treatment for the past year. She presented with 3 day h/o dizziness. Symptoms onset was abrupt and had been persistent and unchanged found to have brain mass s/p surgery on 6/16  Assessment/Plan:  1. R Cerebellar mass with vasogenic edema; MRI :Solitary right posterior fossa enhancing mass, 38 x 47 x 36 mm. Meningioma is favored over solitary metastasis. -6/16: s/p Suboccipital craniectomy Resection of tumor Use of intraoperative microscope for microdissection -cont management per neurosurgery; no new symptoms, dizziness resolved   2. Obstructive hydrocephalus; MRI: Severe cerebellar edema and posterior fossa mass effect with a  degree of fourth ventricular obstruction and early ventriculomegaly. Partially effaced basilar cisterns -Due to above, per neurosurgery  3. Renal failure - mild - acute  -GFR ~55 - baseline crt appears to be <1.0 w/ GFR >90, w recent decline in fxn over last year - hydrate - avoid nephrotoxins/stop ACE and diuretic - follow  4. Breast cancer advanced  -Locally advanced right breast cancer, status post neoadjuvant treatment with TCHP x6 followed by right modified radical mastectomy and left simple mastectomy, having completed 12 cycles of pertuzumab and trastuzumab intravenously every 3 weeks on 11/05/2013. having completed radiotherapy 08/29/2013 and continuing on oral anastrozole 1 mg daily. - followed by Dr. Barnet Glasgow [facility]  5. Residual peripheral neuropathy  -Cont home tx regimen  6. Right upper extremity lymphedema 7. HTN, BP is stable off meds; monitor -decreased  potassium, ? Taking too much; recheck: stable     Code Status: full  Family Communication: d/w patient, updated her son (indicate person spoken with, relationship, and if by  phone, the number) Disposition Plan: pend clinical improvement, surgery     Consultants:  Neurosurgery   Procedures:  none  Antibiotics:  none (indicate start date, and stop date if known)  HPI/Subjective: alert  Objective: Filed Vitals:   02/26/14 1530  BP: 164/64  Pulse: 65  Temp: 97.9 F (36.6 C)  Resp: 14    Intake/Output Summary (Last 24 hours) at 02/26/14 1638 Last data filed at 02/26/14 1522  Gross per 24 hour  Intake   2675 ml  Output   1275 ml  Net   1400 ml   Filed Weights   02/20/14 1657 02/20/14 2231 02/26/14 1500  Weight: 90.719 kg (200 lb) 90.9 kg (200 lb 6.4 oz) 91.8 kg (202 lb 6.1 oz)    Exam:   General:  alert  Cardiovascular: s1,s2 rrr  Respiratory: CTA BL  Abdomen: soft, nt,nd   Musculoskeletal: no LE edema   Data Reviewed: Basic Metabolic Panel:  Recent Labs Lab 02/20/14 1659 02/21/14 0315 02/23/14 0438 02/24/14 0448  NA 140 141 138 140  K 4.0 4.7 4.8 4.7  CL 99 101 100 100  CO2 22 23 21 21   GLUCOSE 126* 151* 135* 126*  BUN 24* 21 37* 35*  CREATININE 1.26* 1.14* 1.22* 1.17*  CALCIUM 10.0 10.3 9.5 10.0   Liver Function Tests:  Recent Labs Lab 02/20/14 1659  AST 17  ALT 18  ALKPHOS 80  BILITOT 0.2*  PROT 7.3  ALBUMIN 4.2   No results found for this basename: LIPASE, AMYLASE,  in the last 168 hours No results found for this basename: AMMONIA,  in the last 168 hours CBC:  Recent Labs Lab 02/20/14 1659 02/21/14  0315  WBC 10.3 9.8  NEUTROABS 7.6  --   HGB 13.9 13.2  HCT 39.8 39.1  MCV 94.8 94.9  PLT 244 221   Cardiac Enzymes: No results found for this basename: CKTOTAL, CKMB, CKMBINDEX, TROPONINI,  in the last 168 hours BNP (last 3 results) No results found for this basename: PROBNP,  in the last 8760 hours CBG: No results found for this basename: GLUCAP,  in the last 168 hours  Recent Results (from the past 240 hour(s))  MRSA PCR SCREENING     Status: None   Collection Time    02/21/14  7:24 AM       Result Value Ref Range Status   MRSA by PCR NEGATIVE  NEGATIVE Final   Comment:            The GeneXpert MRSA Assay (FDA     approved for NASAL specimens     only), is one component of a     comprehensive MRSA colonization     surveillance program. It is not     intended to diagnose MRSA     infection nor to guide or     monitor treatment for     MRSA infections.  SURGICAL PCR SCREEN     Status: Abnormal   Collection Time    02/26/14  2:47 AM      Result Value Ref Range Status   MRSA, PCR NEGATIVE  NEGATIVE Final   Staphylococcus aureus POSITIVE (*) NEGATIVE Final   Comment:            The Xpert SA Assay (FDA     approved for NASAL specimens     in patients over 6 years of age),     is one component of     a comprehensive surveillance     program.  Test performance has     been validated by [employer] for patients greater     than or equal to 22 year old.     It is not intended     to diagnose infection nor to     guide or monitor treatment.     Studies: Dg Chest Port 1 View  02/26/2014   CLINICAL DATA:  Central catheter placement  EXAM: PORTABLE CHEST - 1 VIEW  COMPARISON:  April 10, 2013  FINDINGS: Right jugular catheter tip is in the superior vena cava. Port-A-Cath tip is in the superior vena cava. No pneumothorax.  There is underlying emphysematous change. There is no edema or consolidation. The heart size is normal. Pulmonary vascularity reflects underlying emphysema. No adenopathy. There is degenerative change in the thoracic spine.  IMPRESSION: Central catheters have tips in the superior vena cava. No pneumothorax. Underlying emphysema. No edema or consolidation.   Electronically Signed   By: Lowella Grip M.D.   On: 02/26/2014 13:59    Scheduled Meds: . anastrozole  1 mg Oral Daily  .  ceFAZolin (ANCEF) IV  2 g Intravenous Q8H  . dexamethasone  4 mg Intravenous 4 times per day  . docusate sodium  100 mg Oral BID  . gabapentin  300 mg Oral BID  .  [START ON 02/27/2014] heparin  5,000 Units Subcutaneous 3 times per day  . magnesium oxide  400 mg Oral TID  . potassium chloride  20 mEq Oral Daily  . ranitidine  150 mg Oral BID  . senna  1 tablet Oral BID  . simvastatin  40 mg Oral  [id number]  . sodium chloride  3 mL Intravenous Q12H   Continuous Infusions: . sodium chloride 75 mL/hr at 02/26/14 1500    Principal Problem:   Cerebellar mass Active Problems:   Breast cancer   Brain mass   Obstructive hydrocephalus   Vasogenic brain edema   Head ache   Metastatic cancer to brain    Time spent: >35 minutes     [name]  [facility] Pager [phone]. If 7PM-7AM, please contact night-coverage at [email or web address], password [facility] 02/26/2014, 4:38 PM  LOS: 6 days

## 2014-02-27 LAB — POCT I-STAT 7, (LYTES, BLD GAS, ICA,H+H)
ACID-BASE DEFICIT: 3 mmol/L — AB (ref 0.0–2.0)
Acid-base deficit: 1 mmol/L (ref 0.0–2.0)
Bicarbonate: 24 mEq/L (ref 20.0–24.0)
Bicarbonate: 21.4 mEq/L (ref 20.0–24.0)
CALCIUM ION: 1.25 mmol/L (ref 1.13–1.30)
CALCIUM ION: 1.2 mmol/L (ref 1.13–1.30)
HCT: 29 % — ABNORMAL LOW (ref 36.0–46.0)
HEMATOCRIT: 32 % — AB (ref 36.0–46.0)
HEMOGLOBIN: 10.9 g/dL — AB (ref 12.0–15.0)
Hemoglobin: 9.9 g/dL — ABNORMAL LOW (ref 12.0–15.0)
O2 SAT: 100 %
O2 Saturation: 100 %
PCO2 ART: 38.7 mmHg (ref 35.0–45.0)
PH ART: 7.394 (ref 7.350–7.450)
POTASSIUM: 4.1 meq/L (ref 3.7–5.3)
Patient temperature: 35.8
Potassium: 4 mEq/L (ref 3.7–5.3)
SODIUM: 134 meq/L — AB (ref 137–147)
Sodium: 132 mEq/L — ABNORMAL LOW (ref 137–147)
TCO2: 25 mmol/L (ref 0–100)
TCO2: 22 mmol/L (ref 0–100)
pCO2 arterial: 32.3 mmHg — ABNORMAL LOW (ref 35.0–45.0)
pH, Arterial: 7.425 (ref 7.350–7.450)
pO2, Arterial: 229 mmHg — ABNORMAL HIGH (ref 80.0–100.0)
pO2, Arterial: 183 mmHg — ABNORMAL HIGH (ref 80.0–100.0)

## 2014-02-27 LAB — BASIC METABOLIC PANEL
BUN: 26 mg/dL — AB (ref 6–23)
CALCIUM: 9 mg/dL (ref 8.4–10.5)
CO2: 23 mEq/L (ref 19–32)
Chloride: 102 mEq/L (ref 96–112)
Creatinine, Ser: 1 mg/dL (ref 0.50–1.10)
GFR calc non Af Amer: 59 mL/min — ABNORMAL LOW (ref >90)
GFR, EST AFRICAN AMERICAN: 68 mL/min — AB (ref >90)
GLUCOSE: 124 mg/dL — AB (ref 70–99)
POTASSIUM: 4.2 meq/L (ref 3.7–5.3)
SODIUM: 138 meq/L (ref 137–147)

## 2014-02-27 LAB — CBC
HCT: 29.5 % — ABNORMAL LOW (ref 36.0–46.0)
Hemoglobin: 10.2 g/dL — ABNORMAL LOW (ref 12.0–15.0)
MCH: 32.4 pg (ref 26.0–34.0)
MCHC: 34.6 g/dL (ref 30.0–36.0)
MCV: 93.7 fL (ref 78.0–100.0)
PLATELETS: 181 10*3/uL (ref 150–400)
RBC: 3.15 MIL/uL — ABNORMAL LOW (ref 3.87–5.11)
RDW: 13.3 % (ref 11.5–15.5)
WBC: 14 10*3/uL — ABNORMAL HIGH (ref 4.0–10.5)

## 2014-02-27 MED ORDER — SODIUM CHLORIDE 0.9 % IJ SOLN
10.0000 mL | Freq: Two times a day (BID) | INTRAMUSCULAR | Status: DC
  Administered 2014-02-27 – 2014-03-01 (×5): 10 mL via INTRAVENOUS

## 2014-02-27 NOTE — Progress Notes (Signed)
Pt seen and examined. No issues overnight. Pt has mild HA, controlled with meds. No dizziness, N/V.  EXAM: Temp:  [97.6 F (36.4 C)-98.2 F (36.8 C)] 98 F (36.7 C) (06/17 0812) Pulse Rate:  [52-80] 71 (06/17 1200) Resp:  [10-20] 16 (06/17 1200) BP: (129-172)/(59-99) 152/68 mmHg (06/17 1200) SpO2:  [93 %-100 %] 97 % (06/17 1200) Arterial Line BP: (73-187)/(56-138) 141/138 mmHg (06/17 0900) Weight:  [91.8 kg (202 lb 6.1 oz)] 91.8 kg (202 lb 6.1 oz) (06/16 1500) Intake/Output     06/16 0701 - 06/17 0700 06/17 0701 - 06/18 0700   P.O. 250 375   I.V. (mL/kg) 3303 (36) 395 (4.3)   IV Piggyback 475    Total Intake(mL/kg) 4028 (43.9) 770 (8.4)   Urine (mL/kg/hr) 3600 (1.6) 1075 (1.8)   Blood 300 (0.1)    Total Output 3900 1075   Net +128 -305         Awake, alert, oriented Speech fluent CN intact, FS, TML 5/5 BUE/BLE, no drift Wound c/d/i, no leak  LABS: Lab Results  Component Value Date   CREATININE 1.00 02/27/2014   BUN 26* 02/27/2014   NA 138 02/27/2014   K 4.2 02/27/2014   CL 102 02/27/2014   CO2 23 02/27/2014   Lab Results  Component Value Date   WBC 14.0* 02/27/2014   HGB 10.2* 02/27/2014   HCT 29.5* 02/27/2014   MCV 93.7 02/27/2014   PLT 181 02/27/2014    IMAGING: Postop MRI w/w/o demonstrates small area of continued enhancement in inferolateral cavity likely indicating small residual tumor. No hematoma, good relief of pos fossa mass effect with decreased 4th ventircular effacement.  IMPRESSION: - 63 y.o. female POD#1 s/p suboccipital crani for tumor, frozen sxn c/w metastatic breast met - Neurologically at baseline  PLAN: - OOB with PT/OT - Routine postop care, plan on transfer from ICU tomorrow if stable. - d/c foley/a-line

## 2014-02-27 NOTE — Progress Notes (Signed)
Post op s/p Craniectomy and cerebellar tumor resection 6/16 In Neuro ICU now Medical issues stable, we will not follow Please call us back if can be of assistance  Domenic Polite, MD [phone]

## 2014-02-28 NOTE — Evaluation (Signed)
Physical Therapy Evaluation Patient Details Name: Erika Nunez MRN: [id number] DOB: 02/20/1951 Today's Date: 02/28/2014   History of Present Illness  63 y.o. Female with PMH of HTN, HPL, of locally advanced breast CA undergoing treatment for the past year. She presented with 3 day h/o dizziness. Symptoms onset was abrupt and had been persistent and unchanged found to have brain mass s/p surgery on 6/16  Clinical Impression  Patient demonstrates deficits in functional mobility as indicated below. Will need acute PT to address deficits and maximize recovery, recommend HHPT upon discharge. Will see as indicated and progress as tolerated. Spoke with patient at length regarding safety with mobility and mobility expectations. Educated re: no Chief Operating Officer. Patient appreciative.    Follow Up Recommendations Home health PT;Supervision for mobility/OOB    Equipment Recommendations  Rolling walker with 5" wheels    Recommendations for Other Services       Precautions / Restrictions Precautions Precautions: Fall Restrictions Weight Bearing Restrictions: No      Mobility  Bed Mobility Overal bed mobility: Modified Independent                Transfers Overall transfer level: Needs assistance Equipment used: Rolling walker (2 wheeled);None Transfers: Sit to/from Stand Sit to Stand: Supervision;Min guard         General transfer comment: VCs for safety and stability  Ambulation/Gait Ambulation/Gait assistance: Min guard;Min assist Ambulation Distance (Feet): 210 Feet Assistive device: Rolling walker (2 wheeled);None Gait Pattern/deviations: Step-through pattern;Ataxic;Staggering left;Staggering right Gait velocity: decreased Gait velocity interpretation: Below normal speed for age/gender General Gait Details: pt with some instability noted with ambulation, improved significantly with use of RW  Stairs            Wheelchair Mobility    Modified Rankin (Stroke  Patients Only)       Balance Overall balance assessment: Needs assistance                           High level balance activites: Side stepping;Direction changes;Turns;Head turns High Level Balance Comments: moderate assist for LOB with head turns and staggering gait, improved with use of RW             Pertinent Vitals/Pain NAD, VSS, 2/10    Home Living Family/patient expects to be discharged to:: Private residence Living Arrangements: Children Available Help at Discharge: Family Type of Home: House Home Access: Level entry     Home Layout: One level Home Equipment: None      Prior Function Level of Independence: Independent               Hand Dominance   Dominant Hand: Right    Extremity/Trunk Assessment   Upper Extremity Assessment: Defer to OT evaluation;RUE deficits/detail RUE Deficits / Details: lymphedema hx         Lower Extremity Assessment:  (bilateral peripheral neuropathy)         Communication   Communication: No difficulties  Cognition Arousal/Alertness: Awake/alert Behavior During Therapy: WFL for tasks assessed/performed Overall Cognitive Status: Within Functional Limits for tasks assessed                      General Comments      Exercises        Assessment/Plan    PT Assessment Patient needs continued PT services  PT Diagnosis Difficulty walking;Abnormality of gait   PT Problem List Decreased balance;Decreased activity tolerance;Decreased mobility;Decreased coordination;Pain  PT  Treatment Interventions DME instruction;Gait training;Functional mobility training;Therapeutic activities;Therapeutic exercise;Balance training;Patient/family education   PT Goals (Current goals can be found in the Care Plan section) Acute Rehab PT Goals Patient Stated Goal: to go home and get back to doing well PT Goal Formulation: With patient Time For Goal Achievement: 03/14/14 Potential to Achieve Goals: Good     Frequency Min 4X/week   Barriers to discharge        Co-evaluation               End of Session Equipment Utilized During Treatment: Gait belt Activity Tolerance: Patient tolerated treatment well Patient left: in bed;with call bell/phone within reach;with bed alarm set;with family/visitor present Nurse Communication: Mobility status         Time: 1201-1226 PT Time Calculation (min): 25 min   Charges:   PT Evaluation $Initial PT Evaluation Tier I: 1 Procedure PT Treatments $Gait Training: 8-22 mins $Self Care/Home Management: 8-22   PT G CodesDuncan Dull 02/28/2014, 3:02 PM [name], [place] DPT  [phone]

## 2014-02-28 NOTE — Progress Notes (Signed)
Report received from 18M at 11:15am and pt arrived to the unit at 11:30am via wheelchair with IV intact and infusing along with pt belongings. Pt dentures in mouth upon arrival to the unit; pt vitals taken; pt oriented to the room and unit; HC dsg to neck clean dry and intact with few scant stain noted. Pt ambulated to the BR to void x1 upon arrival to the room. Pt denies any pain; skin intact; SCD's placed on; pt in bed with call light within reach. Will continue to monitor quietly. Francis Gaines Kuffour RN.

## 2014-02-28 NOTE — Progress Notes (Signed)
Pt seen and examined. No issues overnight. Pt has no c/o. Able to get OOB to chair yesterday. No visual changes, N/V, dizziness.  EXAM: Temp:  [97.8 F (36.6 C)-98.4 F (36.9 C)] 98 F (36.7 C) (06/18 0309) Pulse Rate:  [53-80] 64 (06/18 0800) Resp:  [11-19] 12 (06/18 0800) BP: (133-165)/(58-79) 158/67 mmHg (06/18 0800) SpO2:  [94 %-99 %] 97 % (06/18 0800) Arterial Line BP: (141)/(138) 141/138 mmHg (06/17 0900) Intake/Output     06/17 0701 - 06/18 0700 06/18 0701 - 06/19 0700   P.O. 375    I.V. (mL/kg) 1445 (15.7) 75 (0.8)   IV Piggyback     Total Intake(mL/kg) 1820 (19.8) 75 (0.8)   Urine (mL/kg/hr) 1075 (0.5)    Blood     Total Output 1075     Net +745 +75        Urine Occurrence 4 x     Awake, alert, oriented Speech fluent CN intact Good strength Wound c/d/i, no leak  LABS: Lab Results  Component Value Date   CREATININE 1.00 02/27/2014   BUN 26* 02/27/2014   NA 138 02/27/2014   K 4.2 02/27/2014   CL 102 02/27/2014   CO2 23 02/27/2014   Lab Results  Component Value Date   WBC 14.0* 02/27/2014   HGB 10.2* 02/27/2014   HCT 29.5* 02/27/2014   MCV 93.7 02/27/2014   PLT 181 02/27/2014    IMPRESSION: - 63 y.o. female POD# 2 s/p suboccipital crani for resection of tumor - Neurologically at baseline  PLAN: - Transfer to floor - Cont to mobilize - PT/OT

## 2014-03-01 MED ORDER — HEPARIN SOD (PORK) LOCK FLUSH 100 UNIT/ML IV SOLN
500.0000 [IU] | INTRAVENOUS | Status: AC | PRN
  Administered 2014-03-01: 500 [IU]

## 2014-03-01 MED ORDER — DEXAMETHASONE 2 MG PO TABS: 2.0000 mg | ORAL_TABLET | Freq: Three times a day (TID) | ORAL | Status: AC

## 2014-03-01 MED ORDER — HYDROCODONE-ACETAMINOPHEN 5-325 MG PO TABS: 1.0000 | ORAL_TABLET | ORAL | Status: DC | PRN

## 2014-03-01 MED ORDER — DEXAMETHASONE 4 MG PO TABS: 4.0000 mg | ORAL_TABLET | Freq: Three times a day (TID) | ORAL | Status: AC

## 2014-03-01 MED ORDER — DEXAMETHASONE 2 MG PO TABS: 2.0000 mg | ORAL_TABLET | Freq: Two times a day (BID) | ORAL | Status: AC

## 2014-03-01 MED ORDER — DEXAMETHASONE 1 MG PO TABS: 0.5000 mg | ORAL_TABLET | Freq: Two times a day (BID) | ORAL | Status: AC

## 2014-03-01 MED ORDER — DEXAMETHASONE 1 MG PO TABS: 1.0000 mg | ORAL_TABLET | Freq: Two times a day (BID) | ORAL | Status: AC

## 2014-03-01 NOTE — Discharge Summary (Signed)
Physician Discharge Summary  Patient ID: Erika Nunez MRN: [id number] DOB/AGE: [birth year] 63 y.o.  Admit date: 02/20/2014 Discharge date: 03/01/2014  Admission Diagnoses:  1. Brain tumor 2. Breast CA  Discharge Diagnoses: Same Principal Problem:   Cerebellar mass Active Problems:   Breast cancer   Brain mass   Obstructive hydrocephalus   Vasogenic brain edema   Head ache   Metastatic cancer to brain   Discharged Condition: Stable  Hospital Course:  [name] is a 63 y.o. female initially admitted through the emergency department with a chief complaint of dizziness and imbalance with falls. She underwent CT scan followed by MRI of the brain which demonstrated a large posterior fossa tumor. She was observed closely in the step down unit, and eventually was taken for suboccipital craniectomy for resection of the tumor. The procedure was done without complication and she was subsequently taken to the neurointensive care unit where she was found to be at her neurologic baseline. She recovered well, and postoperative MRI was completed. After 2 nights in the intensive care unit, she was awake, alert, getting out of bed, voiding without difficulty, and tolerating diet. She was transferred to the general neuroscience floor, where she continued to recover nicely. She was ambulating with physical therapy, and felt to only require outpatient physical therapy. She was therefore stable for discharge.  Treatments: Surgery -  suboccipital craniectomy for resection of tumor  Discharge Exam: Blood pressure 156/88, pulse 70, temperature 98.6 F (37 C), temperature source Oral, resp. rate 20, height 5\' 8"  (1.727 m), weight 91.8 kg (202 lb 6.1 oz), SpO2 98.00%. Awake, alert, oriented Speech fluent, appropriate CN grossly intact 5/5 BUE/BLE Right UE/LE ataxia Wound c/d/i  Follow-up: Follow-up in my office [facility] [phone]) in 2 weeks  Disposition:  01-Home or Self Care     Medication List         anastrozole 1 MG tablet  Commonly known as:  ARIMIDEX  Take 1 tablet (1 mg total) by mouth daily.     dexamethasone 4 MG tablet  Commonly known as:  DECADRON  Take 1 tablet (4 mg total) by mouth 3 (three) times daily.     dexamethasone 2 MG tablet  Commonly known as:  DECADRON  Take 1 tablet (2 mg total) by mouth 3 (three) times daily.  Start taking on:  03/05/2014     dexamethasone 2 MG tablet  Commonly known as:  DECADRON  Take 1 tablet (2 mg total) by mouth 2 (two) times daily.  Start taking on:  03/08/2014     dexamethasone 1 MG tablet  Commonly known as:  DECADRON  Take 1 tablet (1 mg total) by mouth 2 (two) times daily with a meal.  Start taking on:  03/11/2014     dexamethasone 1 MG tablet  Commonly known as:  DECADRON  Take 0.5 tablets (0.5 mg total) by mouth 2 (two) times daily with a meal.  Start taking on:  03/14/2014     gabapentin 300 MG capsule  Commonly known as:  NEURONTIN  Take 300 mg by mouth 2 (two) times daily.     HYDROcodone-acetaminophen 5-325 MG per tablet  Commonly known as:  NORCO/VICODIN  Take 1 tablet by mouth every 4 (four) hours as needed for moderate pain.     ibuprofen 200 MG tablet  Commonly known as:  ADVIL,MOTRIN  Take 200 mg by mouth every 6 (six) hours as needed. Pain.     lidocaine-prilocaine cream  Commonly known as:  EMLA  Apply topically once. Apply a quarter sized amount to port site 1 hour prior to chemo. Do not rub in. Cover with plastic.     lisinopril 20 MG tablet  Commonly known as:  PRINIVIL,ZESTRIL  Take 20 mg by mouth daily.     lisinopril-hydrochlorothiazide 20-25 MG per tablet  Commonly known as:  PRINZIDE,ZESTORETIC  Take 1 tablet by mouth daily.     LORazepam 1 MG tablet  Commonly known as:  ATIVAN  Take 1 mg by mouth every 4 (four) hours as needed for anxiety.     magnesium oxide 400 MG tablet  Commonly known as:  MAG-OX  Take 400 mg by mouth 3 (three)  times daily.     ondansetron 8 MG tablet  Commonly known as:  ZOFRAN  Take 8 mg by mouth. Starting the day after chemo, take 1 tablet in the am and 1 tablet in the pm for 2 days. Then may take 1 tablet two times a day IF needed for nausea/vomiting.     potassium chloride SA 20 MEQ tablet  Commonly known as:  K-DUR,KLOR-CON  Take 40 mEq by mouth 3 (three) times daily.     pravastatin 80 MG tablet  Commonly known as:  PRAVACHOL  Take 80 mg by mouth at bedtime.         SignedConsuella Lose, C 03/01/2014, 3:44 PM

## 2014-03-01 NOTE — Progress Notes (Signed)
Discharge orders received. Pt for discharge home today. Central line d/c'd and Port-A-Cath deaccessed. Pt given discharge instructions and prescriptions with verbalized understanding. Family in room to assist with discharge. Staff brought pt downstairs via wheelchair. Teresa Coombs, RN

## 2014-03-01 NOTE — Progress Notes (Signed)
Occupational Therapy Evaluation and Discharge Patient Details Name: Erika Nunez MRN: [id number] DOB: 02/07/1951 Today's Date: 03/01/2014    History of Present Illness 63 y.o. Female with PMH of HTN, HPL, of locally advanced breast CA undergoing treatment for the past year. She presented with 3 day h/o dizziness. Symptoms onset was abrupt and had been persistent and unchanged found to have brain mass s/p surgery on 6/16   Clinical Impression   PTA pt was independent with ADLs and functional mobility. Pt currently at [place] level with use of RW. Educated pt on fall prevention and energy conservation strategies to account for decreased balance. Pt also demonstrates with RUE lymphedema at baseline with good management skills and use of lymphedema compression sleeve. Minimal decrease in ROM due to lymphedema, however pt states this is her baseline and RUE is [facility] for tasks. Encouraged pt to continue to follow-up with MD for lymphedema management PRN.     Follow Up Recommendations  No OT follow up;Supervision/Assistance - 24 hour    Equipment Recommendations  3 in 1 bedside comode       Precautions / Restrictions Precautions Precautions: Fall Restrictions Weight Bearing Restrictions: No      Mobility Bed Mobility Overal bed mobility: Modified Independent                Transfers Overall transfer level: Modified independent Equipment used: Rolling walker (2 wheeled);None             General transfer comment: Pt with good hand placement, good stability with use of RW.          ADL Overall ADL's : Modified independent;At baseline                                       General ADL Comments: Pt demonstrated ability to perform figure four position for donning/doffing socks and shoes. Pt performed transfers and functional mobility with RW with good balance. Pt was able to turn head and talk with OT during functional mobility with no LOB. Educated  pt on energy conservation and fall prevention strategies. Pt has RUE lymphedema at baseline which she manages well and has lymphedema compression sleeve. Pt will have 24/7 assistance from her daughter when she returns home. Plan to recommend 3N1 for improved safety and balance in the shower and for bedside use as needed.      Vision  Per pt report, no change from baseline.  No apparent visual deficits.                  Perception Perception Perception Tested?: No   Praxis Praxis Praxis tested?: Within functional limits    Pertinent Vitals/Pain NAD     Hand Dominance Right   Extremity/Trunk Assessment Upper Extremity Assessment Upper Extremity Assessment: Overall WFL for tasks assessed (RUE lymphedema at baseline; pt has good edema control manage)   Lower Extremity Assessment Lower Extremity Assessment: Defer to PT evaluation   Cervical / Trunk Assessment Cervical / Trunk Assessment: Normal   Communication Communication Communication: No difficulties   Cognition Arousal/Alertness: Awake/alert Behavior During Therapy: WFL for tasks assessed/performed Overall Cognitive Status: Within Functional Limits for tasks assessed                                Home Living Family/patient expects to be discharged to:: Private residence  Living Arrangements: Children Available Help at Discharge: Family Type of Home: House Home Access: Level entry     Home Layout: One level     Bathroom Shower/Tub: Tub/shower unit         Home Equipment: None          Prior Functioning/Environment Level of Independence: Independent                                       End of Session Equipment Utilized During Treatment: Surveyor, mining Communication: Mobility status  Activity Tolerance: Patient tolerated treatment well Patient left: in chair;with call bell/phone within reach;with family/visitor present   Time: 0840-0902 OT Time Calculation  (min): 22 min Charges:  OT General Charges $OT Visit: 1 Procedure OT Evaluation $Initial OT Evaluation Tier I: 1 Procedure OT Treatments $Self Care/Home Management : 8-22 mins  Juluis Rainier [phone] 03/01/2014, 9:17 AM

## 2014-03-01 NOTE — Progress Notes (Signed)
Physical Therapy Treatment and Discharge Patient Details Name: Erika Nunez MRN: [id number] DOB: 04-Jun-1951 Today's Date: 03/01/2014    History of Present Illness 63 y.o. Female with PMH of HTN, HPL, of locally advanced breast CA undergoing treatment for the past year. She presented with 3 day h/o dizziness. Symptoms onset was abrupt and had been persistent and unchanged found to have brain mass s/p surgery on 6/16    PT Comments    Session focused on progressing mobility with high level balance activities. Pt at mod i to supervision for all mobility at this time. Will have 24/7 (A) from family upon acute D/C. Pt encouraged to continue ambulation around unit as tolerated. No further acute PT needs warranted at this time. D/C disposition/ recommendations updated to reflect pt progress with therapy.   Follow Up Recommendations  Outpatient PT;Other (comment);Supervision/Assistance - 24 hour (OPPT for high level balance )     Equipment Recommendations  Rolling walker with 5" wheels    Recommendations for Other Services       Precautions / Restrictions Precautions Precautions: None Restrictions Weight Bearing Restrictions: No    Mobility  Bed Mobility Overal bed mobility: Modified Independent             General bed mobility comments: not assessed; pt up in chair and returned to chair  Transfers Overall transfer level: Needs assistance Equipment used: Rolling walker (2 wheeled);None Transfers: Sit to/from Stand Sit to Stand: Supervision;Modified independent (Device/Increase time)         General transfer comment: supervision for min cues for hand placement for safety with RW and to reach for chair   Ambulation/Gait Ambulation/Gait assistance: Modified independent (Device/Increase time) Ambulation Distance (Feet): 500 Feet Assistive device: Rolling walker (2 wheeled) Gait Pattern/deviations: Step-through pattern;Decreased stride length Gait velocity: decreased    General Gait Details: pt reports more comfortable ambulating with RW; no LOB noted; challenged with high level balance activities    Stairs            Wheelchair Mobility    Modified Rankin (Stroke Patients Only)       Balance Overall balance assessment: Needs assistance         Standing balance support: During functional activity;No upper extremity supported Standing balance-Leahy Scale: Fair Standing balance comment: performed high level balance activities Single Leg Stance - Right Leg: 0.5 (x 3) Single Leg Stance - Left Leg: 0.1 (x3) Tandem Stance - Right Leg: 0.1 (x 2 required UE support) Tandem Stance - Left Leg: 0.1 (x2 required UE support) [place]berg - Eyes Opened: 0.5 [place] - Eyes Closed: 0.5 High level balance activites: Head turns;Sudden stops;Backward walking;Direction changes High Level Balance Comments: no LOB noted; able to use RW appropriately and stabilize; picking up objects off ground; instability noted with backwards ambulation only     Cognition Arousal/Alertness: Awake/alert Behavior During Therapy: WFL for tasks assessed/performed Overall Cognitive Status: Within Functional Limits for tasks assessed                      Exercises      General Comments        Pertinent Vitals/Pain 5/10; did not state location; premedicated     Home Living Family/patient expects to be discharged to:: Private residence Living Arrangements: Children Available Help at Discharge: Family Type of Home: House Home Access: Level entry   Home Layout: One level Home Equipment: None      Prior Function Level of Independence: Independent  PT Goals (current goals can now be found in the care plan section) Acute Rehab PT Goals Patient Stated Goal: to go home and get back to doing well PT Goal Formulation: With patient Time For Goal Achievement: 03/14/14 Potential to Achieve Goals: Good Progress towards PT goals: Goals met/education  completed, patient discharged from PT    Frequency  Min 4X/week    PT Plan Discharge plan needs to be updated    Co-evaluation             End of Session Equipment Utilized During Treatment: Gait belt Activity Tolerance: Patient tolerated treatment well Patient left: in CPM;with call bell/phone within reach;with family/visitor present     Time: 5001-6429 PT Time Calculation (min): 14 min  Charges:  $Gait Training: 8-22 mins                    G CodesMelina Modena [place], [place]  [phone] 03/01/2014, 10:42 AM

## 2014-03-01 NOTE — Progress Notes (Signed)
UR COMPLETED  

## 2014-03-03 ENCOUNTER — Encounter (HOSPITAL_COMMUNITY): Payer: Self-pay | Admitting: Neurosurgery

## 2014-03-11 ENCOUNTER — Other Ambulatory Visit: Payer: Self-pay | Admitting: Radiation Therapy

## 2014-03-11 ENCOUNTER — Encounter (HOSPITAL_COMMUNITY): Payer: Self-pay

## 2014-03-11 DIAGNOSIS — C7931 Secondary malignant neoplasm of brain: Secondary | ICD-10-CM

## 2014-03-11 DIAGNOSIS — C7949 Secondary malignant neoplasm of other parts of nervous system: Principal | ICD-10-CM

## 2014-03-12 ENCOUNTER — Other Ambulatory Visit: Payer: Self-pay | Admitting: Radiation Therapy

## 2014-03-12 DIAGNOSIS — C7949 Secondary malignant neoplasm of other parts of nervous system: Principal | ICD-10-CM

## 2014-03-12 DIAGNOSIS — C7931 Secondary malignant neoplasm of brain: Secondary | ICD-10-CM

## 2014-03-13 ENCOUNTER — Telehealth (HOSPITAL_COMMUNITY): Payer: Self-pay

## 2014-03-13 NOTE — Telephone Encounter (Signed)
Message copied by Mellissa Kohut on Wed Mar 13, 2014  3:08 PM ------      Message from: [place], [place]: Wed Mar 13, 2014  1:53 PM       Please change in her appointment to the week of 04/08/2014 so that I may see her. Thank you. Dr. Wanda Plump. ------

## 2014-03-13 NOTE — Telephone Encounter (Signed)
Call from patient, recently hospitalized and had surgery to remove brain lesion.  Is scheduled to have [place] through [facility].  Just wanted to let us know and to make sure needed to keep her appointment here on 7/21.

## 2014-03-13 NOTE — Telephone Encounter (Signed)
Patient notified and verbalized understanding of instructions. 

## 2014-03-18 ENCOUNTER — Ambulatory Visit: Payer: MEDICAID | Admitting: Radiation Oncology

## 2014-03-18 ENCOUNTER — Telehealth (HOSPITAL_COMMUNITY): Payer: Self-pay

## 2014-03-18 ENCOUNTER — Ambulatory Visit: Payer: Medicaid Other

## 2014-03-18 ENCOUNTER — Other Ambulatory Visit (HOSPITAL_COMMUNITY): Payer: Self-pay | Admitting: Hematology and Oncology

## 2014-03-18 ENCOUNTER — Ambulatory Visit: Payer: MEDICAID

## 2014-03-18 ENCOUNTER — Ambulatory Visit: Payer: Medicaid Other | Admitting: Radiation Oncology

## 2014-03-18 MED ORDER — HYDROCODONE-ACETAMINOPHEN 5-325 MG PO TABS: ORAL_TABLET | ORAL | Status: DC

## 2014-03-18 MED ORDER — FAMCICLOVIR 500 MG PO TABS: ORAL_TABLET | ORAL | Status: DC

## 2014-03-18 NOTE — Telephone Encounter (Signed)
Daughter notified and she will come by and pick up the prescription for hydrocodone.  Understands to start the famvir and to come for office visit on Thursday.

## 2014-03-18 NOTE — Telephone Encounter (Signed)
Message copied by Mellissa Kohut on Mon Mar 18, 2014  5:20 PM ------      Message from: Bearden, [place]: Mon Mar 18, 2014  4:19 PM       Most likely herpes zoster. Will order Famvir 500 mg 3 times a day along with hydrocodone/APAP for pain. She should be seen some time this week. Thanks ------

## 2014-03-18 NOTE — Telephone Encounter (Signed)
Message copied by Mellissa Kohut on Mon Mar 18, 2014  4:34 PM ------      Message from: [place], [place]: Mon Mar 18, 2014  4:19 PM       Most likely herpes zoster. Will order Famvir 500 mg 3 times a day along with hydrocodone/APAP for pain. She should be seen some time this week. Thanks ------

## 2014-03-18 NOTE — Telephone Encounter (Signed)
Call from daughter, stating that her mom has cluster of blisters on right flank area that started on Saturday and is about 5 inches in diameter.  Initially mother just thought it was hives and when daughter examined area noted the blisters. Complains with severe pain and itching.  Rates pain at level of 8.  Does not have any pain medication at home.

## 2014-03-19 ENCOUNTER — Encounter: Payer: Self-pay | Admitting: Radiation Oncology

## 2014-03-19 NOTE — Progress Notes (Signed)
The patient called the clinic on 03/18/2014 with a rash that is very painful and itching that did not cross the midline.  As a result, she was started on Famvir for possible shingles outbreak and today's appointment was made to follow-up on the rash.  Her rash is erythematous with a conglomeration of healed papules in a zosteriform pattern with a large conglomerate of papules following T6/T7 dermatome.  She admits that it itches and hurts.  She was started on Famvir on 03/18/2014.  She is to get that refilled for a complete 20 day course. She is also on Gabapentin for a separate issue so she is encouraged to continue with this medication for herpetic-induced discomfort.  She was also given Hydrocodone on 7/6.  Patient education was provided regarding shingles.  Additionally, given her brain recurrence, we will restage her with a PET scan.  This is ordered.   She is scheduled for [facility] on 7/16.  She is to return following SRS and PET scan on 7/27 for follow-up.  Patient and plan discussed with Dr. Farrel Gobble and he is in agreement with the aforementioned.   KEFALAS,THOMAS 03/21/2014

## 2014-03-19 NOTE — Progress Notes (Signed)
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  Patient presented with symptoms of:  Presented to emergency room on 02/20/2014 with dizziness, imbalance, frequent falls, blurry vision, difficulty with handwriting, and headache  Past or anticipated interventions, if any, per neurosurgery: suboccipital craniectomy done 02/26/2014  Past or anticipated interventions, if any, per medical oncology: patient taking Arimidex for prior breast cancer hx  Dose of Decadron, if applicable: decadron 4 mg tid  Recent neurologic symptoms, if any:   Seizures: None noted  Headaches: Yes upon presentation  Nausea: Yes upon presentation  Dizziness/ataxia: Yes upon presentation  Difficulty with hand coordination: Yes upon presentation  Focal numbness/weakness: None noted  Visual deficits/changes: Yes upon presentation  Confusion/Memory deficits: None noted  Painful bone metastases at present, if any: NO  SAFETY ISSUES:  Prior radiation? YES (breast)  Pacemaker/ICD? NO  Possible current pregnancy? NO  Is the patient on methotrexate? NO  Additional Complaints / other details:  1. MRI-  7/15 arrive at 2:30 for your 3:00 appointment- 3T Planning MRI done at [facility] on [facility]. We do the imaging at this location because they have a scanner with a higher powered magnet that is great for imaging of the brain. A special protocol is used for your scan for the planning of your treatment.   2. Consult with Dr. Tammi Klippel -  Thursday 7/16 @ 8:00am - Dr. Tammi Klippel will explain the process and what the difference is between Stereotactic Radiosurgery and Conventional Radiation. After you have had the chance to ask questions and you feel good about the process, Dr. Johny Shears nurse will start an IV for [name] to be able to use IV contrast during our simulation procedure. If you have a power port and would like [name] to access that we will. Feel free to add some numbing cream when you get  here.  3. Simulation- 7/16 following your consult with Dr. Tammi Klippel. This appointment is for [name] to make the special mask that will be used during your treatment and to do a CT scan of your brain while you are in the mask. This will be used with your planning MRI to calculate your treatment. 4. Treatment day- Friday 7/17 - please arrive at 12:40 for your 1:00 treatment appointment. Your treatment will take 30-40 min and then you will stay in our department for an hour of observation. It is standard protocol for [name] to monitor a patient for an hour following their procedure to make sure you do not develop a headache or nausea. We have not had a problem with this in patients, but we still like to keep you to make sure.

## 2014-03-20 ENCOUNTER — Ambulatory Visit: Payer: MEDICAID | Admitting: Radiation Oncology

## 2014-03-21 ENCOUNTER — Telehealth (HOSPITAL_COMMUNITY): Payer: Self-pay | Admitting: *Deleted

## 2014-03-21 ENCOUNTER — Encounter (HOSPITAL_COMMUNITY): Payer: Medicaid Other | Attending: Oncology | Admitting: Oncology

## 2014-03-21 VITALS — BP 129/67 | HR 112 | Temp 98.4°F | Resp 20 | Wt 208.7 lb

## 2014-03-21 DIAGNOSIS — Z853 Personal history of malignant neoplasm of breast: Secondary | ICD-10-CM | POA: Insufficient documentation

## 2014-03-21 DIAGNOSIS — C7931 Secondary malignant neoplasm of brain: Secondary | ICD-10-CM | POA: Insufficient documentation

## 2014-03-21 DIAGNOSIS — C7949 Secondary malignant neoplasm of other parts of nervous system: Principal | ICD-10-CM

## 2014-03-21 DIAGNOSIS — T451X5A Adverse effect of antineoplastic and immunosuppressive drugs, initial encounter: Secondary | ICD-10-CM | POA: Insufficient documentation

## 2014-03-21 DIAGNOSIS — C50919 Malignant neoplasm of unspecified site of unspecified female breast: Secondary | ICD-10-CM | POA: Insufficient documentation

## 2014-03-21 NOTE — Telephone Encounter (Signed)
Message copied by Gerhard Perches on Thu Mar 21, 2014  4:04 PM ------      Message from: Farrel Gobble A      Created: Thu Mar 21, 2014  2:17 PM       Please ask pathology to submit [id number] specimen for Foundation One analysis. Thanks ------

## 2014-03-21 NOTE — Telephone Encounter (Signed)
This specimen has been requested for Foundation one testing. Done @ 1605 on 03/21/14.

## 2014-03-21 NOTE — Patient Instructions (Signed)
[facility] Discharge Instructions  RECOMMENDATIONS MADE BY THE CONSULTANT AND ANY TEST RESULTS WILL BE SENT TO YOUR REFERRING PHYSICIAN. Continue your gabapentin and pain medications as prescribed. You will have a PET scan around July 20th, we have ordered and will get approval. Keep your appointment on the 16th with Radiation Oncologist. We will see you as scheduled.   Thank you for choosing [facility] to provide your oncology and hematology care.  To afford each patient quality time with our providers, please arrive at least 15 minutes before your scheduled appointment time.  With your help, our goal is to use those 15 minutes to complete the necessary work-up to ensure our physicians have the information they need to help with your evaluation and healthcare recommendations.    Effective January 1st, 2014, we ask that you re-schedule your appointment with our physicians should you arrive 10 or more minutes late for your appointment.  We strive to give you quality time with our providers, and arriving late affects you and other patients whose appointments are after yours.    Again, thank you for choosing [facility].  Our hope is that these requests will decrease the amount of time that you wait before being seen by our physicians.       _____________________________________________________________  Should you have questions after your visit to [facility], please contact our office at [phone] between the hours of 8:30 a.m. and 4:30 p.m.  Voicemails left after 4:30 p.m. will not be returned until the following business day.  For prescription refill requests, have your pharmacy contact our office with your prescription refill request.    _______________________________________________________________  We hope that we have given you very good care.  You may receive a patient satisfaction survey in the mail, please complete it  and return it as soon as possible.  We value your feedback!  _______________________________________________________________  Have you asked about our STAR program?  STAR stands for Survivorship Training and Rehabilitation, and this is a nationally recognized cancer care program that focuses on survivorship and rehabilitation.  Cancer and cancer treatments may cause problems, such as, pain, making you feel tired and keeping you from doing the things that you need or want to do. Cancer rehabilitation can help. Our goal is to reduce these troubling effects and help you have the best quality of life possible.  You may receive a survey from a nurse that asks questions about your current state of health.  Based on the survey results, all eligible patients will be referred to the [facility] program for an evaluation so we can better serve you!  A frequently asked questions sheet is available upon request.

## 2014-03-22 ENCOUNTER — Other Ambulatory Visit (HOSPITAL_COMMUNITY)
Admission: RE | Admit: 2014-03-22 | Discharge: 2014-03-22 | Disposition: A | Discharge status: Home / Self Care | Payer: Medicaid Other | Source: Ambulatory Visit | Attending: Hematology and Oncology | Admitting: Hematology and Oncology

## 2014-03-22 DIAGNOSIS — C720 Malignant neoplasm of spinal cord: Secondary | ICD-10-CM | POA: Diagnosis present

## 2014-03-22 DIAGNOSIS — C719 Malignant neoplasm of brain, unspecified: Secondary | ICD-10-CM | POA: Diagnosis present

## 2014-03-26 ENCOUNTER — Ambulatory Visit (HOSPITAL_COMMUNITY): Payer: MEDICAID

## 2014-03-26 ENCOUNTER — Ambulatory Visit (HOSPITAL_COMMUNITY): Payer: Medicaid Other

## 2014-03-27 ENCOUNTER — Encounter: Payer: Self-pay | Admitting: Radiation Oncology

## 2014-03-27 ENCOUNTER — Ambulatory Visit
Admission: RE | Admit: 2014-03-27 | Discharge: 2014-03-27 | Disposition: A | Discharge status: Home / Self Care | Payer: Medicaid Other | Source: Ambulatory Visit | Attending: Radiation Oncology | Admitting: Radiation Oncology

## 2014-03-27 DIAGNOSIS — C7949 Secondary malignant neoplasm of other parts of nervous system: Principal | ICD-10-CM

## 2014-03-27 DIAGNOSIS — C7931 Secondary malignant neoplasm of brain: Secondary | ICD-10-CM

## 2014-03-27 MED ORDER — GADOBENATE DIMEGLUMINE 529 MG/ML IV SOLN
19.0000 mL | Freq: Once | INTRAVENOUS | Status: AC | PRN
  Administered 2014-03-27: 19 mL via INTRAVENOUS

## 2014-03-27 NOTE — Progress Notes (Signed)
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  HISTORY OF PRESENT ILLNESS::Erika Nunez is a 63 y.o. female who presented in January of 2014 with a Stage IV, Her2+, ER-, PR-, KI-67 70% locally advanced ulcerated right breast cancer.  She received 6 cycles of carbo and taxotere with pertuzumab and trastuzumab through 02/12/13, then bilateral mastectomies and chest wall radiotherapy.  On 02/20/14, she presented with dizziness for 3 days. She states that started abruptly and persistent. She had nausea, and did throw up some. She was unable to walk without someone else helping her since this started on 02/17/14.    MRI brain w/w/o demonstrated a 4x4.5cm right cerebellar hemisphere homogeneously enhancing mass with surrounding edema and effacement of the fourth ventricle.  Due to early hydrocephalus, she underwent suboccipital craniectomy with tumor resection on 02/26/14 with Dr. Kathyrn Sheriff.  Post-op MRI showed resection of th posterior right cerebellar mass with minimal to mild postoperative enhancement at the resection site except for an 8-10 mm nodular area along the anterior resection cavity.  She has been referred today to discuss possible radiation treatment options.  PREVIOUS RADIATION THERAPY: Yes bilateral chest wall irradiation following bilateral mastectomy 07/10/13-08/29/13  PAST MEDICAL HISTORY:  has a past medical history of Hypertension; High cholesterol; Cancer; Breast cancer; Allergy; Arthritis; Anxiety; Breast mass, left (2014); Breast mass, right (2014); Antineoplastic chemotherapy induced pancytopenia (01/04/2013); Depression; and Lymphedema of arm.    PAST SURGICAL HISTORY: Past Surgical  History  Procedure Laterality Date  . Cholecystectomy    . Tubal ligation    . Tonsillectomy    . Breast biopsy  10/02/2012    Procedure: BREAST BIOPSY;  Surgeon: Donato Heinz, MD;  Location: [facility];  Service: General;  Laterality: Right;  . Portacath placement Left 10/20/2012  . Breast biopsy Left 10/20/2012    Procedure: BREAST BIOPSY;  Surgeon: Donato Heinz, MD;  Location: [facility];  Service: General;  Laterality: Left;  Procedure end 1142  . Portacath placement Left 10/20/2012    Procedure: INSERTION PORT-A-CATH;  Surgeon: Donato Heinz, MD;  Location: [facility];  Service: General;  Laterality: Left;  Procedure began 8937; left subclavian  . Simple mastectomy with axillary sentinel node biopsy Bilateral 04/25/2013    Procedure: SIMPLE MASTECTOMY;  Surgeon: Donato Heinz, MD;  Location: [facility];  Service: General;  Laterality: Bilateral;  . Craniotomy Right 02/26/2014    Procedure: suboccipital craniotomy for tumor resection;  Surgeon: Consuella Lose, MD;  Location: [facility];  Service: Neurosurgery;  Laterality: Right;  suboccipital craniotomy for tumor resection    FAMILY HISTORY: family history includes COPD in her daughter; Cancer in her maternal aunt, maternal aunt, and maternal grandmother; Heart attack in her father; Stroke in her maternal uncle.  SOCIAL HISTORY:  reports that she has never smoked. She has never used smokeless tobacco. She reports that she drinks about .6 ounces of alcohol per week. She reports that she does not use illicit drugs.  ALLERGIES: Codeine  MEDICATIONS:  Current Outpatient Prescriptions  Medication Sig Dispense Refill  . anastrozole (ARIMIDEX) 1 MG tablet Take 1 tablet (1 mg total) by mouth daily.  30 tablet  12  . famciclovir (FAMVIR) 500 MG tablet Take 1  tablet 3 times a day for 10 days. May repeat.  30 tablet  1  . gabapentin (NEURONTIN) 300 MG capsule Take 300 mg by mouth 2 (two) times daily.      Marland Kitchen HYDROcodone-acetaminophen (NORCO/VICODIN)  5-325 MG per tablet Take 1 tablet by mouth every 4 (four) hours as needed for moderate pain.  30 tablet  0  . ibuprofen (ADVIL,MOTRIN) 200 MG tablet Take 200 mg by mouth every 6 (six) hours as needed. Pain.      . lidocaine-prilocaine (EMLA) cream Apply topically once. Apply a quarter sized amount to port site 1 hour prior to chemo. Do not rub in. Cover with plastic.      Marland Kitchen lisinopril (PRINIVIL,ZESTRIL) 20 MG tablet Take 20 mg by mouth daily.      Marland Kitchen lisinopril-hydrochlorothiazide (PRINZIDE,ZESTORETIC) 20-25 MG per tablet Take 1 tablet by mouth daily.      Marland Kitchen LORazepam (ATIVAN) 1 MG tablet Take 1 mg by mouth every 4 (four) hours as needed for anxiety.      . magnesium oxide (MAG-OX) 400 MG tablet Take 400 mg by mouth 3 (three) times daily.      . ondansetron (ZOFRAN) 8 MG tablet Take 8 mg by mouth. Starting the day after chemo, take 1 tablet in the am and 1 tablet in the pm for 2 days. Then may take 1 tablet two times a day IF needed for nausea/vomiting.      . potassium chloride SA (K-DUR,KLOR-CON) 20 MEQ tablet Take 40 mEq by mouth 3 (three) times daily.      . pravastatin (PRAVACHOL) 80 MG tablet Take 80 mg by mouth at bedtime.       No current facility-administered medications for this encounter.    REVIEW OF SYSTEMS:  A 15 point review of systems is documented in the electronic medical record. This was obtained by the nursing staff. However, I reviewed this with the patient to discuss relevant findings and make appropriate changes.  Pertinent items are noted in HPI.   PHYSICAL EXAM:  vitals were not taken for this visit.  Per neurosurgery pre-operatively:  NEUROLOGIC EXAM:  Awake, alert, oriented  Memory and concentration grossly intact  Speech fluent, appropriate  CN grossly intact  Motor exam:  Upper Extremities  Deltoid  Bicep  Tricep  Grip   Right  5/5  5/5  5/5  5/5   Left  5/5  5/5  5/5  5/5    Lower Extremity  IP  Quad  PF  DF  EHL   Right  5/5  5/5  5/5  5/5  5/5   Left  5/5   5/5  5/5  5/5  5/5   Sensation grossly intact to LT  Ataxic with F->N on right  Post-op, I find no new deficits and adequate healing of the surgical site.   KPS = 90  100 - Normal; no complaints; no evidence of disease. 90   - Able to carry on normal activity; minor signs or symptoms of disease. 80   - Normal activity with effort; some signs or symptoms of disease. 64   - Cares for self; unable to carry on normal activity or to do active work. 60   - Requires occasional assistance, but is able to care for most of his personal needs. 50   - Requires considerable assistance and frequent medical care. 63   - Disabled; requires special care and assistance. 30   - Severely disabled; hospital admission is indicated although  death not imminent. 31   - Very sick; hospital admission necessary; active supportive treatment necessary. 10   - Moribund; fatal processes progressing rapidly. 0     - Dead  Karnofsky DA, Abelmann Stuart, Craver LS and [place] JH [phone]) The use of the nitrogen mustards in the palliative treatment of carcinoma: with particular reference to bronchogenic carcinoma Cancer 1 634-56  LABORATORY DATA:  Lab Results  Component Value Date   WBC 14.0* 02/27/2014   HGB 10.2* 02/27/2014   HCT 29.5* 02/27/2014   MCV 93.7 02/27/2014   PLT 181 02/27/2014   Lab Results  Component Value Date   NA 138 02/27/2014   K 4.2 02/27/2014   CL 102 02/27/2014   CO2 23 02/27/2014   Lab Results  Component Value Date   ALT 18 02/20/2014   AST 17 02/20/2014   ALKPHOS 80 02/20/2014   BILITOT 0.2* 02/20/2014     RADIOGRAPHY: Mr Jeri Cos Wo Contrast  02/26/2014   CLINICAL DATA:  63 year old female status post resection of right cerebellar mass. Preliminary pathology reveals metastatic breast cancer. Restaging.  EXAM: MRI HEAD WITHOUT AND WITH CONTRAST  TECHNIQUE: Multiplanar, multiecho pulse sequences of the brain and surrounding structures were obtained without and with intravenous contrast.  CONTRAST:   50m MULTIHANCE GADOBENATE DIMEGLUMINE 529 MG/ML IV SOLN  COMPARISON:  Preoperative MRI 02/20/2014.  FINDINGS: Sequelae of right suboccipital craniotomy. Decreased but not resolved cerebellar and posterior fossa edema and mass effect. Decreased lateral and third ventricle size. Blood products at the resection site, some with intrinsic T1 hyperintensity. These may in part explain small patchy areas of restricted diffusion along the margins of the resection cavity. Still, there is a nodular area of post-contrast enhancement measuring 8-10 mm diameter along the anterior resection cavity (a series 14, image 15 and series 13 image 9. Mild enhancement along the cavity elsewhere. Small volume overlying extra-axial collection.  Postoperative changes to the overlying scalp soft tissues. Stable supratentorial gray and white matter signal except for decreased periventricular T2 hyperintensity. No new enhancing lesion identified.  Stable pituitary, cervicomedullary junction and visualized cervical spine. Major intracranial vascular flow voids are stable. Mild mastoid effusions have not significantly changed. Stable paranasal sinuses. Stable orbits.  IMPRESSION: 1. Resection of posterior right cerebellar mass. Minimal to mild postoperative enhancement at the resection site except for an 8-10 mm nodular area along the anterior resection cavity (series 14, image 15). Attention directed on followup. 2. Interval regressed posterior fossa edema and mass effect. Interval resolved lateral and third ventriculomegaly. 3. No new intracranial abnormality identified.   Electronically Signed   By: LLars PinksM.D.   On: 02/26/2014 21:41   Dg Chest Port 1 View  02/26/2014   CLINICAL DATA:  Central catheter placement  EXAM: PORTABLE CHEST - 1 VIEW  COMPARISON:  April 10, 2013  FINDINGS: Right jugular catheter tip is in the superior vena cava. Port-A-Cath tip is in the superior vena cava. No pneumothorax.  There is underlying emphysematous  change. There is no edema or consolidation. The heart size is normal. Pulmonary vascularity reflects underlying emphysema. No adenopathy. There is degenerative change in the thoracic spine.  IMPRESSION: Central catheters have tips in the superior vena cava. No pneumothorax. Underlying emphysema. No edema or consolidation.   Electronically Signed   By: WLowella GripM.D.   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  At this point, the patient would potentially benefit from radiotherapy. The options include whole brain irradiation versus stereotactic radiosurgery. There are pros and cons associated with each of these potential treatment options. Whole brain radiotherapy would treat the known metastatic deposits and help provide some reduction of risk for future brain metastases. However, whole brain radiotherapy carries potential risks including hair loss, subacute somnolence, and neurocognitive changes including a possible reduction in short-term memory. Whole brain radiotherapy also may carry a lower likelihood of tumor control at the treatment sites because of the low-dose used. Stereotactic radiosurgery carries a higher likelihood for local tumor control at the targeted sites with lower associated risk for neurocognitive changes such as memory loss. However, the use of stereotactic radiosurgery in this setting may leave the patient at increased risk for new brain metastases elsewhere in the brain as high as 50-60%. Accordingly, patients who receive stereotactic radiosurgery in this setting should undergo ongoing surveillance imaging with brain MRI more frequently in order to identify and treat new small brain metastases before they become symptomatic. Stereotactic radiosurgery does carry some different risks, including a risk of  radionecrosis.  PLAN: Today, I reviewed the findings and workup thus far with the patient. We discussed the dilemma regarding whole brain radiotherapy versus stereotactic radiosurgery. We discussed the pros and cons of each. We also discussed the logistics and delivery of each. We reviewed the results associated with each of the treatments described above. The patient seems to understand the treatment options and would like to proceed with stereotactic radiosurgery.  I spent 60 minutes minutes face to face with the patient and more than 50% of that time was spent in counseling and/or coordination of care.   ------------------------------------------------  Sheral Apley. Tammi Klippel, M.D.

## 2014-03-27 NOTE — Progress Notes (Signed)
  Radiation Oncology         [phone] ________________________________  Name: Erika Nunez MRN: [id number]  Date: 03/28/2014  DOB: 03-09-1951  SIMULATION AND TREATMENT PLANNING NOTE  DIAGNOSIS:  63 yo woman s/p resection of a 4.7 cm right cerebellar metastasis from metastatic ER negative HER-2 positive invasive lobular carcinoma of the upper outer right breast - stage IV  NARRATIVE:  The patient was brought to the [facility].  Identity was confirmed.  All relevant records and images related to the planned course of therapy were reviewed.  The patient freely provided informed written consent to proceed with treatment after reviewing the details related to the planned course of therapy. The consent form was witnessed and verified by the simulation staff. Intravenous access was established for contrast administration. Then, the patient was set-up in a stable reproducible supine position for radiation therapy.  A relocatable thermoplastic stereotactic head frame was fabricated for precise immobilization.  CT images were obtained.  Surface markings were placed.  The CT images were loaded into the planning software and fused with the patient's targeting MRI scan.  Then the target and avoidance structures were contoured.  Treatment planning then occurred.  The radiation prescription was entered and confirmed.  I have requested 3D planning  I have requested a DVH of the following structures: Brain stem, brain, left eye, right eye, lenses, optic chiasm, target volumes, uninvolved brain, and normal tissue.    PLAN:  The patient will receive 15 Gy in one fraction.  ________________________________  Sheral Apley Tammi Klippel, M.D.

## 2014-03-28 ENCOUNTER — Ambulatory Visit
Admission: RE | Admit: 2014-03-28 | Discharge: 2014-03-28 | Disposition: A | Discharge status: Home / Self Care | Payer: Medicaid Other | Source: Ambulatory Visit | Attending: Radiation Oncology | Admitting: Radiation Oncology

## 2014-03-28 ENCOUNTER — Encounter: Payer: Self-pay | Admitting: Radiation Oncology

## 2014-03-28 VITALS — BP 118/56 | HR 98 | Resp 16 | Ht 67.0 in | Wt 207.3 lb

## 2014-03-28 DIAGNOSIS — C7931 Secondary malignant neoplasm of brain: Secondary | ICD-10-CM | POA: Insufficient documentation

## 2014-03-28 DIAGNOSIS — C7949 Secondary malignant neoplasm of other parts of nervous system: Principal | ICD-10-CM

## 2014-03-28 DIAGNOSIS — Z510 Encounter for antineoplastic radiation therapy: Secondary | ICD-10-CM | POA: Insufficient documentation

## 2014-03-28 DIAGNOSIS — C50919 Malignant neoplasm of unspecified site of unspecified female breast: Secondary | ICD-10-CM | POA: Diagnosis not present

## 2014-03-28 MED ORDER — HEPARIN SOD (PORK) LOCK FLUSH 100 UNIT/ML IV SOLN
500.0000 [IU] | Freq: Once | INTRAVENOUS | Status: AC
  Administered 2014-03-28: 500 [IU] via INTRAVENOUS

## 2014-03-28 MED ORDER — SODIUM CHLORIDE 0.9 % IJ SOLN
10.0000 mL | Freq: Once | INTRAMUSCULAR | Status: AC
  Administered 2014-03-28: 10 mL via INTRAVENOUS

## 2014-03-28 NOTE — Progress Notes (Signed)
Patient reports that she tapered off decadron over a week ago. Reports she continues to take Arimidex. Also, reports she is taking Famvir for the shingles. Lymphedema sleeve on right arm noted. Reports headaches, dizziness, imbalance, blurry vision, and difficulty writing resolved following surgery. Steady gait noted today. Patient ambulating without the aid of a walker or cane. Denies seizure activity. Denies episodes of confusion or decline in her short term memory. Vertical posterior scalp incision well approximated without redness, drainage or edema.

## 2014-03-28 NOTE — Progress Notes (Signed)
See progress note under physician encounter. 

## 2014-03-28 NOTE — Progress Notes (Signed)
Left subclavian power port de accessed by Thayer Headings, Therapist, sports. She reports flushing port per protocol. Thayer Headings, RN reports the needle was intact upon removal. Bandaid applied to old access site. Patient tolerated well.

## 2014-03-28 NOTE — Progress Notes (Signed)
Patient denies having a pacemaker. Patient has a hx of radiation therapy to her breast. Patient denies being a diabetic or taking metformin. Patient denies that she is allergic to IV dye. BUN high at 26 with a creatinine of 1. Dr. Tammi Klippel approved patient for contrast despite lab levels. Accessed left subclavian power port on the first attempt. Patient tolerated well. Excellent blood return. Flushed without complication. Secured needle in place. Escorted patient to CT for simulation.

## 2014-03-29 ENCOUNTER — Ambulatory Visit: Admission: RE | Admit: 2014-03-29 | Payer: MEDICAID | Source: Ambulatory Visit | Admitting: Radiation Oncology

## 2014-03-29 ENCOUNTER — Ambulatory Visit
Admission: RE | Admit: 2014-03-29 | Discharge: 2014-03-29 | Disposition: A | Discharge status: Home / Self Care | Payer: Medicaid Other | Source: Ambulatory Visit | Attending: Radiation Oncology | Admitting: Radiation Oncology

## 2014-03-29 ENCOUNTER — Encounter: Payer: Self-pay | Admitting: Radiation Oncology

## 2014-03-29 VITALS — BP 107/53 | HR 81 | Temp 98.3°F | Resp 16

## 2014-03-29 DIAGNOSIS — Z510 Encounter for antineoplastic radiation therapy: Secondary | ICD-10-CM | POA: Diagnosis not present

## 2014-03-29 DIAGNOSIS — C7931 Secondary malignant neoplasm of brain: Secondary | ICD-10-CM

## 2014-03-29 NOTE — Progress Notes (Signed)
Received patient in the clinic following [place] treatment. Patient accompanied by her daughter. . Patient alert and oriented to person, place, and time. Patient denies taking decadron. Patient denies headache, dizziness, nausea or diplopia. Provided patient with ginger ale. Vitals stable. Patient provided with follow up appointment card by Marcine Matar. Patient understands to contact staff with future needs. Will continue to monitor patient over the next hour.

## 2014-03-29 NOTE — Progress Notes (Signed)
Patient denies pain, headache, dizziness, vision changes and nausea.  Her vitals were: bp 107/53, hr 81, temp 98.3 and oxygen saturation of 98%.  Patient was given discharge instructions by Joaquim Lai, RN.   Patient escorted out of the clinic with her family member.

## 2014-03-29 NOTE — Progress Notes (Signed)
  Radiation Oncology         [phone]) [phone] ________________________________  Stereotactic Treatment Procedure Note  Name: Erika Nunez MRN: [id number]  Date: 03/29/2014  DOB: 01/22/1951  SPECIAL TREATMENT PROCEDURE  3D TREATMENT PLANNING AND DOSIMETRY:  The patient's radiation plan was reviewed and approved by neurosurgery and radiation oncology prior to treatment.  It showed 3-dimensional radiation distributions overlaid onto the planning CT/MRI image set.  The [facility] for the target structures as well as the organs at risk were reviewed. The documentation of the 3D plan and dosimetry are filed in the radiation oncology EMR.  NARRATIVE:  Erika Nunez was brought to the TrueBeam stereotactic radiation treatment machine and placed supine on the CT couch. The head frame was applied, and the patient was set up for stereotactic radiosurgery.  Neurosurgery was present for the set-up and delivery  SIMULATION VERIFICATION:  In the couch zero-angle position, the patient underwent Exactrac imaging using the Brainlab system with orthogonal KV images.  These were carefully aligned and repeated to confirm treatment position for each of the isocenters.  The Exactrac snap film verification was repeated at each couch angle.  SPECIAL TREATMENT PROCEDURE: Erika Nunez received stereotactic radiosurgery to the following targets: Right cerebellar 4.7 cm resection cavity target was treated using 5 Dynamic Conformal Arcs to a prescription dose of 15 Gy.  ExacTrac registration was performed for each couch angle.  The 83.3% isodose line was prescribed.   STEREOTACTIC TREATMENT MANAGEMENT:  Following delivery, the patient was transported to nursing in stable condition and monitored for possible acute effects.  Vital signs were recorded BP 107/53  Pulse 81  Temp(Src) 98.3 F (36.8 C) (Oral)  Resp 16  SpO2 98%. The patient tolerated treatment without significant acute effects, and was discharged to home in  stable condition.    PLAN: Follow-up in one month.  ________________________________  Sheral Apley. Tammi Klippel, M.D.

## 2014-03-29 NOTE — Op Note (Signed)
Name: Erika Nunez    MRN: [id number]   Date: 03/29/2014    DOB: October 31, 1950   STEREOTACTIC RADIOSURGERY OPERATIVE NOTE  PRE-OPERATIVE DIAGNOSIS:  Metastatic Breast CA to cerebellum  POST-OPERATIVE DIAGNOSIS:  Same  PROCEDURE:  Stereotactic Radiosurgery  SURGEON:  Consuella Lose, MD  RADIATION ONCOLOGIST: Dr. Tyler Pita, MD  TECHNIQUE:  The patient underwent a radiation treatment planning session in the radiation oncology simulation suite under the care of the radiation oncology physician and physicist.  I participated closely in the radiation treatment planning afterwards. The patient underwent planning CT which was fused to 3T high resolution MRI with 1 mm axial slices.  These images were fused on the planning system.  Radiation oncology contoured the gross target volume and subsequently expanded this to yield the Planning Target Volume. I actively participated in the planning process.  I helped to define and review the target contours and also the contours of the optic pathway, eyes, brainstem and selected nearby organs at risk.  All the dose constraints for critical structures were reviewed and compared to AAPM Task Group 101.  The prescription dose conformity was reviewed.  I approved the plan electronically.    Accordingly, Erika Nunez was brought to the TrueBeam stereotactic radiation treatment linac and placed in the custom immobilization mask.  The patient was aligned according to the IR fiducial markers with BrainLab Exactrac, then orthogonal x-rays were used in ExacTrac with the 6DOF robotic table and the shifts were made to align the patient  This lesion was complex because it was >3.5 cm.  Erika Nunez received stereotactic radiosurgery uneventfully to a prescription dose of 15Gy.  The detailed description of the procedure is recorded in the radiation oncology procedure note.  I was present for the duration of the procedure.  DISPOSITION:  Following delivery, the  patient was transported to nursing in stable condition and monitored for possible acute effects to be discharged to home in stable condition with follow-up in one month.  Consuella Lose, MD [facility]

## 2014-03-30 NOTE — Progress Notes (Signed)
  Radiation Oncology         [phone] ________________________________  Name: Erika Nunez MRN: [id number]  Date: 03/29/2014  DOB: [birth year]  End of Treatment Note  Diagnosis:   63 yo woman s/p resection of a 4.7 cm right cerebellar metastasis from metastatic ER negative HER-2 positive invasive lobular carcinoma of the upper outer right breast - stage IV  Indication for treatment:  Palliation       Radiation treatment dates:   03/29/2014  Site/dose/beams/energy:   Right cerebellar 4.7 cm resection cavity target was treated using 5 Dynamic Conformal Arcs to a prescription dose of 15 Gy. ExacTrac registration was performed for each couch angle. The 83.3% isodose line was prescribed.  6 MV X-rays were delivered in the flattening filter free beam mode.  Narrative: The patient tolerated radiation treatment relatively well.     Plan: The patient has completed radiation treatment. The patient will return to radiation oncology clinic for routine followup in one month. I advised them to call or return sooner if they have any questions or concerns related to their recovery or treatment. ________________________________  Sheral Apley. Tammi Klippel, M.D.

## 2014-04-01 ENCOUNTER — Encounter (HOSPITAL_COMMUNITY): Payer: Self-pay

## 2014-04-01 ENCOUNTER — Ambulatory Visit (HOSPITAL_COMMUNITY)
Admission: RE | Admit: 2014-04-01 | Discharge: 2014-04-01 | Disposition: A | Discharge status: Home / Self Care | Payer: Medicaid Other | Source: Ambulatory Visit | Attending: Oncology | Admitting: Oncology

## 2014-04-01 DIAGNOSIS — R6889 Other general symptoms and signs: Secondary | ICD-10-CM | POA: Insufficient documentation

## 2014-04-01 DIAGNOSIS — Z9010 Acquired absence of unspecified breast and nipple: Secondary | ICD-10-CM | POA: Diagnosis not present

## 2014-04-01 DIAGNOSIS — C50919 Malignant neoplasm of unspecified site of unspecified female breast: Secondary | ICD-10-CM | POA: Insufficient documentation

## 2014-04-01 DIAGNOSIS — C7931 Secondary malignant neoplasm of brain: Secondary | ICD-10-CM

## 2014-04-01 LAB — GLUCOSE, CAPILLARY: GLUCOSE-CAPILLARY: 108 mg/dL — AB (ref 70–99)

## 2014-04-01 MED ORDER — FLUDEOXYGLUCOSE F - 18 (FDG) INJECTION: 10.2300 | Freq: Once | INTRAVENOUS | Status: AC | PRN

## 2014-04-02 ENCOUNTER — Ambulatory Visit (HOSPITAL_COMMUNITY): Payer: Self-pay

## 2014-04-05 ENCOUNTER — Encounter (HOSPITAL_COMMUNITY): Payer: Self-pay

## 2014-04-08 ENCOUNTER — Encounter (HOSPITAL_BASED_OUTPATIENT_CLINIC_OR_DEPARTMENT_OTHER): Payer: Medicaid Other

## 2014-04-08 ENCOUNTER — Encounter (HOSPITAL_COMMUNITY): Payer: Self-pay

## 2014-04-08 ENCOUNTER — Encounter (HOSPITAL_COMMUNITY): Payer: Medicaid Other

## 2014-04-08 VITALS — BP 115/74 | HR 103 | Temp 98.3°F | Resp 20 | Wt 203.0 lb

## 2014-04-08 DIAGNOSIS — Z853 Personal history of malignant neoplasm of breast: Secondary | ICD-10-CM | POA: Diagnosis present

## 2014-04-08 DIAGNOSIS — T451X5A Adverse effect of antineoplastic and immunosuppressive drugs, initial encounter: Secondary | ICD-10-CM | POA: Diagnosis not present

## 2014-04-08 DIAGNOSIS — C50919 Malignant neoplasm of unspecified site of unspecified female breast: Secondary | ICD-10-CM | POA: Diagnosis present

## 2014-04-08 DIAGNOSIS — C50911 Malignant neoplasm of unspecified site of right female breast: Secondary | ICD-10-CM

## 2014-04-08 DIAGNOSIS — R222 Localized swelling, mass and lump, trunk: Secondary | ICD-10-CM

## 2014-04-08 DIAGNOSIS — C7949 Secondary malignant neoplasm of other parts of nervous system: Secondary | ICD-10-CM | POA: Diagnosis present

## 2014-04-08 DIAGNOSIS — C7931 Secondary malignant neoplasm of brain: Secondary | ICD-10-CM

## 2014-04-08 DIAGNOSIS — I890 Lymphedema, not elsewhere classified: Secondary | ICD-10-CM

## 2014-04-08 DIAGNOSIS — C792 Secondary malignant neoplasm of skin: Secondary | ICD-10-CM

## 2014-04-08 DIAGNOSIS — C799 Secondary malignant neoplasm of unspecified site: Secondary | ICD-10-CM

## 2014-04-08 LAB — CBC WITH DIFFERENTIAL/PLATELET
BASOS PCT: 0 % (ref 0–1)
Basophils Absolute: 0 10*3/uL (ref 0.0–0.1)
Eosinophils Absolute: 0 10*3/uL (ref 0.0–0.7)
Eosinophils Relative: 1 % (ref 0–5)
HCT: 33.7 % — ABNORMAL LOW (ref 36.0–46.0)
Hemoglobin: 11.7 g/dL — ABNORMAL LOW (ref 12.0–15.0)
Lymphocytes Relative: 15 % (ref 12–46)
Lymphs Abs: 1.2 10*3/uL (ref 0.7–4.0)
MCH: 34 pg (ref 26.0–34.0)
MCHC: 34.7 g/dL (ref 30.0–36.0)
MCV: 98 fL (ref 78.0–100.0)
MONOS PCT: 11 % (ref 3–12)
Monocytes Absolute: 0.9 10*3/uL (ref 0.1–1.0)
NEUTROS ABS: 6.1 10*3/uL (ref 1.7–7.7)
NEUTROS PCT: 73 % (ref 43–77)
PLATELETS: 367 10*3/uL (ref 150–400)
RBC: 3.44 MIL/uL — AB (ref 3.87–5.11)
RDW: 14.8 % (ref 11.5–15.5)
WBC: 8.3 10*3/uL (ref 4.0–10.5)

## 2014-04-08 LAB — COMPREHENSIVE METABOLIC PANEL
ALT: 16 U/L (ref 0–35)
ANION GAP: 17 — AB (ref 5–15)
AST: 17 U/L (ref 0–37)
Albumin: 4 g/dL (ref 3.5–5.2)
Alkaline Phosphatase: 66 U/L (ref 39–117)
BILIRUBIN TOTAL: 0.3 mg/dL (ref 0.3–1.2)
BUN: 25 mg/dL — AB (ref 6–23)
CHLORIDE: 102 meq/L (ref 96–112)
CO2: 21 mEq/L (ref 19–32)
Calcium: 9.8 mg/dL (ref 8.4–10.5)
Creatinine, Ser: 1.54 mg/dL — ABNORMAL HIGH (ref 0.50–1.10)
GFR calc Af Amer: 40 mL/min — ABNORMAL LOW (ref >90)
GFR calc non Af Amer: 35 mL/min — ABNORMAL LOW (ref >90)
Glucose, Bld: 81 mg/dL (ref 70–99)
Potassium: 4.7 mEq/L (ref 3.7–5.3)
SODIUM: 140 meq/L (ref 137–147)
Total Protein: 6.7 g/dL (ref 6.0–8.3)

## 2014-04-08 MED ORDER — HYDROCODONE-ACETAMINOPHEN 5-325 MG PO TABS: 1.0000 | ORAL_TABLET | ORAL | Status: DC | PRN

## 2014-04-08 MED ORDER — HEPARIN SOD (PORK) LOCK FLUSH 100 UNIT/ML IV SOLN
INTRAVENOUS | Status: AC
  Filled 2014-04-08: qty 5

## 2014-04-08 MED ORDER — SODIUM CHLORIDE 0.9 % IJ SOLN
10.0000 mL | INTRAMUSCULAR | Status: DC | PRN
  Administered 2014-04-08: 10 mL via INTRAVENOUS

## 2014-04-08 MED ORDER — HEPARIN SOD (PORK) LOCK FLUSH 100 UNIT/ML IV SOLN
500.0000 [IU] | Freq: Once | INTRAVENOUS | Status: AC
  Administered 2014-04-08: 500 [IU] via INTRAVENOUS

## 2014-04-08 NOTE — Progress Notes (Signed)
Erika Nunez presented for Portacath access and flush.  Portacath located left chest wall accessed with  H 20 needle.  Good blood return present. Portacath flushed with 55ml NS and 500U/72ml Heparin and needle removed intact.  Procedure tolerated well and without incident.

## 2014-04-08 NOTE — Progress Notes (Signed)
[facility] OFFICE PROGRESS NOTE  PCP [name], MD [name] [place] [address]  DIAGNOSIS: Breast CA, right - Plan: CBC with Differential, Comprehensive metabolic panel, CEA, Cancer antigen 27.29, heparin lock flush 100 unit/mL, sodium chloride 0.9 % injection 10 mL, HYDROcodone-acetaminophen (NORCO/VICODIN) 5-325 MG per tablet  History of left breast cancer - Plan: CBC with Differential, Comprehensive metabolic panel, CEA, Cancer antigen 27.29, heparin lock flush 100 unit/mL, sodium chloride 0.9 % injection 10 mL, HYDROcodone-acetaminophen (NORCO/VICODIN) 5-325 MG per tablet  Breast cancer metastasized to skin, right chest wall lateral/posteriorly Right Posterior Fossa Metastatic Carcinoma S/P Suboccipital Resection on 02/26/14 Stereotactic Radiosurgery to Tumor Bed on 03/29/14      CURRENT THERAPY: Anastrazole 1 mg daily  INTERVAL HEMATOLOGY/ONCOLOGY HX: TIASHA HELVIE is a 63 y.o. female who presented in January of 2014 with a Stage IV, Her2+, ER-, PR-, KI-67 70% locally advanced ulcerated right breast cancer. She also had a left breast mass which proved to be a lobular carcinoma with clinically positive axillary lymph nodes. She received 6 cycles of carbo and taxotere with pertuzumab and trastuzumab through 02/12/13, then bilateral mastectomies and chest wall radiotherapy. The right breast cancer was hormone receptor negative and the left sided cancer was hormone receptor positive. She returns to the cancer center to review the results of a recent PET scan.      The patient called the clinic on 03/18/2014 with a rash that is very painful and itching that did not cross the midline. As a result, she was started on Famvir for possible shingles outbreak and today's appointment was made to follow-up on the rash. Her rash is erythematous with a conglomeration of healed papules in a zosteriform pattern with a large conglomerate of papules  following T6/T7 dermatome. She admits that it itches and hurts. She was started on Famvir on 03/18/2014. She is to get that refilled for a complete 20 day course. She is also on Gabapentin for a separate issue so she is encouraged to continue with this medication for herpetic-induced discomfort. She was also given Hydrocodone on 7/6. She returns to the cancer center to review the results of a recent PET scan.      On 02/20/14, she presented with dizziness for 3 days. She states that started abruptly and persistent. She had nausea, and did throw up some. She was unable to walk without someone else helping her since this started on 02/17/14. MRI brain w/w/o demonstrated a 4x4.5cm right cerebellar hemisphere homogeneously enhancing mass with surrounding edema and effacement of the fourth ventricle. Due to early hydrocephalus, she underwent suboccipital craniectomy with tumor resection on 02/26/14 with Dr. Kathyrn Sheriff. Post-op MRI showed resection of th posterior right cerebellar mass with minimal to mild postoperative enhancement at the resection site except for an 8-10 mm nodular area along the anterior resection cavity. She was referred to Dr. Tammi Klippel for possible radiation treatment options. Ms. Egelston underwent Stereotactic treatment which she tolerated without adverse reactions. She has been tapered off her dexamethasone.     MEDICAL HISTORY:  Past Medical History  Diagnosis Date  . Hypertension   . High cholesterol   . Cancer   . Breast cancer   . Allergy     seasonal allergies  . Arthritis   . Anxiety   . Breast mass, left 2014  . Breast mass, right 2014  . 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 Depression   . Lymphedema of arm  right arm    has Breast cancer; Cellulitis of right upper extremity; Hypertension; Hypokalemia; Nausea with vomiting; Neutropenia; Anemia; Thrombocytopenia, unspecified; Antineoplastic chemotherapy induced pancytopenia; Lymphedema of upper  extremity following lymphadenectomy; Delayed postoperative wound closure; Brain mass; Obstructive hydrocephalus; Vasogenic brain edema; Cerebellar mass; Head ache; Metastatic cancer to brain; and Breast cancer metastasized to skin on her problem list.    ALLERGIES:  is allergic to codeine.  MEDICATIONS: has a current medication list which includes the following prescription(s): anastrozole, gabapentin, hydrocodone-acetaminophen, ibuprofen, lisinopril, lisinopril-hydrochlorothiazide, lorazepam, magnesium oxide, potassium chloride sa, pravastatin, lidocaine-prilocaine, and ondansetron, and the following Facility-Administered Medications: sodium chloride.  FAMILY HISTORY: family history includes COPD in her daughter; Cancer in her maternal aunt, maternal aunt, and maternal grandmother; Heart attack in her father; Stroke in her maternal uncle.  REVIEW OF SYSTEMS:    SINCE YOUR LAST VISIT Been diagnosed or treated for a new medical /surgical  problem or condition: No Any Recent Xrays or studies performed: Yes Any new prescription or OTC medications: No ECOG Perf Status: Restricted in physically strenuous activity but ambulatory and able to carry out work of a light or sedentary nature, e.g., light house work, office work Problems sleeping: No Medications taken to help sleep: Yes How is your appetie: 100% normal Any Supplements: No Any trouble chewing or swallowing: No Any Nausea or Vomiting: No Any Bowel problems: No # Bowel Movements per week: 7 Any Urinary Issues: No Any Cardiac Problems: No Any Respiratory Issues: No Any Neurological Issues: Yes (dizziness at times post cerebellar surgery) Do you live alone: No Feelings hopelessness: No You or your family have any concerns or Health changes: Yes (wants PET scan results) Pain Assessment Pain Score: 0-No pain except in the right lateral and posterior chest wall.  Other than that discussed above is noncontributory.    PHYSICAL  EXAMINATION:   weight is 203 lb (92.08 kg). Her oral temperature is 98.3 F (36.8 C). Her blood pressure is 115/74 and her pulse is 103. Her respiration is 20.    GENERAL: alert, no distress and mildly comfortable SKIN: skin color, texture, turgor are normal  EYES: PERLA; Conjunctiva are pink and non-injected, sclera clear OROPHARYNX: no exudate, no erythema on lips, buccal mucosa, or tongue. NECK: supple, thyroid normal size, non-tender, without nodularity. No masses LYMPH:  no palpable lymphadenopathy in the cervical, axillary or inguinal CHEST: Breasts are surgically absent. No visible skin changes anteriorly. Right lateral and posterior chest wall has a conglomeration of papules with associated tenderness. No vesicular component.      LUNGS: clear to auscultation and percussion with normal breathing effort HEART: regular rate & rhythm and no murmurs.  ABDOMEN: abdomen soft, non-tender and normal bowel sounds. No organomegaly.  MUSCULOSKELETAL/EXTREMITIES:  NEURO: alert & oriented x 3 with fluent speech, no focal motor/sensory deficits   LABORATORY DATA: Hospital Outpatient Visit on 04/01/2014  Component Date Value Ref Range Status  . Glucose-Capillary 04/01/2014 108* 70 - 99 mg/dL Final   CBC, CMP, CEA, and CA 27.29 are pending.   PATHOLOGY:ADDITIONAL INFORMATION: 2. CHROMOGENIC IN-SITU HYBRIDIZATION Results: HER-2/NEU BY CISH - NO AMPLIFICATION OF HER-2 DETECTED. RESULT RATIO OF HER2: CEP 17 SIGNALS 1.48 AVERAGE HER2 COPY NUMBER PER CELL 3.40 REFERENCE RANGE NEGATIVE HER2/Chr17 Ratio <2.0 and Average HER2 copy number <4.0 EQUIVOCAL HER2/Chr17 Ratio <2.0 and Average HER2 copy number 4.0 and <6.0 POSITIVE HER2/Chr17 Ratio >=2.0 and/or Average HER2 copy number >=6.0 Claudette Laws MD Pathologist, Electronic Signature ( Signed 03/08/2014) 2. PROGNOSTIC INDICATORS - ACIS Results:  IMMUNOHISTOCHEMICAL AND MORPHOMETRIC ANALYSIS BY THE AUTOMATED CELLULAR IMAGING SYSTEM  (ACIS) Estrogen Receptor: 0%, NEGATIVE Progesterone Receptor: 0%, NEGATIVE Proliferation Marker Ki67: 92% COMMENT: The negative hormone receptor study(ies) in this case have no internal positive control. REFERENCE RANGE ESTROGEN RECEPTOR NEGATIVE <1% POSITIVE =>1% PROGESTERONE RECEPTOR 1 of 3 FINAL for MARLOWE, CINQUEMANI L ([id number]) ADDITIONAL INFORMATION:(continued) NEGATIVE <1% POSITIVE =>1% All controls stained appropriatelyi Enid Cutter MD Pathologist, Electronic Signature ( Signed 03/01/2014) FINAL DIAGNOSIS Diagnosis 1. Brain, for tumor resection, Right cerebellar - METASTATIC CARCINOMA. - SEE COMMENT. 2. Brain, for tumor resection, Right cerebellar - METASTATIC CARCINOMA. - SEE COMMENT. 3. Brain, for tumor resection, Right cerebellar - METASTATIC CARCINOMA. - SEE COMMENT. Microscopic Comment 1. -3 Given the patient's history, the findings are consistent with metastatic breast carcinoma. A prognostic profile will be performed and the results reported separately. (JBK:gt, 02/27/14) Enid Cutter MD Pathologist, Electronic Signature (Case signed 02/27/2014)     RADIOGRAPHIC STUDIES: Mr Jeri Cos Wo Contrast  04-25-2014   CLINICAL DATA:  S RS targeting. Breast cancer with tumor resection 03/09/2014.  EXAM: MRI HEAD WITHOUT AND WITH CONTRAST  TECHNIQUE: Multiplanar, multiecho pulse sequences of the brain and surrounding structures were obtained without and with intravenous contrast.  CONTRAST:  14m MULTIHANCE GADOBENATE DIMEGLUMINE 529 MG/ML IV SOLN  COMPARISON:  02/26/2014.  02/20/2014.  FINDINGS: There has been contraction of the postoperative cavity in the right posterior fossa. Enhancing tissue along the anterior margin of the resection has become slightly more bulky. The largest and most easily measurable component measures approximately 17 x 9 x 9 mm, but I think this tissue is largely present along the anterior margin of the resection any somewhat lobular fashion. There  is less edema within the right cerebellum, but some vasogenic edema does persist. No mass effect.  No second more distant metastatic lesion is evident. I think there are a few slightly prominent leptomeningeal vessels in the right occipital lobe that were probably present previously and are not significant. No hydrocephalus. No other extra-axial collection. No pituitary mass. No inflammatory sinus disease. Small amount of fluid in the mastoid air cells, not significant. No skull or skullbase lesion.  IMPRESSION: Post resected space in the region of the right cerebellum is contracting as expected.  Enhancing tissue along the anterior margin of the resection is growing, consistent with residual tumor. This is largely present along the entire anterior margin of the resection cavity. The easiest measurable component measures approximately 17 x 9 x 9 mm. Mild right cerebellar edema.   Electronically Signed   By: MNelson ChimesM.D.   On: [id number]   Nm Pet Image Restag (ps) Skull Base To Thigh  04/01/2014   CLINICAL DATA:  Subsequent Treatment strategy for breast cancer.  EXAM: NUCLEAR MEDICINE PET SKULL BASE TO THIGH  TECHNIQUE: 10.2 mCi F-18 FDG was injected intravenously. Full-ring PET imaging was performed from the skull base to thigh after the radiotracer. CT data was obtained and used for attenuation correction and anatomic localization.  FASTING BLOOD GLUCOSE:  Value: 108 mg/dl  COMPARISON:  03/13/2013  FINDINGS: NECK  patient has had a occipital craniectomy on the right each. There is some hypermetabolism in the soft tissues overlying the craniectomy defect, presumably representing scar/granulation tissue from the surgery.  CHEST  Patient has had interval bilateral mastectomy. Low level, diffuse FDG uptake in the mastectomy dense is felt to be postsurgical. Left Port-A-Cath tip projects at the proximal SVC level, just distal to the innominate vein confluence. 8  x 19 mm soft tissue focus in the right  axilla was 32 x 14 mm previously. This area shows low level FDG uptake with SUV max = 2.3.  There is irregular skin thickening in the right lateral in post for lateral lower chest wall. This area is intensely hypermetabolic with SUV max = 6.8.  ABDOMEN/PELVIS  No abnormal hypermetabolic activity within the liver, pancreas, adrenal glands, or spleen. No hypermetabolic lymph nodes in the abdomen or pelvis.  There is fluid in the endometrial canal, abnormal in a postmenopausal female, but there is no associated hypermetabolism on the PET images.  SKELETON  No focal hypermetabolic activity to suggest skeletal metastasis.  IMPRESSION: Interval bilateral mastectomy with low level diffuse uptake in each mastectomy bed, likely postsurgical.  Small focus of soft tissue attenuation in the right axilla with borderline uptake on today's study. This may be granulation tissue/ scarring although metastatic disease to the right axilla was not completely excluded.  Marked hypermetabolism associated with the abnormal and irregular thickening of the skin over the mid and lower right lateral and post for lateral chest wall. This presumably correlates to the patient's recent diagnosis of shingles although it is unclear from the electronic medical record whether the shingles outbreak was on the right or the left.   Electronically Signed   By: Misty Stanley M.D.   On: 04/01/2014 10:31    ASSESSMENT: 1. Bilateral Breast Cancers S/P neoadjuvant chemotherapy, Mastectomies, and Radiation. Right Breast: Invasive Ductal, ER/PR Negative and FISH positive. Left Breast: Invasive Lobular, ER/PR positive, Her 2 negative by FISH 2. Right Cerebellar Metastasis 4.5 cm. Secondary to Breast Cancer: Hormone receptor negative and Her 2 negative. 3. Diffuse and Nodular Infiltration of right chest wall highly suspicious of metastatic disease.       4. Chronic Lymphedema right upper extremity      RECOMMENDATIONS: 1. Ms. Iacobucci has developed  progressive breast cancer while being treated with anastrazole. Discussed the PET scan and physical exam findings with the patient and her family. I recommended a core needle biopsy as soon as possible so that a tissue diagnosis can be confirmed along with  receptor studies and HER 2.  If the biopsy is positive, in light of her ongoing pain, she could be considered for palliative chest wall radiation, if the area is outside her previous radiation field. The patient has developed an aggressive recurrence of her breast cancers and alternatively,                 Dr. Barnet Glasgow may recommend deferring RT and proceeding with salvage chemotherapy. The patient will return to the cancer center after her biopsy.           The patient knows to call the clinic with any problems, questions or concerns.     Darrall Dears, MD 04/08/2014 1:03 PM

## 2014-04-08 NOTE — Progress Notes (Deleted)
Physical Exam

## 2014-04-08 NOTE — Patient Instructions (Signed)
[facility] Discharge Instructions  RECOMMENDATIONS MADE BY THE CONSULTANT AND ANY TEST RESULTS WILL BE SENT TO YOUR REFERRING PHYSICIAN.  Follow up as scheduled with Dr. Marlou Starks in [facility] for consultation and biopsy of right chest lesions. Return for follow-up office visit with Dr. Barnet Glasgow as scheduled.   Thank you for choosing [facility] to provide your oncology and hematology care.  To afford each patient quality time with our providers, please arrive at least 15 minutes before your scheduled appointment time.  With your help, our goal is to use those 15 minutes to complete the necessary work-up to ensure our physicians have the information they need to help with your evaluation and healthcare recommendations.    Effective January 1st, 2014, we ask that you re-schedule your appointment with our physicians should you arrive 10 or more minutes late for your appointment.  We strive to give you quality time with our providers, and arriving late affects you and other patients whose appointments are after yours.    Again, thank you for choosing [facility].  Our hope is that these requests will decrease the amount of time that you wait before being seen by our physicians.       _____________________________________________________________  Should you have questions after your visit to [facility], please contact our office at [phone] between the hours of 8:30 a.m. and 4:30 p.m.  Voicemails left after 4:30 p.m. will not be returned until the following business day.  For prescription refill requests, have your pharmacy contact our office with your prescription refill request.    _______________________________________________________________  We hope that we have given you very good care.  You may receive a patient satisfaction survey in the mail, please complete it and return it as soon as possible.  We value your  feedback!  _______________________________________________________________  Have you asked about our STAR program?  STAR stands for Survivorship Training and Rehabilitation, and this is a nationally recognized cancer care program that focuses on survivorship and rehabilitation.  Cancer and cancer treatments may cause problems, such as, pain, making you feel tired and keeping you from doing the things that you need or want to do. Cancer rehabilitation can help. Our goal is to reduce these troubling effects and help you have the best quality of life possible.  You may receive a survey from a nurse that asks questions about your current state of health.  Based on the survey results, all eligible patients will be referred to the [facility] program for an evaluation so we can better serve you!  A frequently asked questions sheet is available upon request.

## 2014-04-09 LAB — CEA: CEA: 0.6 ng/mL (ref 0.0–5.0)

## 2014-04-09 LAB — CANCER ANTIGEN 27.29: CA 27.29: 13 U/mL (ref 0–39)

## 2014-04-15 ENCOUNTER — Encounter (HOSPITAL_COMMUNITY): Payer: Self-pay

## 2014-04-15 ENCOUNTER — Ambulatory Visit (INDEPENDENT_AMBULATORY_CARE_PROVIDER_SITE_OTHER): Payer: Self-pay | Admitting: General Surgery

## 2014-04-15 ENCOUNTER — Encounter (INDEPENDENT_AMBULATORY_CARE_PROVIDER_SITE_OTHER): Payer: Self-pay | Admitting: General Surgery

## 2014-04-15 VITALS — BP 118/70 | HR 70 | Resp 18 | Ht 67.0 in | Wt 198.0 lb

## 2014-04-15 DIAGNOSIS — C50919 Malignant neoplasm of unspecified site of unspecified female breast: Secondary | ICD-10-CM

## 2014-04-15 DIAGNOSIS — C50911 Malignant neoplasm of unspecified site of right female breast: Secondary | ICD-10-CM

## 2014-04-15 HISTORY — PX: SKIN BIOPSY: SHX1

## 2014-04-15 NOTE — Addendum Note (Signed)
Addended by: Illene Regulus on: 04/15/2014 04:49 PM   Modules accepted: Orders

## 2014-04-15 NOTE — Patient Instructions (Signed)
May shower Tuesday Replace antibiotic ointment and bandaid everyday

## 2014-04-15 NOTE — Progress Notes (Signed)
Patient ID: Erika Nunez, female   DOB: March 24, 1951, 63 y.o.   MRN: [id number]  Chief Complaint  Patient presents with  . Other    Eval poss. recurrence in rt br/ treated for shingles 75mo ago and now wants bx    HPI Erika Nunez is a 63 y.o. female.  We're asked to see the patient in consultation by Dr. Dwaine Deter to evaluate her for her metastatic breast cancer. The patient is a 63 year old white female who initially presented with a large ulcerated right breast mass. She underwent neoadjuvant chemotherapy and subsequent bilateral mastectomy which showed no residual cancer in the breast. She did well for a while and then re\re presented with dizziness and was found to have a brain mass. This was removed and was found to be metastatic breast cancer. Over the last month she has developed a rash on her right chest wall which is painful. It was felt to be shingles and was treated appropriately but the Rash has not improved whatsoever. She presents today for biopsy of one of the nodules of her right chest wall. HPI  Past Medical History  Diagnosis Date  . Hypertension   . High cholesterol   . Cancer   . Breast cancer   . Allergy     seasonal allergies  . Arthritis   . Anxiety   . Breast mass, left 2014  . Breast mass, right 2014  . 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 Depression   . Lymphedema of arm     right arm    Past Surgical History  Procedure Laterality Date  . Cholecystectomy    . Tubal ligation    . Tonsillectomy    . Breast biopsy  10/02/2012    Procedure: BREAST BIOPSY;  Surgeon: Donato Heinz, MD;  Location: [facility];  Service: General;  Laterality: Right;  . Portacath placement Left 10/20/2012  . Breast biopsy Left 10/20/2012    Procedure: BREAST BIOPSY;  Surgeon: Donato Heinz, MD;  Location: [facility];  Service: General;  Laterality: Left;  Procedure end 1142  . Portacath placement Left 10/20/2012    Procedure: INSERTION PORT-A-CATH;  Surgeon:  Donato Heinz, MD;  Location: [facility];  Service: General;  Laterality: Left;  Procedure began 0973; left subclavian  . Simple mastectomy with axillary sentinel node biopsy Bilateral 04/25/2013    Procedure: SIMPLE MASTECTOMY;  Surgeon: Donato Heinz, MD;  Location: [facility];  Service: General;  Laterality: Bilateral;  . Craniotomy Right 02/26/2014    Procedure: suboccipital craniotomy for tumor resection;  Surgeon: Consuella Lose, MD;  Location: [facility];  Service: Neurosurgery;  Laterality: Right;  suboccipital craniotomy for tumor resection    Family History  Problem Relation Age of Onset  . Heart attack Father   . Cancer Maternal Aunt     colon cancer  . Stroke Maternal Uncle   . Cancer Maternal Grandmother     colon cancer  . Cancer Maternal Aunt     lung cancer  . COPD Daughter     Social History History  Substance Use Topics  . Smoking status: Never Smoker   . Smokeless tobacco: Never Used  . Alcohol Use: 0.6 oz/week    1 Glasses of wine per week    Allergies  Allergen Reactions  . Codeine Hives    Current Outpatient Prescriptions  Medication Sig Dispense Refill  . anastrozole (ARIMIDEX) 1 MG tablet Take 1 tablet (1 mg total) by mouth daily.  30 tablet  12  . gabapentin (NEURONTIN) 300 MG capsule Take 300 mg by mouth 2 (two) times daily.      Marland Kitchen HYDROcodone-acetaminophen (NORCO/VICODIN) 5-325 MG per tablet Take 1 tablet by mouth every 4 (four) hours as needed for moderate pain.  60 tablet  0  . ibuprofen (ADVIL,MOTRIN) 200 MG tablet Take 200 mg by mouth every 6 (six) hours as needed. Pain.      . lidocaine-prilocaine (EMLA) cream Apply topically once. Apply a quarter sized amount to port site 1 hour prior to chemo. Do not rub in. Cover with plastic.      Marland Kitchen lisinopril (PRINIVIL,ZESTRIL) 20 MG tablet Take 20 mg by mouth daily.      Marland Kitchen lisinopril-hydrochlorothiazide (PRINZIDE,ZESTORETIC) 20-25 MG per tablet Take 1 tablet by mouth daily.      Marland Kitchen LORazepam (ATIVAN) 1 MG  tablet Take 1 mg by mouth every 4 (four) hours as needed for anxiety.      . magnesium oxide (MAG-OX) 400 MG tablet Take 400 mg by mouth 3 (three) times daily.      . ondansetron (ZOFRAN) 8 MG tablet Take 8 mg by mouth. Starting the day after chemo, take 1 tablet in the am and 1 tablet in the pm for 2 days. Then may take 1 tablet two times a day IF needed for nausea/vomiting.      . potassium chloride SA (K-DUR,KLOR-CON) 20 MEQ tablet Take 40 mEq by mouth 3 (three) times daily.      . pravastatin (PRAVACHOL) 80 MG tablet Take 80 mg by mouth at bedtime.       No current facility-administered medications for this visit.    Review of Systems Review of Systems  Constitutional: Negative.   HENT: Negative.   Eyes: Negative.   Respiratory: Negative.   Cardiovascular: Negative.   Gastrointestinal: Negative.   Endocrine: Negative.   Genitourinary: Negative.   Musculoskeletal: Negative.   Skin: Positive for rash.  Allergic/Immunologic: Negative.   Neurological: Negative.   Hematological: Negative.   Psychiatric/Behavioral: Negative.     Blood pressure 118/70, pulse 70, resp. rate 18, height 5\' 7"  (1.702 m), weight 198 lb (89.812 kg).  Physical Exam Physical Exam  Constitutional: She is oriented to person, place, and time. She appears well-developed and well-nourished.  HENT:  Head: Normocephalic and atraumatic.  Eyes: Conjunctivae and EOM are normal. Pupils are equal, round, and reactive to light.  Neck: Normal range of motion. Neck supple.  Cardiovascular: Normal rate, regular rhythm and normal heart sounds.   Pulmonary/Chest: Effort normal and breath sounds normal.  There was a confluence of red firm nodules that are abundant on the right posterior lateral chest wall with some adjacent scattered nodules coming more laterally to the front of the chest wall on the right. These appeared to be isolated in the skin and subcutaneous tissue. One of these anteriorly was prepped with ChloraPrep.  The area was infiltrated with 1% lidocaine with epinephrine. One of these nodules was excised sharply with an 11 blade knife. The incision was then closed with 2 4-0 nylon stitches. A sterile dressing was applied. She tolerated the procedure well.  Abdominal: Soft. Bowel sounds are normal.  Musculoskeletal: Normal range of motion.  Lymphadenopathy:    She has no cervical adenopathy.  Neurological: She is alert and oriented to person, place, and time.  Skin: Skin is warm and dry.  Psychiatric: She has a normal mood and affect. Her behavior is normal.    Data Reviewed As  above  Assessment    The patient has known metastatic breast cancer and has a new mass like rash on her right chest wall. One of these nodules was biopsied.     Plan    I will plan to call her with the results of the biopsy. I will plan to see her back in one week to remove her stitches. She may begin showering tomorrow and continuing to cover the area with antibiotic ointment and a Band-Aid.        TOTH III,London Tarnowski S 04/15/2014, 4:06 PM

## 2014-04-16 ENCOUNTER — Telehealth (INDEPENDENT_AMBULATORY_CARE_PROVIDER_SITE_OTHER): Payer: Self-pay

## 2014-04-16 NOTE — Telephone Encounter (Signed)
Message copied by Carlene Coria on Tue Apr 16, 2014  2:36 PM ------      Message from: Salvatore Marvel      Created: Mon Apr 15, 2014  4:20 PM      Regarding: Dr. Toth/Next Week appt      Contact: [phone]       Patient was seen Today 04/15/14 by Dr. Marlou Starks and needs schedule f/u for  next week.      Please could you schedule appt.            Thank you. ------

## 2014-04-16 NOTE — Telephone Encounter (Signed)
LMOM with appt info 

## 2014-04-18 ENCOUNTER — Encounter (HOSPITAL_COMMUNITY): Payer: Self-pay | Admitting: Oncology

## 2014-04-19 ENCOUNTER — Telehealth (HOSPITAL_COMMUNITY): Payer: Self-pay | Admitting: Hematology and Oncology

## 2014-04-19 NOTE — Telephone Encounter (Signed)
See telephone message

## 2014-04-22 ENCOUNTER — Telehealth (HOSPITAL_COMMUNITY): Payer: Self-pay | Admitting: Hematology and Oncology

## 2014-04-22 ENCOUNTER — Encounter (HOSPITAL_COMMUNITY): Payer: Self-pay

## 2014-04-22 ENCOUNTER — Other Ambulatory Visit (HOSPITAL_COMMUNITY): Payer: Self-pay | Admitting: Hematology and Oncology

## 2014-04-22 DIAGNOSIS — C50911 Malignant neoplasm of unspecified site of right female breast: Secondary | ICD-10-CM

## 2014-04-22 NOTE — Patient Instructions (Addendum)
[facility]   CHEMOTHERAPY INSTRUCTIONS  Steward Drone is approved to treat HER2-positive breast cancer that has spread to other parts of the body (metastatic breast cancer) after prior treatment with trastuzumab (Herceptin) and a taxane. KADCYLA is designed to attach to HER2 receptors on the surface of cells. It then brings chemotherapy inside the cancer cells to help kill them.  The first infusion is given over 90 minutes. Subsequent infusions take 30 minutes if you tolerated the first one without difficulty. You take one treatment every 21 days. We will continue to do 2D Echoes or MUGA scans to make sure your heart muscle isn't weakening. Prior to each infusion of Kadcyla we will administer Tylenol 627m and Benadryl 553mby mouth. This is to decrease your risk of having an infusion reaction to the Kadcyla.   Most common side effects: Tiredness, Nausea, Pain that affects the bones, muscles, ligaments, and tendons,  Bleeding,  Low platelet count,  Headache,  Liver problems, Constipation,  Nosebleeds  What is the most important safety information I should know about KADCYLA?  Liver problems KADCYLA may cause severe liver problems that can be life-threatening. Symptoms of liver problems may include vomiting, nausea, eating disorder (anorexia), yellowing of the skin (jaundice), stomach pain, dark urine, or itching   Heart problems KADCYLA may cause heart problems, including those without symptoms (such as reduced heart function) and those with symptoms (such as congestive heart failure). Symptoms may include swelling of the ankles or legs, shortness of breath, cough, rapid weight gain of greater than 5 lbs in less than 24 hours, dizziness or loss of consciousness, or irregular heartbeat   What are the additional possible serious side effects of KADCYLA?  Not all people have serious side effects; however, side effects with KADCYLA treatment are common. It is important  to know what side effects may happen and what symptoms you should watch for.  Lung problems KADCYLA may cause lung problems, including inflammation of the lung tissue, which can be life-threatening. Signs of lung problems may include trouble breathing, cough, tiredness, and fluid in the lungs   Infusion-related reactions Symptoms of an infusion-related reaction may include one or more of the following: the skin getting hot or red (flushing), chills, fever, trouble breathing, low blood pressure, wheezing, tightening of the muscles in the chest around the airways, or a fast heartbeat. Your doctor will monitor you for infusion-related reactions   Serious bleeding KADCYLA can cause life-threatening bleeding. Taking KAFort Smithith other medications used to thin your blood (antiplatelet) or prevent blood clots (anticoagulation) can increase your risk of bleeding. Your doctor should provide additional monitoring if you are taking one of these other drugs while on KABloomingtonLife-threatening bleeding may also happen with KADCYLA even when blood thinners are not also being taken   Low platelet count Low platelet count may happen during treatment with KADCYLA. Platelets help your blood to clot. Signs of low platelets may include easy bruising, bleeding, and prolonged bleeding from cuts. In mild cases there may not be any symptoms   Nerve damage Symptoms may include numbness and tingling, burning or sharp pain, sensitivity to touch, lack of coordination, muscle weakness, or loss of muscle function  Other side effects that may occur: constipation, abdominal pain, mouth sores   EDUCATIONAL MATERIALS GIVEN AND REVIEWED: Specific Instructions Sheets: Kadcyla   SELF CARE ACTIVITIES WHILE ON CHEMOTHERAPY: Increase your fluid intake 48 hours prior to treatment and drink at least 2 quarts per day after  treatment., No alcohol intake., No aspirin or other medications unless approved by your oncologist., Eat foods  that are light and easy to digest., Eat foods at cold or room temperature., No fried, fatty, or spicy foods immediately before or after treatment., Have teeth cleaned professionally before starting treatment. Keep dentures and partial plates clean., Use soft toothbrush and do not use mouthwashes that contain alcohol. Biotene is a good mouthwash. Use warm salt water gargles (1 teaspoon salt per 1 quart warm water) before and after meals and at bedtime. Or you may rinse with 2 tablespoons of three -percent hydrogen peroxide mixed in eight ounces of water. and Always use sunscreen with SPF (Sun Protection Factor) of 30 or higher.  Please wash your hands for at least 30 seconds using warm soapy water. Handwashing is the #1 way to prevent the spread of germs. Stay away from sick people or people who are getting over a cold. If you develop respiratory systems such as green/yellow mucus production or productive cough or persistent cough let us know and we will see if you need an antibiotic. It is a good idea to keep a pair of gloves on when going into grocery stores/[employer] to decrease your risk of coming into contact with germs on the carts, etc. Carry alcohol hand gel with you at all times and use it frequently if out in public. All foods need to be cooked thoroughly. No raw foods. No medium or undercooked meats, eggs. If your food is cooked medium well, it does not need to be hot pink or saturated with bloody liquid at all. Vegetables and fruits need to be washed/rinsed under the faucet with a dish detergent before being consumed. You can eat raw fruits and vegetables unless we tell you otherwise but it would be best if you cooked them or bought frozen. Do not eat off of salad bars or hot bars unless you really trust the cleanliness of the restaurant. If you need dental work, please let us know before you go for your appointment so that we can coordinate the best possible time for you in regards to your chemo regimen.  You need to also let your dentist know that you are actively taking chemo. We may need to do labs prior to your dental appointment. We also want your bowels moving at least every other day. If this is not happening, we need to know so that we can get you on a bowel regimen to help you go.     MEDICATIONS: You have been given prescriptions for the following medications:  Over-the-Counter Meds:   Colace - this is a stool softener. Take 183m capsule 2-6 times a day as needed. If you have to take more than 6 capsules of Colace a day call the [facility]  Senna - this is a mild laxative used to treat mild constipation. May take 2 tabs by mouth daily or up to twice a day as needed for mild constipation.   Milk of Magnesia - this is a laxative used to treat moderate to severe constipation. May take 2-4 tablespoons every 8 hours as needed. May increase to 8 tablespoons x 1 dose and if no bowel movement call the [facility]  Imodium - this is for diarrhea. Take 2 tabs after 1st loose stool and then 1 tab after each loose stool until you go a total of 12 hours without a loose stool. Call [facility] loose stools continue.   SYMPTOMS TO REPORT AS SOON  AS POSSIBLE AFTER TREATMENT:  FEVER GREATER THAN 100.5 F  CHILLS WITH OR WITHOUT FEVER  NAUSEA AND VOMITING THAT IS NOT CONTROLLED WITH YOUR NAUSEA MEDICATION  UNUSUAL SHORTNESS OF BREATH  UNUSUAL BRUISING OR BLEEDING  TENDERNESS IN MOUTH AND THROAT WITH OR WITHOUT PRESENCE OF ULCERS  URINARY PROBLEMS  BOWEL PROBLEMS  UNUSUAL RASH    Wear comfortable clothing and clothing appropriate for easy access to any Portacath or PICC line. Let us know if there is anything that we can do to make your therapy better!      I have been informed and understand all of the instructions given to me and have received a copy. I have been instructed to call the clinic [phone] or my family physician as soon as possible for continued  medical care, if indicated. I do not have any more questions at this time but understand that I may call the [facility] or the Patient Navigator at [phone] during office hours should I have questions or need assistance in obtaining follow-up care.            Ado-Trastuzumab Emtansine for injection What is this medicine? Ado-trastuzumab emtansine (ADD oh traz TOO zuh mab em TAN zine) is a monoclonal antibody combined with chemotherapy. It targets a protein called HER2. This protein is found in some stomach and breast cancers. This medicine can stop cancer cell growth. This medicine may be used for other purposes; ask your health care provider or pharmacist if you have questions. COMMON BRAND NAME(S): Kadcyla What should I tell my health care provider before I take this medicine? They need to know if you have any of these conditions: -heart disease -heart failure -infection (especially a virus infection such as chickenpox, cold sores, or herpes) -liver disease -lung or breathing disease, like asthma -an unusual or allergic reaction to ado-trastuzumab emtansine, other medications, foods, dyes, or preservatives -pregnant or trying to get pregnant -breast-feeding How should I use this medicine? This medicine is for infusion into a vein. It is given by a health care professional in a hospital or clinic setting. Talk to your pediatrician regarding the use of this medicine in children. Special care may be needed. Overdosage: If you think you've taken too much of this medicine contact a poison control center or emergency room at once. Overdosage: If you think you have taken too much of this medicine contact a poison control center or emergency room at once. NOTE: This medicine is only for you. Do not share this medicine with others. What if I miss a dose? It is important not to miss your dose. Call your doctor or health care professional if you are unable to keep an  appointment. What may interact with this medicine? This medicine may also interact with the following medications: -atazanavir -boceprevir -clarithromycin -delavirdine -indinavir -dalfopristin; quinupristin -isoniazid, INH -itraconazole -ketoconazole -nefazodone -nelfinavir -ritonavir -telaprevir -telithromycin -tipranavir -voriconazole This list may not describe all possible interactions. Give your health care provider a list of all the medicines, herbs, non-prescription drugs, or dietary supplements you use. Also tell them if you smoke, drink alcohol, or use illegal drugs. Some items may interact with your medicine. What should I watch for while using this medicine? Visit your doctor for checks on your progress. This drug may make you feel generally unwell. This is not uncommon, as chemotherapy can affect healthy cells as well as cancer cells. Report any side effects. Continue your course of treatment even though you feel ill unless your  doctor tells you to stop. Call your doctor or health care professional for advice if you get a fever, chills or sore throat, or other symptoms of a cold or flu. Do not treat yourself. This drug decreases your body's ability to fight infections. Try to avoid being around people who are sick. Be careful brushing and flossing your teeth or using a toothpick because you may get an infection or bleed more easily. If you have any dental work done, tell your dentist you are receiving this medicine. Avoid taking products that contain aspirin, acetaminophen, ibuprofen, naproxen, or ketoprofen unless instructed by your doctor. These medicines may hide a fever. Do not become pregnant while taking this medicine. Women should inform their doctor if they wish to become pregnant or think they might be pregnant. There is a potential for serious side effects to an unborn child. [Men should inform their doctors if they wish to father a child. This medicine may lower sperm  counts.] Talk to your health care professional or pharmacist for more information. Do not breast-feed an infant while taking this medicine. You may need blood work done while you are taking this medicine. What side effects may I notice from receiving this medicine? Side effects that you should report to your doctor or health care professional as soon as possible: -allergic reactions like skin rash, itching or hives, swelling of the face, lips, or tongue -breathing problems -chest pain or palpitations -fever or chills, sore throat -general ill feeling or flu-like symptoms -light-colored stools -nausea, vomiting -pain, tingling, numbness in the hands or feet -signs and symptoms of bleeding such as bloody or black, tarry stools; red or dark-brown urine; spitting up blood or brown material that looks like coffee grounds; red spots on the skin; unusual bruising or bleeding from the eye, gums, or nose -swelling of the legs or ankles -yellowing of the eyes or skin Side effects that usually do not require medical attention (Report these to your doctor or health care professional if they continue or are bothersome.): -changes in taste -constipation -dizziness -headache -joint pain -muscle pain -trouble sleeping -unusually weak or tired This list may not describe all possible side effects. Call your doctor for medical advice about side effects. You may report side effects to FDA at 1-800-FDA-1088. Where should I keep my medicine? This drug is given in a hospital or clinic and will not be stored at home. NOTE: This sheet is a summary. It may not cover all possible information. If you have questions about this medicine, talk to your doctor, pharmacist, or health care provider.  2015, [employer]. (2013-01-09 12:55:10)

## 2014-04-22 NOTE — Telephone Encounter (Signed)
See telephone note.

## 2014-04-23 ENCOUNTER — Encounter (INDEPENDENT_AMBULATORY_CARE_PROVIDER_SITE_OTHER): Payer: Self-pay | Admitting: General Surgery

## 2014-04-24 ENCOUNTER — Encounter (HOSPITAL_COMMUNITY): Payer: Self-pay

## 2014-04-24 ENCOUNTER — Encounter (HOSPITAL_COMMUNITY): Payer: Medicaid Other

## 2014-04-24 ENCOUNTER — Encounter (HOSPITAL_COMMUNITY): Payer: Medicaid Other | Attending: Oncology

## 2014-04-24 ENCOUNTER — Other Ambulatory Visit (HOSPITAL_COMMUNITY): Payer: Self-pay

## 2014-04-24 VITALS — BP 112/74 | HR 108 | Temp 98.6°F | Resp 18 | Wt 198.5 lb

## 2014-04-24 DIAGNOSIS — C50919 Malignant neoplasm of unspecified site of unspecified female breast: Secondary | ICD-10-CM

## 2014-04-24 DIAGNOSIS — C7931 Secondary malignant neoplasm of brain: Secondary | ICD-10-CM

## 2014-04-24 DIAGNOSIS — C792 Secondary malignant neoplasm of skin: Secondary | ICD-10-CM | POA: Insufficient documentation

## 2014-04-24 DIAGNOSIS — IMO0001 Reserved for inherently not codable concepts without codable children: Secondary | ICD-10-CM

## 2014-04-24 DIAGNOSIS — C50912 Malignant neoplasm of unspecified site of left female breast: Secondary | ICD-10-CM

## 2014-04-24 DIAGNOSIS — C801 Malignant (primary) neoplasm, unspecified: Secondary | ICD-10-CM | POA: Insufficient documentation

## 2014-04-24 DIAGNOSIS — T451X5A Adverse effect of antineoplastic and immunosuppressive drugs, initial encounter: Secondary | ICD-10-CM | POA: Insufficient documentation

## 2014-04-24 DIAGNOSIS — C7949 Secondary malignant neoplasm of other parts of nervous system: Secondary | ICD-10-CM

## 2014-04-24 DIAGNOSIS — B029 Zoster without complications: Secondary | ICD-10-CM

## 2014-04-24 MED ORDER — OXYCODONE HCL 10 MG PO TABS: ORAL_TABLET | ORAL | Status: DC

## 2014-04-24 NOTE — Progress Notes (Signed)
[facility]  OFFICE PROGRESS NOTE  Erika Sax, MD [name] [place] [address]  DIAGNOSIS: No diagnosis found.  Chief Complaint  Patient presents with  . Breast Cancer    follow-up  . Brain and right chest wall metastases    CURRENT THERAPY: Anastrozole 1 mg daily. [place] treatment on 03/29/2014 to metastatic lesion in the brain. Biopsy right chest wall lesion.   INTERVAL HISTORY: Erika Nunez 63 y.o. 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 Postop adjuvant therapy with docetaxel, carboplatin, trastuzumab for  6 cycles followed by pertuzumab/trastuzumab combination antibody therapy started on 03/05/2013 for 12 cycles given at 3 week intervals and ending on 11/05/2013. Brain metastasis was diagnosed on 02/26/2014, resected, and treated with SRS on 03/29/2014, only 4 months after the conclusion of combined antibody therapy. This scenario qualifies the patient to receive Trastuzumab Emtansine (T-DM 1) as systemic treatment. Patient had undergone bilateral mastectomy ultimately on 04/25/2013. Panel left chest wall is 8/10 despite Vicodin 4 tablets daily. Bowel movements are regular. No cough, shortness of breath, PND, orthopnea, headache, ataxia, lower extremity swelling or redness, fever, night sweats, vaginal discharge or bleeding, nausea, vomiting, diarrhea, constipation, peripheral paresthesias, headache, or seizures.  MEDICAL HISTORY: Past Medical History  Diagnosis Date  . Hypertension   . High cholesterol   . Cancer   . Breast cancer   . Allergy     seasonal allergies  . Arthritis   . Anxiety   . Breast mass, left 2014  . Breast  mass, right 2014  . 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 Depression   . Lymphedema of arm     right arm    INTERIM HISTORY: has Breast cancer; Cellulitis of right upper extremity; Hypertension; Hypokalemia; Nausea with vomiting; Neutropenia; Anemia; Thrombocytopenia, unspecified; Antineoplastic chemotherapy induced pancytopenia; Lymphedema of upper extremity following lymphadenectomy; Delayed postoperative wound closure; Brain mass; Obstructive hydrocephalus; Vasogenic brain edema; Cerebellar mass; Head ache; Metastatic cancer to brain; and Metastasis from breast cancer on her problem list.    ALLERGIES:  is allergic to codeine and famvir.  MEDICATIONS: has a current medication list which includes the following prescription(s): anastrozole, gabapentin, hydrocodone-acetaminophen, ibuprofen, lisinopril, lisinopril-hydrochlorothiazide, lorazepam, magnesium oxide, ondansetron, potassium chloride sa, pravastatin, ado-trastuzumab emtansine, lidocaine-prilocaine, and oxycodone hcl.  SURGICAL HISTORY:  Past Surgical History  Procedure Laterality Date  . Cholecystectomy    . Tubal ligation    . Tonsillectomy    . Breast biopsy  10/02/2012    Procedure: BREAST BIOPSY;  Surgeon: Donato Heinz, MD;  Location: [facility];  Service: General;  Laterality: Right;  . Portacath placement Left 10/20/2012  . Breast biopsy Left 10/20/2012    Procedure: BREAST BIOPSY;  Surgeon: Donato Heinz, MD;  Location: [facility];  Service: General;  Laterality: Left;  Procedure end 1142  . Portacath placement Left 10/20/2012    Procedure: INSERTION PORT-A-CATH;  Surgeon: Donato Heinz, MD;  Location: [facility];  Service: General;  Laterality: Left;  Procedure began 6568; left subclavian  . Simple mastectomy with axillary sentinel node biopsy Bilateral 04/25/2013    Procedure: SIMPLE MASTECTOMY;  Surgeon: Donato Heinz, MD;  Location: [facility];  Service: General;  Laterality: Bilateral;  . Craniotomy Right  02/26/2014    Procedure: suboccipital craniotomy for tumor resection;  Surgeon: Consuella Lose, MD;  Location: [facility];  Service: Neurosurgery;  Laterality: Right;  suboccipital craniotomy for tumor resection  . Skin biopsy Right 04/15/2014    right lateral chest    FAMILY HISTORY: family history includes COPD in her daughter; Cancer in her maternal aunt, maternal aunt, and maternal grandmother; Heart attack in her father; Stroke in her maternal uncle.  SOCIAL HISTORY:  reports that she has never smoked. She has never used smokeless tobacco. She reports that she drinks about .6 ounces of alcohol per week. She reports that she does not use illicit drugs.  REVIEW OF SYSTEMS:  Other than that discussed above is noncontributory.  PHYSICAL EXAMINATION: ECOG PERFORMANCE STATUS: 1 - Symptomatic but completely ambulatory  Blood pressure 112/74, pulse 108, temperature 98.6 F (37 C), resp. rate 18, weight 198 lb 8 oz (90.039 kg), SpO2 98.00%.  GENERAL:alert, no distress and comfortable SKIN: skin color, texture, turgor are normal, no rashes or significant lesions EYES: PERLA; Conjunctiva are pink and non-injected, sclera clear SINUSES: No redness or tenderness over maxillary or ethmoid sinuses OROPHARYNX:no exudate, no erythema on lips, buccal mucosa, or tongue. NECK: supple, thyroid normal size, non-tender, without nodularity. No masses CHEST: Status post bilateral mastectomy with multiple subcutaneous nodules involving the right chest wall with areas of skin breakdown and eschar formation suggestive of previous zoster in addition to metastatic disease. LYMPH:  no palpable lymphadenopathy in the cervical, axillary or inguinal LUNGS: clear to auscultation and percussion with normal breathing effort HEART: regular rate & rhythm and no murmurs. ABDOMEN:abdomen soft, non-tender and normal bowel sounds MUSCULOSKELETAL:no cyanosis of digits and no clubbing. Range of motion normal.  NEURO: alert  & oriented x 3 with fluent speech, no focal motor/sensory deficits   LABORATORY DATA: Office Visit on 04/08/2014  Component Date Value Ref Range Status  . WBC 04/08/2014 8.3  4.0 - 10.5 K/uL Final  . RBC 04/08/2014 3.44* 3.87 - 5.11 MIL/uL Final  . Hemoglobin 04/08/2014 11.7* 12.0 - 15.0 g/dL Final  . HCT 04/08/2014 33.7* 36.0 - 46.0 % Final  . MCV 04/08/2014 98.0  78.0 - 100.0 fL Final  . MCH 04/08/2014 34.0  26.0 - 34.0 pg Final  . MCHC 04/08/2014 34.7  30.0 - 36.0 g/dL Final  . RDW 04/08/2014 14.8  11.5 - 15.5 % Final  . Platelets 04/08/2014 367  150 - 400 K/uL Final  . Neutrophils Relative % 04/08/2014 73  43 - 77 % Final  . Neutro Abs 04/08/2014 6.1  1.7 - 7.7 K/uL Final  . Lymphocytes Relative 04/08/2014 15  12 - 46 % Final  . Lymphs Abs 04/08/2014 1.2  0.7 - 4.0 K/uL Final  . Monocytes Relative 04/08/2014 11  3 - 12 % Final  . Monocytes Absolute 04/08/2014 0.9  0.1 - 1.0 K/uL Final  . Eosinophils Relative 04/08/2014 1  0 - 5 % Final  . Eosinophils Absolute 04/08/2014 0.0  0.0 - 0.7 K/uL Final  . Basophils Relative 04/08/2014 0  0 - 1 % Final  . Basophils Absolute 04/08/2014 0.0  0.0 - 0.1 K/uL Final  . Sodium 04/08/2014 140  137 - 147 mEq/L Final  . Potassium 04/08/2014 4.7  3.7 - 5.3 mEq/L Final  . Chloride 04/08/2014 102  96 - 112 mEq/L Final  . CO2 04/08/2014 21  19 - 32 mEq/L Final  . Glucose, Bld  04/08/2014 81  70 - 99 mg/dL Final  . BUN 04/08/2014 25* 6 - 23 mg/dL Final  . Creatinine, Ser 04/08/2014 1.54* 0.50 - 1.10 mg/dL Final  . Calcium 04/08/2014 9.8  8.4 - 10.5 mg/dL Final  . Total Protein 04/08/2014 6.7  6.0 - 8.3 g/dL Final  . Albumin 04/08/2014 4.0  3.5 - 5.2 g/dL Final  . AST 04/08/2014 17  0 - 37 U/L Final  . ALT 04/08/2014 16  0 - 35 U/L Final  . Alkaline Phosphatase 04/08/2014 66  39 - 117 U/L Final  . Total Bilirubin 04/08/2014 0.3  0.3 - 1.2 mg/dL Final  . GFR calc non Af Amer 04/08/2014 35* >90 mL/min Final  . GFR calc Af Amer 04/08/2014 40* >90  mL/min Final   Comment: (NOTE)                          The eGFR has been calculated using the CKD EPI equation.                          This calculation has not been validated in all clinical situations.                          eGFR's persistently <90 mL/min signify possible Chronic Kidney                          Disease.  . Anion gap 04/08/2014 17* 5 - 15 Final  . CEA 04/08/2014 0.6  0.0 - 5.0 ng/mL Final   Performed at [facility]  . CA 27.29 04/08/2014 13  0 - 39 U/mL Final   Performed at [facility] Outpatient Visit on 04/01/2014  Component Date Value Ref Range Status  . Glucose-Capillary 04/01/2014 108* 70 - 99 mg/dL Final    PATHOLOGY:  for VINETTE, CRITES ([id number]) Patient: JANAH, MCCULLOH Collected: 02/26/2014 Client: [place] Accession: [id number] Received: 02/26/2014 Consuella Lose, MD DOB: [birth year] Age: 59 Gender: F Reported: 02/27/2014 [address] Patient Ph: [phone] MRN #: [id number] [place], [place] [address] Visit #: [id number] Chart #: Phone: Fax: CC: Dyke Maes, DO REPORT OF SURGICAL PATHOLOGY ADDITIONAL INFORMATION: 2. CHROMOGENIC IN-SITU HYBRIDIZATION Results: HER-2/NEU BY CISH - NO AMPLIFICATION OF HER-2 DETECTED. RESULT RATIO OF HER2: CEP 17 SIGNALS 1.48 AVERAGE HER2 COPY NUMBER PER CELL 3.40 REFERENCE RANGE NEGATIVE HER2/Chr17 Ratio <2.0 and Average HER2 copy number <4.0 EQUIVOCAL HER2/Chr17 Ratio <2.0 and Average HER2 copy number 4.0 and <6.0 POSITIVE HER2/Chr17 Ratio >=2.0 and/or Average HER2 copy number >=6.0 Claudette Laws MD Pathologist, Electronic Signature ( Signed 03/08/2014) 2. PROGNOSTIC INDICATORS - ACIS Results: IMMUNOHISTOCHEMICAL AND MORPHOMETRIC ANALYSIS BY THE AUTOMATED CELLULAR IMAGING SYSTEM (ACIS) Estrogen Receptor: 0%, NEGATIVE Progesterone Receptor: 0%, NEGATIVE Proliferation Marker Ki67: 92% COMMENT: The negative hormone receptor study(ies) in  this case have no internal positive control. REFERENCE RANGE ESTROGEN RECEPTOR NEGATIVE <1% POSITIVE =>1% PROGESTERONE RECEPTOR 1 of 3 FINAL for BURNETTE, VALENTI L ([id number]) ADDITIONAL INFORMATION:(continued) NEGATIVE <1% POSITIVE =>1% All controls stained appropriatelyi Enid Cutter MD Pathologist, Electronic Signature ( Signed 03/01/2014) FINAL DIAGNOSIS Diagnosis 1. Brain, for tumor resection, Right cerebellar - METASTATIC CARCINOMA. - SEE COMMENT. 2. Brain, for tumor resection, Right cerebellar - METASTATIC CARCINOMA. - SEE COMMENT. 3. Brain, for tumor resection, Right cerebellar - METASTATIC CARCINOMA. - SEE  COMMENT. Microscopic Comment 1. -3 Given the patient's history, the findings are consistent with metastatic breast carcinoma. A prognostic profile will be performed and the results reported separately. (JBK:gt, 02/27/14) Enid Cutter MD Pathologist, Electronic Signature (Case signed 02/27/2014) Intraoperative Diagnosis 1. FROZEN SECTION DIAGNOSIS - RIGHT CEREBELLAR TUMOR: MALIGNANT FAVOR CARCINOMA. (JBK) Specimen Gross and Clinical Information Specimen(s) Obtained: 1. Brain, for tumor resection, Right cerebellar 2. Brain, for tumor resection, Right cerebellar 3. Brain, for tumor resection, Right cerebellar Specimen Clinical Information 1. Brain tumor (tl) 3. brain tumor (cm) Gross 1. The specimen is received fresh for rapid intraoperative consultation and consists of a 1.6 x 1.5 x 0.6 cm aggregate of tan-pink, focally hemorrhagic soft tissue. The specimen is entirely submitted for frozen section analysis, and the remnant is resubmitted for permanent in one cassette. (KL:ecj 02/26/2014) 2. The specimen is received in saline and consists of a 4.8 x 3.4 x 1.7 cm aggregate of tan-pink, focally 2 of 3 FINAL for Gulla, Kismet L ([id number]) Gross(continued) hemorrhagic softened tissue. The specimen is entirely submitted in ten cassettes. 3. The specimen is  received in saline and consists of a 4.5 x 3.5 x 1.1 cm aggregate of tan-red, focally hemorrhagic soft tissue. The specimen is entirely submitted in nine cassettes. (KL:ecj 02/26/2014) Disclaimer Her2Neu by CISH (chromogenic in-situ hybridization) is performed at [facility] Pathology, using the [place] pharmDx Kit (code number [id number]). This test is used to detect the amplification of the Her2Neu gene in interphase nuclei from formalin fixed, paraffin embedded tissue and is reported using ASCO/CAP scoring criteria published in 2013. PR progesterone receptor (PgR 636), immunohistochemical stains are performed on formalin fixed, paraffin embedded tissue using a 3,3"-diaminobenzidine (DAB) chromogen and DAKO Autostainer System. The staining intensity of the nucleus is scored morphometrically using the Automated Cellular Imaging System (ACIS) and is reported as the percentage of tumor cell nuclei demonstrating specific nuclear staining. Estrogen receptor (SP1), immunohistochemical stains are performed on formalin fixed, paraffin embedded tissue using a 3,3"-diaminobenzidine (DAB) chromogen and DAKO Autostainer System. The staining intensity of the nucleus is scored morphometrically using the Automated Cellular Imaging System (ACIS) and is reported as the percentage of tumor cell nuclei demonstrating specific nuclear staining. Ki-67 (Mib-1), immunohistochemical stains are performed on formalin fixed, paraffin embedded tissue using a 3,3"-diaminobenzidine (DAB) chromogen and [place]. The staining intensity of the nucleus is scored morphometrically using the Automated Cellular Imaging System (ACIS) and is reported as the percentage of tumor cell nuclei demonstrating specific nuclear staining. Report signed out from the following location(s) Associate Professor and Interpretation performed at [place], [place], [place] [address]. CLIA #:    for NELTA, CAUDILL ([id number]) Patient: SHAMERE, DILWORTH Collected: 04/15/2014 Client: [facility] Accession: [id number] Received: 04/15/2014 Sammuel Hines. Daiva Nakayama, MD DOB: [birth year] Age: 44 Gender: F Reported: 04/17/2014 [address], [place] Patient Ph: [phone] MRN #: [id number] [place], [place] [address] Client Acc#: Chart #: [id number] Phone: Fax: CC: Autumn Messing, MD Farrel Gobble, MD REPORT OF SURGICAL PATHOLOGY ADDITIONAL INFORMATION: CHROMOGENIC IN-SITU HYBRIDIZATION Results: HER2/NEU BY CISH - SHOWS AMPLIFICATION BY CISH ANALYSIS. RESULT RATIO OF HER2: CEP 17 SIGNALS 2.84 AVERAGE HER2 COPY NUMBER PER CELL 4.55 REFERENCE RANGE NEGATIVE HER2/Chr17 Ratio <2.0 and Average HER2 copy number <4.0 EQUIVOCAL HER2/Chr17 Ratio <2.0 and Average HER2 copy number 4.0 and <6.0 POSITIVE HER2/Chr17 Ratio >=2.0 and/or Average HER2 copy number >=6.0 Enid Cutter MD Pathologist, Electronic Signature ( Signed 04/22/2014) PROGNOSTIC INDICATORS - ACIS Results:  IMMUNOHISTOCHEMICAL AND MORPHOMETRIC ANALYSIS BY THE AUTOMATED CELLULAR IMAGING SYSTEM (ACIS) Estrogen Receptor: 2%, POSITIVE, WEAK STAINING INTENSITY Progesterone Receptor: 0%, NEGATIVE Proliferation Marker Ki67: 87% COMMENT: The negative hormone receptor study in this case has no internal positive control. REFERENCE RANGE ESTROGEN RECEPTOR NEGATIVE <1% POSITIVE =>1% 1 of 3 FINAL for GEROLDINE, ESQUIVIAS L [phone]) ADDITIONAL INFORMATION:(continued) PROGESTERONE RECEPTOR NEGATIVE <1% POSITIVE =>1% All controls stained appropriately Enid Cutter MD Pathologist, Electronic Signature ( Signed 04/19/2014) FINAL DIAGNOSIS Diagnosis Soft tissue mass, biopsy, right chest wall - INVASIVE CARCINOMA. - LYMPHOVASCULAR INVASION IS IDENTIFIED. - SEE COMMENT. Microscopic Comment Given the patient's history, the findings are consistent with grade II-III invasive ductal carcinoma. A prognostic profile will be performed and the  results reported separately. The case was discussed with Dr. Marlou Starks on 04/17/2014. (JBK:gt, 04/17/14) Enid Cutter MD Pathologist, Electronic Signature (Case signed 04/17/2014) Specimen Gross and Clinical Information Specimen Comment Nodule HX right br CA 2014 / bilateral masty Specimen(s) Obtained: Soft tissue mass, biopsy, right chest wall Gross In formalin is a 0.7 x 0.4 x 0.4 cm tan gray indurated tissue with possible overlying skin. The specimen is inked, sectioned, and entirely submitted in one block. (SW:ds 04/16/14) Stain(s) used in Diagnosis: The following stain(s) were used in diagnosing the case: ER-ACIS, PR-ACIS, CISH, KI-67-ACIS. The control(s) stained appropriately. Disclaimer PR progesterone receptor (PgR 636), immunohistochemical stains are performed on formalin fixed, paraffin embedded tissue using a 3,3"-diaminobenzidine (DAB) chromogen and DAKO Autostainer System. The staining intensity of the nucleus is scored morphometrically using the Automated Cellular Imaging System (ACIS) and is reported as the percentage of tumor cell nuclei demonstrating specific nuclear staining. Ki-67 (Mib-1), immunohistochemical stains are performed on formalin fixed, paraffin embedded tissue using a 3,3"-diaminobenzidine (DAB) chromogen and [place]. The staining intensity of the nucleus is scored morphometrically using the Automated Cellular Imaging System (ACIS) and is reported as the percentage of tumor cell nuclei demonstrating specific nuclear staining. Her2Neu by CISH (chromogenic in-situ hybridization) is performed at [facility], using the [place] pharmDx Kit (code number [id number]). This test is used to detect 2 of 3 FINAL for DAVEDA, LAROCK [phone]) Disclaimer(continued) the amplification of the Her2Neu gene in interphase nuclei from formalin fixed, paraffin embedded tissue and is reported using ASCO/CAP scoring criteria published in 2013. Estrogen receptor  (SP1), immunohistochemical stains are performed on formalin fixed, paraffin embedded tissue using a 3,3"-diaminobenzidine (DAB) chromogen and DAKO Autostainer System. The staining intensity of the nucleus is scored morphometrically using the Automated Cellular Imaging System (ACIS) and is reported as the percentage of tumor cell nuclei demonstrating specific nuclear staining. Report signed out from the following location(s) Technical Component performed at [facility]. [address],[place] [address].FOYD:[id number],HMC:[id number]., Interpretation performed at [place], [place],  [address]. CLIA #: [id number],    Urinalysis No results found for this basename: colorurine,  appearanceur,  labspec,  phurine,  glucoseu,  hgbur,  bilirubinur,  ketonesur,  proteinur,  urobilinogen,  nitrite,  leukocytesur    RADIOGRAPHIC STUDIES: Mr Kizzie Fantasia Contrast  2014-04-16   CLINICAL DATA:  S RS targeting. Breast cancer with tumor resection 03/09/2014.  EXAM: MRI HEAD WITHOUT AND WITH CONTRAST  TECHNIQUE: Multiplanar, multiecho pulse sequences of the brain and surrounding structures were obtained without and with intravenous contrast.  CONTRAST:  27m MULTIHANCE GADOBENATE DIMEGLUMINE 529 MG/ML IV SOLN  COMPARISON:  02/26/2014.  02/20/2014.  FINDINGS: There has been contraction of the postoperative cavity in the right posterior fossa. Enhancing  tissue along the anterior margin of the resection has become slightly more bulky. The largest and most easily measurable component measures approximately 17 x 9 x 9 mm, but I think this tissue is largely present along the anterior margin of the resection any somewhat lobular fashion. There is less edema within the right cerebellum, but some vasogenic edema does persist. No mass effect.  No second more distant metastatic lesion is evident. I think there are a few slightly prominent leptomeningeal vessels in the  right occipital lobe that were probably present previously and are not significant. No hydrocephalus. No other extra-axial collection. No pituitary mass. No inflammatory sinus disease. Small amount of fluid in the mastoid air cells, not significant. No skull or skullbase lesion.  IMPRESSION: Post resected space in the region of the right cerebellum is contracting as expected.  Enhancing tissue along the anterior margin of the resection is growing, consistent with residual tumor. This is largely present along the entire anterior margin of the resection cavity. The easiest measurable component measures approximately 17 x 9 x 9 mm. Mild right cerebellar edema.   Electronically Signed   By: Nelson Chimes M.D.   On: 03/27/2014 16:28   Nm Pet Image Restag (ps) Skull Base To Thigh  04/01/2014   CLINICAL DATA:  Subsequent Treatment strategy for breast cancer.  EXAM: NUCLEAR MEDICINE PET SKULL BASE TO THIGH  TECHNIQUE: 10.2 mCi F-18 FDG was injected intravenously. Full-ring PET imaging was performed from the skull base to thigh after the radiotracer. CT data was obtained and used for attenuation correction and anatomic localization.  FASTING BLOOD GLUCOSE:  Value: 108 mg/dl  COMPARISON:  03/13/2013  FINDINGS: NECK  patient has had a occipital craniectomy on the right each. There is some hypermetabolism in the soft tissues overlying the craniectomy defect, presumably representing scar/granulation tissue from the surgery.  CHEST  Patient has had interval bilateral mastectomy. Low level, diffuse FDG uptake in the mastectomy dense is felt to be postsurgical. Left Port-A-Cath tip projects at the proximal SVC level, just distal to the innominate vein confluence. 8 x 19 mm soft tissue focus in the right axilla was 32 x 14 mm previously. This area shows low level FDG uptake with SUV max = 2.3.  There is irregular skin thickening in the right lateral in post for lateral lower chest wall. This area is intensely hypermetabolic with  SUV max = 6.8.  ABDOMEN/PELVIS  No abnormal hypermetabolic activity within the liver, pancreas, adrenal glands, or spleen. No hypermetabolic lymph nodes in the abdomen or pelvis.  There is fluid in the endometrial canal, abnormal in a postmenopausal female, but there is no associated hypermetabolism on the PET images.  SKELETON  No focal hypermetabolic activity to suggest skeletal metastasis.  IMPRESSION: Interval bilateral mastectomy with low level diffuse uptake in each mastectomy bed, likely postsurgical.  Small focus of soft tissue attenuation in the right axilla with borderline uptake on today's study. This may be granulation tissue/ scarring although metastatic disease to the right axilla was not completely excluded.  Marked hypermetabolism associated with the abnormal and irregular thickening of the skin over the mid and lower right lateral and post for lateral chest wall. This presumably correlates to the patient's recent diagnosis of shingles although it is unclear from the electronic medical record whether the shingles outbreak was on the right or the left.   Electronically Signed   By: Misty Stanley M.D.   On: 04/01/2014 10:31    ASSESSMENT: Twice a day  3 #1. Stage IV breast cancer with brain and right chest wall metastases, brain lesion is ER/PR negative, HER-2/neu negative; right chest wall lesion is ER weakly positive, PR negative, HER-2/neu overexpressed, relapse chest wall 4 months at the conclusion of combination pertuzumab/trastuzumab and antibody therapy. 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 #2. Right thoracic herpes zoster together with metastatic disease. #3. Right upper extremity lymphedema, wearing a sleeve. #4. Peripheral neuropathy, on gabapentin.   PLAN:  #1. Oxycodone 10-20 mg every 2-4 hours to control pain. #2. Senokot S two to  four tablets  or milk of magnesia 60 cc by mouth every night to maintain bowel movements. #3. DC anastrozole. #4. MUGA scan on 04/26/2014. #5. Chemotherapy teaching today with plans to begin TDM-1 on 05/01/2014. #6. CBC, chem profile, CEA, CA 27-29 on 05/01/2014.   All questions were answered. The patient knows to call the clinic with any problems, questions or concerns. We can certainly see the patient much sooner if necessary.   I spent 40 minutes counseling the patient face to face. The total time spent in the appointment was 55 minutes.    Doroteo Bradford, MD 04/24/2014 3:17 PM  DISCLAIMER:  This note was dictated with voice recognition software.  Similar sounding words can inadvertently be transcribed inaccurately and may not be corrected upon review.

## 2014-04-24 NOTE — Progress Notes (Signed)
STAR Program Physical Impairment and Functional Assessment Screening Tool  1. Are you having any pain, including headaches, joint pain, or muscle pain (upper body = OT; lower body = PT)?  Yes, but I hand this before my cancer diagnosis.  2. Do your hands and/or feet feel numb or tingle (PT)?  Yes, this started after my diagnosis and is still a problem.  3. Does any part of your body feel swollen or larger than usual (upper body = OT; lower body = PT)?  No  4. Are you so tired that you cannot do the things you want or need to do (PT or OT)?  No  5. Are you feeling weak or are you having trouble moving any part of your body (PT/OT)?  No  6. Are you having trouble concentrating, thinking, or remembering things (OT/ST)?  No  7. Are you having trouble moving around or feel like you might trip or fall (PT)?  No  8. Are you having trouble swallowing (ST)?  No  9. Are you having trouble speaking (ST)?  No  10. Are you having trouble with going or getting to the bathroom (OT)?  No  11. Are you having trouble with your sexual function (OT)?  No  12. Are you having trouble lifting things, even just your arms (OT/PT)?  No  13. Are you having trouble taking care of yourself as in dressing or bathing (OT)?  No  14. Are you having trouble with daily tasks like chores or shopping (OT)?  No  15. Are you having trouble driving (OT)?  No  16. Are you having trouble returning to work or completing your tasks at work (OT)?  No  Other concerns:    Legend: OT = Occupational Therapy PT = Physical Therapy ST = Speech Therapy      

## 2014-04-24 NOTE — Addendum Note (Signed)
Addended by: Mellissa Kohut on: 04/24/2014 03:54 PM   Modules accepted: Orders

## 2014-04-24 NOTE — Progress Notes (Signed)
[employer] teaching done and consent signed. [employer] calendar given to patient. Distress screening done.

## 2014-04-24 NOTE — Patient Instructions (Addendum)
[facility] Discharge Instructions  RECOMMENDATIONS MADE BY THE CONSULTANT AND ANY TEST RESULTS WILL BE SENT TO YOUR REFERRING PHYSICIAN.  EXAM FINDINGS BY THE PHYSICIAN TODAY AND SIGNS OR SYMPTOMS TO REPORT TO CLINIC OR PRIMARY PHYSICIAN: Exam and findings as discussed by Dr. Barnet Glasgow.  Will start you on the new chemotherapy next week.  Report uncontrolled pain, nausea, vomiting or other concerns.  MEDICATIONS PRESCRIBED:  Stop the Anastrozole Oxycodone 10 mg take 1 or 2 every 4 hours as needed [place] S take 1 at bedtime Milk of Magnesia if needed to help with bowels.  INSTRUCTIONS/FOLLOW-UP: Follow-up as scheduled.  Thank you for choosing [facility] to provide your oncology and hematology care.  To afford each patient quality time with our providers, please arrive at least 15 minutes before your scheduled appointment time.  With your help, our goal is to use those 15 minutes to complete the necessary work-up to ensure our physicians have the information they need to help with your evaluation and healthcare recommendations.    Effective January 1st, 2014, we ask that you re-schedule your appointment with our physicians should you arrive 10 or more minutes late for your appointment.  We strive to give you quality time with our providers, and arriving late affects you and other patients whose appointments are after yours.    Again, thank you for choosing [facility].  Our hope is that these requests will decrease the amount of time that you wait before being seen by our physicians.       _____________________________________________________________  Should you have questions after your visit to [facility], please contact our office at [phone] between the hours of 8:30 a.m. and 4:30 p.m.  Voicemails left after 4:30 p.m. will not be returned until the following business day.  For prescription refill requests, have your  pharmacy contact our office with your prescription refill request.    _______________________________________________________________  We hope that we have given you very good care.  You may receive a patient satisfaction survey in the mail, please complete it and return it as soon as possible.  We value your feedback!  _______________________________________________________________  Have you asked about our STAR program?  STAR stands for Survivorship Training and Rehabilitation, and this is a nationally recognized cancer care program that focuses on survivorship and rehabilitation.  Cancer and cancer treatments may cause problems, such as, pain, making you feel tired and keeping you from doing the things that you need or want to do. Cancer rehabilitation can help. Our goal is to reduce these troubling effects and help you have the best quality of life possible.  You may receive a survey from a nurse that asks questions about your current state of health.  Based on the survey results, all eligible patients will be referred to the [facility] program for an evaluation so we can better serve you!  A frequently asked questions sheet is available upon request.

## 2014-04-26 ENCOUNTER — Encounter (HOSPITAL_COMMUNITY)
Admission: RE | Admit: 2014-04-26 | Discharge: 2014-04-26 | Disposition: A | Discharge status: Home / Self Care | Payer: Self-pay | Source: Ambulatory Visit | Attending: Hematology and Oncology | Admitting: Hematology and Oncology

## 2014-04-26 ENCOUNTER — Encounter (HOSPITAL_COMMUNITY): Payer: Self-pay

## 2014-04-26 DIAGNOSIS — C50919 Malignant neoplasm of unspecified site of unspecified female breast: Secondary | ICD-10-CM | POA: Insufficient documentation

## 2014-04-26 DIAGNOSIS — Z01818 Encounter for other preprocedural examination: Secondary | ICD-10-CM | POA: Insufficient documentation

## 2014-04-26 DIAGNOSIS — C50911 Malignant neoplasm of unspecified site of right female breast: Secondary | ICD-10-CM

## 2014-04-26 MED ORDER — TECHNETIUM TC 99M-LABELED RED BLOOD CELLS IV KIT
25.0000 | PACK | Freq: Once | INTRAVENOUS | Status: AC | PRN
  Administered 2014-04-26: 27.5 via INTRAVENOUS

## 2014-04-26 MED ORDER — HEPARIN SOD (PORK) LOCK FLUSH 100 UNIT/ML IV SOLN
INTRAVENOUS | Status: AC
Start: 2014-04-26 — End: 2014-04-26
  Administered 2014-04-26: 50 [IU] via INTRAVENOUS
  Filled 2014-04-26: qty 5

## 2014-04-28 ENCOUNTER — Encounter: Payer: Self-pay | Admitting: Radiation Oncology

## 2014-04-28 NOTE — Progress Notes (Signed)
  Radiation Oncology         [phone] ________________________________  Name: Erika Nunez MRN: [id number]  Date: 04/29/2014  DOB: [birth year]   Follow-Up Visit Note  NO SHOW

## 2014-04-29 ENCOUNTER — Ambulatory Visit
Admission: RE | Admit: 2014-04-29 | Discharge: 2014-04-29 | Disposition: A | Discharge status: Home / Self Care | Payer: Medicaid Other | Source: Ambulatory Visit | Attending: Radiation Oncology | Admitting: Radiation Oncology

## 2014-04-29 ENCOUNTER — Telehealth: Payer: Self-pay | Admitting: Radiation Oncology

## 2014-04-29 DIAGNOSIS — C7931 Secondary malignant neoplasm of brain: Secondary | ICD-10-CM

## 2014-04-29 NOTE — Telephone Encounter (Signed)
Patient no show for 0945 appointment. Phoned patient. No answer. Left message requesting return call to reschedule.

## 2014-05-01 ENCOUNTER — Encounter (HOSPITAL_BASED_OUTPATIENT_CLINIC_OR_DEPARTMENT_OTHER): Payer: Medicaid Other

## 2014-05-01 VITALS — BP 113/54 | HR 76 | Temp 98.9°F | Resp 18 | Wt 198.4 lb

## 2014-05-01 DIAGNOSIS — C50919 Malignant neoplasm of unspecified site of unspecified female breast: Secondary | ICD-10-CM

## 2014-05-01 DIAGNOSIS — C7931 Secondary malignant neoplasm of brain: Secondary | ICD-10-CM

## 2014-05-01 DIAGNOSIS — C799 Secondary malignant neoplasm of unspecified site: Secondary | ICD-10-CM

## 2014-05-01 DIAGNOSIS — C7949 Secondary malignant neoplasm of other parts of nervous system: Secondary | ICD-10-CM | POA: Diagnosis present

## 2014-05-01 DIAGNOSIS — C792 Secondary malignant neoplasm of skin: Secondary | ICD-10-CM

## 2014-05-01 DIAGNOSIS — Z5112 Encounter for antineoplastic immunotherapy: Secondary | ICD-10-CM

## 2014-05-01 DIAGNOSIS — C50911 Malignant neoplasm of unspecified site of right female breast: Secondary | ICD-10-CM

## 2014-05-01 DIAGNOSIS — T451X5A Adverse effect of antineoplastic and immunosuppressive drugs, initial encounter: Secondary | ICD-10-CM | POA: Diagnosis not present

## 2014-05-01 DIAGNOSIS — C801 Malignant (primary) neoplasm, unspecified: Secondary | ICD-10-CM | POA: Diagnosis present

## 2014-05-01 LAB — CBC WITH DIFFERENTIAL/PLATELET
Basophils Absolute: 0 10*3/uL (ref 0.0–0.1)
Basophils Relative: 0 % (ref 0–1)
EOS ABS: 0.1 10*3/uL (ref 0.0–0.7)
EOS PCT: 1 % (ref 0–5)
HCT: 30.7 % — ABNORMAL LOW (ref 36.0–46.0)
HEMOGLOBIN: 10.4 g/dL — AB (ref 12.0–15.0)
LYMPHS ABS: 0.9 10*3/uL (ref 0.7–4.0)
LYMPHS PCT: 13 % (ref 12–46)
MCH: 32.6 pg (ref 26.0–34.0)
MCHC: 33.9 g/dL (ref 30.0–36.0)
MCV: 96.2 fL (ref 78.0–100.0)
MONO ABS: 1 10*3/uL (ref 0.1–1.0)
MONOS PCT: 15 % — AB (ref 3–12)
Neutro Abs: 4.7 10*3/uL (ref 1.7–7.7)
Neutrophils Relative %: 71 % (ref 43–77)
PLATELETS: 254 10*3/uL (ref 150–400)
RBC: 3.19 MIL/uL — ABNORMAL LOW (ref 3.87–5.11)
RDW: 13.4 % (ref 11.5–15.5)
WBC: 6.6 10*3/uL (ref 4.0–10.5)

## 2014-05-01 LAB — COMPREHENSIVE METABOLIC PANEL
ALT: 10 U/L (ref 0–35)
ANION GAP: 16 — AB (ref 5–15)
AST: 12 U/L (ref 0–37)
Albumin: 3.6 g/dL (ref 3.5–5.2)
Alkaline Phosphatase: 67 U/L (ref 39–117)
BUN: 24 mg/dL — ABNORMAL HIGH (ref 6–23)
CO2: 26 meq/L (ref 19–32)
CREATININE: 1.53 mg/dL — AB (ref 0.50–1.10)
Calcium: 10.4 mg/dL (ref 8.4–10.5)
Chloride: 100 mEq/L (ref 96–112)
GFR calc Af Amer: 41 mL/min — ABNORMAL LOW (ref >90)
GFR, EST NON AFRICAN AMERICAN: 35 mL/min — AB (ref >90)
GLUCOSE: 87 mg/dL (ref 70–99)
Potassium: 4.3 mEq/L (ref 3.7–5.3)
SODIUM: 142 meq/L (ref 137–147)
Total Bilirubin: 0.4 mg/dL (ref 0.3–1.2)
Total Protein: 6.7 g/dL (ref 6.0–8.3)

## 2014-05-01 MED ORDER — SODIUM CHLORIDE 0.9 % IV SOLN
Freq: Once | INTRAVENOUS | Status: AC
  Administered 2014-05-01: 11:00:00 via INTRAVENOUS

## 2014-05-01 MED ORDER — HEPARIN SOD (PORK) LOCK FLUSH 100 UNIT/ML IV SOLN
500.0000 [IU] | Freq: Once | INTRAVENOUS | Status: AC | PRN
  Administered 2014-05-01: 500 [IU]
  Filled 2014-05-01: qty 5

## 2014-05-01 MED ORDER — SODIUM CHLORIDE 0.9 % IJ SOLN
10.0000 mL | INTRAMUSCULAR | Status: DC | PRN
  Administered 2014-05-01: 10 mL

## 2014-05-01 MED ORDER — SODIUM CHLORIDE 0.9 % IV SOLN
3.6000 mg/kg | Freq: Once | INTRAVENOUS | Status: AC
  Administered 2014-05-01: 320 mg via INTRAVENOUS
  Filled 2014-05-01: qty 16

## 2014-05-01 MED ORDER — ACETAMINOPHEN 325 MG PO TABS
650.0000 mg | ORAL_TABLET | Freq: Once | ORAL | Status: DC
  Filled 2014-05-01: qty 2

## 2014-05-01 MED ORDER — DIPHENHYDRAMINE HCL 25 MG PO CAPS
50.0000 mg | ORAL_CAPSULE | Freq: Once | ORAL | Status: DC
  Filled 2014-05-01: qty 2

## 2014-05-01 NOTE — Progress Notes (Signed)
Patient took pre-meds prior to arrival.

## 2014-05-01 NOTE — Progress Notes (Signed)
LABS DRAWN FOR CBCD,CMP,[id number],CEA,

## 2014-05-02 LAB — CANCER ANTIGEN 27.29: CA 27.29: 9 U/mL (ref 0–39)

## 2014-05-02 LAB — CEA: CEA: 0.6 ng/mL (ref 0.0–5.0)

## 2014-05-06 ENCOUNTER — Encounter (INDEPENDENT_AMBULATORY_CARE_PROVIDER_SITE_OTHER): Payer: Self-pay | Admitting: General Surgery

## 2014-05-07 ENCOUNTER — Encounter: Payer: Self-pay | Admitting: Radiation Oncology

## 2014-05-07 NOTE — Progress Notes (Signed)
  Radiation Oncology         [phone] ________________________________  Name: Erika Nunez MRN: [id number]  Date: 05/08/2014  DOB: Jan 15, 1951  [facility] Follow-Up Visit Note  No show

## 2014-05-08 ENCOUNTER — Telehealth: Payer: Self-pay | Admitting: Radiation Oncology

## 2014-05-08 ENCOUNTER — Ambulatory Visit
Admission: RE | Admit: 2014-05-08 | Discharge: 2014-05-08 | Disposition: A | Discharge status: Home / Self Care | Payer: Medicaid Other | Source: Ambulatory Visit | Attending: Radiation Oncology | Admitting: Radiation Oncology

## 2014-05-08 DIAGNOSIS — C7931 Secondary malignant neoplasm of brain: Secondary | ICD-10-CM

## 2014-05-08 NOTE — Telephone Encounter (Signed)
Patient did not show for follow up appointment. Phoned patient's home. No answer. Left message requesting return call to reschedule.

## 2014-05-09 ENCOUNTER — Encounter (HOSPITAL_COMMUNITY): Payer: Self-pay

## 2014-05-09 ENCOUNTER — Encounter (HOSPITAL_BASED_OUTPATIENT_CLINIC_OR_DEPARTMENT_OTHER): Payer: Medicaid Other

## 2014-05-09 VITALS — BP 110/70 | HR 118 | Temp 97.9°F | Resp 21 | Wt 197.4 lb

## 2014-05-09 DIAGNOSIS — C7949 Secondary malignant neoplasm of other parts of nervous system: Secondary | ICD-10-CM

## 2014-05-09 DIAGNOSIS — C7931 Secondary malignant neoplasm of brain: Secondary | ICD-10-CM

## 2014-05-09 DIAGNOSIS — IMO0001 Reserved for inherently not codable concepts without codable children: Secondary | ICD-10-CM

## 2014-05-09 DIAGNOSIS — C50919 Malignant neoplasm of unspecified site of unspecified female breast: Secondary | ICD-10-CM

## 2014-05-09 DIAGNOSIS — C792 Secondary malignant neoplasm of skin: Secondary | ICD-10-CM

## 2014-05-09 MED ORDER — POTASSIUM CHLORIDE CRYS ER 20 MEQ PO TBCR: 40.0000 meq | EXTENDED_RELEASE_TABLET | Freq: Three times a day (TID) | ORAL | Status: AC

## 2014-05-09 NOTE — Progress Notes (Signed)
[facility]  OFFICE PROGRESS NOTE  [name], MD [name] [place] [address]  DIAGNOSIS: Malignant neoplasm of breast (female), unspecified site - Plan: CBC with Differential, CBC with Differential, Comprehensive metabolic panel  Metastatic cancer to brain  Skin metastases, HER-2/neu positive  Chief Complaint  Patient presents with  . Breast Cancer    CURRENT THERAPY: TDM-1(Kadcyla) IV on 05/02/2014 for right chest wall recurrent HER-2/neu positive breast cancer. 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 Anastrozole was stopped 1 week ago. Brain recurrence was triple negative and chest wall recurrence was HER-2/neu positive, ER/PR negative. Brain recurrence occurred 4 months after conclusion of pertuzumab/trastuzumab given out to one year.   INTERVAL HISTORY: Erika Nunez 63 y.o. female returns for metastatic breast cancer to right chest wall and brain, brain lesion HER-2 negative, chest wall lesion HER-2 positive with original tumor diagnosed in January 2014 with original tumor 4 cm in size on 10/20/2012 with negative lymph nodes but with HER-2 positive overexpression. Postop adjuvant therapy with docetaxel, carboplatin, trastuzumab for 6 cycles followed by pertuzumab/trastuzumab combination antibody therapy started on 03/05/2013 for 12 cycles given at 3 week intervals and ending on 11/05/2013. Brain metastasis was diagnosed on 02/26/2014, resected, and treated with SRS on 03/29/2014, only 4 months after the conclusion of combined antibody therapy. This scenario qualifies the patient to receive Trastuzumab Emtansine (T-DM 1) as systemic treatment. Patient had undergone bilateral mastectomy ultimately  on 04/25/2013. Over the last 2 weeks she has had increase in anxiety with inability to sleep and with frequent crying spells. She finally got her prescription for Ativan filled and feels much better as a result. After receiving chemotherapy 1 week ago, she experienced rather intense pain involving the right chest wall which lasted about 6 hours and then to suddenly disappeared completely. The symptomatology was burning in character. It was not associated with any arm swelling, PND, orthopnea, or headache. She denies any epistaxis, melena, hematochezia, hematuria, or easy bruising. She also denies any diarrhea or other skin rash.    MEDICAL HISTORY: Past Medical History  Diagnosis Date  . Hypertension   . High cholesterol   . Cancer   . Breast cancer   . Allergy     seasonal allergies  . Arthritis   . Anxiety   . Breast mass, left 2014  . Breast mass, right 2014  . 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 Depression   . Lymphedema of arm     right arm    INTERIM HISTORY: has Breast cancer; Cellulitis of right upper extremity; Hypertension; Hypokalemia; Nausea with vomiting; Neutropenia; Anemia; Thrombocytopenia, unspecified; Antineoplastic chemotherapy induced pancytopenia; Lymphedema of upper extremity following lymphadenectomy; Delayed postoperative wound closure; Brain mass; Obstructive hydrocephalus; Vasogenic brain edema; Cerebellar mass; Head ache; Metastatic cancer to brain; and Metastasis from breast cancer on her problem list.    ALLERGIES:  is allergic to codeine and famvir.  MEDICATIONS: has a current medication list which includes the following prescription(s): ado-trastuzumab emtansine, gabapentin, ibuprofen, lidocaine-prilocaine, lisinopril, lisinopril-hydrochlorothiazide, lorazepam, magnesium oxide, ondansetron, oxycodone hcl, potassium chloride sa, pravastatin, sennosides-docusate sodium, anastrozole, and hydrocodone-acetaminophen.  SURGICAL HISTORY:    Past Surgical History  Procedure Laterality Date  . Cholecystectomy    .  Tubal ligation    . Tonsillectomy    . Breast biopsy  10/02/2012    Procedure: BREAST BIOPSY;  Surgeon: Donato Heinz, MD;  Location: [facility];  Service: General;  Laterality: Right;  . Portacath placement Left 10/20/2012  . Breast biopsy Left 10/20/2012    Procedure: BREAST BIOPSY;  Surgeon: Donato Heinz, MD;  Location: [facility];  Service: General;  Laterality: Left;  Procedure end 1142  . Portacath placement Left 10/20/2012    Procedure: INSERTION PORT-A-CATH;  Surgeon: Donato Heinz, MD;  Location: [facility];  Service: General;  Laterality: Left;  Procedure began 4401; left subclavian  . Simple mastectomy with axillary sentinel node biopsy Bilateral 04/25/2013    Procedure: SIMPLE MASTECTOMY;  Surgeon: Donato Heinz, MD;  Location: [facility];  Service: General;  Laterality: Bilateral;  . Craniotomy Right 02/26/2014    Procedure: suboccipital craniotomy for tumor resection;  Surgeon: Consuella Lose, MD;  Location: [facility];  Service: Neurosurgery;  Laterality: Right;  suboccipital craniotomy for tumor resection  . Skin biopsy Right 04/15/2014    right lateral chest    FAMILY HISTORY: family history includes COPD in her daughter; Cancer in her maternal aunt, maternal aunt, and maternal grandmother; Heart attack in her father; Stroke in her maternal uncle.  SOCIAL HISTORY:  reports that she has never smoked. She has never used smokeless tobacco. She reports that she drinks about .6 ounces of alcohol per week. She reports that she does not use illicit drugs.  REVIEW OF SYSTEMS:  Other than that discussed above is noncontributory.  PHYSICAL EXAMINATION: ECOG PERFORMANCE STATUS: 1 - Symptomatic but completely ambulatory  Blood pressure 110/70, pulse 118, temperature 97.9 F (36.6 C), temperature source Oral, resp. rate 21, weight 197 lb 6.4 oz (89.54 kg).  GENERAL:alert, no distress and comfortable SKIN: skin color,  texture, turgor are normal, no rashes or significant lesions EYES: PERLA; Conjunctiva are pink and non-injected, sclera clear SINUSES: No redness or tenderness over maxillary or ethmoid sinuses OROPHARYNX:no exudate, no erythema on lips, buccal mucosa, or tongue. NECK: supple, thyroid normal size, non-tender, without nodularity. No masses CHEST: Status post right mastectomy with innumerable subcutaneous nodules in addition to areas of eschar formation presumably due to previous herpes infection. Left chest wall is clear, status post mastectomy. LYMPH:  no palpable lymphadenopathy in the cervical, axillary or inguinal LUNGS: clear to auscultation and percussion with normal breathing effort HEART: regular rate & rhythm and no murmurs. ABDOMEN:abdomen soft, non-tender and normal bowel sounds MUSCULOSKELETAL:no cyanosis of digits and no clubbing. Range of motion normal.  NEURO: alert & oriented x 3 with fluent speech, no focal motor/sensory deficits   LABORATORY DATA: Infusion on 05/01/2014  Component Date Value Ref Range Status  . WBC 05/01/2014 6.6  4.0 - 10.5 K/uL Final  . RBC 05/01/2014 3.19* 3.87 - 5.11 MIL/uL Final  . Hemoglobin 05/01/2014 10.4* 12.0 - 15.0 g/dL Final  . HCT 05/01/2014 30.7* 36.0 - 46.0 % Final  . MCV 05/01/2014 96.2  78.0 - 100.0 fL Final  . MCH 05/01/2014 32.6  26.0 - 34.0 pg Final  . MCHC 05/01/2014 33.9  30.0 - 36.0 g/dL Final  . RDW 05/01/2014 13.4  11.5 - 15.5 % Final  . Platelets 05/01/2014 254  150 - 400 K/uL Final  . Neutrophils Relative % 05/01/2014 71  43 - 77 % Final  . Neutro Abs 05/01/2014 4.7  1.7 - 7.7 K/uL Final  . Lymphocytes Relative 05/01/2014 13  12 -  46 % Final  . Lymphs Abs 05/01/2014 0.9  0.7 - 4.0 K/uL Final  . Monocytes Relative 05/01/2014 15* 3 - 12 % Final  . Monocytes Absolute 05/01/2014 1.0  0.1 - 1.0 K/uL Final  . Eosinophils Relative 05/01/2014 1  0 - 5 % Final  . Eosinophils Absolute 05/01/2014 0.1  0.0 - 0.7 K/uL Final  .  Basophils Relative 05/01/2014 0  0 - 1 % Final  . Basophils Absolute 05/01/2014 0.0  0.0 - 0.1 K/uL Final  . Sodium 05/01/2014 142  137 - 147 mEq/L Final  . Potassium 05/01/2014 4.3  3.7 - 5.3 mEq/L Final  . Chloride 05/01/2014 100  96 - 112 mEq/L Final  . CO2 05/01/2014 26  19 - 32 mEq/L Final  . Glucose, Bld 05/01/2014 87  70 - 99 mg/dL Final  . BUN 05/01/2014 24* 6 - 23 mg/dL Final  . Creatinine, Ser 05/01/2014 1.53* 0.50 - 1.10 mg/dL Final  . Calcium 05/01/2014 10.4  8.4 - 10.5 mg/dL Final  . Total Protein 05/01/2014 6.7  6.0 - 8.3 g/dL Final  . Albumin 05/01/2014 3.6  3.5 - 5.2 g/dL Final  . AST 05/01/2014 12  0 - 37 U/L Final  . ALT 05/01/2014 10  0 - 35 U/L Final  . Alkaline Phosphatase 05/01/2014 67  39 - 117 U/L Final  . Total Bilirubin 05/01/2014 0.4  0.3 - 1.2 mg/dL Final  . GFR calc non Af Amer 05/01/2014 35* >90 mL/min Final  . GFR calc Af Amer 05/01/2014 41* >90 mL/min Final   Comment: (NOTE)                          The eGFR has been calculated using the CKD EPI equation.                          This calculation has not been validated in all clinical situations.                          eGFR's persistently <90 mL/min signify possible Chronic Kidney                          Disease.  . Anion gap 05/01/2014 16* 5 - 15 Final  . CA 27.29 05/01/2014 9  0 - 39 U/mL Final   Performed at [facility]  . CEA 05/01/2014 0.6  0.0 - 5.0 ng/mL Final   Performed at [facility]: Right chest wall biopsy is ER/PR negative, HER-2/neu overexpressed. Brain metastasis resected his triple negative. for AINO, HECKERT ([id number])  Patient: ARIZA, EVANS Collected: 02/26/2014 Client: [facility]  Accession: [id number] Received: 02/26/2014 Consuella Lose, MD  DOB: 1951/05/14 Age: 63 Gender: F Reported: 02/27/2014 [address]  Patient Ph: [phone] MRN #: [id number] [place],  [address]  Visit #: [id number] Chart #: Phone: Fax:    CC: Dyke Maes, DO  REPORT OF SURGICAL PATHOLOGY  ADDITIONAL INFORMATION:  2. CHROMOGENIC IN-SITU HYBRIDIZATION  Results:  HER-2/NEU BY CISH - NO AMPLIFICATION OF HER-2 DETECTED.  RESULT  RATIO OF HER2: CEP 17 SIGNALS 1.48  AVERAGE HER2 COPY NUMBER PER CELL 3.40  REFERENCE RANGE  NEGATIVE HER2/Chr17 Ratio <2.0 and Average HER2 copy number <4.0  EQUIVOCAL HER2/Chr17 Ratio <2.0 and Average HER2 copy number  4.0 and <6.0  POSITIVE HER2/Chr17 Ratio >=2.0 and/or Average HER2 copy number >=6.0  Claudette Laws MD  Pathologist, Electronic Signature  ( Signed 03/08/2014)  2. PROGNOSTIC INDICATORS - ACIS  Results:  IMMUNOHISTOCHEMICAL AND MORPHOMETRIC ANALYSIS BY THE AUTOMATED CELLULAR  IMAGING SYSTEM (ACIS)  Estrogen Receptor: 0%, NEGATIVE  Progesterone Receptor: 0%, NEGATIVE  Proliferation Marker Ki67: 92%  COMMENT: The negative hormone receptor study(ies) in this case have no internal positive control.  REFERENCE RANGE  ESTROGEN RECEPTOR  NEGATIVE <1%  POSITIVE =>1%  PROGESTERONE RECEPTOR  1 of 3  FINAL for HELAYNE, METSKER L ([id number])  ADDITIONAL INFORMATION:(continued)  NEGATIVE <1%  POSITIVE =>1%  All controls stained appropriatelyi  Enid Cutter MD  Pathologist, Electronic Signature  ( Signed 03/01/2014)  FINAL DIAGNOSIS  Diagnosis  1. Brain, for tumor resection, Right cerebellar  - METASTATIC CARCINOMA.  - SEE COMMENT.  2. Brain, for tumor resection, Right cerebellar  - METASTATIC CARCINOMA.  - SEE COMMENT.  3. Brain, for tumor resection, Right cerebellar  - METASTATIC CARCINOMA.  - SEE COMMENT.  Microscopic Comment  1. -3 Given the patient's history, the findings are consistent with metastatic breast carcinoma. A prognostic  profile will be performed and the results reported separately. (JBK:gt, 02/27/14)  Enid Cutter MD  Pathologist, Electronic Signature  (Case signed 02/27/2014)  Intraoperative Diagnosis  1. FROZEN SECTION DIAGNOSIS - RIGHT  CEREBELLAR TUMOR: MALIGNANT FAVOR CARCINOMA.  (JBK)  Specimen Gross and Clinical Information  Specimen(s) Obtained:  1. Brain, for tumor resection, Right cerebellar  2. Brain, for tumor resection, Right cerebellar  3. Brain, for tumor resection, Right cerebellar  Specimen Clinical Information  1. Brain tumor (tl)  3. brain tumor (cm)  Gross  1. The specimen is received fresh for rapid intraoperative consultation and consists of a 1.6 x 1.5 x 0.6 cm  aggregate of tan-pink, focally hemorrhagic soft tissue. The specimen is entirely submitted for frozen section  analysis, and the remnant is resubmitted for permanent in one cassette. (KL:ecj 02/26/2014)  2. The specimen is received in saline and consists of a 4.8 x 3.4 x 1.7 cm aggregate of tan-pink, focally  2 of 3  FINAL for Nebel, Kilynn L ([id number])  Gross(continued)  hemorrhagic softened tissue. The specimen is entirely submitted in ten cassettes.  3. The specimen is received in saline and consists of a 4.5 x 3.5 x 1.1 cm aggregate of tan-red, focally  hemorrhagic soft tissue. The specimen is entirely submitted in nine cassettes. (KL:ecj 02/26/2014)  Disclaimer  Her2Neu by CISH (chromogenic in-situ hybridization) is performed at [facility], using the [place] pharmDx Kit (code number [id number]). This test is used to detect  the amplification of the Her2Neu gene in interphase nuclei from formalin fixed, paraffin embedded tissue and is reported using ASCO/CAP scoring criteria published in 2013.  PR progesterone receptor (PgR 636), immunohistochemical stains are performed on formalin fixed, paraffin embedded tissue using a 3,3"-diaminobenzidine (DAB) chromogen  and DAKO Autostainer System. The staining intensity of the nucleus is scored morphometrically using the Automated Cellular Imaging System (ACIS) and is reported as the  percentage of tumor cell nuclei demonstrating specific nuclear staining.  Estrogen receptor (SP1),  immunohistochemical stains are performed on formalin fixed, paraffin embedded tissue using a 3,3"-diaminobenzidine (DAB) chromogen and DAKO  Autostainer System. The staining intensity of the nucleus is scored morphometrically using the Automated Cellular Imaging System (ACIS) and is reported as the percentage of  tumor cell nuclei demonstrating specific nuclear staining.  Ki-67 (Mib-1), immunohistochemical stains are  performed on formalin fixed, paraffin embedded tissue using a 3,3"-diaminobenzidine (DAB) chromogen and [place]. The staining intensity of the nucleus is scored morphometrically using the Automated Cellular Imaging System (ACIS) and is reported as the percentage of tumor cell  nuclei demonstrating specific nuclear staining.  Report signed out from the following location(s)  Associate Professor and Interpretation performed at [place], [address], [place] [address]. CLIA #:   for SHAKEIRA, RHEE ([id number])  Patient: JACKELYN, ILLINGWORTH Collected: 04/15/2014 Client: [facility]  Accession: [id number] Received: 04/15/2014 Sammuel Hines. Daiva Nakayama, MD  DOB: May 21, 1951 Age: 35 Gender: F Reported: 04/17/2014 [address], [place]  Patient Ph: [phone] MRN #: [id number] [place],  [address]  Client Acc#: Chart #: [id number] Phone: Fax:  CC: Autumn Messing, MD  Farrel Gobble, MD  REPORT OF SURGICAL PATHOLOGY  ADDITIONAL INFORMATION:  CHROMOGENIC IN-SITU HYBRIDIZATION  Results:  HER2/NEU BY CISH - SHOWS AMPLIFICATION BY CISH ANALYSIS.  RESULT  RATIO OF HER2: CEP 17 SIGNALS 2.84  AVERAGE HER2 COPY NUMBER PER CELL 4.55  REFERENCE RANGE  NEGATIVE HER2/Chr17 Ratio <2.0 and Average HER2 copy number <4.0  EQUIVOCAL HER2/Chr17 Ratio <2.0 and Average HER2 copy number 4.0 and <6.0  POSITIVE HER2/Chr17 Ratio >=2.0 and/or Average HER2 copy number >=6.0  Enid Cutter MD  Pathologist, Electronic Signature  ( Signed  04/22/2014)  PROGNOSTIC INDICATORS - ACIS  Results:  IMMUNOHISTOCHEMICAL AND MORPHOMETRIC ANALYSIS BY THE AUTOMATED CELLULAR  IMAGING SYSTEM (ACIS)  Estrogen Receptor: 2%, POSITIVE, WEAK STAINING INTENSITY  Progesterone Receptor: 0%, NEGATIVE  Proliferation Marker Ki67: 87%  COMMENT: The negative hormone receptor study in this case has no internal positive control.  REFERENCE RANGE  ESTROGEN RECEPTOR  NEGATIVE <1%  POSITIVE =>1%  1 of 3  FINAL for ALEXANDERIA, GORBY L ([phone])  ADDITIONAL INFORMATION:(continued)  PROGESTERONE RECEPTOR  NEGATIVE <1%  POSITIVE =>1%  All controls stained appropriately  Enid Cutter MD  Pathologist, Electronic Signature  ( Signed 04/19/2014)  FINAL DIAGNOSIS  Diagnosis  Soft tissue mass, biopsy, right chest wall  - INVASIVE CARCINOMA.  - LYMPHOVASCULAR INVASION IS IDENTIFIED.  - SEE COMMENT.  Microscopic Comment  Given the patient's history, the findings are consistent with grade II-III invasive ductal carcinoma. A prognostic  profile will be performed and the results reported separately. The case was discussed with Dr. Marlou Starks on  04/17/2014. (JBK:gt, 04/17/14)  Enid Cutter MD  Pathologist, Electronic Signature  (Case signed 04/17/2014)  Specimen Gross and Clinical Information  Specimen Comment  Nodule HX right br CA 2014 / bilateral masty  Specimen(s) Obtained:  Soft tissue mass, biopsy, right chest wall  Gross  In formalin is a 0.7 x 0.4 x 0.4 cm tan gray indurated tissue with possible overlying skin. The specimen is  inked, sectioned, and entirely submitted in one block. (SW:ds 04/16/14)  Stain(s) used in Diagnosis:  The following stain(s) were used in diagnosing the case: ER-ACIS, PR-ACIS, CISH, KI-67-ACIS. The  control(s) stained appropriately.  Disclaimer  PR progesterone receptor (PgR 636), immunohistochemical stains are performed on formalin fixed, paraffin embedded tissue using a 3,3"-diaminobenzidine (DAB) chromogen  and DAKO  Autostainer System. The staining intensity of the nucleus is scored morphometrically using the Automated Cellular Imaging System (ACIS) and is reported as the  percentage of tumor cell nuclei demonstrating specific nuclear staining.  Ki-67 (Mib-1), immunohistochemical stains are performed on formalin fixed, paraffin embedded tissue using a 3,3"-diaminobenzidine (DAB) chromogen and [place]. The  staining intensity of the nucleus is scored morphometrically using the Automated Cellular Imaging System (ACIS) and is reported as the percentage of tumor cell  nuclei demonstrating specific nuclear staining.  Her2Neu by CISH (chromogenic in-situ hybridization) is performed at [facility], using the [place] pharmDx Kit (code number [id number]). This test is used to detect  2 of 3  FINAL for WELLS, GERDEMAN ([phone])  Disclaimer(continued)  the amplification of the Her2Neu gene in interphase nuclei from formalin fixed, paraffin embedded tissue and is reported using ASCO/CAP scoring criteria published in 2013.  Estrogen receptor (SP1), immunohistochemical stains are performed on formalin fixed, paraffin embedded tissue using a 3,3"-diaminobenzidine (DAB) chromogen and DAKO  Autostainer System. The staining intensity of the nucleus is scored morphometrically using the Automated Cellular Imaging System (ACIS) and is reported as the percentage of  tumor cell nuclei demonstrating specific nuclear staining.  Report signed out from the following location(s)  Technical Component performed at [facility]. [address],[place] [id number].ENMM:[id number],[id number].,  Interpretation performed at [place],  [place], [place] [address]. CLIA #: [id number]  Urinalysis No results found for this basename: colorurine,  appearanceur,  labspec,  phurine,  glucoseu,  hgbur,  bilirubinur,  ketonesur,  proteinur,  urobilinogen,  nitrite,   leukocytesur    RADIOGRAPHIC STUDIES: Nm Cardiac Muga Rest  04/26/2014   CLINICAL DATA:  Breast cancer, pre cardiac toxic chemotherapy  EXAM: NUCLEAR MEDICINE CARDIAC BLOOD POOL IMAGING (MUGA)  TECHNIQUE: Cardiac multi-gated acquisition was performed at rest following intravenous injection of Tc-75mlabeled red blood cells.  RADIOPHARMACEUTICALS:  27.5 mCi Tc-918mertechnetate in-vitro labeled autologous red blood cells IV.  COMPARISON:  None  FINDINGS: Calculated LEFT ventricular ejection fraction is 81%, slightly elevated.  Wall motion analysis of the LEFT ventricle is normal.  IMPRESSION: Minimally elevated LEFT ventricular ejection fraction of 81% with normal LV wall motion.   Electronically Signed   By: MaLavonia Dana.D.   On: 04/26/2014 15:29    ASSESSMENT:  #1. Stage IV breast cancer with brain and right chest wall metastases, brain lesion is ER/PR negative, HER-2/neu negative; right chest wall lesion is ER weakly positive, PR negative, HER-2/neu overexpressed, relapse chest wall 4 months at the conclusion of combination pertuzumab/trastuzumab and antibody therapy. 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 Good tolerance of initial Kadcyla. #2. Right thoracic herpes zoster together with metastatic disease.  #3. Right upper extremity lymphedema, wearing a sleeve.  #4. Peripheral neuropathy, on gabapentin.       PLAN:  #1. Take Ativan on a regular basis. #2. Return in 2 weeks with CBC, chem profile, for cycle 2 of Kadcyla. #3. Patient was told to call should she develop fever or any bleeding or bruising.   All questions were answered. The patient knows to call the clinic with any problems, questions or concerns. We can certainly see the patient much sooner if necessary.   I spent 30 minutes counseling the patient face to face. The total time  spent in the appointment was 40 minutes.    FoDoroteo BradfordMD 05/09/2014 11:40 AM  DISCLAIMER:  This note was dictated with voice recognition software.  Similar sounding words can inadvertently be transcribed inaccurately and may not be corrected upon review.

## 2014-05-09 NOTE — Patient Instructions (Signed)
[place] Discharge Instructions  RECOMMENDATIONS MADE BY THE CONSULTANT AND ANY TEST RESULTS WILL BE SENT TO YOUR REFERRING PHYSICIAN.  EXAM FINDINGS BY THE PHYSICIAN TODAY AND SIGNS OR SYMPTOMS TO REPORT TO CLINIC OR PRIMARY PHYSICIAN: Exam and findings as discussed by Dr. Barnet Glasgow.  Thinks the discomfort at the tumor site after chemo may be effects of the chemotherapy on your cancer but is too soon to tell.  Report uncontrolled pain, fevers, increased shortness or breath or other problems.  MEDICATIONS PRESCRIBED:  Refills for potassium sent to your pharmacy.  INSTRUCTIONS/FOLLOW-UP: Follow-up in 2 weeks with next treatment.  Thank you for choosing [facility] to provide your oncology and hematology care.  To afford each patient quality time with our providers, please arrive at least 15 minutes before your scheduled appointment time.  With your help, our goal is to use those 15 minutes to complete the necessary work-up to ensure our physicians have the information they need to help with your evaluation and healthcare recommendations.    Effective January 1st, 2014, we ask that you re-schedule your appointment with our physicians should you arrive 10 or more minutes late for your appointment.  We strive to give you quality time with our providers, and arriving late affects you and other patients whose appointments are after yours.    Again, thank you for choosing [facility].  Our hope is that these requests will decrease the amount of time that you wait before being seen by our physicians.       _____________________________________________________________  Should you have questions after your visit to [facility], please contact our office at [phone] between the hours of 8:30 a.m. and 4:30 p.m.  Voicemails left after 4:30 p.m. will not be returned until the following business day.  For prescription refill requests, have  your pharmacy contact our office with your prescription refill request.    _______________________________________________________________  We hope that we have given you very good care.  You may receive a patient satisfaction survey in the mail, please complete it and return it as soon as possible.  We value your feedback!  _______________________________________________________________  Have you asked about our STAR program?  STAR stands for Survivorship Training and Rehabilitation, and this is a nationally recognized cancer care program that focuses on survivorship and rehabilitation.  Cancer and cancer treatments may cause problems, such as, pain, making you feel tired and keeping you from doing the things that you need or want to do. Cancer rehabilitation can help. Our goal is to reduce these troubling effects and help you have the best quality of life possible.  You may receive a survey from a nurse that asks questions about your current state of health.  Based on the survey results, all eligible patients will be referred to the [facility] program for an evaluation so we can better serve you!  A frequently asked questions sheet is available upon request.

## 2014-05-10 ENCOUNTER — Telehealth: Payer: Self-pay | Admitting: Radiation Oncology

## 2014-05-10 NOTE — Telephone Encounter (Signed)
Opened in error

## 2014-05-21 ENCOUNTER — Other Ambulatory Visit (HOSPITAL_COMMUNITY): Payer: Self-pay | Admitting: Oncology

## 2014-05-21 DIAGNOSIS — C7931 Secondary malignant neoplasm of brain: Secondary | ICD-10-CM

## 2014-05-21 DIAGNOSIS — R112 Nausea with vomiting, unspecified: Secondary | ICD-10-CM

## 2014-05-21 DIAGNOSIS — C799 Secondary malignant neoplasm of unspecified site: Secondary | ICD-10-CM

## 2014-05-21 DIAGNOSIS — C50919 Malignant neoplasm of unspecified site of unspecified female breast: Secondary | ICD-10-CM

## 2014-05-21 MED ORDER — LORAZEPAM 1 MG PO TABS: 1.0000 mg | ORAL_TABLET | ORAL | Status: DC | PRN

## 2014-05-22 ENCOUNTER — Encounter (HOSPITAL_COMMUNITY): Payer: Medicaid Other | Attending: Oncology

## 2014-05-22 ENCOUNTER — Encounter (HOSPITAL_BASED_OUTPATIENT_CLINIC_OR_DEPARTMENT_OTHER): Payer: Medicaid Other

## 2014-05-22 ENCOUNTER — Encounter: Payer: Self-pay | Admitting: *Deleted

## 2014-05-22 ENCOUNTER — Encounter (HOSPITAL_COMMUNITY): Payer: Self-pay

## 2014-05-22 VITALS — BP 107/50 | HR 84 | Temp 97.5°F | Resp 20 | Wt 195.2 lb

## 2014-05-22 VITALS — BP 106/52 | HR 75 | Temp 97.7°F | Resp 20 | Wt 195.2 lb

## 2014-05-22 DIAGNOSIS — C50911 Malignant neoplasm of unspecified site of right female breast: Secondary | ICD-10-CM

## 2014-05-22 DIAGNOSIS — C7931 Secondary malignant neoplasm of brain: Secondary | ICD-10-CM

## 2014-05-22 DIAGNOSIS — C50919 Malignant neoplasm of unspecified site of unspecified female breast: Secondary | ICD-10-CM | POA: Diagnosis not present

## 2014-05-22 DIAGNOSIS — IMO0001 Reserved for inherently not codable concepts without codable children: Secondary | ICD-10-CM

## 2014-05-22 DIAGNOSIS — C792 Secondary malignant neoplasm of skin: Secondary | ICD-10-CM | POA: Diagnosis present

## 2014-05-22 DIAGNOSIS — C7949 Secondary malignant neoplasm of other parts of nervous system: Secondary | ICD-10-CM

## 2014-05-22 DIAGNOSIS — C801 Malignant (primary) neoplasm, unspecified: Secondary | ICD-10-CM | POA: Insufficient documentation

## 2014-05-22 DIAGNOSIS — C773 Secondary and unspecified malignant neoplasm of axilla and upper limb lymph nodes: Secondary | ICD-10-CM

## 2014-05-22 DIAGNOSIS — Z5112 Encounter for antineoplastic immunotherapy: Secondary | ICD-10-CM

## 2014-05-22 DIAGNOSIS — C799 Secondary malignant neoplasm of unspecified site: Secondary | ICD-10-CM

## 2014-05-22 LAB — COMPREHENSIVE METABOLIC PANEL
ALK PHOS: 77 U/L (ref 39–117)
ALT: 18 U/L (ref 0–35)
ANION GAP: 15 (ref 5–15)
AST: 22 U/L (ref 0–37)
Albumin: 3.4 g/dL — ABNORMAL LOW (ref 3.5–5.2)
BUN: 19 mg/dL (ref 6–23)
CHLORIDE: 98 meq/L (ref 96–112)
CO2: 25 mEq/L (ref 19–32)
Calcium: 10.8 mg/dL — ABNORMAL HIGH (ref 8.4–10.5)
Creatinine, Ser: 1.23 mg/dL — ABNORMAL HIGH (ref 0.50–1.10)
GFR calc non Af Amer: 46 mL/min — ABNORMAL LOW (ref >90)
GFR, EST AFRICAN AMERICAN: 53 mL/min — AB (ref >90)
GLUCOSE: 99 mg/dL (ref 70–99)
POTASSIUM: 3.9 meq/L (ref 3.7–5.3)
Sodium: 138 mEq/L (ref 137–147)
Total Bilirubin: 0.3 mg/dL (ref 0.3–1.2)
Total Protein: 6.7 g/dL (ref 6.0–8.3)

## 2014-05-22 LAB — CBC WITH DIFFERENTIAL/PLATELET
Basophils Absolute: 0 10*3/uL (ref 0.0–0.1)
Basophils Relative: 0 % (ref 0–1)
Eosinophils Absolute: 0.1 10*3/uL (ref 0.0–0.7)
Eosinophils Relative: 1 % (ref 0–5)
HCT: 32.6 % — ABNORMAL LOW (ref 36.0–46.0)
HEMOGLOBIN: 11.2 g/dL — AB (ref 12.0–15.0)
LYMPHS ABS: 1.1 10*3/uL (ref 0.7–4.0)
Lymphocytes Relative: 12 % (ref 12–46)
MCH: 32.4 pg (ref 26.0–34.0)
MCHC: 34.4 g/dL (ref 30.0–36.0)
MCV: 94.2 fL (ref 78.0–100.0)
MONOS PCT: 9 % (ref 3–12)
Monocytes Absolute: 0.8 10*3/uL (ref 0.1–1.0)
NEUTROS ABS: 7.1 10*3/uL (ref 1.7–7.7)
Neutrophils Relative %: 78 % — ABNORMAL HIGH (ref 43–77)
Platelets: 289 10*3/uL (ref 150–400)
RBC: 3.46 MIL/uL — AB (ref 3.87–5.11)
RDW: 13.1 % (ref 11.5–15.5)
WBC: 9.1 10*3/uL (ref 4.0–10.5)

## 2014-05-22 MED ORDER — FENTANYL 50 MCG/HR TD PT72: 50.0000 ug | MEDICATED_PATCH | TRANSDERMAL | Status: DC

## 2014-05-22 MED ORDER — SODIUM CHLORIDE 0.9 % IJ SOLN
10.0000 mL | INTRAMUSCULAR | Status: DC | PRN
  Administered 2014-05-22: 10 mL

## 2014-05-22 MED ORDER — HEPARIN SOD (PORK) LOCK FLUSH 100 UNIT/ML IV SOLN
500.0000 [IU] | Freq: Once | INTRAVENOUS | Status: AC | PRN
  Administered 2014-05-22: 500 [IU]
  Filled 2014-05-22: qty 5

## 2014-05-22 MED ORDER — SODIUM CHLORIDE 0.9 % IV SOLN
3.6000 mg/kg | Freq: Once | INTRAVENOUS | Status: AC
  Administered 2014-05-22: 320 mg via INTRAVENOUS
  Filled 2014-05-22: qty 16

## 2014-05-22 MED ORDER — OXYCODONE HCL 10 MG PO TABS: ORAL_TABLET | ORAL | Status: DC

## 2014-05-22 MED ORDER — SODIUM CHLORIDE 0.9 % IV SOLN
Freq: Once | INTRAVENOUS | Status: AC
  Administered 2014-05-22: 11:00:00 via INTRAVENOUS

## 2014-05-22 NOTE — Progress Notes (Signed)
[place] Clinical Social Work  Clinical Social Work was referred by chart review for assessment of psychosocial needs due to missing a few appointments.  Clinical Social Worker met with patient and her daughter in the infusion room to introduce self, explain CSW role and to offer support and assess for needs.  Pt reports she got sick and had some complications that led her to miss a few appointments. Pt reports she has transportation and her daughter plans to help make appointments. Pt denied other concerns currently, but was appreciative of check in. CSW provided pt with CSW contact/role sheet. Pt agrees to seek assistance as needed.   Clinical Social Work interventions: CSW support education  Loren Racer, [place] Tuesdays 8:30-1pm Wednesdays 8:30-12pm  [phone]

## 2014-05-22 NOTE — Progress Notes (Signed)
Patient reports she took tylenol and benadryl just prior to today's appointment. Tolerated infusion well.

## 2014-05-22 NOTE — Progress Notes (Signed)
[place]  OFFICE PROGRESS NOTE  [name], MD [name] [place] [address]  DIAGNOSIS: Breast cancer, right  Metastatic cancer to brain  Skin metastases  Chief Complaint  Patient presents with  . Breast Cancer metastatic to brain and right chest wall    CURRENT THERAPY: Kadcyla started on 05/01/2014. 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 Anastrozole was stopped 1 week ago. Brain recurrence was triple negative and chest wall recurrence was HER-2/neu positive, ER/PR negative. Brain recurrence occurred 4 months after conclusion of pertuzumab/trastuzumab given out to one year.   INTERVAL HISTORY: Erika Nunez 63 y.o. female returns for followup and continuation of therapy with Kadcyla, to receive cycle #2 today, for metastatic breast cancer, HER-2/neu overexpressed involving the right chest wall, status post brain metastases, status post cerebellar resection followed by [facility] radiotherapy to the brain lesion area on 03/29/2014 receiving 15 Gy.  Right chest wall pain is requiring approximately 60 mg of oxycodone per 24 hours. Lesion; as with occasional weeping but no bleeding. She denies any headache, ataxia, nausea, vomiting, lower extremity swelling or redness, epistaxis, melena, hematochezia, hematuria, vaginal bleeding. Appetite has been good with no diarrhea, constipation, or incontinence.  MEDICAL HISTORY: Past Medical History  Diagnosis Date  . Hypertension   . High cholesterol   . Cancer   . Breast cancer   . Allergy     seasonal allergies  . Arthritis   . Anxiety   . Breast mass, left 2014  . Breast mass, right 2014  . Antineoplastic chemotherapy induced pancytopenia  01/04/2013  . Depression   . Lymphedema of arm     right arm    INTERIM HISTORY: has Breast cancer; Cellulitis of right upper extremity; Hypertension; Hypokalemia; Nausea with vomiting; Neutropenia; Anemia; Thrombocytopenia, unspecified; Antineoplastic chemotherapy induced pancytopenia; Lymphedema of upper extremity following lymphadenectomy; Delayed postoperative wound closure; Brain mass; Obstructive hydrocephalus; Vasogenic brain edema; Cerebellar mass; Head ache; Metastatic cancer to brain; and Metastasis from breast cancer on her problem list.   Metastatic breast cancer to right chest wall and brain, brain lesion HER-2 negative, chest wall lesion HER-2 positive with original tumor diagnosed in January 2014 with original tumor 4 cm in size on 10/20/2012 with negative lymph nodes but with HER-2 positive overexpression. Postop adjuvant therapy with docetaxel, carboplatin, trastuzumab for 6 cycles followed by pertuzumab/trastuzumab combination antibody therapy started on 03/05/2013 for 12 cycles given at 3 week intervals and ending on 11/05/2013. Brain metastasis was diagnosed on 02/26/2014, resected, and treated with SRS on 03/29/2014, only 4 months after the conclusion of combined antibody therapy. This scenario qualifies the patient to receive Trastuzumab Emtansine (T-DM 1) as systemic treatment started on 05/01/2014   ALLERGIES:  is allergic to codeine and famvir.  MEDICATIONS: has a current medication list which includes the following prescription(s): ado-trastuzumab emtansine, gabapentin, hydrocodone-acetaminophen, ibuprofen, lisinopril, lisinopril-hydrochlorothiazide, lorazepam, magnesium oxide, ondansetron, oxycodone hcl, potassium chloride sa, pravastatin, sennosides-docusate sodium, anastrozole, fentanyl, and lidocaine-prilocaine, and the following Facility-Administered Medications: heparin lock flush and sodium chloride.  SURGICAL HISTORY:  Past Surgical History  Procedure Laterality Date  .  Cholecystectomy    . Tubal ligation    . Tonsillectomy    . Breast biopsy  10/02/2012    Procedure: BREAST BIOPSY;  Surgeon: Donato Heinz, MD;  Location: [facility];  Service: General;  Laterality: Right;  . Portacath placement Left 10/20/2012  . Breast biopsy Left 10/20/2012    Procedure: BREAST BIOPSY;  Surgeon: Donato Heinz, MD;  Location: [facility];  Service: General;  Laterality: Left;  Procedure end 1142  . Portacath placement Left 10/20/2012    Procedure: INSERTION PORT-A-CATH;  Surgeon: Donato Heinz, MD;  Location: [facility];  Service: General;  Laterality: Left;  Procedure began 9678; left subclavian  . Simple mastectomy with axillary sentinel node biopsy Bilateral 04/25/2013    Procedure: SIMPLE MASTECTOMY;  Surgeon: Donato Heinz, MD;  Location: [facility];  Service: General;  Laterality: Bilateral;  . Craniotomy Right 02/26/2014    Procedure: suboccipital craniotomy for tumor resection;  Surgeon: Consuella Lose, MD;  Location: [facility];  Service: Neurosurgery;  Laterality: Right;  suboccipital craniotomy for tumor resection  . Skin biopsy Right 04/15/2014    right lateral chest    FAMILY HISTORY: family history includes COPD in her daughter; Cancer in her maternal aunt, maternal aunt, and maternal grandmother; Heart attack in her father; Stroke in her maternal uncle.  SOCIAL HISTORY:  reports that she has never smoked. She has never used smokeless tobacco. She reports that she drinks about .6 ounces of alcohol per week. She reports that she does not use illicit drugs.  REVIEW OF SYSTEMS:  Other than that discussed above is noncontributory.  PHYSICAL EXAMINATION: ECOG PERFORMANCE STATUS: 1 - Symptomatic but completely ambulatory  Blood pressure 106/52, pulse 75, temperature 97.7 F (36.5 C), temperature source Oral, resp. rate 20, weight 195 lb 3.2 oz (88.542 kg).  GENERAL:alert, no distress and comfortable SKIN: skin color, texture, turgor are normal, no rashes or significant  lesions EYES: PERLA; Conjunctiva are pink and non-injected, sclera clear SINUSES: No redness or tenderness over maxillary or ethmoid sinuses OROPHARYNX:no exudate, no erythema on lips, buccal mucosa, or tongue. NECK: supple, thyroid normal size, non-tender, without nodularity. No masses CHEST: Right chest wall covered with a carpet of colon seeing subcutaneous nodules with some scab formation in the midportion. No evidence of hemorrhage. Left breast without mass. LYMPH:  no palpable lymphadenopathy in the cervical, axillary or inguinal LUNGS: clear to auscultation and percussion with normal breathing effort HEART: regular rate & rhythm and no murmurs. ABDOMEN:abdomen soft, non-tender and normal bowel sounds MUSCULOSKELETAL:no cyanosis of digits and no clubbing. Range of motion normal. Right upper extremity lymphedema with sleeve in place. NEURO: alert & oriented x 3 with fluent speech, no focal motor/sensory deficits   LABORATORY DATA: Infusion on 05/22/2014  Component Date Value Ref Range Status  . WBC 05/22/2014 9.1  4.0 - 10.5 K/uL Final  . RBC 05/22/2014 3.46* 3.87 - 5.11 MIL/uL Final  . Hemoglobin 05/22/2014 11.2* 12.0 - 15.0 g/dL Final  . HCT 05/22/2014 32.6* 36.0 - 46.0 % Final  . MCV 05/22/2014 94.2  78.0 - 100.0 fL Final  . MCH 05/22/2014 32.4  26.0 - 34.0 pg Final  . MCHC 05/22/2014 34.4  30.0 - 36.0 g/dL Final  . RDW 05/22/2014 13.1  11.5 - 15.5 % Final  . Platelets 05/22/2014 289  150 - 400 K/uL Final  . Neutrophils Relative % 05/22/2014 78* 43 - 77 % Final  . Neutro Abs 05/22/2014 7.1  1.7 - 7.7 K/uL Final  . Lymphocytes Relative 05/22/2014 12  12 - 46 % Final  . Lymphs Abs 05/22/2014 1.1  0.7 - 4.0 K/uL Final  . Monocytes Relative 05/22/2014 9  3 - 12 % Final  . Monocytes Absolute 05/22/2014 0.8  0.1 - 1.0 K/uL Final  . Eosinophils Relative 05/22/2014 1  0 - 5 % Final  . Eosinophils Absolute 05/22/2014 0.1  0.0 - 0.7 K/uL Final  . Basophils Relative 05/22/2014 0  0 - 1  % Final  . Basophils Absolute 05/22/2014 0.0  0.0 - 0.1 K/uL Final  . Sodium 05/22/2014 138  137 - 147 mEq/L Final  . Potassium 05/22/2014 3.9  3.7 - 5.3 mEq/L Final  . Chloride 05/22/2014 98  96 - 112 mEq/L Final  . CO2 05/22/2014 25  19 - 32 mEq/L Final  . Glucose, Bld 05/22/2014 99  70 - 99 mg/dL Final  . BUN 05/22/2014 19  6 - 23 mg/dL Final  . Creatinine, Ser 05/22/2014 1.23* 0.50 - 1.10 mg/dL Final  . Calcium 05/22/2014 10.8* 8.4 - 10.5 mg/dL Final  . Total Protein 05/22/2014 6.7  6.0 - 8.3 g/dL Final  . Albumin 05/22/2014 3.4* 3.5 - 5.2 g/dL Final  . AST 05/22/2014 22  0 - 37 U/L Final  . ALT 05/22/2014 18  0 - 35 U/L Final  . Alkaline Phosphatase 05/22/2014 77  39 - 117 U/L Final  . Total Bilirubin 05/22/2014 0.3  0.3 - 1.2 mg/dL Final  . GFR calc non Af Amer 05/22/2014 46* >90 mL/min Final  . GFR calc Af Amer 05/22/2014 53* >90 mL/min Final   Comment: (NOTE)                          The eGFR has been calculated using the CKD EPI equation.                          This calculation has not been validated in all clinical situations.                          eGFR's persistently <90 mL/min signify possible Chronic Kidney                          Disease.  . Anion gap 05/22/2014 15  5 - 15 Final  Infusion on 05/01/2014  Component Date Value Ref Range Status  . WBC 05/01/2014 6.6  4.0 - 10.5 K/uL Final  . RBC 05/01/2014 3.19* 3.87 - 5.11 MIL/uL Final  . Hemoglobin 05/01/2014 10.4* 12.0 - 15.0 g/dL Final  . HCT 05/01/2014 30.7* 36.0 - 46.0 % Final  . MCV 05/01/2014 96.2  78.0 - 100.0 fL Final  . MCH 05/01/2014 32.6  26.0 - 34.0 pg Final  . MCHC 05/01/2014 33.9  30.0 - 36.0 g/dL Final  . RDW 05/01/2014 13.4  11.5 - 15.5 % Final  . Platelets 05/01/2014 254  150 - 400 K/uL Final  . Neutrophils Relative % 05/01/2014 71  43 - 77 % Final  . Neutro Abs 05/01/2014 4.7  1.7 - 7.7 K/uL Final  . Lymphocytes Relative 05/01/2014 13  12 - 46 % Final  . Lymphs Abs 05/01/2014 0.9  0.7 - 4.0  K/uL Final  . Monocytes Relative 05/01/2014 15* 3 - 12 % Final  . Monocytes Absolute 05/01/2014 1.0  0.1 - 1.0 K/uL Final  . Eosinophils Relative 05/01/2014 1  0 - 5 % Final  . Eosinophils Absolute 05/01/2014 0.1  0.0 - 0.7 K/uL Final  . Basophils Relative 05/01/2014 0  0 - 1 % Final  . Basophils Absolute 05/01/2014 0.0  0.0 - 0.1 K/uL Final  . Sodium 05/01/2014 142  137 - 147 mEq/L Final  . Potassium 05/01/2014 4.3  3.7 - 5.3 mEq/L Final  . Chloride 05/01/2014 100  96 - 112 mEq/L Final  . CO2 05/01/2014 26  19 - 32 mEq/L Final  . Glucose, Bld 05/01/2014 87  70 - 99 mg/dL Final  . BUN 05/01/2014 24* 6 - 23 mg/dL Final  . Creatinine, Ser 05/01/2014 1.53* 0.50 - 1.10 mg/dL Final  . Calcium 05/01/2014 10.4  8.4 - 10.5 mg/dL Final  . Total Protein 05/01/2014 6.7  6.0 - 8.3 g/dL Final  . Albumin 05/01/2014 3.6  3.5 - 5.2 g/dL Final  . AST 05/01/2014 12  0 - 37 U/L Final  . ALT 05/01/2014 10  0 - 35 U/L Final  . Alkaline Phosphatase 05/01/2014 67  39 - 117 U/L Final  . Total Bilirubin 05/01/2014 0.4  0.3 - 1.2 mg/dL Final  . GFR calc non Af Amer 05/01/2014 35* >90 mL/min Final  . GFR calc Af Amer 05/01/2014 41* >90 mL/min Final   Comment: (NOTE)                          The eGFR has been calculated using the CKD EPI equation.                          This calculation has not been validated in all clinical situations.                          eGFR's persistently <90 mL/min signify possible Chronic Kidney                          Disease.  . Anion gap 05/01/2014 16* 5 - 15 Final  . CA 27.29 05/01/2014 9  0 - 39 U/mL Final   Performed at [facility]  . CEA 05/01/2014 0.6  0.0 - 5.0 ng/mL Final   Performed at [facility]: Chest wall metastases are HER-2/neu overexpressed. Brain metastases were triple negative. Both tumor sites were estrogen receptor negative.  Urinalysis No results found for this basename: colorurine,  appearanceur,  labspec,  phurine,   glucoseu,  hgbur,  bilirubinur,  ketonesur,  proteinur,  urobilinogen,  nitrite,  leukocytesur    RADIOGRAPHIC STUDIES: Nm Cardiac Muga Rest  04/26/2014   CLINICAL DATA:  Breast cancer, pre cardiac toxic chemotherapy  EXAM: NUCLEAR MEDICINE CARDIAC BLOOD POOL IMAGING (MUGA)  TECHNIQUE: Cardiac multi-gated acquisition was performed at rest following intravenous injection of Tc-2mlabeled red blood cells.  RADIOPHARMACEUTICALS:  27.5 mCi Tc-925mertechnetate in-vitro labeled autologous red blood cells IV.  COMPARISON:  None  FINDINGS: Calculated LEFT ventricular ejection fraction is 81%, slightly elevated.  Wall motion analysis of the LEFT ventricle is normal.  IMPRESSION: Minimally elevated LEFT ventricular ejection fraction of 81% with normal LV wall motion.   Electronically Signed   By: MaLavonia Dana.D.   On: 04/26/2014 15:29    ASSESSMENT:  #1. Stage IV breast cancer with brain and right chest wall metastases, brain lesion is ER/PR negative, HER-2/neu negative; right chest wall lesion is ER weakly positive, PR negative, HER-2/neu overexpressed, relapse chest  wall 4 months at the conclusion of combination pertuzumab/trastuzumab and antibody therapy. 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 For cycle 2 of Kadcyla today..  #2. Right thoracic herpes zoster together with metastatic disease.  #3. Right upper extremity lymphedema, wearing a sleeve.  #4. Peripheral neuropathy, on gabapentin.  PLAN:  #1. Kadcyla cycle #2 today. #2. Fentanyl 50 patch every 3 days with oxycodone for breakthrough pain. #3. Continue gabapentin. #4. Followup in 3 weeks to receive cycle #3 of Kadcyla. CBC and chem profile at that time.   All questions were answered. The patient knows to call the clinic with any problems, questions or concerns. We can certainly see the patient much  sooner if necessary.   I spent 25 minutes counseling the patient face to face. The total time spent in the appointment was 30 minutes.    Doroteo Bradford, MD 05/22/2014 11:33 AM  DISCLAIMER:  This note was dictated with voice recognition software.  Similar sounding words can inadvertently be transcribed inaccurately and may not be corrected upon review.

## 2014-05-22 NOTE — Patient Instructions (Signed)
[facility] Discharge Instructions  RECOMMENDATIONS MADE BY THE CONSULTANT AND ANY TEST RESULTS WILL BE SENT TO YOUR REFERRING PHYSICIAN.  EXAM FINDINGS BY THE PHYSICIAN TODAY AND SIGNS OR SYMPTOMS TO REPORT TO CLINIC OR PRIMARY PHYSICIAN: Exam and findings as discussed by Dr. Barnet Glasgow.  Will start you on something long acting for pain to see if we can get you more comfortable.  Report fevers, chills, uncontrolled nausea, vomiting or other concerns  MEDICATIONS PRESCRIBED:  Fentanyl patches 50 mcg every 72 hours for pain Refill for oxycodone.  INSTRUCTIONS/FOLLOW-UP: Labs and chemo in 3 weeks MD follow-up in 3 weeks.  Thank you for choosing [facility] to provide your oncology and hematology care.  To afford each patient quality time with our providers, please arrive at least 15 minutes before your scheduled appointment time.  With your help, our goal is to use those 15 minutes to complete the necessary work-up to ensure our physicians have the information they need to help with your evaluation and healthcare recommendations.    Effective January 1st, 2014, we ask that you re-schedule your appointment with our physicians should you arrive 10 or more minutes late for your appointment.  We strive to give you quality time with our providers, and arriving late affects you and other patients whose appointments are after yours.    Again, thank you for choosing [facility].  Our hope is that these requests will decrease the amount of time that you wait before being seen by our physicians.       _____________________________________________________________  Should you have questions after your visit to [facility], please contact our office at [phone] between the hours of 8:30 a.m. and 4:30 p.m.  Voicemails left after 4:30 p.m. will not be returned until the following business day.  For prescription refill requests, have your pharmacy  contact our office with your prescription refill request.    _______________________________________________________________  We hope that we have given you very good care.  You may receive a patient satisfaction survey in the mail, please complete it and return it as soon as possible.  We value your feedback!  _______________________________________________________________  Have you asked about our STAR program?  STAR stands for Survivorship Training and Rehabilitation, and this is a nationally recognized cancer care program that focuses on survivorship and rehabilitation.  Cancer and cancer treatments may cause problems, such as, pain, making you feel tired and keeping you from doing the things that you need or want to do. Cancer rehabilitation can help. Our goal is to reduce these troubling effects and help you have the best quality of life possible.  You may receive a survey from a nurse that asks questions about your current state of health.  Based on the survey results, all eligible patients will be referred to the [facility] program for an evaluation so we can better serve you!  A frequently asked questions sheet is available upon request.   fentanyl skin patch What is this medicine? FENTANYL (FEN ta nil) is a pain reliever. It is used to treat persistent, moderate to severe chronic pain. It is used only by people who have been taking an opioid or narcotic pain medicine for more than one week. This medicine may be used for other purposes; ask your health care provider or pharmacist if you have questions. COMMON BRAND NAME(S): Duragesic What should I tell my health care provider before I take this medicine? They need to know  if you have any of these conditions: -brain tumor -Crohn's disease, inflammatory bowel disease, or ulcerative colitis -drug abuse or addiction -head injury -heart disease -if you frequently drink alcohol containing drinks -kidney disease or problems going to the  bathroom -liver disease -lung disease, asthma, or breathing problems -mental problems -skin problems -taken isocarboxazid, phenelzine, tranylcypromine, or selegiline in the past 2 weeks -an allergic or unusual reaction to fentanyl, meperidine, other medicines, foods, dyes, or preservatives -pregnant or trying to get pregnant -breast-feeding How should I use this medicine? Apply the patch to your skin. Do not cut or damage the patch. A cut or damaged patch can be very dangerous because you may get too much medicine. Select a clean, dry area of skin above your waist on your front or back. The upper back is a good spot to put the patch on children or people who are confused because it will be hard for them to remove the patch. Do not apply the patch to oily, broken, burned, cut, or irritated skin. Use only water to clean the area. Do not use soap or alcohol to clean the skin because this can increase the effects of the medicine. If the area is hairy, clip the hair with scissors, but do not shave. Take the patch out of its wrapper, and take off the protective strip over the sticky part. Do not use a patch if the packaging or backing is damaged. Do not touch the sticky part with your fingers. Press the sticky surface to the skin using the palm of your hand. Press the patch to the skin for 30 seconds. Wash your hands at once with soap and water. Keep patches far away from children. Do not let children see you apply the patch and do not apply it where children can see it. Do not call the patch a sticker, tattoo, or bandage, as this could encourage the child to mimic your actions. Used patches still contain medicine. Children or pets can have serious side effects or die from putting used patches in their mouth or on their bodies. Take off the old patch before putting on a new patch. Apply each new patch to a different area of skin. If a patch comes off or causes irritation, remove it and apply a new patch to  different site. If the edges of the patch start to loosen, first apply first aid tape to the edges of the patch. If problems with the patch not sticking continue, cover the patch with a see-through adhesive dressing (like Bioclusive or Tegaderm). Never cover the patch with any other bandage or tape. To get rid of used patches, fold the patch in half with the sticky sides together. Then, flush it down the toilet. Do not discard the patch in the garbage. Pets and children can be harmed if they find used or lost patches. Replace the patch every 3 days or as directed by your doctor or health care professional. Follow the directions on the prescription label. Do not take more medicine than you are told to take. A special MedGuide will be given to you by the pharmacist with each prescription and refill. Be sure to read this information carefully each time. Talk to your pediatrician regarding the use of this medicine in children. While this drug may be prescribed for children as young as 31 years old for selected conditions, precautions do apply. If someone accidentally uses a fentanyl patch and is not awake and alert, immediately call 911 for help. If  the person is awake and alert, call a doctor, health care professional, or the [employer]. Overdosage: If you think you have taken too much of this medicine contact a poison control center or emergency room at once. NOTE: This medicine is only for you. Do not share this medicine with others. What if I miss a dose? If you forget to replace your patch, take off the old patch and put on a new patch as soon as you can. Do not apply an extra patch to your skin. Do not wear more than one patch at the same time unless told to do so by your doctor or health care professional. What may interact with this medicine? -alcohol -antihistamines -clarithromycin -diltiazem -erythromycin -grapefruit juice -herbal products that contain [place]  wort -itraconazole -ketoconazole -medicines for depression, anxiety, or psychotic disturbances -medicines for sleep -muscle relaxants -naltrexone -narcotic medicines (opiates) for pain -nelfinavir -nicardipine -phenobarbital, phenytoin, or fosphenytoin -rifampin -ritonavir -troleandomycin -verapamil This list may not describe all possible interactions. Give your health care provider a list of all the medicines, herbs, non-prescription drugs, or dietary supplements you use. Also tell them if you smoke, drink alcohol, or use illegal drugs. Some items may interact with your medicine. What should I watch for while using this medicine? Other pain medicine may be needed the first day you use the patch because the patch can take some time to start working. Tell your doctor or health care professional if your pain does not go away, if it gets worse, or if you have new or a different type of pain. You may develop tolerance to the medicine. Tolerance means that you will need a higher dose of the medicine for pain relief. Tolerance is normal and is expected if you take the medicine for a long time. Have a family member watch you for side effects during the first day you wear a patch or if your dose changes. Do not suddenly stop taking your medicine because you may develop a severe reaction. Your body becomes used to the medicine. This does NOT mean you are addicted. Addiction is a behavior related to getting and using a drug for a non-medical reason. If you have pain, you have a medical reason to take pain medicine. Your doctor will tell you how much medicine to take. If your doctor wants you to stop the medicine, the dose will be slowly lowered over time to avoid any side effects. You may get drowsy or dizzy when you first start taking the medicine or change doses. Do not drive, use machinery, or do anything that may be dangerous until you know how the medicine affects you. Stand or sit up slowly. There  are different types of narcotic medicines (opiates) for pain. If you take more than one type at the same time, you may have more side effects. Give your health care provider a list of all medicines you use. Your doctor will tell you how much medicine to take. Do not take more medicine than directed. Call emergency for help if you have problems breathing. The medicine will cause constipation. Try to have a bowel movement at least every 2 to 3 days. If you do not have a bowel movement for 3 days, call your doctor or health care professional. Your mouth may get dry. Drinking water, chewing sugarless gum, or sucking on hard candy may help. See your dentist every 6 months. Heat can increase the amount of medicine released from the patch. Do not get the  patch hot by using heating pads, heated water beds, electric blankets, and heat lamps. You can bathe or swim while using the patch. But, do not use a sauna or hot tub. Tell you doctor or health care professional if you get a fever. If gel leaks from the patch, wash your skin well with water only. Do not use soap or cleansers containing alcohol. Remove the patch from your skin and discard as instructed above. Medicine from a partly attached or leaking patch can transfer to a child or pet when they are being held. Exposure of children or pets to the patch can cause serious side effects and even death. If you are going to have a magnetic resonance imaging (MRI) procedure, tell your MRI technician if you have this patch on your body. It must be removed before a MRI. What side effects may I notice from receiving this medicine? Side effects that you should report to your doctor or health care professional as soon as possible: -allergic reactions like skin rash, itching or hives, swelling of the face, lips, or tongue -breathing problems -changes in vision -confusion -feeling faint, lightheaded -fever, flu-like symptoms -hallucination -high or low blood  pressure -irregular heartbeat -problems with balance, talking, walking -trouble passing urine or change in the amount of urine -unusual bleeding or bruising -unusually weak or tired Side effects that usually do not require medical attention (report to your doctor or health care professional if they continue or are bothersome): -constipation -dry mouth -itching where the patch was applied -loss of appetite -nausea, vomiting -sweating This list may not describe all possible side effects. Call your doctor for medical advice about side effects. You may report side effects to FDA at 1-800-FDA-1088. Where should I keep my medicine? Keep out of the reach of children. This medicine can be abused. Keep your medicine in a safe place to protect it from theft. Do not share this medicine with anyone. Selling or giving away this medicine is dangerous and against the law. Store below 77 degrees F (25 degrees C). Do not store the patches out of their wrappers. This medicine may cause accidental overdose and death if it is taken by other adults, children, or pets. Flush any unused medicine down the toilet as instructed above to reduce the chance of harm. Do not use the medicine after the expiration date. NOTE: This sheet is a summary. It may not cover all possible information. If you have questions about this medicine, talk to your doctor, pharmacist, or health care provider.  2015, [employer]vier/Gold Standard. (2013-03-28 17:25:22)

## 2014-05-31 ENCOUNTER — Other Ambulatory Visit: Payer: Self-pay | Admitting: Radiation Therapy

## 2014-05-31 DIAGNOSIS — C7931 Secondary malignant neoplasm of brain: Secondary | ICD-10-CM

## 2014-05-31 DIAGNOSIS — C7949 Secondary malignant neoplasm of other parts of nervous system: Principal | ICD-10-CM

## 2014-06-09 NOTE — Progress Notes (Signed)
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 Anastrozole was stopped on 04/24/2014. Brain recurrence was triple negative and chest wall recurrence was HER-2/neu positive, ER/PR negative. Brain recurrence occurred 4 months after conclusion of pertuzumab/trastuzumab given out to one year.  INTERVAL HISTORY: Erika Nunez 63 y.o. female returns for  regular  visit for followup of metastatic breast cancer, HER-2/neu overexpressed involving the right chest wall, status post brain metastases, status post cerebellar resection followed by [facility] radiotherapy to the brain lesion area on 03/29/2014 receiving 15 Gy.     Breast cancer   10/02/2012 Initial Diagnosis Breast cancer, Her2+, ER-, PR-   10/23/2012 - 02/12/2013 Chemotherapy TCH x 6 cycles   03/05/2013 - 11/05/2013 Chemotherapy Herceptin/Perjeta   06/04/2013 - 04/24/2014 Chemotherapy Arimidex daily   02/20/2014 Relapse/Recurrence CT head- New mass lesion involving the right cerebellar hemisphere with associated vasogenic edema   02/25/2014 Definitive Surgery 1. Suboccipital craniectomy 2. Resection of tumor   3. Use of intraoperative microscope for microdissection    02/25/2014 Pathology Results 1. Brain, for tumor resection, Right cerebellar- metastatic carcinoma 2. Brain, for tumor resection, Right  cerebellar- metastatic carcinoma. 3. Brain, for tumor resection, Right cerebellar- metastatic carcinoma.  HER2-. ER/PR 0%, Ki-67 92%.   04/01/2014 Relapse/Recurrence PET- Marked hypermetabolism associated with the abnormal and irregular thickening of the skin over the mid and lower right lateral and post for lateral chest wall.   04/15/2014 Procedure Soft tissue mass, biopsy, right chest wall by Dr. Marlou Starks showing invasive carcinoma with LVI.  HER2+, ER 2%, PR 0%, Ki-67 87%.   05/01/2014 -  Chemotherapy Kadcyla 3.6 mg/kg every 21 days    I personally reviewed and went over laboratory results with the patient.  The results are noted within this dictation.  She is tolerating therapy well.  She denies any complaints associated with treatment.  She notes that she feels better today than 2 months ago.  She reports that her current pain regimen control her discomfort very well.  She notes her right thoracic subcutaneous involvement is better compared to the start of treatment.  Her pain is better (treatment effect versus pain control with medication).  Her erythema is decreased compared to time of diagnosis.  Her lesions protrude from the skin more that I remember at the time of diagnosis.  She reports that drainage is nearly resolved.  Oncologically, she denies any complaints and ROS questioning is negative.    Past Medical History  Diagnosis Date  . Hypertension   . High cholesterol   . Cancer   . Breast cancer   . Allergy     seasonal allergies  . Arthritis   . Anxiety   . Breast mass, left 2014  . Breast mass, right 2014  . 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 Depression   . 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     is allergic to codeine and famvir.  Erika Nunez had no medications administered during this visit.  Past Surgical History  Procedure Laterality Date  . Cholecystectomy    . Tubal ligation    . Tonsillectomy    . Breast biopsy  10/02/2012    Procedure: BREAST BIOPSY;  Surgeon: Donato Heinz, MD;  Location: [facility];  Service: General;  Laterality: Right;  . Portacath placement Left 10/20/2012  . Breast biopsy Left 10/20/2012    Procedure: BREAST BIOPSY;  Surgeon: Donato Heinz, MD;  Location: [facility];  Service: General;  Laterality: Left;  Procedure end 1142  . Portacath placement Left 10/20/2012    Procedure: INSERTION PORT-A-CATH;  Surgeon: Donato Heinz, MD;  Location: [facility];  Service: General;  Laterality: Left;  Procedure began 7062; left subclavian  . Simple mastectomy with axillary sentinel node biopsy Bilateral 04/25/2013    Procedure: SIMPLE MASTECTOMY;  Surgeon: Donato Heinz, MD;  Location: [facility];  Service: General;  Laterality: Bilateral;  . Craniotomy Right 02/26/2014    Procedure: suboccipital craniotomy for tumor resection;  Surgeon: Consuella Lose, MD;  Location: [facility];  Service: Neurosurgery;  Laterality: Right;  suboccipital craniotomy for tumor resection  . Skin biopsy Right 04/15/2014    right lateral chest    Denies any headaches, dizziness, double vision, fevers, chills, night sweats, nausea, vomiting, diarrhea, constipation, chest pain, heart palpitations, shortness of breath, blood in stool, black tarry stool, urinary pain, urinary burning, urinary frequency, hematuria.   PHYSICAL EXAMINATION  ECOG PERFORMANCE STATUS: 1 - Symptomatic but completely ambulatory  There were no vitals filed for this visit.  GENERAL:alert, well nourished, well developed, comfortable, cooperative, obese and smiling, in chemo-recliner SKIN: skin color, texture,  turgor are normal, no rashes or significant lesions HEAD: Normocephalic, No masses, lesions, tenderness or abnormalities EYES: normal, PERRLA, EOMI, Conjunctiva are pink and non-injected EARS: External ears normal OROPHARYNX:mucous membranes are moist  NECK: supple, no stridor, non-tender, trachea midline LYMPH:  not examined BREAST:not examined LUNGS: clear to auscultation  HEART: regular rate & rhythm, no murmurs and no gallops ABDOMEN:abdomen soft, obese and normal bowel sounds THORAX: Right sided subcutaneous band of lesions is erythematous (better than the start of therapy) with nodules clearly raised from the level of the skin.  Some are crusted over.  No active drainage.  No indication of active infection.  Less erythematous than at the start of therapy. BACK: Back symmetric, no curvature. EXTREMITIES:less then 2 second capillary refill, no joint deformities, effusion, or inflammation, no skin discoloration, no clubbing, no cyanosis  NEURO: alert & oriented x 3 with fluent speech, no focal motor/sensory deficits    LABORATORY DATA: CBC    Component Value Date/Time   WBC 9.7 06/12/2014 1120   RBC 3.51* 06/12/2014 1120   RBC 3.53* 10/03/2012 0832   HGB 11.1* 06/12/2014 1120   HCT 33.0* 06/12/2014 1120   PLT 284 06/12/2014 1120   MCV 94.0 06/12/2014 1120   MCH 31.6 06/12/2014 1120   MCHC 33.6 06/12/2014 1120   RDW 13.6 06/12/2014 1120   LYMPHSABS 1.1 06/12/2014 1120   MONOABS 0.9 06/12/2014 1120   EOSABS 0.1 06/12/2014 1120   BASOSABS  0.0 06/12/2014 1120      Chemistry      Component Value Date/Time   NA 138 05/22/2014 1040   K 3.9 05/22/2014 1040   CL 98 05/22/2014 1040   CO2 25 05/22/2014 1040   BUN 19 05/22/2014 1040   CREATININE 1.23* 05/22/2014 1040      Component Value Date/Time   CALCIUM 10.8* 05/22/2014 1040   ALKPHOS 77 05/22/2014 1040   AST 22 05/22/2014 1040   ALT 18 05/22/2014 1040   BILITOT 0.3 05/22/2014 1040     Lab Results  Component Value Date   LABCA2 9 05/01/2014       ASSESSMENT:  1. Stage IV breast cancer with brain and right chest wall metastases, brain lesion is ER/PR negative, HER-2/neu negative; right chest wall lesion is ER weakly positive, PR negative, HER-2/neu overexpressed, relapse chest wall 4 months at the conclusion of combination pertuzumab/trastuzumab and antibody therapy. 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  On TDM1 treatment (Kadcyla). 2. 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  3. Right upper extremity lymphedema, wearing a sleeve.  4. Peripheral neuropathy, on gabapentin.  Patient Active Problem List   Diagnosis Date Noted  . 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 Head ache 02/26/2014  . Metastatic cancer to brain 02/26/2014  . Cerebellar mass 02/21/2014  . Obstructive hydrocephalus 02/20/2014  . Vasogenic brain edema 02/20/2014  . Lymphedema of upper extremity following lymphadenectomy 06/05/2013  . Delayed postoperative wound closure 06/05/2013  . Thrombocytopenia, unspecified 01/04/2013  . 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 Hypertension 12/31/2012  . Breast cancer 10/12/2012     PLAN:  1. I personally reviewed and went over laboratory results with the patient.  The results are noted within this dictation. 2. Pre-chemo labs as planned 3. Chemotherapy, Kadcyla, as planned today 4. Rx for Fentanyl 50 mcg patch #10 5. Refill on Gabapentin, escribed 6. Return in 3 weeks for next cycle and follow-up appointment.   THERAPY PLAN:  We will continue Kadcyla as planned and manage toxicities if/when they arise.  All questions were answered. The patient knows to call the clinic with any problems, questions or concerns. We can certainly see the patient much sooner if necessary.  Patient  and plan discussed with Dr. Farrel Gobble and he is in agreement with the aforementioned.   Erika Nunez 06/12/2014

## 2014-06-11 ENCOUNTER — Encounter: Payer: Self-pay | Admitting: Radiation Oncology

## 2014-06-11 NOTE — Progress Notes (Signed)
  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

## 2014-06-12 ENCOUNTER — Encounter (HOSPITAL_BASED_OUTPATIENT_CLINIC_OR_DEPARTMENT_OTHER): Payer: Medicaid Other

## 2014-06-12 ENCOUNTER — Encounter: Payer: Self-pay | Admitting: *Deleted

## 2014-06-12 ENCOUNTER — Encounter (HOSPITAL_COMMUNITY): Payer: Self-pay | Admitting: Oncology

## 2014-06-12 ENCOUNTER — Telehealth: Payer: Self-pay | Admitting: Radiation Oncology

## 2014-06-12 ENCOUNTER — Ambulatory Visit
Admission: RE | Admit: 2014-06-12 | Discharge: 2014-06-12 | Disposition: A | Discharge status: Home / Self Care | Payer: Medicaid Other | Source: Ambulatory Visit | Attending: Radiation Oncology | Admitting: Radiation Oncology

## 2014-06-12 ENCOUNTER — Encounter (HOSPITAL_BASED_OUTPATIENT_CLINIC_OR_DEPARTMENT_OTHER): Payer: Medicaid Other | Admitting: Oncology

## 2014-06-12 VITALS — BP 100/52 | HR 83 | Temp 98.0°F | Resp 18 | Wt 193.4 lb

## 2014-06-12 DIAGNOSIS — C7931 Secondary malignant neoplasm of brain: Secondary | ICD-10-CM

## 2014-06-12 DIAGNOSIS — C7949 Secondary malignant neoplasm of other parts of nervous system: Secondary | ICD-10-CM

## 2014-06-12 DIAGNOSIS — C50419 Malignant neoplasm of upper-outer quadrant of unspecified female breast: Secondary | ICD-10-CM

## 2014-06-12 DIAGNOSIS — C779 Secondary and unspecified malignant neoplasm of lymph node, unspecified: Secondary | ICD-10-CM

## 2014-06-12 DIAGNOSIS — C799 Secondary malignant neoplasm of unspecified site: Principal | ICD-10-CM

## 2014-06-12 DIAGNOSIS — C50911 Malignant neoplasm of unspecified site of right female breast: Secondary | ICD-10-CM

## 2014-06-12 DIAGNOSIS — G629 Polyneuropathy, unspecified: Secondary | ICD-10-CM

## 2014-06-12 DIAGNOSIS — C50919 Malignant neoplasm of unspecified site of unspecified female breast: Secondary | ICD-10-CM

## 2014-06-12 DIAGNOSIS — Z5112 Encounter for antineoplastic immunotherapy: Secondary | ICD-10-CM

## 2014-06-12 DIAGNOSIS — I890 Lymphedema, not elsewhere classified: Secondary | ICD-10-CM

## 2014-06-12 DIAGNOSIS — IMO0001 Reserved for inherently not codable concepts without codable children: Secondary | ICD-10-CM

## 2014-06-12 DIAGNOSIS — G609 Hereditary and idiopathic neuropathy, unspecified: Secondary | ICD-10-CM

## 2014-06-12 LAB — CBC WITH DIFFERENTIAL/PLATELET
Basophils Absolute: 0 10*3/uL (ref 0.0–0.1)
Basophils Relative: 0 % (ref 0–1)
EOS ABS: 0.1 10*3/uL (ref 0.0–0.7)
EOS PCT: 1 % (ref 0–5)
HCT: 33 % — ABNORMAL LOW (ref 36.0–46.0)
Hemoglobin: 11.1 g/dL — ABNORMAL LOW (ref 12.0–15.0)
Lymphocytes Relative: 12 % (ref 12–46)
Lymphs Abs: 1.1 10*3/uL (ref 0.7–4.0)
MCH: 31.6 pg (ref 26.0–34.0)
MCHC: 33.6 g/dL (ref 30.0–36.0)
MCV: 94 fL (ref 78.0–100.0)
Monocytes Absolute: 0.9 10*3/uL (ref 0.1–1.0)
Monocytes Relative: 10 % (ref 3–12)
NEUTROS PCT: 77 % (ref 43–77)
Neutro Abs: 7.5 10*3/uL (ref 1.7–7.7)
PLATELETS: 284 10*3/uL (ref 150–400)
RBC: 3.51 MIL/uL — ABNORMAL LOW (ref 3.87–5.11)
RDW: 13.6 % (ref 11.5–15.5)
WBC: 9.7 10*3/uL (ref 4.0–10.5)

## 2014-06-12 LAB — COMPREHENSIVE METABOLIC PANEL
ALT: 27 U/L (ref 0–35)
AST: 28 U/L (ref 0–37)
Albumin: 3.4 g/dL — ABNORMAL LOW (ref 3.5–5.2)
Alkaline Phosphatase: 84 U/L (ref 39–117)
Anion gap: 11 (ref 5–15)
BUN: 20 mg/dL (ref 6–23)
CALCIUM: 10.7 mg/dL — AB (ref 8.4–10.5)
CO2: 27 mEq/L (ref 19–32)
Chloride: 100 mEq/L (ref 96–112)
Creatinine, Ser: 1.36 mg/dL — ABNORMAL HIGH (ref 0.50–1.10)
GFR calc non Af Amer: 40 mL/min — ABNORMAL LOW (ref >90)
GFR, EST AFRICAN AMERICAN: 47 mL/min — AB (ref >90)
Glucose, Bld: 99 mg/dL (ref 70–99)
Potassium: 4.7 mEq/L (ref 3.7–5.3)
SODIUM: 138 meq/L (ref 137–147)
TOTAL PROTEIN: 7 g/dL (ref 6.0–8.3)
Total Bilirubin: 0.2 mg/dL — ABNORMAL LOW (ref 0.3–1.2)

## 2014-06-12 MED ORDER — SODIUM CHLORIDE 0.9 % IV SOLN
Freq: Once | INTRAVENOUS | Status: AC
  Administered 2014-06-12: 10:00:00 via INTRAVENOUS

## 2014-06-12 MED ORDER — HEPARIN SOD (PORK) LOCK FLUSH 100 UNIT/ML IV SOLN
500.0000 [IU] | Freq: Once | INTRAVENOUS | Status: AC | PRN
  Administered 2014-06-12: 500 [IU]

## 2014-06-12 MED ORDER — FENTANYL 50 MCG/HR TD PT72: 50.0000 ug | MEDICATED_PATCH | TRANSDERMAL | Status: DC

## 2014-06-12 MED ORDER — GABAPENTIN 300 MG PO CAPS: 300.0000 mg | ORAL_CAPSULE | Freq: Two times a day (BID) | ORAL | Status: AC

## 2014-06-12 MED ORDER — HEPARIN SOD (PORK) LOCK FLUSH 100 UNIT/ML IV SOLN
INTRAVENOUS | Status: AC
  Filled 2014-06-12: qty 5

## 2014-06-12 MED ORDER — SODIUM CHLORIDE 0.9 % IJ SOLN: 10.0000 mL | INTRAMUSCULAR | Status: DC | PRN

## 2014-06-12 MED ORDER — SODIUM CHLORIDE 0.9 % IV SOLN
3.6000 mg/kg | Freq: Once | INTRAVENOUS | Status: AC
  Administered 2014-06-12: 320 mg via INTRAVENOUS
  Filled 2014-06-12: qty 16

## 2014-06-12 NOTE — Progress Notes (Signed)
Erika Nunez Tolerated chemotherapy well today. Discharged ambulatory

## 2014-06-12 NOTE — Progress Notes (Signed)
[facility] Clinical Social Work  Clinical Social Work was referred by patient for assessment of psychosocial needs.  Clinical Social Worker met with patient at [facility] to offer support and assess for needs.  Pt reports to be doing pretty well today. CSW reintroduced self and explained role. CSW also mentioned Brain Tumor Support group as an additional resource to pt.  Pt agrees to reach out to CSW as needed.     Clinical Social Work interventions: Emotional support Resource education  Loren Racer, [place] Tuesdays 8:30-1pm Wednesdays 8:30-12pm  [phone]

## 2014-06-12 NOTE — Patient Instructions (Signed)
[facility] Discharge Instructions  RECOMMENDATIONS MADE BY THE CONSULTANT AND ANY TEST RESULTS WILL BE SENT TO YOUR REFERRING PHYSICIAN.  EXAM FINDINGS BY THE PHYSICIAN TODAY AND SIGNS OR SYMPTOMS TO REPORT TO CLINIC OR PRIMARY PHYSICIAN: Exam and findings as discussed by Robynn Pane, PA-C.  Thinks that your rash is better. Report fevers, chills, other other concerns.  MEDICATIONS PRESCRIBED:  Fentanyl patches - use as prescribed Gabapentin - use as prescribed  INSTRUCTIONS/FOLLOW-UP: 3 weeks with chemo and office visit.  Thank you for choosing [facility] to provide your oncology and hematology care.  To afford each patient quality time with our providers, please arrive at least 15 minutes before your scheduled appointment time.  With your help, our goal is to use those 15 minutes to complete the necessary work-up to ensure our physicians have the information they need to help with your evaluation and healthcare recommendations.    Effective January 1st, 2014, we ask that you re-schedule your appointment with our physicians should you arrive 10 or more minutes late for your appointment.  We strive to give you quality time with our providers, and arriving late affects you and other patients whose appointments are after yours.    Again, thank you for choosing [facility].  Our hope is that these requests will decrease the amount of time that you wait before being seen by our physicians.       _____________________________________________________________  Should you have questions after your visit to [facility], please contact our office at [phone] between the hours of 8:30 a.m. and 4:30 p.m.  Voicemails left after 4:30 p.m. will not be returned until the following business day.  For prescription refill requests, have your pharmacy contact our office with your prescription refill request.     _______________________________________________________________  We hope that we have given you very good care.  You may receive a patient satisfaction survey in the mail, please complete it and return it as soon as possible.  We value your feedback!  _______________________________________________________________  Have you asked about our STAR program?  STAR stands for Survivorship Training and Rehabilitation, and this is a nationally recognized cancer care program that focuses on survivorship and rehabilitation.  Cancer and cancer treatments may cause problems, such as, pain, making you feel tired and keeping you from doing the things that you need or want to do. Cancer rehabilitation can help. Our goal is to reduce these troubling effects and help you have the best quality of life possible.  You may receive a survey from a nurse that asks questions about your current state of health.  Based on the survey results, all eligible patients will be referred to the [facility] program for an evaluation so we can better serve you!  A frequently asked questions sheet is available upon request.

## 2014-06-12 NOTE — Progress Notes (Signed)
Labs for cbcd,cmp 

## 2014-06-12 NOTE — Telephone Encounter (Signed)
Patient has not shown for 0930 follow up with Dr. Tammi Klippel. Called patient's cell. No answer. Left message requesting return call. Also, reminded patient of MRI scheduled for October.

## 2014-07-01 NOTE — Progress Notes (Signed)
Erika Sax, MD [name] [place] [address]  Breast cancer, right - Plan: Oxycodone HCl 10 MG TABS, fentaNYL (DURAGESIC - DOSED MCG/HR) 75 MCG/HR  Metastatic cancer to brain - Plan: Oxycodone HCl 10 MG TABS, fentaNYL (DURAGESIC - DOSED MCG/HR) 75 MCG/HR  Metastasis from breast cancer - Plan: Oxycodone HCl 10 MG TABS, fentaNYL (DURAGESIC - DOSED MCG/HR) 75 MCG/HR  CURRENT THERAPY: Kadcyla started on 05/01/2014. 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 Anastrozole was stopped on 04/24/2014. Brain recurrence was triple negative and chest wall recurrence was HER-2/neu positive, ER/PR negative. Brain recurrence occurred 4 months after conclusion of pertuzumab/trastuzumab given out to one year.   INTERVAL HISTORY: Erika Nunez 63 y.o. female returns for  regular  visit for followup of metastatic breast cancer, HER-2/neu overexpressed involving the right chest wall, status post brain metastases, status post cerebellar resection followed by [facility] radiotherapy to the brain lesion area on 03/29/2014 receiving 15 Gy.     Breast cancer   10/02/2012 Initial Diagnosis Breast cancer, Her2+, ER-, PR-   10/23/2012 - 02/12/2013 Chemotherapy TCH x 6 cycles   03/05/2013 - 11/05/2013 Chemotherapy Herceptin/Perjeta   06/04/2013 - 04/24/2014 Chemotherapy Arimidex daily   02/20/2014 Relapse/Recurrence CT head- New mass lesion involving the right cerebellar hemisphere with associated vasogenic edema   02/25/2014 Definitive Surgery 1. Suboccipital craniectomy 2. Resection of tumor   3. Use of intraoperative microscope for microdissection    02/25/2014 Pathology Results 1. Brain, for tumor resection, Right cerebellar- metastatic carcinoma 2. Brain, for tumor resection, Right  cerebellar- metastatic carcinoma. 3. Brain, for tumor resection, Right cerebellar- metastatic carcinoma.  HER2-. ER/PR 0%, Ki-67 92%.   04/01/2014 Relapse/Recurrence PET- Marked hypermetabolism associated with the abnormal and irregular thickening of the skin over the mid and lower right lateral and post for lateral chest wall.   04/15/2014 Procedure Soft tissue mass, biopsy, right chest wall by Dr. Marlou Starks showing invasive carcinoma with LVI.  HER2+, ER 2%, PR 0%, Ki-67 87%.   05/01/2014 -  Chemotherapy Kadcyla 3.6 mg/kg every 21 days   I personally reviewed and went over laboratory results with the patient.  The results are noted within this dictation.  She reports that she is having some increased pain associated with her chest wall involvement.  She reports that she get new lesions that arise and burst.  "Pus" comes out of the lesion.  It then heals up and resolves, but during this process she experiences pain.  She notes that she is not having any new lesions arise outside of the affected area and the the area affecting is coalescing.    She notes that she has not slept well in 4 days due to her pain.  She is using more of her Oxycodone in the past few days as a result.  When asked if her pain is well controlled or could it be better, her response was, "it could be better."  As a result, I will increase her Fentanyl to 75 mcg/hr and refill her Oxycodone.  She is educated that she has a need for pain medications and she should take her breakthrough pain medication when needed. With increased pain medication we discussed continuation of her bowel regimen to prevent constipation.  Opoid-induced constipation  is reviewed.   Oncologically, she denies any other complaints and ROS questioning is negative.   Past Medical History  Diagnosis Date  . Hypertension   . High cholesterol   . Cancer   . Breast cancer   . Allergy     seasonal allergies  . Arthritis   . Anxiety   . Breast mass, left 2014  .  Breast mass, right 2014  . 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 Depression   . Lymphedema of arm     right arm    has Breast cancer; Hypertension; Thrombocytopenia, unspecified; Antineoplastic chemotherapy induced pancytopenia; Lymphedema of upper extremity following lymphadenectomy; Delayed postoperative wound closure; Obstructive hydrocephalus; Vasogenic brain edema; Cerebellar mass; Metastatic cancer to brain; and Metastasis from breast cancer on her problem list.     is allergic to codeine and famvir.  Erika Nunez had no medications administered during this visit.  Past Surgical History  Procedure Laterality Date  . Cholecystectomy    . Tubal ligation    . Tonsillectomy    . Breast biopsy  10/02/2012    Procedure: BREAST BIOPSY;  Surgeon: Donato Heinz, MD;  Location: [facility];  Service: General;  Laterality: Right;  . Portacath placement Left 10/20/2012  . Breast biopsy Left 10/20/2012    Procedure: BREAST BIOPSY;  Surgeon: Donato Heinz, MD;  Location: [facility];  Service: General;  Laterality: Left;  Procedure end 1142  . Portacath placement Left 10/20/2012    Procedure: INSERTION PORT-A-CATH;  Surgeon: Donato Heinz, MD;  Location: [facility];  Service: General;  Laterality: Left;  Procedure began 2376; left subclavian  . Simple mastectomy with axillary sentinel node biopsy Bilateral 04/25/2013    Procedure: SIMPLE MASTECTOMY;  Surgeon: Donato Heinz, MD;  Location: [facility];  Service: General;  Laterality: Bilateral;  . Craniotomy Right 02/26/2014    Procedure: suboccipital craniotomy for tumor resection;  Surgeon: Consuella Lose, MD;  Location: [facility];  Service: Neurosurgery;  Laterality: Right;  suboccipital craniotomy for tumor resection  . Skin biopsy Right 04/15/2014    right lateral chest    Denies any headaches, dizziness, double vision, fevers, chills, night sweats, nausea, vomiting, diarrhea, constipation, chest pain, heart palpitations,  shortness of breath, blood in stool, black tarry stool, urinary pain, urinary burning, urinary frequency, hematuria.   PHYSICAL EXAMINATION  ECOG PERFORMANCE STATUS: 1 - Symptomatic but completely ambulatory  Filed Vitals:   07/03/14 1006  BP: 108/62  Pulse: 117  Temp: 98 F (36.7 C)  Resp: 18    GENERAL:alert, well nourished, well developed, comfortable, cooperative, mild distress, obese and smiling SKIN: skin color, texture, turgor are normal, no rashes or significant lesions HEAD: Normocephalic, No masses, lesions, tenderness or abnormalities EYES: normal, PERRLA, EOMI, Conjunctiva are pink and non-injected EARS: External ears normal OROPHARYNX:lips, buccal mucosa, and tongue normal and mucous membranes are moist  NECK: supple, thyroid normal size, non-tender, without nodularity, no stridor, non-tender, trachea midline LYMPH:  not examined BREAST:not examined LUNGS: clear to auscultation  HEART: regular rate & rhythm ABDOMEN:abdomen soft and obese BACK: Back symmetric, no curvature., Right sided chest wall involvement that is erythematous with single nodules noted within the effected area. EXTREMITIES:less then 2 second capillary refill, no joint deformities, effusion, or inflammation, no edema, no skin discoloration, no clubbing, no cyanosis  NEURO: alert & oriented x 3 with fluent speech, no focal motor/sensory deficits, gait normal   LABORATORY DATA: CBC    Component Value Date/Time   WBC  9.7 06/12/2014 1120   RBC 3.51* 06/12/2014 1120   RBC 3.53* 10/03/2012 0832   HGB 11.1* 06/12/2014 1120   HCT 33.0* 06/12/2014 1120   PLT 284 06/12/2014 1120   MCV 94.0 06/12/2014 1120   MCH 31.6 06/12/2014 1120   MCHC 33.6 06/12/2014 1120   RDW 13.6 06/12/2014 1120   LYMPHSABS 1.1 06/12/2014 1120   MONOABS 0.9 06/12/2014 1120   EOSABS 0.1 06/12/2014 1120   BASOSABS 0.0 06/12/2014 1120      Chemistry      Component Value Date/Time   NA 138 06/12/2014 1120   K 4.7 06/12/2014 1120   CL  100 06/12/2014 1120   CO2 27 06/12/2014 1120   BUN 20 06/12/2014 1120   CREATININE 1.36* 06/12/2014 1120      Component Value Date/Time   CALCIUM 10.7* 06/12/2014 1120   ALKPHOS 84 06/12/2014 1120   AST 28 06/12/2014 1120   ALT 27 06/12/2014 1120   BILITOT 0.2* 06/12/2014 1120     Lab Results  Component Value Date   LABCA2 9 05/01/2014      ASSESSMENT:  1. Stage IV breast cancer with brain and right chest wall metastases, brain lesion is ER/PR negative, HER-2/neu negative; right chest wall lesion is ER weakly positive, PR negative, HER-2/neu overexpressed, relapse chest wall 4 months at the conclusion of combination pertuzumab/trastuzumab and antibody therapy. 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 On TDM1 treatment (Kadcyla).  2. Right thoracic metastatic disease 3. Right upper extremity lymphedema, wearing a sleeve.  4. Peripheral neuropathy, on gabapentin.  Patient Active Problem List   Diagnosis Date Noted  . 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 Metastatic cancer to brain 02/26/2014  . Cerebellar mass 02/21/2014  . Obstructive hydrocephalus 02/20/2014  . Vasogenic brain edema 02/20/2014  . Lymphedema of upper extremity following lymphadenectomy 06/05/2013  . Delayed postoperative wound closure 06/05/2013  . Thrombocytopenia, unspecified 01/04/2013  . 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 Hypertension 12/31/2012  . Breast cancer 10/12/2012     PLAN:  1. I personally reviewed and went over laboratory results with the patient.  The results are noted within this dictation. 2. Pre-chemo labs as planned  3. Chemotherapy, Kadcyla, as planned today 4. MRI of brain as planned later this month 5. Follow-up with Dr. Tammi Klippel as scheduled 6. Increase Fentanyl to 75 mcg/hr 7. Refill on Oxycodone 10 mg #120 8.  Patient education provided regarding malignancy-induced pain 9. Bowel regimen reviewed 10. Return in 3 weeks for follow-up   THERAPY PLAN:  We will continue Kadcyla as planned and manage toxicities if/when they arise.   All questions were answered. The patient knows to call the clinic with any problems, questions or concerns. We can certainly see the patient much sooner if necessary.  Patient and plan discussed with Dr. Farrel Gobble and he is in agreement with the aforementioned.  Patient seen and examined by Dr. Barnet Glasgow as well.  KEFALAS,THOMAS 07/03/2014

## 2014-07-03 ENCOUNTER — Encounter (HOSPITAL_BASED_OUTPATIENT_CLINIC_OR_DEPARTMENT_OTHER): Payer: Medicaid Other

## 2014-07-03 ENCOUNTER — Encounter (HOSPITAL_COMMUNITY): Payer: Medicaid Other | Attending: Hematology and Oncology

## 2014-07-03 ENCOUNTER — Encounter (HOSPITAL_BASED_OUTPATIENT_CLINIC_OR_DEPARTMENT_OTHER): Payer: Medicaid Other | Admitting: Oncology

## 2014-07-03 VITALS — BP 104/54 | HR 92 | Resp 18

## 2014-07-03 VITALS — BP 108/62 | HR 117 | Temp 98.0°F | Resp 18 | Wt 192.3 lb

## 2014-07-03 DIAGNOSIS — C50911 Malignant neoplasm of unspecified site of right female breast: Secondary | ICD-10-CM

## 2014-07-03 DIAGNOSIS — C7931 Secondary malignant neoplasm of brain: Secondary | ICD-10-CM

## 2014-07-03 DIAGNOSIS — C799 Secondary malignant neoplasm of unspecified site: Secondary | ICD-10-CM

## 2014-07-03 DIAGNOSIS — G609 Hereditary and idiopathic neuropathy, unspecified: Secondary | ICD-10-CM

## 2014-07-03 DIAGNOSIS — C50919 Malignant neoplasm of unspecified site of unspecified female breast: Secondary | ICD-10-CM

## 2014-07-03 DIAGNOSIS — IMO0001 Reserved for inherently not codable concepts without codable children: Secondary | ICD-10-CM

## 2014-07-03 DIAGNOSIS — Z5112 Encounter for antineoplastic immunotherapy: Secondary | ICD-10-CM

## 2014-07-03 DIAGNOSIS — I890 Lymphedema, not elsewhere classified: Secondary | ICD-10-CM

## 2014-07-03 DIAGNOSIS — Z171 Estrogen receptor negative status [ER-]: Secondary | ICD-10-CM

## 2014-07-03 DIAGNOSIS — C792 Secondary malignant neoplasm of skin: Secondary | ICD-10-CM | POA: Insufficient documentation

## 2014-07-03 LAB — CBC WITH DIFFERENTIAL/PLATELET
BASOS ABS: 0 10*3/uL (ref 0.0–0.1)
BASOS PCT: 0 % (ref 0–1)
EOS ABS: 0.1 10*3/uL (ref 0.0–0.7)
EOS PCT: 1 % (ref 0–5)
HCT: 33.6 % — ABNORMAL LOW (ref 36.0–46.0)
Hemoglobin: 11.4 g/dL — ABNORMAL LOW (ref 12.0–15.0)
Lymphocytes Relative: 9 % — ABNORMAL LOW (ref 12–46)
Lymphs Abs: 1.1 10*3/uL (ref 0.7–4.0)
MCH: 30.9 pg (ref 26.0–34.0)
MCHC: 33.9 g/dL (ref 30.0–36.0)
MCV: 91.1 fL (ref 78.0–100.0)
Monocytes Absolute: 1.2 10*3/uL — ABNORMAL HIGH (ref 0.1–1.0)
Monocytes Relative: 10 % (ref 3–12)
NEUTROS PCT: 80 % — AB (ref 43–77)
Neutro Abs: 9.6 10*3/uL — ABNORMAL HIGH (ref 1.7–7.7)
PLATELETS: 276 10*3/uL (ref 150–400)
RBC: 3.69 MIL/uL — ABNORMAL LOW (ref 3.87–5.11)
RDW: 14.1 % (ref 11.5–15.5)
WBC: 12 10*3/uL — ABNORMAL HIGH (ref 4.0–10.5)

## 2014-07-03 LAB — COMPREHENSIVE METABOLIC PANEL
ALBUMIN: 3.3 g/dL — AB (ref 3.5–5.2)
ALT: 40 U/L — ABNORMAL HIGH (ref 0–35)
AST: 39 U/L — AB (ref 0–37)
Alkaline Phosphatase: 96 U/L (ref 39–117)
Anion gap: 13 (ref 5–15)
BUN: 28 mg/dL — ABNORMAL HIGH (ref 6–23)
CALCIUM: 10.7 mg/dL — AB (ref 8.4–10.5)
CO2: 25 mEq/L (ref 19–32)
Chloride: 96 mEq/L (ref 96–112)
Creatinine, Ser: 1.36 mg/dL — ABNORMAL HIGH (ref 0.50–1.10)
GFR calc non Af Amer: 40 mL/min — ABNORMAL LOW (ref >90)
GFR, EST AFRICAN AMERICAN: 47 mL/min — AB (ref >90)
GLUCOSE: 133 mg/dL — AB (ref 70–99)
POTASSIUM: 4.4 meq/L (ref 3.7–5.3)
SODIUM: 134 meq/L — AB (ref 137–147)
TOTAL PROTEIN: 7.3 g/dL (ref 6.0–8.3)
Total Bilirubin: 0.4 mg/dL (ref 0.3–1.2)

## 2014-07-03 MED ORDER — HEPARIN SOD (PORK) LOCK FLUSH 100 UNIT/ML IV SOLN
500.0000 [IU] | Freq: Once | INTRAVENOUS | Status: AC | PRN
  Administered 2014-07-03: 500 [IU]

## 2014-07-03 MED ORDER — SODIUM CHLORIDE 0.9 % IV SOLN
3.6000 mg/kg | Freq: Once | INTRAVENOUS | Status: AC
  Administered 2014-07-03: 320 mg via INTRAVENOUS
  Filled 2014-07-03: qty 16

## 2014-07-03 MED ORDER — SODIUM CHLORIDE 0.9 % IV SOLN
Freq: Once | INTRAVENOUS | Status: AC
  Administered 2014-07-03: 11:00:00 via INTRAVENOUS

## 2014-07-03 MED ORDER — ACETAMINOPHEN 325 MG PO TABS: 650.0000 mg | ORAL_TABLET | Freq: Once | ORAL | Status: DC

## 2014-07-03 MED ORDER — SODIUM CHLORIDE 0.9 % IJ SOLN: 10.0000 mL | INTRAMUSCULAR | Status: DC | PRN

## 2014-07-03 MED ORDER — HEPARIN SOD (PORK) LOCK FLUSH 100 UNIT/ML IV SOLN
INTRAVENOUS | Status: AC
  Filled 2014-07-03: qty 5

## 2014-07-03 MED ORDER — FENTANYL 75 MCG/HR TD PT72: 75.0000 ug | MEDICATED_PATCH | TRANSDERMAL | Status: DC

## 2014-07-03 MED ORDER — DIPHENHYDRAMINE HCL 25 MG PO CAPS: 50.0000 mg | ORAL_CAPSULE | Freq: Once | ORAL | Status: DC

## 2014-07-03 MED ORDER — OXYCODONE HCL 10 MG PO TABS: ORAL_TABLET | ORAL | Status: DC

## 2014-07-03 NOTE — Patient Instructions (Signed)
[facility] Discharge Instructions for Patients Receiving Chemotherapy  Today you received the following chemotherapy agents Kadcyla Follow up in 3 weeks with doctor and chemotherapy appt. Oxycodone prescription given   Please call the clinic if you have any questions or concerns  To help prevent nausea and vomiting after your treatment, we encourage you to take your nausea medication   If you develop nausea and vomiting that is not controlled by your nausea medication, call the clinic.   BELOW ARE SYMPTOMS THAT SHOULD BE REPORTED IMMEDIATELY:  *FEVER GREATER THAN 100.5 F  *CHILLS WITH OR WITHOUT FEVER  NAUSEA AND VOMITING THAT IS NOT CONTROLLED WITH YOUR NAUSEA MEDICATION  *UNUSUAL SHORTNESS OF BREATH  *UNUSUAL BRUISING OR BLEEDING  TENDERNESS IN MOUTH AND THROAT WITH OR WITHOUT PRESENCE OF ULCERS  *URINARY PROBLEMS  *BOWEL PROBLEMS  UNUSUAL RASH Items with * indicate a potential emergency and should be followed up as soon as possible.  Feel free to call the clinic you have any questions or concerns. The clinic phone number is [phone].

## 2014-07-03 NOTE — Progress Notes (Signed)
Labs for cbcd,cmp 

## 2014-07-03 NOTE — Progress Notes (Signed)
Erika Nunez Tolerated chemotherapy well today

## 2014-07-03 NOTE — Patient Instructions (Signed)
[place] Discharge Instructions  RECOMMENDATIONS MADE BY THE CONSULTANT AND ANY TEST RESULTS WILL BE SENT TO YOUR REFERRING PHYSICIAN.  MEDICATIONS PRESCRIBED:  Refill on Oxycodone pain medication. Increased dose of Fentanyl patches to 75 mcg/hr  INSTRUCTIONS GIVEN AND DISCUSSED: Continue with stool softeners.  Please call with constipation  SPECIAL INSTRUCTIONS/FOLLOW-UP: Return in 3 weeks for follow-up and chemotherapy.  Thank you for choosing [facility] to provide your oncology and hematology care.  To afford each patient quality time with our providers, please arrive at least 15 minutes before your scheduled appointment time.  With your help, our goal is to use those 15 minutes to complete the necessary work-up to ensure our physicians have the information they need to help with your evaluation and healthcare recommendations.    Effective January 1st, 2014, we ask that you re-schedule your appointment with our physicians should you arrive 10 or more minutes late for your appointment.  We strive to give you quality time with our providers, and arriving late affects you and other patients whose appointments are after yours.    Again, thank you for choosing [facility].  Our hope is that these requests will decrease the amount of time that you wait before being seen by our physicians.       _____________________________________________________________  Should you have questions after your visit to [facility], please contact our office at [phone] between the hours of 8:30 a.m. and 5:00 p.m.  Voicemails left after 4:30 p.m. will not be returned until the following business day.  For prescription refill requests, have your pharmacy contact our office with your prescription refill request.

## 2014-07-08 ENCOUNTER — Other Ambulatory Visit: Payer: Medicaid Other

## 2014-07-10 ENCOUNTER — Ambulatory Visit: Payer: Medicaid Other | Admitting: Radiation Oncology

## 2014-07-18 ENCOUNTER — Ambulatory Visit: Payer: MEDICAID | Attending: Family Medicine

## 2014-07-18 ENCOUNTER — Other Ambulatory Visit: Payer: Self-pay

## 2014-07-22 ENCOUNTER — Ambulatory Visit: Payer: MEDICAID | Admitting: Radiation Oncology

## 2014-07-22 ENCOUNTER — Ambulatory Visit: Payer: MEDICAID

## 2014-07-24 ENCOUNTER — Inpatient Hospital Stay (HOSPITAL_COMMUNITY): Payer: Medicaid Other

## 2014-07-24 ENCOUNTER — Ambulatory Visit (HOSPITAL_COMMUNITY): Payer: Medicaid Other

## 2014-07-24 ENCOUNTER — Encounter (HOSPITAL_COMMUNITY): Payer: Self-pay | Admitting: Oncology

## 2014-07-24 NOTE — Progress Notes (Signed)
[place]  OFFICE PROGRESS NOTE  [name], MD [name] [place] [address]  DIAGNOSIS: Breast cancer, right - Plan: CBC with Differential, Comprehensive metabolic panel  Metastatic cancer to brain - Plan: CBC with Differential, Comprehensive metabolic panel  Skin metastases - Plan: CBC with Differential, Comprehensive metabolic panel  No chief complaint on file.   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 Anastrozole was stopped on 04/24/2014. Brain recurrence was triple negative and chest wall recurrence was HER-2/neu positive, ER/PR negative. Brain recurrence occurred 4 months after conclusion of pertuzumab/trastuzumab given out to one year  INTERVAL HISTORY: Erika Nunez 63 y.o. female returns for followup in continuation of treatment for  metastatic breast cancer, HER-2/neu overexpressed involving the right chest wall, status post brain metastases, status post cerebellar resection followed by [facility] radiotherapy to the brain lesion area on 03/29/2014 receiving 15 Gy.  She is here today to receive cycle #5 of therapy with Kadcyla.  MEDICAL HISTORY: Past Medical History  Diagnosis Date  . Hypertension   . High cholesterol   . Cancer   . Breast cancer   . Allergy     seasonal allergies  . Arthritis   . Anxiety   . Breast mass, left 2014  . Breast mass, right 2014  . 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 Depression   . Lymphedema of arm     right arm    INTERIM HISTORY: has Breast cancer; Hypertension; Thrombocytopenia, unspecified; Antineoplastic chemotherapy induced pancytopenia; Lymphedema  of upper extremity following lymphadenectomy; Delayed postoperative wound closure; Obstructive hydrocephalus; Vasogenic brain edema; Cerebellar mass; Metastatic cancer to brain; and Metastasis from breast cancer on her problem list.     Breast cancer   10/02/2012 Initial Diagnosis Breast cancer, Her2+, ER-, PR-   10/23/2012 - 02/12/2013 Chemotherapy TCH x 6 cycles   03/05/2013 - 11/05/2013 Chemotherapy Herceptin/Perjeta   06/04/2013 - 04/24/2014 Chemotherapy Arimidex daily   02/20/2014 Relapse/Recurrence CT head- New mass lesion involving the right cerebellar hemisphere with associated vasogenic edema   02/25/2014 Definitive Surgery 1. Suboccipital craniectomy 2. Resection of tumor   3. Use of intraoperative microscope for microdissection    02/25/2014 Pathology Results 1. Brain, for tumor resection, Right cerebellar- metastatic carcinoma 2. Brain, for tumor resection, Right cerebellar- metastatic carcinoma. 3. Brain, for tumor resection, Right cerebellar- metastatic carcinoma.  HER2-. ER/PR 0%, Ki-67 92%.   04/01/2014 Relapse/Recurrence PET- Marked hypermetabolism associated with the abnormal and irregular thickening of the skin over the mid and lower right lateral and post for lateral chest wall.   04/15/2014 Procedure Soft tissue mass, biopsy, right chest wall by Dr. Marlou Starks showing invasive carcinoma with LVI.  HER2+, ER 2%, PR 0%, Ki-67 87%.   05/01/2014 -  Chemotherapy Kadcyla 3.6 mg/kg every 21 days    ALLERGIES:  is allergic to codeine and famvir.  MEDICATIONS: has a current medication list which includes the following prescription(s): ado-trastuzumab emtansine, fentanyl, gabapentin, ibuprofen, lidocaine-prilocaine, lisinopril, lisinopril-hydrochlorothiazide, lorazepam, magnesium oxide, ondansetron, oxycodone hcl, potassium chloride sa, pravastatin, and sennosides-docusate sodium.  SURGICAL HISTORY:  Past Surgical History  Procedure Laterality Date  . Cholecystectomy    .  Tubal ligation    .  Tonsillectomy    . Breast biopsy  10/02/2012    Procedure: BREAST BIOPSY;  Surgeon: Donato Heinz, MD;  Location: [facility];  Service: General;  Laterality: Right;  . Portacath placement Left 10/20/2012  . Breast biopsy Left 10/20/2012    Procedure: BREAST BIOPSY;  Surgeon: Donato Heinz, MD;  Location: [facility];  Service: General;  Laterality: Left;  Procedure end 1142  . Portacath placement Left 10/20/2012    Procedure: INSERTION PORT-A-CATH;  Surgeon: Donato Heinz, MD;  Location: [facility];  Service: General;  Laterality: Left;  Procedure began 2706; left subclavian  . Simple mastectomy with axillary sentinel node biopsy Bilateral 04/25/2013    Procedure: SIMPLE MASTECTOMY;  Surgeon: Donato Heinz, MD;  Location: [facility];  Service: General;  Laterality: Bilateral;  . Craniotomy Right 02/26/2014    Procedure: suboccipital craniotomy for tumor resection;  Surgeon: Consuella Lose, MD;  Location: [place] [facility];  Service: Neurosurgery;  Laterality: Right;  suboccipital craniotomy for tumor resection  . Skin biopsy Right 04/15/2014    right lateral chest    FAMILY HISTORY: family history includes COPD in her daughter; Cancer in her maternal aunt, maternal aunt, and maternal grandmother; Heart attack in her father; Stroke in her maternal uncle.  SOCIAL HISTORY:  reports that she has never smoked. She has never used smokeless tobacco. She reports that she drinks about 0.6 oz of alcohol per week. She reports that she does not use illicit drugs.  REVIEW OF SYSTEMS:  Other than that discussed above is noncontributory.  PHYSICAL EXAMINATION: ECOG PERFORMANCE STATUS: 0 - Asymptomatic  There were no vitals taken for this visit.  GENERAL:alert, no distress and comfortable SKIN: skin color, texture, turgor are normal, no rashes or significant lesions EYES: PERLA; Conjunctiva are pink and non-injected, sclera clear SINUSES: No redness or tenderness over maxillary or ethmoid sinuses OROPHARYNX:no exudate, no  erythema on lips, buccal mucosa, or tongue. NECK: supple, thyroid normal size, non-tender, without nodularity. No masses CHE LYMPH:  no palpable lymphadenopathy in the cervical, axillary or inguinal LUNGS: clear to auscultation and percussion with normal breathing effort HEART: regular rate & rhythm and no murmurs. ABDOMEN:abdomen soft, non-tender and normal bowel sounds MUSCULOSKELETAL:no cyanosis of digits and no clubbing. Range of motion normal.  NEURO: alert & oriented x 3 with fluent speech, no focal motor/sensory deficits   LABORATORY DATA: Infusion on 07/03/2014  Component Date Value Ref Range Status  . WBC 07/03/2014 12.0* 4.0 - 10.5 K/uL Final  . RBC 07/03/2014 3.69* 3.87 - 5.11 MIL/uL Final  . Hemoglobin 07/03/2014 11.4* 12.0 - 15.0 g/dL Final  . HCT 07/03/2014 33.6* 36.0 - 46.0 % Final  . MCV 07/03/2014 91.1  78.0 - 100.0 fL Final  . MCH 07/03/2014 30.9  26.0 - 34.0 pg Final  . MCHC 07/03/2014 33.9  30.0 - 36.0 g/dL Final  . RDW 07/03/2014 14.1  11.5 - 15.5 % Final  . Platelets 07/03/2014 276  150 - 400 K/uL Final  . Neutrophils Relative % 07/03/2014 80* 43 - 77 % Final  . Neutro Abs 07/03/2014 9.6* 1.7 - 7.7 K/uL Final  . Lymphocytes Relative 07/03/2014 9* 12 - 46 % Final  . Lymphs Abs 07/03/2014 1.1  0.7 - 4.0 K/uL Final  . Monocytes Relative 07/03/2014 10  3 - 12 % Final  . Monocytes Absolute 07/03/2014 1.2* 0.1 - 1.0 K/uL Final  . Eosinophils Relative 07/03/2014 1  0 - 5 % Final  .  Eosinophils Absolute 07/03/2014 0.1  0.0 - 0.7 K/uL Final  . Basophils Relative 07/03/2014 0  0 - 1 % Final  . Basophils Absolute 07/03/2014 0.0  0.0 - 0.1 K/uL Final  . Sodium 07/03/2014 134* 137 - 147 mEq/L Final  . Potassium 07/03/2014 4.4  3.7 - 5.3 mEq/L Final  . Chloride 07/03/2014 96  96 - 112 mEq/L Final  . CO2 07/03/2014 25  19 - 32 mEq/L Final  . Glucose, Bld 07/03/2014 133* 70 - 99 mg/dL Final  . BUN 07/03/2014 28* 6 - 23 mg/dL Final  . Creatinine, Ser 07/03/2014 1.36* 0.50  - 1.10 mg/dL Final  . Calcium 07/03/2014 10.7* 8.4 - 10.5 mg/dL Final  . Total Protein 07/03/2014 7.3  6.0 - 8.3 g/dL Final  . Albumin 07/03/2014 3.3* 3.5 - 5.2 g/dL Final  . AST 07/03/2014 39* 0 - 37 U/L Final  . ALT 07/03/2014 40* 0 - 35 U/L Final  . Alkaline Phosphatase 07/03/2014 96  39 - 117 U/L Final  . Total Bilirubin 07/03/2014 0.4  0.3 - 1.2 mg/dL Final  . GFR calc non Af Amer 07/03/2014 40* >90 mL/min Final  . GFR calc Af Amer 07/03/2014 47* >90 mL/min Final   Comment: (NOTE)                          The eGFR has been calculated using the CKD EPI equation.                          This calculation has not been validated in all clinical situations.                          eGFR's persistently <90 mL/min signify possible Chronic Kidney                          Disease.  . Anion gap 07/03/2014 13  5 - 15 Final    PATHOLOGY:  Urinalysis No results found for: COLORURINE, APPEARANCEUR, LABSPEC, PHURINE, GLUCOSEU, HGBUR, BILIRUBINUR, KETONESUR, PROTEINUR, UROBILINOGEN, NITRITE, LEUKOCYTESUR  RADIOGRAPHIC STUDIES: No results found. Wo Contrast   Status: Final result       PACS Images     Show images for MR Brain W Wo Contrast     Study Result     CLINICAL DATA: S RS targeting. Breast cancer with tumor resection 03/09/2014.  EXAM:(03/27/2014) MRI HEAD WITHOUT AND WITH CONTRAST  TECHNIQUE: Multiplanar, multiecho pulse sequences of the brain and surrounding structures were obtained without and with intravenous contrast.  CONTRAST: 4m MULTIHANCE GADOBENATE DIMEGLUMINE 529 MG/ML IV SOLN  COMPARISON: 02/26/2014. 02/20/2014.  FINDINGS: There has been contraction of the postoperative cavity in the right posterior fossa. Enhancing tissue along the anterior margin of the resection has become slightly more bulky. The largest and most easily measurable component measures approximately 17 x 9 x 9 mm, but I think this tissue is largely present along the  anterior margin of the resection any somewhat lobular fashion. There is less edema within the right cerebellum, but some vasogenic edema does persist. No mass effect.  No second more distant metastatic lesion is evident. I think there are a few slightly prominent leptomeningeal vessels in the right occipital lobe that were probably present previously and are not significant. No hydrocephalus. No other extra-axial collection. No pituitary mass. No inflammatory sinus disease. Small amount of  fluid in the mastoid air cells, not significant. No skull or skullbase lesion.  IMPRESSION: Post resected space in the region of the right cerebellum is contracting as expected.  Enhancing tissue along the anterior margin of the resection is growing, consistent with residual tumor. This is largely present along the entire anterior margin of the resection cavity. The easiest measurable component measures approximately 17 x 9 x 9 mm. Mild right cerebellar edema.   Electronically Signed  By: Nelson Chimes M.D.  On: 03/27/2014    Restag (PS) Skull Base To Thigh   Status: Final result       PACS Images     Show images for NM PET Image Restag (PS) Skull Base To Thigh     Study Result     CLINICAL DATA: Subsequent Treatment strategy for breast cancer.  EXAM: NUCLEAR MEDICINE PET SKULL BASE TO THIGH  TECHNIQUE: 10.2 mCi F-18 FDG was injected intravenously. Full-ring PET imaging was performed from the skull base to thigh after the radiotracer. CT data was obtained and used for attenuation correction and anatomic localization.  FASTING BLOOD GLUCOSE: Value: 108 mg/dl  COMPARISON: 03/13/2013  FINDINGS: NECK  patient has had a occipital craniectomy on the right each. There is some hypermetabolism in the soft tissues overlying the craniectomy defect, presumably representing scar/granulation tissue from the surgery.  CHEST  Patient has had interval bilateral  mastectomy. Low level, diffuse FDG uptake in the mastectomy dense is felt to be postsurgical. Left Port-A-Cath tip projects at the proximal SVC level, just distal to the innominate vein confluence. 8 x 19 mm soft tissue focus in the right axilla was 32 x 14 mm previously. This area shows low level FDG uptake with SUV max = 2.3.  There is irregular skin thickening in the right lateral in post for lateral lower chest wall. This area is intensely hypermetabolic with SUV max = 6.8.  ABDOMEN/PELVIS  No abnormal hypermetabolic activity within the liver, pancreas, adrenal glands, or spleen. No hypermetabolic lymph nodes in the abdomen or pelvis.  There is fluid in the endometrial canal, abnormal in a postmenopausal female, but there is no associated hypermetabolism on the PET images.  SKELETON  No focal hypermetabolic activity to suggest skeletal metastasis.  IMPRESSION: Interval bilateral mastectomy with low level diffuse uptake in each mastectomy bed, likely postsurgical.  Small focus of soft tissue attenuation in the right axilla with borderline uptake on today's study. This may be granulation tissue/ scarring although metastatic disease to the right axilla was not completely excluded.  Marked hypermetabolism associated with the abnormal and irregular thickening of the skin over the mid and lower right lateral and post for lateral chest wall. This presumably correlates to the patient's recent diagnosis of shingles although it is unclear from the electronic medical record whether the shingles outbreak was on the right or the left.   Electronically Signed  By: Misty Stanley M.D.  On: 04/01/2014     ASSESSMENT:    PLAN:    All questions were answered. The patient knows to call the clinic with any problems, questions or concerns. We can certainly see the patient much sooner if necessary.   I spent 5 minutes counseling the patient face to face. The total  time spent in the appointment was 5 minutes.    Doroteo Bradford, MD 07/24/2014 9:37 AM  DISCLAIMER:  This note was dictated with voice recognition software.  Similar sounding words can inadvertently be transcribed inaccurately and may not be corrected upon review.  This encounter was created in error - please disregard. This encounter was created in error - please disregard. This encounter was created in error - please disregard. This encounter was created in error - please disregard.

## 2014-08-09 ENCOUNTER — Other Ambulatory Visit (HOSPITAL_COMMUNITY): Payer: Self-pay | Admitting: Oncology

## 2014-08-11 NOTE — Progress Notes (Signed)
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 Anastrozole was stopped on 04/24/2014. Brain recurrence was triple negative and chest wall recurrence was HER-2/neu positive, ER/PR negative. Brain recurrence occurred 4 months after conclusion of pertuzumab/trastuzumab given out to one year  INTERVAL HISTORY: Erika Nunez 63 y.o. female returns for  regular  visit for followup of metastatic breast cancer, HER-2/neu overexpressed involving the right chest wall, status post brain metastases, status post cerebellar resection followed by [facility] radiotherapy to the brain lesion area on 03/29/2014 receiving 15 Gy. Now on Kadcyla.    Breast cancer   10/02/2012 Initial Diagnosis Breast cancer, Her2+, ER-, PR-   10/23/2012 - 02/12/2013 Chemotherapy TCH x 6 cycles   03/05/2013 - 11/05/2013 Chemotherapy Herceptin/Perjeta   06/04/2013 - 04/24/2014 Chemotherapy Arimidex daily   02/20/2014 Relapse/Recurrence CT head- New mass lesion involving the right cerebellar hemisphere with associated vasogenic edema   02/25/2014 Definitive Surgery 1. Suboccipital craniectomy 2. Resection of tumor   3. Use of intraoperative microscope for microdissection    02/25/2014 Pathology Results 1. Brain, for tumor resection, Right cerebellar- metastatic carcinoma 2. Brain, for tumor resection, Right cerebellar- metastatic carcinoma. 3. Brain, for tumor resection, Right cerebellar- metastatic carcinoma.  HER2-. ER/PR 0%, Ki-67 92%.   04/01/2014 Relapse/Recurrence PET- Marked hypermetabolism associated  with the abnormal and irregular thickening of the skin over the mid and lower right lateral and post for lateral chest wall.   04/15/2014 Procedure Soft tissue mass, biopsy, right chest wall by Dr. Marlou Starks showing invasive carcinoma with LVI.  HER2+, ER 2%, PR 0%, Ki-67 87%.   05/01/2014 -  Chemotherapy Kadcyla 3.6 mg/kg every 21 days   I personally reviewed and went over laboratory results with the patient.  The results are noted within this dictation.  She missed her last treatment because she came down with a cold.  Therefore, she did not get treated as planned 3 weeks ago.  She notes that the pain medication is still effective for her. She requests a refill today and this was completed on 08/12/2014 when a family member contacted the clinic requesting a refill.  The medications prescriptions were printed and kept in the [facility] Rx drawer for pick-up.  They got them today.  She reports that she thinks the subcutaneous lesions are improved.  She is gracious enough to provide consent for photography.  I was able to compare today to her photo on 07/03/2014 and I agree that her chest wall involvement appears improved.  It is less erythematous and some areas are smaller.  This is a good clinical indication that she is demonstrating a response to therapy.  Otherwise, she denies any complaints and ROS questioning is negative.  Past Medical History  Diagnosis Date  . Hypertension   . High cholesterol   . Cancer   . Breast cancer   . Allergy     seasonal allergies  . Arthritis   . Anxiety   . Breast mass, left 2014  . Breast mass, right 2014  . 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  Depression   . Lymphedema of arm     right arm    has Breast cancer; Hypertension; Thrombocytopenia, unspecified; Antineoplastic chemotherapy induced pancytopenia; Lymphedema of upper extremity following lymphadenectomy; Delayed postoperative wound closure; Obstructive hydrocephalus; Vasogenic  brain edema; Cerebellar mass; Metastatic cancer to brain; and Metastasis from breast cancer on her problem list.     is allergic to codeine and famvir.  Ms. Mickelson does not currently have medications on file.  Past Surgical History  Procedure Laterality Date  . Cholecystectomy    . Tubal ligation    . Tonsillectomy    . Breast biopsy  10/02/2012    Procedure: BREAST BIOPSY;  Surgeon: Donato Heinz, MD;  Location: [facility];  Service: General;  Laterality: Right;  . Portacath placement Left 10/20/2012  . Breast biopsy Left 10/20/2012    Procedure: BREAST BIOPSY;  Surgeon: Donato Heinz, MD;  Location: [facility];  Service: General;  Laterality: Left;  Procedure end 1142  . Portacath placement Left 10/20/2012    Procedure: INSERTION PORT-A-CATH;  Surgeon: Donato Heinz, MD;  Location: [facility];  Service: General;  Laterality: Left;  Procedure began 1700; left subclavian  . Simple mastectomy with axillary sentinel node biopsy Bilateral 04/25/2013    Procedure: SIMPLE MASTECTOMY;  Surgeon: Donato Heinz, MD;  Location: [facility];  Service: General;  Laterality: Bilateral;  . Craniotomy Right 02/26/2014    Procedure: suboccipital craniotomy for tumor resection;  Surgeon: Consuella Lose, MD;  Location: [place] [facility];  Service: Neurosurgery;  Laterality: Right;  suboccipital craniotomy for tumor resection  . Skin biopsy Right 04/15/2014    right lateral chest    Denies any headaches, dizziness, double vision, fevers, chills, night sweats, nausea, vomiting, diarrhea, constipation, chest pain, heart palpitations, shortness of breath, blood in stool, black tarry stool, urinary pain, urinary burning, urinary frequency, hematuria.   PHYSICAL EXAMINATION  ECOG PERFORMANCE STATUS: 1 - Symptomatic but completely ambulatory  Filed Vitals:   08/14/14 1000  BP: 104/79  Pulse: 95  Temp: 97.8 F (36.6 C)  Resp: 18    GENERAL:alert, no distress, well nourished, well developed, comfortable, cooperative, obese  and smiling SKIN: skin color, texture, turgor are normal, no rashes or significant lesions HEAD: Normocephalic, No masses, lesions, tenderness or abnormalities EYES: normal, PERRLA, EOMI, Conjunctiva are pink and non-injected EARS: External ears normal OROPHARYNX:mucous membranes are moist  NECK: supple, no adenopathy, trachea midline LYMPH:  no palpable lymphadenopathy BREAST:not examined LUNGS: clear to auscultation  HEART: regular rate & rhythm, no murmurs and no gallops ABDOMEN:abdomen soft, non-tender, obese, normal bowel sounds and chest wall involvement as illustrated in photographs below. BACK: Back symmetric, no curvature. EXTREMITIES:less then 2 second capillary refill, no joint deformities, effusion, or inflammation, no edema, no skin discoloration, no clubbing, no cyanosis  NEURO: alert & oriented x 3 with fluent speech, no focal motor/sensory deficits, gait normal    08/14/2014      07/03/2014      LABORATORY DATA: CBC    Component Value Date/Time   WBC 12.0* 07/03/2014 1114   RBC 3.69* 07/03/2014 1114   RBC 3.53* 10/03/2012 0832   HGB 11.4* 07/03/2014 1114   HCT 33.6* 07/03/2014 1114   PLT 276 07/03/2014 1114   MCV 91.1 07/03/2014 1114   MCH 30.9 07/03/2014 1114   MCHC 33.9 07/03/2014 1114   RDW 14.1 07/03/2014 1114   LYMPHSABS 1.1 07/03/2014 1114   MONOABS 1.2* 07/03/2014 1114   EOSABS 0.1 07/03/2014 1114   BASOSABS  0.0 07/03/2014 1114      Chemistry      Component Value Date/Time   NA 134* 07/03/2014 1114   K 4.4 07/03/2014 1114   CL 96 07/03/2014 1114   CO2 25 07/03/2014 1114   BUN 28* 07/03/2014 1114   CREATININE 1.36* 07/03/2014 1114      Component Value Date/Time   CALCIUM 10.7* 07/03/2014 1114   ALKPHOS 96 07/03/2014 1114   AST 39* 07/03/2014 1114   ALT 40* 07/03/2014 1114   BILITOT 0.4 07/03/2014 1114     Lab Results  Component Value Date   LABCA2 9 05/01/2014      ASSESSMENT:  1. Stage IV breast cancer with brain and  right chest wall metastases, brain lesion is ER/PR negative, HER-2/neu negative; right chest wall lesion is ER weakly positive, PR negative, HER-2/neu overexpressed, relapse chest wall 4 months at the conclusion of combination pertuzumab/trastuzumab and antibody therapy. 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 On TDM1 treatment (Kadcyla).  2. Right thoracic metastatic disease 3. Right upper extremity lymphedema, wearing a sleeve.  4. Peripheral neuropathy, on gabapentin.  Patient Active Problem List   Diagnosis Date Noted  . 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 Metastatic cancer to brain 02/26/2014  . Cerebellar mass 02/21/2014  . Obstructive hydrocephalus 02/20/2014  . Vasogenic brain edema 02/20/2014  . Lymphedema of upper extremity following lymphadenectomy 06/05/2013  . Delayed postoperative wound closure 06/05/2013  . Thrombocytopenia, unspecified 01/04/2013  . 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 Hypertension 12/31/2012  . Breast cancer 10/12/2012     PLAN:  1. I personally reviewed and went over laboratory results with the patient.  The results are noted within this dictation. 2. Pre-chemo labs as planned 3. Treatment today as ordered 4. Refill on Fentanyl patches today 5. Refill on Oxycodone today 6. Consent ascertained for photography to monitor disease and for comparative reasons.  Photographs appear in dictation above. 7. Return in 3 weeks for follow-up   THERAPY PLAN:  We will continue Kadcyla as planned and manage toxicities if/when they arise.   All questions were answered. The patient knows to call the clinic with any problems, questions or concerns. We can certainly see the patient much sooner if necessary.  Patient and plan discussed with Dr. Farrel Gobble and he is in agreement with  the aforementioned.   Duane Earnshaw 08/14/2014

## 2014-08-12 ENCOUNTER — Other Ambulatory Visit (HOSPITAL_COMMUNITY): Payer: Self-pay | Admitting: Oncology

## 2014-08-12 DIAGNOSIS — C50911 Malignant neoplasm of unspecified site of right female breast: Secondary | ICD-10-CM

## 2014-08-12 DIAGNOSIS — C7931 Secondary malignant neoplasm of brain: Secondary | ICD-10-CM

## 2014-08-12 DIAGNOSIS — C799 Secondary malignant neoplasm of unspecified site: Secondary | ICD-10-CM

## 2014-08-12 DIAGNOSIS — C50919 Malignant neoplasm of unspecified site of unspecified female breast: Secondary | ICD-10-CM

## 2014-08-12 MED ORDER — FENTANYL 75 MCG/HR TD PT72: 75.0000 ug | MEDICATED_PATCH | TRANSDERMAL | Status: DC

## 2014-08-12 MED ORDER — OXYCODONE HCL 10 MG PO TABS: ORAL_TABLET | ORAL | Status: DC

## 2014-08-14 ENCOUNTER — Encounter (HOSPITAL_COMMUNITY): Payer: Self-pay | Attending: Hematology and Oncology

## 2014-08-14 ENCOUNTER — Encounter (HOSPITAL_BASED_OUTPATIENT_CLINIC_OR_DEPARTMENT_OTHER): Payer: Self-pay | Admitting: Oncology

## 2014-08-14 ENCOUNTER — Encounter (HOSPITAL_COMMUNITY): Payer: Self-pay | Admitting: Oncology

## 2014-08-14 VITALS — BP 104/79 | HR 95 | Temp 97.8°F | Resp 18 | Wt 186.8 lb

## 2014-08-14 VITALS — BP 103/56 | HR 79 | Temp 98.1°F | Resp 18

## 2014-08-14 DIAGNOSIS — Z79891 Long term (current) use of opiate analgesic: Secondary | ICD-10-CM | POA: Insufficient documentation

## 2014-08-14 DIAGNOSIS — C7931 Secondary malignant neoplasm of brain: Secondary | ICD-10-CM

## 2014-08-14 DIAGNOSIS — C799 Secondary malignant neoplasm of unspecified site: Secondary | ICD-10-CM

## 2014-08-14 DIAGNOSIS — C7951 Secondary malignant neoplasm of bone: Secondary | ICD-10-CM

## 2014-08-14 DIAGNOSIS — C50919 Malignant neoplasm of unspecified site of unspecified female breast: Secondary | ICD-10-CM

## 2014-08-14 DIAGNOSIS — G629 Polyneuropathy, unspecified: Secondary | ICD-10-CM

## 2014-08-14 DIAGNOSIS — C50911 Malignant neoplasm of unspecified site of right female breast: Secondary | ICD-10-CM

## 2014-08-14 DIAGNOSIS — C7989 Secondary malignant neoplasm of other specified sites: Secondary | ICD-10-CM

## 2014-08-14 DIAGNOSIS — I890 Lymphedema, not elsewhere classified: Secondary | ICD-10-CM | POA: Insufficient documentation

## 2014-08-14 DIAGNOSIS — I10 Essential (primary) hypertension: Secondary | ICD-10-CM | POA: Insufficient documentation

## 2014-08-14 DIAGNOSIS — Z923 Personal history of irradiation: Secondary | ICD-10-CM | POA: Insufficient documentation

## 2014-08-14 DIAGNOSIS — Z5112 Encounter for antineoplastic immunotherapy: Secondary | ICD-10-CM

## 2014-08-14 DIAGNOSIS — Z9221 Personal history of antineoplastic chemotherapy: Secondary | ICD-10-CM | POA: Insufficient documentation

## 2014-08-14 DIAGNOSIS — Z171 Estrogen receptor negative status [ER-]: Secondary | ICD-10-CM | POA: Insufficient documentation

## 2014-08-14 DIAGNOSIS — Z5111 Encounter for antineoplastic chemotherapy: Secondary | ICD-10-CM

## 2014-08-14 DIAGNOSIS — D0590 Unspecified type of carcinoma in situ of unspecified breast: Secondary | ICD-10-CM

## 2014-08-14 DIAGNOSIS — Z9049 Acquired absence of other specified parts of digestive tract: Secondary | ICD-10-CM | POA: Insufficient documentation

## 2014-08-14 DIAGNOSIS — Z9013 Acquired absence of bilateral breasts and nipples: Secondary | ICD-10-CM | POA: Insufficient documentation

## 2014-08-14 DIAGNOSIS — Z79899 Other long term (current) drug therapy: Secondary | ICD-10-CM | POA: Insufficient documentation

## 2014-08-14 LAB — CBC WITH DIFFERENTIAL/PLATELET
BASOS ABS: 0 10*3/uL (ref 0.0–0.1)
BASOS PCT: 0 % (ref 0–1)
Eosinophils Absolute: 0.1 10*3/uL (ref 0.0–0.7)
Eosinophils Relative: 2 % (ref 0–5)
HCT: 29.6 % — ABNORMAL LOW (ref 36.0–46.0)
Hemoglobin: 9.9 g/dL — ABNORMAL LOW (ref 12.0–15.0)
LYMPHS PCT: 11 % — AB (ref 12–46)
Lymphs Abs: 0.9 10*3/uL (ref 0.7–4.0)
MCH: 30 pg (ref 26.0–34.0)
MCHC: 33.4 g/dL (ref 30.0–36.0)
MCV: 89.7 fL (ref 78.0–100.0)
MONO ABS: 0.7 10*3/uL (ref 0.1–1.0)
Monocytes Relative: 8 % (ref 3–12)
NEUTROS PCT: 79 % — AB (ref 43–77)
Neutro Abs: 6.5 10*3/uL (ref 1.7–7.7)
PLATELETS: 263 10*3/uL (ref 150–400)
RBC: 3.3 MIL/uL — ABNORMAL LOW (ref 3.87–5.11)
RDW: 15.4 % (ref 11.5–15.5)
WBC: 8.3 10*3/uL (ref 4.0–10.5)

## 2014-08-14 LAB — COMPREHENSIVE METABOLIC PANEL
ALBUMIN: 3.3 g/dL — AB (ref 3.5–5.2)
ALT: 22 U/L (ref 0–35)
AST: 23 U/L (ref 0–37)
Alkaline Phosphatase: 91 U/L (ref 39–117)
Anion gap: 14 (ref 5–15)
BILIRUBIN TOTAL: 0.3 mg/dL (ref 0.3–1.2)
BUN: 16 mg/dL (ref 6–23)
CHLORIDE: 100 meq/L (ref 96–112)
CO2: 23 meq/L (ref 19–32)
CREATININE: 1.34 mg/dL — AB (ref 0.50–1.10)
Calcium: 10.1 mg/dL (ref 8.4–10.5)
GFR calc Af Amer: 47 mL/min — ABNORMAL LOW (ref >90)
GFR calc non Af Amer: 41 mL/min — ABNORMAL LOW (ref >90)
Glucose, Bld: 102 mg/dL — ABNORMAL HIGH (ref 70–99)
POTASSIUM: 4 meq/L (ref 3.7–5.3)
SODIUM: 137 meq/L (ref 137–147)
Total Protein: 6.8 g/dL (ref 6.0–8.3)

## 2014-08-14 MED ORDER — SODIUM CHLORIDE 0.9 % IV SOLN
3.6000 mg/kg | Freq: Once | INTRAVENOUS | Status: AC
  Administered 2014-08-14: 320 mg via INTRAVENOUS
  Filled 2014-08-14: qty 16

## 2014-08-14 MED ORDER — SODIUM CHLORIDE 0.9 % IV SOLN
Freq: Once | INTRAVENOUS | Status: AC
  Administered 2014-08-14: 11:00:00 via INTRAVENOUS

## 2014-08-14 MED ORDER — HEPARIN SOD (PORK) LOCK FLUSH 100 UNIT/ML IV SOLN
500.0000 [IU] | Freq: Once | INTRAVENOUS | Status: AC | PRN
  Administered 2014-08-14: 500 [IU]
  Filled 2014-08-14: qty 5

## 2014-08-14 MED ORDER — SODIUM CHLORIDE 0.9 % IJ SOLN: 10.0000 mL | INTRAMUSCULAR | Status: DC | PRN

## 2014-08-14 NOTE — Patient Instructions (Signed)
[facility] Discharge Instructions for Patients Receiving Chemotherapy  Today you received the following chemotherapy agents kadcyla  To help prevent nausea and vomiting after your treatment, we encourage you to take your nausea medication .   If you develop nausea and vomiting that is not controlled by your nausea medication, call the clinic. If it is after clinic hours your family physician or the after hours number for the clinic or go to the Emergency Department.   BELOW ARE SYMPTOMS THAT SHOULD BE REPORTED IMMEDIATELY:  *FEVER GREATER THAN 101.0 F  *CHILLS WITH OR WITHOUT FEVER  NAUSEA AND VOMITING THAT IS NOT CONTROLLED WITH YOUR NAUSEA MEDICATION  *UNUSUAL SHORTNESS OF BREATH  *UNUSUAL BRUISING OR BLEEDING  TENDERNESS IN MOUTH AND THROAT WITH OR WITHOUT PRESENCE OF ULCERS  *URINARY PROBLEMS  *BOWEL PROBLEMS  UNUSUAL RASH Items with * indicate a potential emergency and should be followed up as soon as possible.  One of the nurses will contact you 24 hours after your treatment. Please let the nurse know about any problems that you may have experienced. Feel free to call the clinic you have any questions or concerns. The clinic phone number is [phone].   I have been informed and understand all the instructions given to me. I know to contact the clinic, my physician, or go to the Emergency Department if any problems should occur. I do not have any questions at this time, but understand that I may call the clinic during office hours or the Patient Navigator at [phone] should I have any questions or need assistance in obtaining follow up care.   Ado-Trastuzumab Emtansine for injection What is this medicine? Ado-trastuzumab emtansine (ADD oh traz TOO zuh mab em TAN zine) is a monoclonal antibody combined with chemotherapy. It targets a protein called HER2. This protein is found in some stomach and breast cancers. This medicine can stop cancer cell  growth. This medicine may be used for other purposes; ask your health care provider or pharmacist if you have questions. COMMON BRAND NAME(S): Kadcyla What should I tell my health care provider before I take this medicine? They need to know if you have any of these conditions: -heart disease -heart failure -infection (especially a virus infection such as chickenpox, cold sores, or herpes) -liver disease -lung or breathing disease, like asthma -an unusual or allergic reaction to ado-trastuzumab emtansine, other medications, foods, dyes, or preservatives -pregnant or trying to get pregnant -breast-feeding How should I use this medicine? This medicine is for infusion into a vein. It is given by a health care professional in a hospital or clinic setting. Talk to your pediatrician regarding the use of this medicine in children. Special care may be needed. Overdosage: If you think you've taken too much of this medicine contact a poison control center or emergency room at once. Overdosage: If you think you have taken too much of this medicine contact a poison control center or emergency room at once. NOTE: This medicine is only for you. Do not share this medicine with others. What if I miss a dose? It is important not to miss your dose. Call your doctor or health care professional if you are unable to keep an appointment. What may interact with this medicine? This medicine may also interact with the following medications: -atazanavir -boceprevir -clarithromycin -delavirdine -indinavir -dalfopristin; quinupristin -isoniazid, INH -itraconazole -ketoconazole -nefazodone -nelfinavir -ritonavir -telaprevir -telithromycin -tipranavir -voriconazole This list may not describe all possible interactions. Give your health care provider  a list of all the medicines, herbs, non-prescription drugs, or dietary supplements you use. Also tell them if you smoke, drink alcohol, or use illegal drugs. Some  items may interact with your medicine. What should I watch for while using this medicine? Visit your doctor for checks on your progress. This drug may make you feel generally unwell. This is not uncommon, as chemotherapy can affect healthy cells as well as cancer cells. Report any side effects. Continue your course of treatment even though you feel ill unless your doctor tells you to stop. Call your doctor or health care professional for advice if you get a fever, chills or sore throat, or other symptoms of a cold or flu. Do not treat yourself. This drug decreases your body's ability to fight infections. Try to avoid being around people who are sick. Be careful brushing and flossing your teeth or using a toothpick because you may get an infection or bleed more easily. If you have any dental work done, tell your dentist you are receiving this medicine. Avoid taking products that contain aspirin, acetaminophen, ibuprofen, naproxen, or ketoprofen unless instructed by your doctor. These medicines may hide a fever. Do not become pregnant while taking this medicine. Women should inform their doctor if they wish to become pregnant or think they might be pregnant. There is a potential for serious side effects to an unborn child. [Men should inform their doctors if they wish to father a child. This medicine may lower sperm counts.] Talk to your health care professional or pharmacist for more information. Do not breast-feed an infant while taking this medicine. You may need blood work done while you are taking this medicine. What side effects may I notice from receiving this medicine? Side effects that you should report to your doctor or health care professional as soon as possible: -allergic reactions like skin rash, itching or hives, swelling of the face, lips, or tongue -breathing problems -chest pain or palpitations -fever or chills, sore throat -general ill feeling or flu-like symptoms -light-colored  stools -nausea, vomiting -pain, tingling, numbness in the hands or feet -signs and symptoms of bleeding such as bloody or black, tarry stools; red or dark-brown urine; spitting up blood or brown material that looks like coffee grounds; red spots on the skin; unusual bruising or bleeding from the eye, gums, or nose -swelling of the legs or ankles -yellowing of the eyes or skin Side effects that usually do not require medical attention (Report these to your doctor or health care professional if they continue or are bothersome.): -changes in taste -constipation -dizziness -headache -joint pain -muscle pain -trouble sleeping -unusually weak or tired This list may not describe all possible side effects. Call your doctor for medical advice about side effects. You may report side effects to FDA at 1-800-FDA-1088. Where should I keep my medicine? This drug is given in a hospital or clinic and will not be stored at home. NOTE: This sheet is a summary. It may not cover all possible information. If you have questions about this medicine, talk to your doctor, pharmacist, or health care provider.  2015, [employer] Standard. (2013-01-09 12:55:10)

## 2014-08-14 NOTE — Progress Notes (Signed)
Erika Nunez's reason for visit today is for labs as scheduled per MD orders.  Venipuncture performed with a 23 gauge butterfly needle to left hand.  Erika Hippo Speakman tolerated procedure well and without incident; questions were answered and patient was discharged.

## 2014-08-14 NOTE — Patient Instructions (Signed)
[place] Discharge Instructions  RECOMMENDATIONS MADE BY THE CONSULTANT AND ANY TEST RESULTS WILL BE SENT TO YOUR REFERRING PHYSICIAN.  Refills on pain medications provided today. Next treatment is due in 3 weeks. Return in 3 weeks for follow-up  Please call the [facility] with any questions or concerns.   Thank you for choosing [place] to provide your oncology and hematology care.  To afford each patient quality time with our providers, please arrive at least 15 minutes before your scheduled appointment time.  With your help, our goal is to use those 15 minutes to complete the necessary work-up to ensure our physicians have the information they need to help with your evaluation and healthcare recommendations.    Effective January 1st, 2014, we ask that you re-schedule your appointment with our physicians should you arrive 10 or more minutes late for your appointment.  We strive to give you quality time with our providers, and arriving late affects you and other patients whose appointments are after yours.    Again, thank you for choosing [facility].  Our hope is that these requests will decrease the amount of time that you wait before being seen by our physicians.       _____________________________________________________________  Should you have questions after your visit to [facility], please contact our office at [phone] between the hours of 8:30 a.m. and 5:00 p.m.  Voicemails left after 4:30 p.m. will not be returned until the following business day.  For prescription refill requests, have your pharmacy contact our office with your prescription refill request.

## 2014-08-26 ENCOUNTER — Other Ambulatory Visit (HOSPITAL_COMMUNITY): Payer: Self-pay | Admitting: Oncology

## 2014-08-26 DIAGNOSIS — C50911 Malignant neoplasm of unspecified site of right female breast: Secondary | ICD-10-CM

## 2014-08-26 DIAGNOSIS — C50919 Malignant neoplasm of unspecified site of unspecified female breast: Secondary | ICD-10-CM

## 2014-08-26 DIAGNOSIS — C799 Secondary malignant neoplasm of unspecified site: Secondary | ICD-10-CM

## 2014-08-26 DIAGNOSIS — C7931 Secondary malignant neoplasm of brain: Secondary | ICD-10-CM

## 2014-08-26 MED ORDER — OXYCODONE HCL 10 MG PO TABS: ORAL_TABLET | ORAL | Status: DC

## 2014-08-26 MED ORDER — FENTANYL 75 MCG/HR TD PT72: 75.0000 ug | MEDICATED_PATCH | TRANSDERMAL | Status: DC

## 2014-09-02 ENCOUNTER — Other Ambulatory Visit (HOSPITAL_COMMUNITY): Payer: Self-pay | Admitting: Oncology

## 2014-09-02 ENCOUNTER — Telehealth (HOSPITAL_COMMUNITY): Payer: Self-pay | Admitting: Oncology

## 2014-09-02 DIAGNOSIS — C50911 Malignant neoplasm of unspecified site of right female breast: Secondary | ICD-10-CM

## 2014-09-02 DIAGNOSIS — C7931 Secondary malignant neoplasm of brain: Secondary | ICD-10-CM

## 2014-09-02 DIAGNOSIS — C50919 Malignant neoplasm of unspecified site of unspecified female breast: Secondary | ICD-10-CM

## 2014-09-02 DIAGNOSIS — C799 Secondary malignant neoplasm of unspecified site: Secondary | ICD-10-CM

## 2014-09-02 MED ORDER — FENTANYL 75 MCG/HR TD PT72: 75.0000 ug | MEDICATED_PATCH | TRANSDERMAL | Status: DC

## 2014-09-02 MED ORDER — OXYCODONE HCL 10 MG PO TABS: ORAL_TABLET | ORAL | Status: DC

## 2014-09-02 NOTE — Telephone Encounter (Signed)
Done

## 2014-09-04 ENCOUNTER — Ambulatory Visit (HOSPITAL_COMMUNITY): Payer: Medicaid Other | Admitting: Hematology & Oncology

## 2014-09-04 ENCOUNTER — Inpatient Hospital Stay (HOSPITAL_COMMUNITY): Payer: Medicaid Other

## 2014-09-25 ENCOUNTER — Emergency Department (HOSPITAL_BASED_OUTPATIENT_CLINIC_OR_DEPARTMENT_OTHER): Payer: Self-pay

## 2014-09-25 ENCOUNTER — Encounter (HOSPITAL_COMMUNITY): Payer: Self-pay | Attending: Oncology | Admitting: Oncology

## 2014-09-25 ENCOUNTER — Inpatient Hospital Stay (HOSPITAL_COMMUNITY): Payer: Self-pay

## 2014-09-25 ENCOUNTER — Encounter (HOSPITAL_COMMUNITY): Payer: Self-pay | Attending: Hematology and Oncology

## 2014-09-25 ENCOUNTER — Inpatient Hospital Stay (HOSPITAL_COMMUNITY)
Admission: EM | Admit: 2014-09-25 | Discharge: 2014-10-03 | DRG: 682 | Disposition: A | Discharge status: Home / Self Care | Payer: Self-pay | Attending: Internal Medicine | Admitting: Internal Medicine

## 2014-09-25 ENCOUNTER — Other Ambulatory Visit: Payer: Self-pay

## 2014-09-25 ENCOUNTER — Encounter (HOSPITAL_COMMUNITY): Payer: Self-pay | Admitting: *Deleted

## 2014-09-25 ENCOUNTER — Encounter (HOSPITAL_COMMUNITY): Payer: Self-pay

## 2014-09-25 VITALS — BP 105/55 | HR 133 | Temp 97.7°F | Resp 24 | Wt 166.6 lb

## 2014-09-25 DIAGNOSIS — C50411 Malignant neoplasm of upper-outer quadrant of right female breast: Secondary | ICD-10-CM

## 2014-09-25 DIAGNOSIS — I129 Hypertensive chronic kidney disease with stage 1 through stage 4 chronic kidney disease, or unspecified chronic kidney disease: Secondary | ICD-10-CM | POA: Diagnosis present

## 2014-09-25 DIAGNOSIS — D0590 Unspecified type of carcinoma in situ of unspecified breast: Secondary | ICD-10-CM

## 2014-09-25 DIAGNOSIS — E860 Dehydration: Secondary | ICD-10-CM | POA: Diagnosis present

## 2014-09-25 DIAGNOSIS — C50919 Malignant neoplasm of unspecified site of unspecified female breast: Secondary | ICD-10-CM

## 2014-09-25 DIAGNOSIS — D61810 Antineoplastic chemotherapy induced pancytopenia: Secondary | ICD-10-CM

## 2014-09-25 DIAGNOSIS — F419 Anxiety disorder, unspecified: Secondary | ICD-10-CM | POA: Diagnosis present

## 2014-09-25 DIAGNOSIS — N183 Chronic kidney disease, stage 3 unspecified: Secondary | ICD-10-CM

## 2014-09-25 DIAGNOSIS — R5381 Other malaise: Secondary | ICD-10-CM

## 2014-09-25 DIAGNOSIS — C7931 Secondary malignant neoplasm of brain: Secondary | ICD-10-CM

## 2014-09-25 DIAGNOSIS — C7989 Secondary malignant neoplasm of other specified sites: Secondary | ICD-10-CM

## 2014-09-25 DIAGNOSIS — Z888 Allergy status to other drugs, medicaments and biological substances status: Secondary | ICD-10-CM

## 2014-09-25 DIAGNOSIS — Z9049 Acquired absence of other specified parts of digestive tract: Secondary | ICD-10-CM | POA: Diagnosis present

## 2014-09-25 DIAGNOSIS — R7881 Bacteremia: Secondary | ICD-10-CM

## 2014-09-25 DIAGNOSIS — C792 Secondary malignant neoplasm of skin: Secondary | ICD-10-CM | POA: Insufficient documentation

## 2014-09-25 DIAGNOSIS — G8929 Other chronic pain: Secondary | ICD-10-CM | POA: Diagnosis present

## 2014-09-25 DIAGNOSIS — L89153 Pressure ulcer of sacral region, stage 3: Secondary | ICD-10-CM

## 2014-09-25 DIAGNOSIS — D638 Anemia in other chronic diseases classified elsewhere: Secondary | ICD-10-CM | POA: Diagnosis present

## 2014-09-25 DIAGNOSIS — R627 Adult failure to thrive: Secondary | ICD-10-CM

## 2014-09-25 DIAGNOSIS — T451X5A Adverse effect of antineoplastic and immunosuppressive drugs, initial encounter: Secondary | ICD-10-CM

## 2014-09-25 DIAGNOSIS — D649 Anemia, unspecified: Secondary | ICD-10-CM

## 2014-09-25 DIAGNOSIS — E669 Obesity, unspecified: Secondary | ICD-10-CM | POA: Diagnosis present

## 2014-09-25 DIAGNOSIS — L89154 Pressure ulcer of sacral region, stage 4: Secondary | ICD-10-CM | POA: Diagnosis present

## 2014-09-25 DIAGNOSIS — I82629 Acute embolism and thrombosis of deep veins of unspecified upper extremity: Secondary | ICD-10-CM | POA: Diagnosis not present

## 2014-09-25 DIAGNOSIS — Z66 Do not resuscitate: Secondary | ICD-10-CM | POA: Diagnosis present

## 2014-09-25 DIAGNOSIS — M1990 Unspecified osteoarthritis, unspecified site: Secondary | ICD-10-CM | POA: Diagnosis present

## 2014-09-25 DIAGNOSIS — Z95828 Presence of other vascular implants and grafts: Secondary | ICD-10-CM

## 2014-09-25 DIAGNOSIS — C799 Secondary malignant neoplasm of unspecified site: Secondary | ICD-10-CM | POA: Insufficient documentation

## 2014-09-25 DIAGNOSIS — I10 Essential (primary) hypertension: Secondary | ICD-10-CM | POA: Diagnosis present

## 2014-09-25 DIAGNOSIS — F329 Major depressive disorder, single episode, unspecified: Secondary | ICD-10-CM | POA: Diagnosis present

## 2014-09-25 DIAGNOSIS — C50911 Malignant neoplasm of unspecified site of right female breast: Secondary | ICD-10-CM

## 2014-09-25 DIAGNOSIS — G629 Polyneuropathy, unspecified: Secondary | ICD-10-CM | POA: Diagnosis present

## 2014-09-25 DIAGNOSIS — E43 Unspecified severe protein-calorie malnutrition: Secondary | ICD-10-CM | POA: Diagnosis present

## 2014-09-25 DIAGNOSIS — Z9221 Personal history of antineoplastic chemotherapy: Secondary | ICD-10-CM

## 2014-09-25 DIAGNOSIS — D72829 Elevated white blood cell count, unspecified: Secondary | ICD-10-CM | POA: Diagnosis present

## 2014-09-25 DIAGNOSIS — C50912 Malignant neoplasm of unspecified site of left female breast: Secondary | ICD-10-CM | POA: Diagnosis present

## 2014-09-25 DIAGNOSIS — L98429 Non-pressure chronic ulcer of back with unspecified severity: Secondary | ICD-10-CM

## 2014-09-25 DIAGNOSIS — L8994 Pressure ulcer of unspecified site, stage 4: Secondary | ICD-10-CM

## 2014-09-25 DIAGNOSIS — N19 Unspecified kidney failure: Secondary | ICD-10-CM

## 2014-09-25 DIAGNOSIS — Z9889 Other specified postprocedural states: Secondary | ICD-10-CM

## 2014-09-25 DIAGNOSIS — I82290 Acute embolism and thrombosis of other thoracic veins: Secondary | ICD-10-CM | POA: Diagnosis not present

## 2014-09-25 DIAGNOSIS — E780 Pure hypercholesterolemia: Secondary | ICD-10-CM | POA: Diagnosis present

## 2014-09-25 DIAGNOSIS — M869 Osteomyelitis, unspecified: Secondary | ICD-10-CM | POA: Diagnosis present

## 2014-09-25 DIAGNOSIS — E785 Hyperlipidemia, unspecified: Secondary | ICD-10-CM | POA: Diagnosis present

## 2014-09-25 DIAGNOSIS — Z85841 Personal history of malignant neoplasm of brain: Secondary | ICD-10-CM

## 2014-09-25 DIAGNOSIS — IMO0001 Reserved for inherently not codable concepts without codable children: Secondary | ICD-10-CM

## 2014-09-25 DIAGNOSIS — Z6825 Body mass index (BMI) 25.0-25.9, adult: Secondary | ICD-10-CM

## 2014-09-25 DIAGNOSIS — I82622 Acute embolism and thrombosis of deep veins of left upper extremity: Secondary | ICD-10-CM | POA: Diagnosis not present

## 2014-09-25 DIAGNOSIS — Z886 Allergy status to analgesic agent status: Secondary | ICD-10-CM

## 2014-09-25 DIAGNOSIS — K5900 Constipation, unspecified: Secondary | ICD-10-CM | POA: Diagnosis present

## 2014-09-25 DIAGNOSIS — I5032 Chronic diastolic (congestive) heart failure: Secondary | ICD-10-CM | POA: Diagnosis present

## 2014-09-25 DIAGNOSIS — N179 Acute kidney failure, unspecified: Principal | ICD-10-CM

## 2014-09-25 DIAGNOSIS — Z7401 Bed confinement status: Secondary | ICD-10-CM

## 2014-09-25 LAB — BASIC METABOLIC PANEL
Anion gap: 9 (ref 5–15)
BUN: 44 mg/dL — ABNORMAL HIGH (ref 6–23)
CHLORIDE: 104 meq/L (ref 96–112)
CO2: 22 mmol/L (ref 19–32)
Calcium: 11.5 mg/dL — ABNORMAL HIGH (ref 8.4–10.5)
Creatinine, Ser: 3.03 mg/dL — ABNORMAL HIGH (ref 0.50–1.10)
GFR calc non Af Amer: 15 mL/min — ABNORMAL LOW (ref >90)
GFR, EST AFRICAN AMERICAN: 18 mL/min — AB (ref >90)
GLUCOSE: 86 mg/dL (ref 70–99)
Potassium: 3.4 mmol/L — ABNORMAL LOW (ref 3.5–5.1)
Sodium: 135 mmol/L (ref 135–145)

## 2014-09-25 LAB — CBC WITH DIFFERENTIAL/PLATELET
Basophils Absolute: 0 10*3/uL (ref 0.0–0.1)
Basophils Relative: 0 % (ref 0–1)
EOS PCT: 0 % (ref 0–5)
Eosinophils Absolute: 0.1 10*3/uL (ref 0.0–0.7)
HEMATOCRIT: 28 % — AB (ref 36.0–46.0)
HEMOGLOBIN: 9.3 g/dL — AB (ref 12.0–15.0)
LYMPHS ABS: 0.7 10*3/uL (ref 0.7–4.0)
LYMPHS PCT: 6 % — AB (ref 12–46)
MCH: 30 pg (ref 26.0–34.0)
MCHC: 33.2 g/dL (ref 30.0–36.0)
MCV: 90.3 fL (ref 78.0–100.0)
MONO ABS: 0.8 10*3/uL (ref 0.1–1.0)
Monocytes Relative: 7 % (ref 3–12)
NEUTROS ABS: 10.3 10*3/uL — AB (ref 1.7–7.7)
Neutrophils Relative %: 87 % — ABNORMAL HIGH (ref 43–77)
PLATELETS: 391 10*3/uL (ref 150–400)
RBC: 3.1 MIL/uL — ABNORMAL LOW (ref 3.87–5.11)
RDW: 14.9 % (ref 11.5–15.5)
WBC: 11.8 10*3/uL — AB (ref 4.0–10.5)

## 2014-09-25 LAB — COMPREHENSIVE METABOLIC PANEL
ALBUMIN: 3.4 g/dL — AB (ref 3.5–5.2)
ALT: 30 U/L (ref 0–35)
AST: 34 U/L (ref 0–37)
Alkaline Phosphatase: 119 U/L — ABNORMAL HIGH (ref 39–117)
Anion gap: 14 (ref 5–15)
BILIRUBIN TOTAL: 0.6 mg/dL (ref 0.3–1.2)
BUN: 47 mg/dL — AB (ref 6–23)
CHLORIDE: 104 meq/L (ref 96–112)
CO2: 18 mmol/L — ABNORMAL LOW (ref 19–32)
CREATININE: 3.43 mg/dL — AB (ref 0.50–1.10)
Calcium: 12.3 mg/dL — ABNORMAL HIGH (ref 8.4–10.5)
GFR calc Af Amer: 15 mL/min — ABNORMAL LOW (ref >90)
GFR calc non Af Amer: 13 mL/min — ABNORMAL LOW (ref >90)
Glucose, Bld: 168 mg/dL — ABNORMAL HIGH (ref 70–99)
Potassium: 3.6 mmol/L (ref 3.5–5.1)
Sodium: 136 mmol/L (ref 135–145)
TOTAL PROTEIN: 7.3 g/dL (ref 6.0–8.3)

## 2014-09-25 MED ORDER — SODIUM CHLORIDE 0.9 % IV SOLN
INTRAVENOUS | Status: AC
  Administered 2014-09-25: 17:00:00 via INTRAVENOUS

## 2014-09-25 MED ORDER — PRAVASTATIN SODIUM 40 MG PO TABS
80.0000 mg | ORAL_TABLET | Freq: Every day | ORAL | Status: DC
  Administered 2014-09-26 – 2014-10-02 (×7): 80 mg via ORAL
  Filled 2014-09-25 (×7): qty 2

## 2014-09-25 MED ORDER — SODIUM CHLORIDE 0.9 % IV BOLUS (SEPSIS)
1000.0000 mL | Freq: Once | INTRAVENOUS | Status: AC
  Administered 2014-09-25: 1000 mL via INTRAVENOUS

## 2014-09-25 MED ORDER — OXYCODONE HCL 5 MG PO TABS
5.0000 mg | ORAL_TABLET | Freq: Three times a day (TID) | ORAL | Status: DC | PRN
  Administered 2014-09-25 – 2014-09-26 (×2): 10 mg via ORAL
  Filled 2014-09-25 (×2): qty 2

## 2014-09-25 MED ORDER — ACETAMINOPHEN 325 MG PO TABS: 650.0000 mg | ORAL_TABLET | Freq: Four times a day (QID) | ORAL | Status: DC | PRN

## 2014-09-25 MED ORDER — ENSURE PUDDING PO PUDG
1.0000 | Freq: Three times a day (TID) | ORAL | Status: DC
Start: 2014-09-25 — End: 2014-09-26
  Administered 2014-09-25 – 2014-09-26 (×2): 1 via ORAL

## 2014-09-25 MED ORDER — FENTANYL 75 MCG/HR TD PT72
75.0000 ug | MEDICATED_PATCH | TRANSDERMAL | Status: DC
  Administered 2014-09-25 – 2014-10-01 (×3): 75 ug via TRANSDERMAL
  Filled 2014-09-25 (×3): qty 1

## 2014-09-25 MED ORDER — LORAZEPAM 1 MG PO TABS
1.0000 mg | ORAL_TABLET | ORAL | Status: DC | PRN
  Administered 2014-09-25 – 2014-10-03 (×7): 1 mg via ORAL
  Filled 2014-09-25 (×7): qty 1

## 2014-09-25 MED ORDER — SODIUM CHLORIDE 0.9 % IV SOLN: INTRAVENOUS | Status: DC

## 2014-09-25 MED ORDER — VANCOMYCIN HCL IN DEXTROSE 1-5 GM/200ML-% IV SOLN
1000.0000 mg | INTRAVENOUS | Status: DC
  Administered 2014-09-27: 1000 mg via INTRAVENOUS
  Filled 2014-09-25 (×2): qty 200

## 2014-09-25 MED ORDER — SODIUM CHLORIDE 0.9 % IJ SOLN
3.0000 mL | Freq: Two times a day (BID) | INTRAMUSCULAR | Status: DC
  Administered 2014-09-25 – 2014-10-03 (×11): 3 mL via INTRAVENOUS

## 2014-09-25 MED ORDER — CEFEPIME HCL 1 G IJ SOLR
1.0000 g | INTRAMUSCULAR | Status: DC
  Administered 2014-09-26 – 2014-09-27 (×2): 1 g via INTRAVENOUS
  Filled 2014-09-25 (×4): qty 1

## 2014-09-25 MED ORDER — HEPARIN SOD (PORK) LOCK FLUSH 100 UNIT/ML IV SOLN
INTRAVENOUS | Status: AC
  Filled 2014-09-25: qty 5

## 2014-09-25 MED ORDER — HYDROCHLOROTHIAZIDE 25 MG PO TABS
25.0000 mg | ORAL_TABLET | Freq: Every day | ORAL | Status: DC
Start: 2014-09-26 — End: 2014-09-26
  Administered 2014-09-26: 25 mg via ORAL
  Filled 2014-09-25: qty 1

## 2014-09-25 MED ORDER — FENTANYL CITRATE 0.05 MG/ML IJ SOLN
50.0000 ug | Freq: Once | INTRAMUSCULAR | Status: AC
Start: 2014-09-25 — End: 2014-09-25
  Administered 2014-09-25: 50 ug via INTRAVENOUS
  Filled 2014-09-25: qty 2

## 2014-09-25 MED ORDER — HEPARIN SODIUM (PORCINE) 5000 UNIT/ML IJ SOLN
5000.0000 [IU] | Freq: Three times a day (TID) | INTRAMUSCULAR | Status: DC
  Administered 2014-09-25 – 2014-10-01 (×18): 5000 [IU] via SUBCUTANEOUS
  Filled 2014-09-25 (×17): qty 1

## 2014-09-25 MED ORDER — HEPARIN SOD (PORK) LOCK FLUSH 100 UNIT/ML IV SOLN: 500.0000 [IU] | Freq: Once | INTRAVENOUS | Status: DC | PRN

## 2014-09-25 MED ORDER — SENNA 8.6 MG PO TABS
1.0000 | ORAL_TABLET | Freq: Two times a day (BID) | ORAL | Status: DC
  Administered 2014-09-25 – 2014-10-03 (×16): 8.6 mg via ORAL
  Filled 2014-09-25 (×16): qty 1

## 2014-09-25 MED ORDER — VANCOMYCIN HCL 10 G IV SOLR
1500.0000 mg | Freq: Once | INTRAVENOUS | Status: AC
  Administered 2014-09-25: 1500 mg via INTRAVENOUS
  Filled 2014-09-25: qty 1500

## 2014-09-25 MED ORDER — ONDANSETRON HCL 4 MG PO TABS
4.0000 mg | ORAL_TABLET | Freq: Four times a day (QID) | ORAL | Status: DC | PRN
  Administered 2014-09-26 – 2014-10-02 (×2): 4 mg via ORAL
  Filled 2014-09-25 (×2): qty 1

## 2014-09-25 MED ORDER — GABAPENTIN 300 MG PO CAPS
300.0000 mg | ORAL_CAPSULE | Freq: Two times a day (BID) | ORAL | Status: DC
  Administered 2014-09-25 – 2014-10-03 (×16): 300 mg via ORAL
  Filled 2014-09-25 (×16): qty 1

## 2014-09-25 MED ORDER — DEXTROSE 5 % IV SOLN
2.0000 g | Freq: Once | INTRAVENOUS | Status: AC
  Administered 2014-09-25: 2 g via INTRAVENOUS
  Filled 2014-09-25: qty 2

## 2014-09-25 MED ORDER — SODIUM CHLORIDE 0.9 % IJ SOLN: 10.0000 mL | INTRAMUSCULAR | Status: DC | PRN

## 2014-09-25 MED ORDER — POTASSIUM CHLORIDE CRYS ER 20 MEQ PO TBCR
40.0000 meq | EXTENDED_RELEASE_TABLET | Freq: Three times a day (TID) | ORAL | Status: DC
  Administered 2014-09-25 – 2014-10-03 (×23): 40 meq via ORAL
  Filled 2014-09-25 (×23): qty 2

## 2014-09-25 MED ORDER — ACETAMINOPHEN 650 MG RE SUPP: 650.0000 mg | Freq: Four times a day (QID) | RECTAL | Status: DC | PRN

## 2014-09-25 MED ORDER — ONDANSETRON HCL 4 MG/2ML IJ SOLN
4.0000 mg | Freq: Four times a day (QID) | INTRAMUSCULAR | Status: DC | PRN
  Administered 2014-10-01: 4 mg via INTRAVENOUS
  Filled 2014-09-25: qty 2

## 2014-09-25 MED ORDER — SODIUM CHLORIDE 0.9 % IV SOLN
Freq: Once | INTRAVENOUS | Status: AC
  Administered 2014-09-25: 10:00:00 via INTRAVENOUS

## 2014-09-25 NOTE — Progress Notes (Signed)
ANTIBIOTIC CONSULT NOTE - INITIAL  Pharmacy Consult for Vancomycin and Cefepime Indication: suspected osteomyelitis  Allergies  Allergen Reactions  . Codeine Hives  . Famvir [Famciclovir]     Patient had edema from knees down while taking. When she stopped taking drug, the edema went away.   Patient Measurements: Height: 5\' 7"  (170.2 cm) Weight: 166 lb (75.297 kg) IBW/kg (Calculated) : 61.6  Vital Signs: Temp: 98.2 F (36.8 C) (01/13 1221) Temp Source: Oral (01/13 1221) BP: 109/61 mmHg (01/13 1600) Pulse Rate: 96 (01/13 1600) Intake/Output from previous day:   Intake/Output from this shift:    Labs:  Recent Labs  09/25/14 1016  WBC 11.8*  HGB 9.3*  PLT 391  CREATININE 3.43*   Estimated Creatinine Clearance: 17.6 mL/min (by C-G formula based on Cr of 3.43). No results for input(s): VANCOTROUGH, VANCOPEAK, VANCORANDOM, GENTTROUGH, GENTPEAK, GENTRANDOM, TOBRATROUGH, TOBRAPEAK, TOBRARND, AMIKACINPEAK, AMIKACINTROU, AMIKACIN in the last 72 hours.   Microbiology: Recent Results (from the past 720 hour(s))  Culture, blood (routine x 2)     Status: None (Preliminary result)   Collection Time: 09/25/14 10:05 AM  Result Value Ref Range Status   Specimen Description Blood  Final   Special Requests NONE  Final   Culture NO GROWTH <24 HRS  Final   Report Status PENDING  Incomplete  Culture, blood (routine x 2)     Status: None (Preliminary result)   Collection Time: 09/25/14 10:10 AM  Result Value Ref Range Status   Specimen Description Blood  Final   Special Requests NONE  Final   Culture NO GROWTH <24 HRS  Final   Report Status PENDING  Incomplete   Medical History: Past Medical History  Diagnosis Date  . Hypertension   . High cholesterol   . Cancer   . Breast cancer   . Allergy     seasonal allergies  . Arthritis   . Anxiety   . Breast mass, left 2014  . Breast mass, right 2014  . 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 Depression    . Lymphedema of arm     right arm   Anti-infectives    Start     Dose/Rate Route Frequency Ordered Stop   09/27/14 0600  vancomycin (VANCOCIN) IVPB 1000 mg/200 mL premix     1,000 mg200 mL/hr over 60 Minutes Intravenous Every 48 hours 09/25/14 1652     09/26/14 1800  ceFEPIme (MAXIPIME) 1 g in dextrose 5 % 50 mL IVPB     1 g100 mL/hr over 30 Minutes Intravenous Every 24 hours 09/25/14 1653     09/25/14 1730  vancomycin (VANCOCIN) 1,500 mg in sodium chloride 0.9 % 500 mL IVPB     1,500 mg250 mL/hr over 120 Minutes Intravenous  Once 09/25/14 1641     09/25/14 1645  ceFEPIme (MAXIPIME) 2 g in dextrose 5 % 50 mL IVPB     2 g100 mL/hr over 30 Minutes Intravenous  Once 09/25/14 1639       Assessment: 64yo female with progressive weakness and h/o sacral pain and discharge.  Currently undergoing chemo for breast cancer.  SCr elevated on admission.  Asked to initiate Vancomycin and Cefepime for possible osteo.  Estimated ClCr 15-34ml/min.  Goal of Therapy:  Vancomycin trough level 15-20 mcg/ml Eradicate infection.  Plan:  Cefepime 2gm IV today x 1 then 1gm IV q24hrs Vancomycin 1500mg  IV today x 1 then 1gm IV q48hrs Check trough at steady state Monitor labs, renal fxn, and cultures  Hart Robinsons A 09/25/2014,4:55 PM

## 2014-09-25 NOTE — ED Notes (Signed)
Pt comes from [facility]. Labs were drawn on patient this morning which showed elevated liver enzymes. Pt was sent here by doctor. Also, pt has ulcer to her bottom. NAD noted at this time.

## 2014-09-25 NOTE — Progress Notes (Signed)
The patient is seen as a work-in today.  She is scheduled for chemotherapy, but the nurse notes that she does not look well.   I saw the patient in a chemotherapy recliner.  She is pale.  She looks ill.  She denies any cough, sputum production, fevers, chills, urinary complaints and bowel complaints.  She denies any nausea or vomiting.  She is noted to be shaking in the room, but I admit that the room was a little chilly.  Chemotherapy was held and labs were drawn.  I returned to the patient with lab resuls in hand.  When I received results, her metabolic panel was impressive for renal failure and hypercalcemia (possibly from dehydration).  Due to this, chemotherapy was aborted today. She was moved to a chemo-bed in the interim time.    After reviewing labs with the patient, I recommended hospitalization.  She is agreeable to this, hesitantly.  Her daughter also notes that the patient has a decubitus ulcer.  On exam, she has a Stage III decubitus ulcer at coccyx.  This will need addressing as well.   Exam: Gen: Not looking well.  Pale. HEENT: Unimpressive Cardiac: RRR Lungs: CTA B/L Skin: Pale. Chest wall: Right sided malignancy involvement.  Stable in appearance. Buttocks: midline open wound with fat layer exposed measuring 2 cm whitish drainage. Neuro: A and O x 3.  Assessment: 1. Renal failure 2. Hypercalcemia, mild, likely dehydration induced 3. Normocytic, normochromic anemia 4. Failure to thrive x 1 week 5. Decubitus ulcer, Stage III 6. Stage IV [place] breast cancer.  Plan: 1. Labs today: CBC diff, CMET, blood cultures 2. Needs hospitalization for acute renal failure.  Recommend wound care consult for decubitus ulcer.  Recommend oncology consult as an inpatient to discuss goals of care.  Patient and plan discussed with Dr. Ancil Linsey and she is in agreement with the aforementioned.

## 2014-09-25 NOTE — Progress Notes (Signed)
Labs for bcultX2 ,cbcd cmp

## 2014-09-25 NOTE — ED Notes (Signed)
x1 attempt made to call the floor, told they would call back.

## 2014-09-25 NOTE — ED Notes (Signed)
MD at bedside. 

## 2014-09-25 NOTE — H&P (Signed)
[facility] History and Physical  Erika Nunez OXB:[id number] DOB: [birth year] DOA: 09/25/2014  Referring physician: Dr. Leonides Schanz - [facility] PCP: [name], MD   Chief Complaint: "feeling sick"  HPI: Erika Nunez is a 64 y.o. female  Pt reports feeling progressive generalized weakness over the past 2 weeks. Denies fevers, CP, SOB, syncope, dysuria, frequency. Becomes winded w/ exertion. 1 week h/o sacral pain and discharge. H2O2 and duoderm placement w/o benefit.  Very little fluid intake during this time. Pt is currently undergoing chemotherapy for bilateral breast cancer (s/p double mastectomy). Last chemo on 08/14/14. Pt presented to oncology outpt dept today w/ symptoms and was sent to the emergency room due to reported renal failure, weakness and a sacral ulcer.    Review of Systems:  Constitutional:  fatigue. No fevers, chills HEENT:  No headaches, Difficulty swallowing,Tooth/dental problems,Sore throat,  No sneezing, itching, ear ache, nasal congestion, post nasal drip,  Cardio-vascular:  No chest pain, Orthopnea, PND, swelling in lower extremities, anasarca, dizziness, palpitations  GI:  No heartburn, indigestion, abdominal pain, nausea, vomiting, diarrhea,  Resp:  No  No excess mucus, no productive cough, No non-productive cough, No coughing up of blood.No change in color of mucus.No wheezing.No chest wall deformity  Skin:  Per hpi GU:  no dysuria, change in color of urine, no urgency or frequency. No flank pain.  Musculoskeletal:  No joint pain or swelling. No decreased range of motion. No back pain.  Psych:  No change in mood or affect. No depression or anxiety. No memory loss.   Past Medical History  Diagnosis Date  . Hypertension   . High cholesterol   . Cancer   . Breast cancer   . Allergy     seasonal allergies  . Arthritis   . Anxiety   . Breast mass, left 2014  . Breast mass, right 2014  . Antineoplastic chemotherapy induced pancytopenia  01/04/2013  . Depression   . Lymphedema of arm     right arm   Past Surgical History  Procedure Laterality Date  . Cholecystectomy    . Tubal ligation    . Tonsillectomy    . Breast biopsy  10/02/2012    Procedure: BREAST BIOPSY;  Surgeon: Donato Heinz, MD;  Location: [facility];  Service: General;  Laterality: Right;  . Portacath placement Left 10/20/2012  . Breast biopsy Left 10/20/2012    Procedure: BREAST BIOPSY;  Surgeon: Donato Heinz, MD;  Location: [facility];  Service: General;  Laterality: Left;  Procedure end 1142  . Portacath placement Left 10/20/2012    Procedure: INSERTION PORT-A-CATH;  Surgeon: Donato Heinz, MD;  Location: [facility];  Service: General;  Laterality: Left;  Procedure began 6834; left subclavian  . Simple mastectomy with axillary sentinel node biopsy Bilateral 04/25/2013    Procedure: SIMPLE MASTECTOMY;  Surgeon: Donato Heinz, MD;  Location: [facility];  Service: General;  Laterality: Bilateral;  . Craniotomy Right 02/26/2014    Procedure: suboccipital craniotomy for tumor resection;  Surgeon: Consuella Lose, MD;  Location: [place] [facility];  Service: Neurosurgery;  Laterality: Right;  suboccipital craniotomy for tumor resection  . Skin biopsy Right 04/15/2014    right lateral chest   Social History:  reports that she has never smoked. She has never used smokeless tobacco. She reports that she does not drink alcohol or use illicit drugs.  Allergies  Allergen Reactions  . Codeine Hives  . Famvir [Famciclovir]     Patient had edema  from knees down while taking. When she stopped taking drug, the edema went away.    Family History  Problem Relation Age of Onset  . Heart attack Father   . Cancer Maternal Aunt     colon cancer  . Stroke Maternal Uncle   . Cancer Maternal Grandmother     colon cancer  . Cancer Maternal Aunt     lung cancer  . COPD Daughter      Prior to Admission medications   Medication Sig Start Date End Date Taking? Authorizing Provider    fentaNYL (DURAGESIC - DOSED MCG/HR) 75 MCG/HR Place 1 patch (75 mcg total) onto the skin every 3 (three) days. 09/02/14  Yes Manon Hilding Kefalas, PA-C  gabapentin (NEURONTIN) 300 MG capsule Take 1 capsule (300 mg total) by mouth 2 (two) times daily. 06/12/14  Yes Manon Hilding Kefalas, PA-C  hydrochlorothiazide (HYDRODIURIL) 25 MG tablet Take 25 mg by mouth daily.   Yes Historical Provider, MD  ibuprofen (ADVIL,MOTRIN) 200 MG tablet Take 200 mg by mouth every 6 (six) hours as needed. Pain.   Yes Historical Provider, MD  lidocaine-prilocaine (EMLA) cream Apply topically once. Apply a quarter sized amount to port site 1 hour prior to chemo. Do not rub in. Cover with plastic.   Yes Historical Provider, MD  lisinopril (PRINIVIL,ZESTRIL) 20 MG tablet Take 20 mg by mouth daily.   Yes Historical Provider, MD  LORazepam (ATIVAN) 1 MG tablet Take 1 tablet (1 mg total) by mouth every 4 (four) hours as needed (nausea/vomiting). 05/21/14  Yes Manon Hilding Kefalas, PA-C  magnesium oxide (MAG-OX) 400 MG tablet Take 400 mg by mouth 3 (three) times daily.   Yes Historical Provider, MD  ondansetron (ZOFRAN) 8 MG tablet Take 8 mg by mouth 2 (two) times daily as needed for nausea.    Yes Historical Provider, MD  Oxycodone HCl 10 MG TABS Take 1 or 2 tablets every 2-4 hours to control pain 09/02/14  Yes Manon Hilding Kefalas, PA-C  potassium chloride SA (K-DUR,KLOR-CON) 20 MEQ tablet Take 2 tablets (40 mEq total) by mouth 3 (three) times daily. 05/09/14  Yes Farrel Gobble, MD  pravastatin (PRAVACHOL) 80 MG tablet Take 80 mg by mouth at bedtime.   Yes Historical Provider, MD  sennosides-docusate sodium (SENOKOT-S) 8.6-50 MG tablet Take 2 tablets by mouth daily.   Yes Historical Provider, MD  Ado-Trastuzumab Emtansine (KADCYLA IV) Inject into the vein every 21 ( twenty-one) days. To start 05/01/14 05/01/14   Historical Provider, MD   Physical Exam: Filed Vitals:   09/25/14 1221 09/25/14 1500 09/25/14 1530 09/25/14 1600  BP: 101/58 90/54  94/60 109/61  Pulse: 107 85 77 96  Temp: 98.2 F (36.8 C)     TempSrc: Oral     Resp: 20 20 18 18   Height: 5\' 7"  (1.702 m)     Weight: 75.297 kg (166 lb)     SpO2: 94% 95% 94% 100%    Wt Readings from Last 3 Encounters:  09/25/14 75.297 kg (166 lb)  09/25/14 75.569 kg (166 lb 9.6 oz)  08/14/14 84.732 kg (186 lb 12.8 oz)    General: appears ill  Eyes:  PERRL, normal lids, irises & conjunctiva ENT: dry mucus membranes Neck:  no LAD, masses, FROM Cardiovascular:  RRR, no m/r/g. No LE edema. Telemetry:  SR, no arrhythmias  Respiratory:  CTA bilaterally, no w/r/r. Normal respiratory effort. Abdomen:  soft, ntnd Skin: quarter sized sacral ulcer w/ tracking about 1 inch deep, possibly to the  bone, purulent discharge present . Granulation tissue present along the tract Musculoskeletal:  grossly normal tone BUE/BLE Psychiatric:  grossly normal mood and affect, speech fluent and appropriate Neurologic:  grossly non-focal.          Labs on Admission:  Basic Metabolic Panel:  Recent Labs Lab 09/25/14 1016  NA 136  K 3.6  CL 104  CO2 18*  GLUCOSE 168*  BUN 47*  CREATININE 3.43*  CALCIUM 12.3*   Liver Function Tests:  Recent Labs Lab 09/25/14 1016  AST 34  ALT 30  ALKPHOS 119*  BILITOT 0.6  PROT 7.3  ALBUMIN 3.4*   No results for input(s): LIPASE, AMYLASE in the last 168 hours. No results for input(s): AMMONIA in the last 168 hours. CBC:  Recent Labs Lab 09/25/14 1016  WBC 11.8*  NEUTROABS 10.3*  HGB 9.3*  HCT 28.0*  MCV 90.3  PLT 391   Cardiac Enzymes: No results for input(s): CKTOTAL, CKMB, CKMBINDEX, TROPONINI in the last 168 hours.  BNP (last 3 results) No results for input(s): PROBNP in the last 8760 hours. CBG: No results for input(s): GLUCAP in the last 168 hours.  Radiological Exams on Admission: No results found.  EKG: Independently reviewed. NSR, no sign of ACS  Assessment/Plan Principal Problem:   Acute renal failure superimposed on  stage 3 chronic kidney disease Active Problems:   Breast cancer   Hypertension   Metastasis from breast cancer   Hypercalcemia   Physical deconditioning   Stage IV pressure ulcer of sacral region   Chronic diastolic CHF (congestive heart failure)   Constipation   HLD (hyperlipidemia)   Acute on Chronic Kidney Disease: BUN 47, Cr 3.43. Baseline 1.25. Likely multifactorial from chemo and dehydration.  2L NS bolus in ED - Admit - IVF NS 19ml/hr - BMET at 22:00 and in am - hold home lisinopril until renal function improves.   Stage IV Sacral Ulceration: Family reports this only being present for 1 week but anticipate much longer progression. Concern for osteo. WBC 11.3 (immune compromised due to chemo) - MRI (discussed w/ radiology and due to renal failure and concerns for osteo, recomending MR instead of CT w/o contrast).  - start empiric ABX - Vanc/Cefepime - Wound Cx sent - f/u [place] - wound care team to help with packing.  - Surgery or ortho consult after CT (May need debridement) - Wet to dry dressing changes  Generalized weakness: likely multifactorial - Chemo, infectious, ARF, physical deconditioning, inadequate nutrition - Treatment plans as indicated for other problems - Nutrition consult - PT / OT - ensure TID between meals  Hypercalcemia: 12.3. Mild/moderate. Likely from chemo and acute renal failure.  - IVF as above  - trend BMETs  Hypertension: hypotensive on presentation. Likely from dehydration and continued BP meds - hold home HCTZ and lisinopril until pressure and renal function improves.   Chronic pain / Neuropathy:  - continue oxycodone, fentanyl, and neurontin  Chronic diastolic heart failure: Echo 10/2013 w/ EF 60-65% and Grade I diastolic dysfunction. No sign of overload - monitor closely given IVF - continue K   Constipation: chronic condition for pt - Senna and MIralax  HLD:  - continue pravastatin  Stage IV metastatic breast cancer: pt is s/p  double mastectomy but w/ significant metastatic disease. Last Chemo 08/14/14. - Oncology aware and will follow (per their request for goals of care discussion)   Anxiety: - Continue home Ativan  Code Status: full DVT Prophylaxis: Hep Family Communication: Daughter Disposition  Plan: pending clinical improvement   MERRELL, Adel [facility] [email or web address] Password [email or web address]

## 2014-09-25 NOTE — Progress Notes (Signed)
Pt taken via wheelchair down to ER by Jeannette How RN, Reprt called to ER by Caroleen Hamman PA, Pt left accessed via port cath but no running fluids.  Pt alert and oriented at time.

## 2014-09-25 NOTE — ED Provider Notes (Signed)
This chart was scribed for [place], DO by Edison Simon, ED Scribe. This patient was seen in room [facility]   TIME SEEN: 1:31 PM  CHIEF COMPLAINT: elevated liver enzymes   HPI: Erika Nunez is a 64 y.o. female with history of metastatic breast cancer on chemotherapy, hypertension, hyperlipidemia who presents to the Emergency Department complaining of generalized weakness for the past several days. States that she has not been eating and drinking well. He was seen by her oncologist PA today and was found to have an elevated creatinine and calcium level. Was sent to come to the emergency department for admission. She states she did have shortness of breath yesterday but has none today. No chest pain. No vomiting or diarrhea.  Denies numbness, tingling or focal weakness. Last chemotherapy was the beginning of December.  PCP: [facility]   ROS: See HPI Constitutional: no fever, positive decreased intake  Eyes: no drainage  ENT: no runny nose   Cardiovascular:  no chest pain  Resp: positive SOB  GI: no vomiting, vomiting, diarrhea GU: no dysuria Integumentary: no rash  Allergy: no hives  Musculoskeletal: no leg swelling  Neurological: no slurred speech, positive generalized weakness ROS otherwise negative  PAST MEDICAL HISTORY/PAST SURGICAL HISTORY:  Past Medical History  Diagnosis Date  . Hypertension   . High cholesterol   . Cancer   . Breast cancer   . Allergy     seasonal allergies  . Arthritis   . Anxiety   . Breast mass, left 2014  . Breast mass, right 2014  . 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 Depression   . Lymphedema of arm     right arm    MEDICATIONS:  Prior to Admission medications   Medication Sig Start Date End Date Taking? Authorizing Provider  Ado-Trastuzumab Emtansine (KADCYLA IV) Inject into the vein every 21 ( twenty-one) days. To start 05/01/14 05/01/14   Historical Provider, MD  fentaNYL (DURAGESIC -  DOSED MCG/HR) 75 MCG/HR Place 1 patch (75 mcg total) onto the skin every 3 (three) days. 09/02/14   Baird Cancer, PA-C  gabapentin (NEURONTIN) 300 MG capsule Take 1 capsule (300 mg total) by mouth 2 (two) times daily. 06/12/14   Baird Cancer, PA-C  ibuprofen (ADVIL,MOTRIN) 200 MG tablet Take 200 mg by mouth every 6 (six) hours as needed. Pain.    Historical Provider, MD  lidocaine-prilocaine (EMLA) cream Apply topically once. Apply a quarter sized amount to port site 1 hour prior to chemo. Do not rub in. Cover with plastic.    Historical Provider, MD  lisinopril (PRINIVIL,ZESTRIL) 20 MG tablet Take 20 mg by mouth daily.    Historical Provider, MD  lisinopril-hydrochlorothiazide (PRINZIDE,ZESTORETIC) 20-25 MG per tablet Take 1 tablet by mouth daily.    Historical Provider, MD  LORazepam (ATIVAN) 1 MG tablet Take 1 tablet (1 mg total) by mouth every 4 (four) hours as needed (nausea/vomiting). 05/21/14   Baird Cancer, PA-C  magnesium oxide (MAG-OX) 400 MG tablet Take 400 mg by mouth 3 (three) times daily.    Historical Provider, MD  ondansetron (ZOFRAN) 8 MG tablet Take 8 mg by mouth 2 (two) times daily as needed for nausea.     Historical Provider, MD  Oxycodone HCl 10 MG TABS Take 1 or 2 tablets every 2-4 hours to control pain 09/02/14   Manon Hilding Kefalas, PA-C  potassium chloride SA (K-DUR,KLOR-CON) 20 MEQ tablet Take 2 tablets (40 mEq total) by mouth  3 (three) times daily. 05/09/14   Farrel Gobble, MD  pravastatin (PRAVACHOL) 80 MG tablet Take 80 mg by mouth at bedtime.    Historical Provider, MD  sennosides-docusate sodium (SENOKOT-S) 8.6-50 MG tablet Take 2 tablets by mouth daily.    Historical Provider, MD    ALLERGIES:  Allergies  Allergen Reactions  . Codeine Hives  . Famvir [Famciclovir]     Patient had edema from knees down while taking. When she stopped taking drug, the edema went away.    SOCIAL HISTORY:  History  Substance Use Topics  . Smoking status: Never Smoker   .  Smokeless tobacco: Never Used  . Alcohol Use: No    FAMILY HISTORY: Family History  Problem Relation Age of Onset  . Heart attack Father   . Cancer Maternal Aunt     colon cancer  . Stroke Maternal Uncle   . Cancer Maternal Grandmother     colon cancer  . Cancer Maternal Aunt     lung cancer  . COPD Daughter     EXAM: BP 101/58 mmHg  Pulse 107  Temp(Src) 98.2 F (36.8 C) (Oral)  Resp 20  Ht 5\' 7"  (1.702 m)  Wt 166 lb (75.297 kg)  BMI 25.99 kg/m2  SpO2 94% CONSTITUTIONAL: Alert and oriented and responds appropriately to questions. Chronically ill-appearing, no apparent distress HEAD: Normocephalic EYES: Conjunctivae clear, PERRL ENT: normal nose; no rhinorrhea; moist mucous membranes; pharynx without lesions noted NECK: Supple, no meningismus, no LAD  CARD: S1 and S2 appreciated; no murmurs, no clicks, no rubs, no gallops, regular and tachycardic RESP: Normal chest excursion without splinting or tachypnea; breath sounds clear and equal bilaterally; no wheezes, no rhonchi, no rales,  ABD/GI: Normal bowel sounds; non-distended; soft, non-tender, no rebound, no guarding; 3x1cm stage 3 sacrotuberous ulcer with some surrounding darkening of the skin but no erythema or warmth or drainage or fluctuance BACK:  The back appears normal and is non-tender to palpation, there is no CVA tenderness EXT: Normal ROM in all joints; non-tender to palpation; no edema; normal capillary refill; no cyanosis    SKIN: Normal color for age and race; warm NEURO: Moves all extremities equally PSYCH: The patient's mood and manner are appropriate. Grooming and personal hygiene are appropriate.   MEDICAL DECISION MAKING: Patient here with acute renal failure and hypercalcemia. Will start IV hydration. Will obtain EKG. We'll discuss with hospitalist for admission.  ED PROGRESS: EKG shows no interval changes. Discussed with Dr. Marily Memos with hospitalist service for admission to inpatient,  telemetry.     EKG Interpretation  Date/Time:  Wednesday September 25 2014 13:52:14 EST Ventricular Rate:  98 PR Interval:  148 QRS Duration: 89 QT Interval:  323 QTC Calculation: 412 R Axis:   94 Text Interpretation:  Sinus rhythm Right axis deviation Abnormal R-wave progression, early transition Confirmed by Hosteen Kienast,  DO, [place] [phone]) on 09/25/2014 2:18:06 PM        I personally performed the services described in this documentation, which was scribed in my presence. The recorded information has been reviewed and is accurate.     [place], DO 09/25/14 1526

## 2014-09-26 ENCOUNTER — Other Ambulatory Visit (HOSPITAL_COMMUNITY): Payer: Self-pay | Admitting: Oncology

## 2014-09-26 ENCOUNTER — Inpatient Hospital Stay (HOSPITAL_COMMUNITY): Payer: Self-pay

## 2014-09-26 DIAGNOSIS — N179 Acute kidney failure, unspecified: Principal | ICD-10-CM

## 2014-09-26 DIAGNOSIS — E43 Unspecified severe protein-calorie malnutrition: Secondary | ICD-10-CM | POA: Insufficient documentation

## 2014-09-26 DIAGNOSIS — L89154 Pressure ulcer of sacral region, stage 4: Secondary | ICD-10-CM

## 2014-09-26 DIAGNOSIS — I10 Essential (primary) hypertension: Secondary | ICD-10-CM

## 2014-09-26 DIAGNOSIS — I5032 Chronic diastolic (congestive) heart failure: Secondary | ICD-10-CM

## 2014-09-26 DIAGNOSIS — E860 Dehydration: Secondary | ICD-10-CM

## 2014-09-26 DIAGNOSIS — C7931 Secondary malignant neoplasm of brain: Secondary | ICD-10-CM

## 2014-09-26 DIAGNOSIS — D72829 Elevated white blood cell count, unspecified: Secondary | ICD-10-CM

## 2014-09-26 DIAGNOSIS — N183 Chronic kidney disease, stage 3 (moderate): Secondary | ICD-10-CM

## 2014-09-26 LAB — BASIC METABOLIC PANEL
Anion gap: 8 (ref 5–15)
BUN: 39 mg/dL — ABNORMAL HIGH (ref 6–23)
CO2: 24 mmol/L (ref 19–32)
Calcium: 11.7 mg/dL — ABNORMAL HIGH (ref 8.4–10.5)
Chloride: 107 mEq/L (ref 96–112)
Creatinine, Ser: 2.51 mg/dL — ABNORMAL HIGH (ref 0.50–1.10)
GFR calc Af Amer: 22 mL/min — ABNORMAL LOW (ref >90)
GFR, EST NON AFRICAN AMERICAN: 19 mL/min — AB (ref >90)
Glucose, Bld: 94 mg/dL (ref 70–99)
Potassium: 4.1 mmol/L (ref 3.5–5.1)
Sodium: 139 mmol/L (ref 135–145)

## 2014-09-26 LAB — CBC
HCT: 25.8 % — ABNORMAL LOW (ref 36.0–46.0)
Hemoglobin: 8.4 g/dL — ABNORMAL LOW (ref 12.0–15.0)
MCH: 29.5 pg (ref 26.0–34.0)
MCHC: 32.6 g/dL (ref 30.0–36.0)
MCV: 90.5 fL (ref 78.0–100.0)
Platelets: 272 10*3/uL (ref 150–400)
RBC: 2.85 MIL/uL — ABNORMAL LOW (ref 3.87–5.11)
RDW: 15.1 % (ref 11.5–15.5)
WBC: 7.2 10*3/uL (ref 4.0–10.5)

## 2014-09-26 LAB — IRON AND TIBC
IRON: 44 ug/dL (ref 42–145)
SATURATION RATIOS: 22 % (ref 20–55)
TIBC: 196 ug/dL — AB (ref 250–470)
UIBC: 152 ug/dL (ref 125–400)

## 2014-09-26 LAB — FERRITIN: FERRITIN: 830 ng/mL — AB (ref 10–291)

## 2014-09-26 LAB — RETICULOCYTES
RBC.: 2.53 MIL/uL — ABNORMAL LOW (ref 3.87–5.11)
Retic Count, Absolute: 25.3 10*3/uL (ref 19.0–186.0)
Retic Ct Pct: 1 % (ref 0.4–3.1)

## 2014-09-26 LAB — VITAMIN B12: Vitamin B-12: 334 pg/mL (ref 211–911)

## 2014-09-26 LAB — FOLATE: Folate: 4.4 ng/mL

## 2014-09-26 MED ORDER — BOOST HIGH PROTEIN PO LIQD
237.0000 mL | Freq: Three times a day (TID) | ORAL | Status: DC
  Administered 2014-09-26 – 2014-10-03 (×20): 237 mL via ORAL
  Filled 2014-09-26 (×25): qty 237

## 2014-09-26 MED ORDER — OXYCODONE HCL 5 MG PO TABS
5.0000 mg | ORAL_TABLET | Freq: Four times a day (QID) | ORAL | Status: DC | PRN
  Administered 2014-09-26 – 2014-09-29 (×9): 10 mg via ORAL
  Administered 2014-09-29: 5 mg via ORAL
  Administered 2014-09-30 – 2014-10-03 (×6): 10 mg via ORAL
  Filled 2014-09-26 (×16): qty 2

## 2014-09-26 MED ORDER — MORPHINE SULFATE 2 MG/ML IJ SOLN
2.0000 mg | INTRAMUSCULAR | Status: DC | PRN
  Administered 2014-09-26 – 2014-10-01 (×16): 2 mg via INTRAVENOUS
  Filled 2014-09-26 (×17): qty 1

## 2014-09-26 NOTE — Evaluation (Signed)
Occupational Therapy Evaluation Patient Details Name: LICHELLE VIETS MRN: [id number] DOB: 12-12-1950 Today's Date: 09/26/2014    History of Present Illness Pt reports feeling progressive generalized weakness over the past 2 weeks. Denies fevers, CP, SOB, syncope, dysuria, frequency. Becomes winded w/ exertion. 1 week h/o sacral pain and discharge. H2O2 and duoderm placement w/o benefit.  Very little fluid intake during this time. Pt is currently undergoing chemotherapy for bilateral breast cancer (s/p double mastectomy). Last chemo on 08/14/14. Pt presented to oncology outpt dept today w/ symptoms and was sent to the emergency room due to reported renal failure, weakness and a sacral ulcer.    Clinical Impression   Pt is presenting to acute OT with above situation.  She has generalized weakness in BUE, but pt was able to demonstrate good skills in standing dressing tasks.  Pt fatigues easily and has low activity tolerance.  Pt verbalizes increased fatigue during ADLs recently and increased assist from son and daughter for IADLs.  After d/c, pt's daughter will be able to be with pt 24/7.  Recommend HHOT for increased BUE strength, increased ADL status, and education about energy conservation.  No equipment needs at this time.    Follow Up Recommendations  Home health OT    Equipment Recommendations  None recommended by OT    Recommendations for Other Services       Precautions / Restrictions Precautions Precautions: Fall Restrictions Weight Bearing Restrictions: No      Mobility Bed Mobility Overal bed mobility: Modified Independent                Transfers Overall transfer level: Needs assistance Equipment used: None Transfers: Sit to/from Stand Sit to Stand: Min guard         General transfer comment: min guard for balance during transfer.  min assist needed for LOB during ambulation.    Balance Overall balance assessment: Needs assistance Sitting-balance support:  No upper extremity supported;Feet supported Sitting balance-Leahy Scale: Good     Standing balance support: No upper extremity supported;During functional activity Standing balance-Leahy Scale: Fair                              ADL Overall ADL's : Needs assistance/impaired Eating/Feeding: Modified independent   Grooming: Modified independent   Upper Body Bathing: Min guard;Standing               Toilet Transfer: Min guard;Stand-pivot                   Vision                     Perception     Praxis      Pertinent Vitals/Pain Pain Assessment: No/denies pain (Patient denies pain but does show physical signs of pain with mobility; pt reports she had received pain medicine recently)     Hand Dominance Right   Extremity/Trunk Assessment Upper Extremity Assessment Upper Extremity Assessment: Generalized weakness;Overall WFL for tasks assessed;RUE deficits/detail RUE Deficits / Details: $+/5 R shoulder; 4/5 right elbow RUE Sensation: decreased light touch;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)   Lower Extremity Assessment Lower Extremity Assessment: Generalized weakness (B legs grossly 4- to 4/5)       Communication Communication Communication: No difficulties   Cognition Arousal/Alertness: Awake/alert Behavior During Therapy: WFL for tasks assessed/performed Overall Cognitive Status: Within Functional Limits for tasks assessed  General Comments       Exercises       Shoulder Instructions      Home Living Family/patient expects to be discharged to:: Private residence Living Arrangements: Other relatives (65 year old grandson; pt also states her daughter and son have been able to assist more around her home) Available Help at Discharge: Family;Available 24 hours/day (Daughter taking leave of abscence from work to assist patient) Type of Home: House Home Access: Stairs to enter Engineer, site of Steps: 2 Entrance Stairs-Rails: None Home Layout: One level     Bathroom Shower/Tub: Teacher, early years/pre: Handicapped height     Home Equipment: Shower seat;Grab bars - tub/shower;Grab bars - toilet          Prior Functioning/Environment Level of Independence: Independent with assistive device(s)        Comments: Patient states that she has been able to perform tasks around her home, but just needs extended time to do so    OT Diagnosis: Generalized weakness (Decreased ADL status)   OT Problem List: Decreased strength;Decreased knowledge of use of DME or AE;Decreased activity tolerance;Impaired balance (sitting and/or standing);Pain;Impaired sensation   OT Treatment/Interventions: Self-care/ADL training;Therapeutic exercise;Patient/family education;Energy conservation;DME and/or AE instruction;Therapeutic activities;Balance training    OT Goals(Current goals can be found in the care plan section) Acute Rehab OT Goals Patient Stated Goal: No OT goals stated OT Goal Formulation: With patient Time For Goal Achievement: 10/10/14 Potential to Achieve Goals: Fair ADL Goals Pt Will Transfer to Toilet: with supervision Pt Will Perform Tub/Shower Transfer: with supervision Pt/caregiver will Perform Home Exercise Program: Increased strength;Both right and left upper extremity  OT Frequency: Min 2X/week   Barriers to D/C:            Co-evaluation              End of Session Equipment Utilized During Treatment: Gait belt  Activity Tolerance: Patient tolerated treatment well;Patient limited by fatigue Patient left: in bed;with call bell/phone within reach;with bed alarm set   Time: 7035-0093 OT Time Calculation (min): 24 min Charges:  OT General Charges $OT Visit: 1 Procedure OT Evaluation $Initial OT Evaluation Tier I: 1 Procedure G-Codes:     Bea Graff Ponce Skillman, MS, OTR/L [facility] [phone] 09/26/2014, 2:42 PM

## 2014-09-26 NOTE — Progress Notes (Signed)
INITIAL NUTRITION ASSESSMENT  DOCUMENTATION CODES Per approved criteria  -Severe malnutrition in the context of acute illness or injury   Pt meets criteria for severe MALNUTRITION in the context of acute illness as evidenced by 10.6% wt loss x 1 month, mild to moderate fat and muscle depletion, <75% estimated energy intake x 1 month.  INTERVENTION: -D/c Ensure Complete TID -Boost Plus TID, per pt request  NUTRITION DIAGNOSIS: Increased nutrient needs related to wound healing, cancer treatment as evidenced by estimated needs.   Goal: Pt will meet >90% of estimated nutritional needs  Monitor:  PO/supplement intake, labs, weight changes, I/O's  Reason for Assessment: MST=4, Consult to assess needs  64 y.o. female  Admitting Dx: Acute renal failure superimposed on stage 3 chronic kidney disease  Erika Nunez is a 64 y.o. female  Pt reports feeling progressive generalized weakness over the past 2 weeks. Denies fevers, CP, SOB, syncope, dysuria, frequency. Becomes winded w/ exertion. 1 week h/o sacral pain and discharge. H2O2 and duoderm placement w/o benefit. Very little fluid intake during this time. Pt is currently undergoing chemotherapy for bilateral breast cancer (s/p double mastectomy). Last chemo on 08/14/14. Pt presented to oncology outpt dept today w/ symptoms and was sent to the emergency room due to reported renal failure, weakness and a sacral ulcer.   ASSESSMENT: Hx obtained by both pt and pt daughter at bedside. Both confirm a general decline in health over the past month. Pt reports that she has been taking chemo since August and was doing well until about one month ago. She confirms poor appetite and weight loss over the past month. Pt daughter reports that pt has had decline in mobility (has been mostly bedbound over the past month, however, fairly independent at baseline). She estimates she developed pressure ulcer at least 2 weeks ago. Noted wound nurse was consulted  and CT findings are consistent with osteomyelitis. Pt has been started on antibiotics.  Pt daughter started her on 2-3 Boost Plus supplements daily due to extremely poor po intake. Pt denies difficulty chewing or swallowing foods and admits that she has had a lack of appetite over the past month. She admits that her appetite has improved since admission, consuming about 50% of her breakfast this morning. She has been consuming Ensure supplements that have been ordered, however, requests change to Boost for consistency with outpatient regimen.  Pt confirms UBW around 195-198#. Both confirm drastic weight loss over the past mont, which is consistent with documented wt hx of 20# (10.6%) wt loss in the past past month.  Educated pt and pt daughter on importance of good PO intake to sustain muscle mass, prevent further weight loss, and facilitate wound healing. Encouraged continued use of supplements both as an inpatient and at home. Pt and daughter very grateful for RD visit.  Labs reviewed. BUN/Creat: 39/2.51, Calcium: 11.7.   Nutrition Focused Physical Exam:  Subcutaneous Fat:  Orbital Region: moderate depletion Upper Arm Region: mild depletion Thoracic and Lumbar Region: WDL  Muscle:  Temple Region: moderate depletion Clavicle Bone Region: WDL Clavicle and Acromion Bone Region: WDL Scapular Bone Region: WDL Dorsal Hand: mild depletion Patellar Region: moderate depletion Anterior Thigh Region: moderate depletion Posterior Calf Region: moderate depletion  Edema: none present  Height: Ht Readings from Last 1 Encounters:  09/25/14 5\' 7"  (1.702 m)    Weight: Wt Readings from Last 1 Encounters:  09/25/14 166 lb (75.297 kg)    Ideal Body Weight: 135#  % Ideal Body Weight:  123%  Wt Readings from Last 10 Encounters:  09/25/14 166 lb (75.297 kg)  09/25/14 166 lb 9.6 oz (75.569 kg)  09/25/14 166 lb (75.297 kg)  08/14/14 186 lb 12.8 oz (84.732 kg)  07/03/14 192 lb 4.8 oz (87.227 kg)   06/12/14 193 lb 6.4 oz (87.726 kg)  05/22/14 195 lb 3.2 oz (88.542 kg)  05/22/14 195 lb 3.2 oz (88.542 kg)  05/09/14 197 lb 6.4 oz (89.54 kg)  05/01/14 198 lb 6.4 oz (89.994 kg)    Usual Body Weight: 198#  % Usual Body Weight: 84%  BMI:  Body mass index is 25.99 kg/(m^2). Overweight  Estimated Nutritional Needs: Kcal: 2200-2400 Protein: 103-113 grams Fluid: > 2,0 L  Skin: stage IV sacral pressure ulcer  Diet Order:  Heart Healthy  EDUCATION NEEDS: -Education needs addressed  No intake or output data in the 24 hours ending 09/26/14 1119  Last BM: 09/18/14  Labs:   Recent Labs Lab 09/25/14 1016 09/25/14 2152 09/26/14 0641  NA 136 135 139  K 3.6 3.4* 4.1  CL 104 104 107  CO2 18* 22 24  BUN 47* 44* 39*  CREATININE 3.43* 3.03* 2.51*  CALCIUM 12.3* 11.5* 11.7*  GLUCOSE 168* 86 94    CBG (last 3)  No results for input(s): GLUCAP in the last 72 hours.  Scheduled Meds: . ceFEPime (MAXIPIME) IV  1 g Intravenous Q24H  . feeding supplement (ENSURE)  1 Container Oral TID BM  . fentaNYL  75 mcg Transdermal Q72H  . gabapentin  300 mg Oral BID  . heparin  5,000 Units Subcutaneous 3 times per day  . hydrochlorothiazide  25 mg Oral Daily  . potassium chloride SA  40 mEq Oral TID  . pravastatin  80 mg Oral QHS  . senna  1 tablet Oral BID  . sodium chloride  3 mL Intravenous Q12H  . [START ON 09/27/2014] vancomycin  1,000 mg Intravenous Q48H    Continuous Infusions:   Past Medical History  Diagnosis Date  . Hypertension   . High cholesterol   . Cancer   . Breast cancer   . Allergy     seasonal allergies  . Arthritis   . Anxiety   . Breast mass, left 2014  . Breast mass, right 2014  . 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 Depression   . Lymphedema of arm     right arm    Past Surgical History  Procedure Laterality Date  . Cholecystectomy    . Tubal ligation    . Tonsillectomy    . Breast biopsy  10/02/2012    Procedure: BREAST  BIOPSY;  Surgeon: Donato Heinz, MD;  Location: [facility];  Service: General;  Laterality: Right;  . Portacath placement Left 10/20/2012  . Breast biopsy Left 10/20/2012    Procedure: BREAST BIOPSY;  Surgeon: Donato Heinz, MD;  Location: [facility];  Service: General;  Laterality: Left;  Procedure end 1142  . Portacath placement Left 10/20/2012    Procedure: INSERTION PORT-A-CATH;  Surgeon: Donato Heinz, MD;  Location: [facility];  Service: General;  Laterality: Left;  Procedure began 4268; left subclavian  . Simple mastectomy with axillary sentinel node biopsy Bilateral 04/25/2013    Procedure: SIMPLE MASTECTOMY;  Surgeon: Donato Heinz, MD;  Location: [facility];  Service: General;  Laterality: Bilateral;  . Craniotomy Right 02/26/2014    Procedure: suboccipital craniotomy for tumor resection;  Surgeon: Consuella Lose, MD;  Location: [place] [facility];  Service: Neurosurgery;  Laterality: Right;  suboccipital craniotomy for tumor resection  . Skin biopsy Right 04/15/2014    right lateral chest    Ezekeil Bethel A. Jimmye Norman, RD, LDN, CDE Pager: [phone]

## 2014-09-26 NOTE — Consult Note (Signed)
[facility] Consultation Oncology  Name: CRESCENT GOTHAM      MRN: [id number]    Location: A329/A329-01  Date: 09/26/2014 Time:8:24 AM   REFERRING PHYSICIAN:  Self referral  REASON FOR CONSULT:  Discussion of goals of care   DIAGNOSIS:  Stage IV metastatic breast cancer  HISTORY OF PRESENT ILLNESS:   Puja is a 64 year old white female who is well known to the [facility] where she has been undergoing active treatment for quite some time for metastatic breast cancer with a very complicated oncology case/history.    Breast cancer   10/02/2012 Initial Diagnosis Breast cancer, Her2+, ER-, PR-   10/23/2012 - 02/12/2013 Chemotherapy TCH x 6 cycles   03/05/2013 - 11/05/2013 Chemotherapy Herceptin/Perjeta   06/04/2013 - 04/24/2014 Chemotherapy Arimidex daily   02/20/2014 Relapse/Recurrence CT head- New mass lesion involving the right cerebellar hemisphere with associated vasogenic edema   02/25/2014 Definitive Surgery 1. Suboccipital craniectomy 2. Resection of tumor   3. Use of intraoperative microscope for microdissection    02/25/2014 Pathology Results 1. Brain, for tumor resection, Right cerebellar- metastatic carcinoma 2. Brain, for tumor resection, Right cerebellar- metastatic carcinoma. 3. Brain, for tumor resection, Right cerebellar- metastatic carcinoma.  HER2-. ER/PR 0%, Ki-67 92%.   04/01/2014 Relapse/Recurrence PET- Marked hypermetabolism associated with the abnormal and irregular thickening of the skin over the mid and lower right lateral and post for lateral chest wall.   04/15/2014 Procedure Soft tissue mass, biopsy, right chest wall by Dr. Marlou Starks showing invasive carcinoma with LVI.  HER2+, ER 2%, PR 0%, Ki-67 87%.   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   She was in the clinic yesterday, 09/25/2014, for systemic treatment, but  she looked ill and labs demonstrated a mild leukocytosis with acute renal failure.  Therefore, treatment was held and she was escorted to the ED for hospitalization.  I personally reviewed and went over laboratory results with the patient.  The results are noted within this dictation.  I personally reviewed and went over radiographic studies with the patient.  The results are noted within this dictation.  MR of pelvis demonstrates a Stage IV decubitus ulcer and concerns for osteomyelitis.  She is on IV antibiotics for this.  Chart reviewed.  Oncology history updated.  We had a long discussion regarding her cancer.  I re-educated her on her breast cancer being stage IV and incurable.  "Should I stop everything?"  I educated her that that is always an option, but that is not the role of this discussion today.  The role of today's discussion is for the patient to start thinking of end-of-life and goals-of-care.  We had a pleasant discussion and a reality check regarding her malignancy.  I then moved on to code status and patient reports that she wishes to be a DNR.  Her code status is updated. DNR form is signed and in chart.  Outpatient code status is also updated.  We will re-visit the patient later today.   PAST MEDICAL HISTORY:   Past Medical History  Diagnosis Date  . Hypertension   . High cholesterol   . Cancer   . Breast cancer   . Allergy     seasonal allergies  . Arthritis   . Anxiety   . Breast mass, left 2014  . Breast mass, right 2014  .  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 Depression   . Lymphedema of arm     right arm    ALLERGIES: Allergies  Allergen Reactions  . Codeine Hives  . Famvir [Famciclovir]     Patient had edema from knees down while taking. When she stopped taking drug, the edema went away.      MEDICATIONS: I have reviewed the patient's current medications.     PAST SURGICAL HISTORY Past Surgical History  Procedure  Laterality Date  . Cholecystectomy    . Tubal ligation    . Tonsillectomy    . Breast biopsy  10/02/2012    Procedure: BREAST BIOPSY;  Surgeon: Donato Heinz, MD;  Location: [facility];  Service: General;  Laterality: Right;  . Portacath placement Left 10/20/2012  . Breast biopsy Left 10/20/2012    Procedure: BREAST BIOPSY;  Surgeon: Donato Heinz, MD;  Location: [facility];  Service: General;  Laterality: Left;  Procedure end 1142  . Portacath placement Left 10/20/2012    Procedure: INSERTION PORT-A-CATH;  Surgeon: Donato Heinz, MD;  Location: [facility];  Service: General;  Laterality: Left;  Procedure began 1610; left subclavian  . Simple mastectomy with axillary sentinel node biopsy Bilateral 04/25/2013    Procedure: SIMPLE MASTECTOMY;  Surgeon: Donato Heinz, MD;  Location: [facility];  Service: General;  Laterality: Bilateral;  . Craniotomy Right 02/26/2014    Procedure: suboccipital craniotomy for tumor resection;  Surgeon: Consuella Lose, MD;  Location: [place] [facility];  Service: Neurosurgery;  Laterality: Right;  suboccipital craniotomy for tumor resection  . Skin biopsy Right 04/15/2014    right lateral chest    FAMILY HISTORY: Family History  Problem Relation Age of Onset  . Heart attack Father   . Cancer Maternal Aunt     colon cancer  . Stroke Maternal Uncle   . Cancer Maternal Grandmother     colon cancer  . Cancer Maternal Aunt     lung cancer  . COPD Daughter     SOCIAL HISTORY:  reports that she has never smoked. She has never used smokeless tobacco. She reports that she does not drink alcohol or use illicit drugs.  PERFORMANCE STATUS: The patient's performance status is 3 - Symptomatic, >50% confined to bed  PHYSICAL EXAM: Most Recent Vital Signs: Blood pressure 103/44, pulse 82, temperature 98.2 F (36.8 C), temperature source Oral, resp. rate 17, height 5' 7"  (1.702 m), weight 166 lb (75.297 kg), SpO2 100 %. General appearance: alert, cooperative, appears older than stated  age, fatigued, no distress and pale Head: Normocephalic, without obvious abnormality, atraumatic Eyes: conjunctivae/corneas clear. PERRL, EOM's intact. Fundi benign. Neck: no adenopathy and supple, symmetrical, trachea midline Lungs: clear to auscultation bilaterally Heart: regular rate and rhythm, S1, S2 normal, no murmur, click, rub or gallop Extremities: extremities normal, atraumatic, no cyanosis or edema Neurologic: Grossly normal  LABORATORY DATA:  Results for orders placed or performed during the hospital encounter of 09/25/14 (from the past 48 hour(s))  Wound culture     Status: None (Preliminary result)   Collection Time: 09/25/14  3:45 PM  Result Value Ref Range   Specimen Description WOUND SACRAL    Special Requests NONE    Gram Stain PENDING    Culture      NO GROWTH 1 DAY Performed at [facility]    Report Status PENDING   Basic metabolic panel     Status: Abnormal   Collection Time: 09/25/14  9:52 PM  Result Value Ref Range   Sodium 135 135 - 145 mmol/L    Comment: Please note change in reference range.   Potassium 3.4 (L) 3.5 - 5.1 mmol/L    Comment: Please note change in reference range.   Chloride 104 96 - 112 mEq/L   CO2 22 19 - 32 mmol/L   Glucose, Bld 86 70 - 99 mg/dL   BUN 44 (H) 6 - 23 mg/dL   Creatinine, Ser 3.03 (H) 0.50 - 1.10 mg/dL   Calcium 11.5 (H) 8.4 - 10.5 mg/dL   GFR calc non Af Amer 15 (L) >90 mL/min   GFR calc Af Amer 18 (L) >90 mL/min    Comment: (NOTE) The eGFR has been calculated using the CKD EPI equation. This calculation has not been validated in all clinical situations. eGFR's persistently <90 mL/min signify possible Chronic Kidney Disease.    Anion gap 9 5 - 15  Basic metabolic panel     Status: Abnormal   Collection Time: 09/26/14  6:41 AM  Result Value Ref Range   Sodium 139 135 - 145 mmol/L    Comment: Please note change in reference range.   Potassium 4.1 3.5 - 5.1 mmol/L    Comment: DELTA CHECK NOTED Please  note change in reference range.    Chloride 107 96 - 112 mEq/L   CO2 24 19 - 32 mmol/L   Glucose, Bld 94 70 - 99 mg/dL   BUN 39 (H) 6 - 23 mg/dL   Creatinine, Ser 2.51 (H) 0.50 - 1.10 mg/dL   Calcium 11.7 (H) 8.4 - 10.5 mg/dL   GFR calc non Af Amer 19 (L) >90 mL/min   GFR calc Af Amer 22 (L) >90 mL/min    Comment: (NOTE) The eGFR has been calculated using the CKD EPI equation. This calculation has not been validated in all clinical situations. eGFR's persistently <90 mL/min signify possible Chronic Kidney Disease.    Anion gap 8 5 - 15  CBC     Status: Abnormal   Collection Time: 09/26/14  6:41 AM  Result Value Ref Range   WBC 7.2 4.0 - 10.5 K/uL   RBC 2.85 (L) 3.87 - 5.11 MIL/uL   Hemoglobin 8.4 (L) 12.0 - 15.0 g/dL   HCT 25.8 (L) 36.0 - 46.0 %   MCV 90.5 78.0 - 100.0 fL   MCH 29.5 26.0 - 34.0 pg   MCHC 32.6 30.0 - 36.0 g/dL   RDW 15.1 11.5 - 15.5 %   Platelets 272 150 - 400 K/uL    Comment: DELTA CHECK NOTED      RADIOGRAPHY: Mr Pelvis Wo Contrast  09/25/2014   CLINICAL DATA:  Open wound with drainage over the coccyx. Evaluate for osteomyelitis. History of breast and brain cancer.  EXAM: MRI PELVIS WITHOUT CONTRAST  TECHNIQUE: Multiplanar multisequence MR imaging of the pelvis was performed. No intravenous contrast was administered.  COMPARISON:  PET-CT 04/01/2014.  FINDINGS: Examination is mildly degraded by motion. No intravenous contrast was administered.  There is deep soft tissue ulceration inferiorly over the inguinal crease, extending superiorly around the coccyx. There is an ill-defined approximately 3.8 x 3.7 cm focus of inflammation in this area with central low-density which probably reflects gas. Although limited by motion, the perirectal fat planes are preserved. There is no definite perirectal/perianal fistula. No intrapelvic inflammatory changes or fluid collections are seen.  Best seen on the sagittal images is T2 hyperintensity within the coccyx worrisome for  early osteomyelitis. The sacrum and  remainder of the visualized bony pelvis appear intact. There is no significant hip joint effusion. Both femoral heads appear normal.  Uterine fibroids are noted.  There is no adnexal mass.  IMPRESSION: 1. Decubitus ulceration over the coccyx with underlying inflammation but no well-defined abscess. No definite intrapelvic extension or associated perirectal/perianal fistula. 2. Abnormal T2 signal in the distal coccyx worrisome for osteomyelitis. 3. Given the motion limitations of this examination, CT may be helpful for further evaluation (and better tolerated by this patient).   Electronically Signed   By: Camie Patience M.D.   On: 09/25/2014 19:47       PATHOLOGY:  None  ASSESSMENT:  1. Acute on chronic renal disease, improving. 2. Hypercalcemia, secondary to dehydration, improving 3. 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 Leukocytosis, resolved with antibiotics, secondary to #3. 5. Normochromic, normocytic anemia. 6. Stage IV Breast Cancer, last treated on 08/14/2014 7. DNR    Breast cancer   10/02/2012 Initial Diagnosis Breast cancer, Her2+, ER-, PR-   10/23/2012 - 02/12/2013 Chemotherapy TCH x 6 cycles   03/05/2013 - 11/05/2013 Chemotherapy Herceptin/Perjeta   06/04/2013 - 04/24/2014 Chemotherapy Arimidex daily   02/20/2014 Relapse/Recurrence CT head- New mass lesion involving the right cerebellar hemisphere with associated vasogenic edema   02/25/2014 Definitive Surgery 1. Suboccipital craniectomy 2. Resection of tumor   3. Use of intraoperative microscope for microdissection    02/25/2014 Pathology Results 1. Brain, for tumor resection, Right cerebellar- metastatic carcinoma 2. Brain, for tumor resection, Right cerebellar- metastatic carcinoma. 3. Brain, for tumor resection, Right cerebellar- metastatic carcinoma.  HER2-. ER/PR 0%, Ki-67 92%.   04/01/2014 Relapse/Recurrence PET- Marked hypermetabolism associated with the abnormal and  irregular thickening of the skin over the mid and lower right lateral and post for lateral chest wall.   04/15/2014 Procedure Soft tissue mass, biopsy, right chest wall by Dr. Marlou Starks showing invasive carcinoma with LVI.  HER2+, ER 2%, PR 0%, Ki-67 87%.   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    Adjuvant Chemotherapy      PLAN:  1. I personally reviewed and went over laboratory results with the patient.  The results are noted within this dictation. 2. I personally reviewed and went over radiographic studies with the patient.  The results are noted within this dictation.   3. Chart reviewed 4. Labs today: Anemia panel 5. Goals of care discussion started.  She wishes to be a DNR.  Will re-visit later today.  Addendum will follow.  All questions were answered. The patient knows to call the clinic with any problems, questions or concerns. We can certainly see the patient much sooner if necessary.  Patient and plan discussed with Dr. Ancil Linsey and she is in agreement with the aforementioned.   KEFALAS,THOMAS  09/26/2014    Addendum: She is fine with her DNR status. She reports that Dr. Roderic Palau reports 6-8 weeks of IV antibiotics.  During this time, treatment will be held due to this infection.  I have nothing more to contribute to the patient's care at this time.  Her decubitus ulcer is a sign of malnutrition and failure to thrive secondary to her malignancy.  Discussions regarding her malignancy and possible progression of disease discussed and patient is prepped.   KEFALAS,THOMAS 09/26/2014    As above.  Patient appeared quite ill during office  visit yesterday.  Also noted to have decubitus ulcer, poor PS and progressive disease visible on R anterior chest wall, flank and upper abdomen. Poor candidate for additional chemotherapy. Admitted for  further management of ARF, decubitus. Donald Pore MD

## 2014-09-26 NOTE — Progress Notes (Signed)
[facility] PROGRESS NOTE  PENDA VENTURI HRC:[id number] DOB: 01-Dec-1950 DOA: 09/25/2014 PCP: [name], MD  Assessment/Plan: 1. 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 Patient will need a prolonged course of intravenous antibiotics of approximately 6 weeks. Continue current treatment for now. She will likely need local wound care since surgical management is not an option. 2. Dehydration. Continue with IV fluids 3. AKI on CKD stage III. Likely related to dehydration. Continue current treatments. Creatinine appears improving 4. Stage IV breast cancer. Discussed with oncology and at present she does not appear to be a candidate for further treatment. We'll discuss further goals of care. Oncology is considering hospice referral, but they will discuss this further with the patient. 5. Chronic diastolic congestive heart failure. Appears compensated. 6. Hypercalcemia, likely related to dehydration. Improving with IV fluids 7. Essential hypertension. Patient was hypotensive on admission. ACE inhibitor and hydrochlorothiazide currently on hold. 8. Chronic pain. Continue fentanyl patch and oxycodone.  Code Status: DO NOT RESUSCITATE Family Communication: Discussed with patient and daughter at the bedside Disposition Plan: Pending hospital course   Consultants:  Oncology  Procedures:    Antibiotics:  Vancomycin 1/13>>  Cefepime 1/13>>  HPI/Subjective: Complaints of pain in her sacral area.  Objective: Filed Vitals:   09/26/14 1425  BP: 104/50  Pulse: 87  Temp: 98.1 F (36.7 C)  Resp: 18    Intake/Output Summary (Last 24 hours) at 09/26/14 1922 Last data filed at 09/26/14 1900  Gross per 24 hour  Intake    720 ml  Output      0 ml  Net    720 ml   Filed Weights   09/25/14 1221  Weight: 75.297 kg (166 lb)    Exam:   General:  No acute distress  Cardiovascular: S1, S2, regular rate and rhythm  Respiratory:  Auscultation bilaterally  Abdomen: Soft, nontender, positive bowel sounds  Musculoskeletal: Stage IV sacral decubitus ulcer, no significant surrounding erythema. No significant foul smell  Data Reviewed: Basic Metabolic Panel:  Recent Labs Lab 09/25/14 1016 09/25/14 2152 09/26/14 0641  NA 136 135 139  K 3.6 3.4* 4.1  CL 104 104 107  CO2 18* 22 24  GLUCOSE 168* 86 94  BUN 47* 44* 39*  CREATININE 3.43* 3.03* 2.51*  CALCIUM 12.3* 11.5* 11.7*   Liver Function Tests:  Recent Labs Lab 09/25/14 1016  AST 34  ALT 30  ALKPHOS 119*  BILITOT 0.6  PROT 7.3  ALBUMIN 3.4*   No results for input(s): LIPASE, AMYLASE in the last 168 hours. No results for input(s): AMMONIA in the last 168 hours. CBC:  Recent Labs Lab 09/25/14 1016 09/26/14 0641  WBC 11.8* 7.2  NEUTROABS 10.3*  --   HGB 9.3* 8.4*  HCT 28.0* 25.8*  MCV 90.3 90.5  PLT 391 272   Cardiac Enzymes: No results for input(s): CKTOTAL, CKMB, CKMBINDEX, TROPONINI in the last 168 hours. BNP (last 3 results) No results for input(s): PROBNP in the last 8760 hours. CBG: No results for input(s): GLUCAP in the last 168 hours.  Recent Results (from the past 240 hour(s))  Culture, blood (routine x 2)     Status: None (Preliminary result)   Collection Time: 09/25/14 10:05 AM  Result Value Ref Range Status   Specimen Description BLOOD LEFT ARM  Final   Special Requests   Final    BOTTLES DRAWN AEROBIC AND ANAEROBIC AEB=8CC ANA=10CC   Culture NO GROWTH 1 DAY  Final  Report Status PENDING  Incomplete  Culture, blood (routine x 2)     Status: None (Preliminary result)   Collection Time: 09/25/14 10:10 AM  Result Value Ref Range Status   Specimen Description BLOOD LEFT WRIST  Final   Special Requests BOTTLES DRAWN AEROBIC AND ANAEROBIC 6CC  Final   Culture NO GROWTH 1 DAY  Final   Report Status PENDING  Incomplete  Wound culture     Status: None (Preliminary result)   Collection Time: 09/25/14  3:45 PM  Result Value  Ref Range Status   Specimen Description WOUND SACRAL  Final   Special Requests NONE  Final   Gram Stain   Final    FEW WBC PRESENT,BOTH PMN AND MONONUCLEAR NO SQUAMOUS EPITHELIAL CELLS SEEN FEW GRAM POSITIVE COCCI IN PAIRS FEW GRAM NEGATIVE RODS Performed at [facility]    Culture   Final    NO GROWTH 1 DAY Performed at [facility]    Report Status PENDING  Incomplete     Studies: Ct Pelvis Wo Contrast  09/26/2014   CLINICAL DATA:  Midline open wound with layer of adipose tissue exposed measuring 2 cm with white discharge, evaluate for osteomyelitis  EXAM: CT PELVIS WITHOUT CONTRAST  TECHNIQUE: Multidetector CT imaging of the pelvis was performed following the standard protocol without intravenous contrast.  COMPARISON:  09/25/2014 MRI  FINDINGS: There is a decubitus ulcer over the coccyx. Air extends from the dermis all the way down to the coccyx, 2 is subcutaneous depth of 3.1 cm. No appreciable organized fluid collection on this noncontrast study although a small abscess of less than or equal to 2 cm cannot be excluded. The tract of inflammation measures a maximum diameter of about 17 mm in width. The posterior cortex of the coccyx at this level is difficult to ascertain and therefore osteomyelitis is suspected.  Bladder and pelvic organs appear unremarkable. There is calcification of the aorta. Appendix is normal. No free fluid in the visualized portion of the pelvis.  IMPRESSION: The findings are consistent with decubitus ulcer and are concerning for small focus of osteomyelitis involving the mid coccyx.   Electronically Signed   By: Skipper Cliche M.D.   On: 09/26/2014 10:12   Mr Pelvis Wo Contrast  09/25/2014   CLINICAL DATA:  Open wound with drainage over the coccyx. Evaluate for osteomyelitis. History of breast and brain cancer.  EXAM: MRI PELVIS WITHOUT CONTRAST  TECHNIQUE: Multiplanar multisequence MR imaging of the pelvis was performed. No intravenous contrast was  administered.  COMPARISON:  PET-CT 04/01/2014.  FINDINGS: Examination is mildly degraded by motion. No intravenous contrast was administered.  There is deep soft tissue ulceration inferiorly over the inguinal crease, extending superiorly around the coccyx. There is an ill-defined approximately 3.8 x 3.7 cm focus of inflammation in this area with central low-density which probably reflects gas. Although limited by motion, the perirectal fat planes are preserved. There is no definite perirectal/perianal fistula. No intrapelvic inflammatory changes or fluid collections are seen.  Best seen on the sagittal images is T2 hyperintensity within the coccyx worrisome for early osteomyelitis. The sacrum and remainder of the visualized bony pelvis appear intact. There is no significant hip joint effusion. Both femoral heads appear normal.  Uterine fibroids are noted.  There is no adnexal mass.  IMPRESSION: 1. Decubitus ulceration over the coccyx with underlying inflammation but no well-defined abscess. No definite intrapelvic extension or associated perirectal/perianal fistula. 2. Abnormal T2 signal in the distal coccyx worrisome  for osteomyelitis. 3. Given the motion limitations of this examination, CT may be helpful for further evaluation (and better tolerated by this patient).   Electronically Signed   By: Camie Patience M.D.   On: 09/25/2014 19:47    Scheduled Meds: . ceFEPime (MAXIPIME) IV  1 g Intravenous Q24H  . feeding supplement  237 mL Oral TID BM  . fentaNYL  75 mcg Transdermal Q72H  . gabapentin  300 mg Oral BID  . heparin  5,000 Units Subcutaneous 3 times per day  . hydrochlorothiazide  25 mg Oral Daily  . potassium chloride SA  40 mEq Oral TID  . pravastatin  80 mg Oral QHS  . senna  1 tablet Oral BID  . sodium chloride  3 mL Intravenous Q12H  . [START ON 09/27/2014] vancomycin  1,000 mg Intravenous Q48H   Continuous Infusions:   Principal Problem:   Acute renal failure superimposed on stage 3  chronic kidney disease Active Problems:   Breast cancer   Hypertension   Metastasis from breast cancer   Hypercalcemia   Physical deconditioning   Stage IV pressure ulcer of sacral region   Chronic diastolic CHF (congestive heart failure)   Constipation   HLD (hyperlipidemia)   Protein-calorie malnutrition, severe    Time spent: 19mins    MEMON,JEHANZEB  [facility] Pager [phone]. If 7PM-7AM, please contact night-coverage at [email or web address], password [facility] 09/26/2014, 7:22 PM  LOS: 1 day

## 2014-09-26 NOTE — Progress Notes (Signed)
UR chart review completed.  

## 2014-09-26 NOTE — Consult Note (Addendum)
Pt pending CT scan to rule out osteomyelitis in the sacral region.  I have contacted the bedside nurse to verify the plans for the CT.  Noted new orders placed after our conversation for the CT scan. I will hold off on the consultation until CT results, per the MD notes plans for ortho or surgical consultation after CT if needed.  If no osteomyelitis, WOC will plan to see this patient this afternoon to recommend appropriate topical wound care.  Rilynn Habel Liane Comber RN, [place] [phone]  [phone] Addendum: CT results reviewed:  IMPRESSION: The findings are consistent with decubitus ulcer and are concerning for small focus of osteomyelitis involving the mid coccyx. The treatment of osteomylitis is considered outside of the scope of practice for the [facility] nurse, would recommend orthopedic or surgical consultation for this reason.   Marica Otter RN,CWOCN

## 2014-09-26 NOTE — Care Management Note (Addendum)
    Page 1 of 2   10/03/2014     11:14:17 AM CARE MANAGEMENT NOTE 10/03/2014  Patient:  Erika Nunez, Erika Nunez   Account Number:  [id number]  Date Initiated:  09/26/2014  Documentation initiated by:  Theophilus Kinds  Subjective/Objective Assessment:   Pt admitted from home with ARF and possible osteomylitis. Pt lives with her grandson and wants to return home at discharge. Pt stated that she is fairly independent with ADl's. Pts PCP is with [facility].     Action/Plan:   Will continue to follow for discharge planning needs. ? need for IV AB at discharge.   Anticipated DC Date:  09/30/2014   Anticipated DC Plan:  [place] [facility]  CM consult      [facility] Choice  HOME HEALTH  DURABLE MEDICAL EQUIPMENT   Choice offered to / List presented to:  C-1 Patient   DME arranged  Vassie Moselle      DME agency  [place] arranged  HH-1 RN  IV Antibiotics  HH-2 PT      [place].   Status of service:  Completed, signed off Medicare Important Message given?   (If response is "NO", the following Medicare IM given date fields will be blank) Date Medicare IM given:   Medicare IM given by:   Date Additional Medicare IM given:   Additional Medicare IM given by:    Discharge Disposition:  [place]  Per UR Regulation:    If discussed at Long Length of Stay Meetings, dates discussed:   10/01/2014  10/03/2014    Comments:  10/03/14 1105 Christinia Gully, RN BSN CM Pt discharged today once returned from [place]. Pt [place] RN arrranged with [place] for IV AB. [place] services to start within 24 hours of discharge. Rolling walker has been delivered to pt by [facility]. [employer] application faxed on 2/87/68 for assistance with Xarelto. They will contact pt with decision. Pt uses [facility] for PCP care. No other CM needs noted. Pt and pts nurse aware of discharge arrangements.  10/02/14  [place], RN BSN CM Pt will need to be on Xarelto at discharge. Pt has no insurance. Pt given card for 1 month free and [employer] application for patient assistance filled out and will fax to [employer] once MD completes his part.  10/01/14 [place], RN BSN CM Pts por-a-cath nonfunctioning as present. Xray to run dye through port to see what the problem could be. If unable to use port for IV AB at home, pt will need PICC. Anticipate discharge within 24 hours once access had been established.   09/27/14 [place], RN BSN CM Pt [place] IV AB to be arranged with [facility] at discharge. If d/c over the weekend, weekend staff will fax orders and call at discharge. Rolling walker to be delivered to pt prior to discharge from [facility]. Pts daughter is a Marine scientist and will administer the IV AB at home. Pt and pts nurse aware of discharge arrangements.  09/26/14 [place], RN BSN CM

## 2014-09-26 NOTE — Evaluation (Signed)
Physical Therapy Evaluation Patient Details Name: Erika Nunez MRN: [id number] DOB: Mar 30, 1951 Today's Date: 09/26/2014   History of Present Illness  Pt reports feeling progressive generalized weakness over the past 2 weeks. Denies fevers, CP, SOB, syncope, dysuria, frequency. Becomes winded w/ exertion. 1 week h/o sacral pain and discharge. H2O2 and duoderm placement w/o benefit.  Very little fluid intake during this time. Pt is currently undergoing chemotherapy for bilateral breast cancer (s/p double mastectomy). Last chemo on 08/14/14. Pt presented to oncology outpt dept today w/ symptoms and was sent to the emergency room due to reported renal failure, weakness and a sacral ulcer.   Clinical Impression  Patient received standing upright with OT and nursing tech present; pt fatigued but pleasant and willing to participate in skilled PT session. Gait approximately 60ft with HHAx2 and intermittent Min(A) for LOB during gait. Patient fatigues very quickly, and denies pain however shows signs of significant pain during her mobility; patient states that she had recently received pain medications. Stand to sit with Min guard, sit to supine with S and extended time. Patient states that her daughter will be able to take leave of absence from work and will be able to assist her 24/7; at this time Pt recommends HHPT with 24/7 assist, and will continue to follow patient during her stay at this facility.     Follow Up Recommendations Home health PT    Equipment Recommendations  Standard walker    Recommendations for Other Services       Precautions / Restrictions Precautions Precautions: Fall Restrictions Weight Bearing Restrictions: No      Mobility  Bed Mobility Overal bed mobility: Modified Independent                Transfers Overall transfer level: Needs assistance Equipment used: None Transfers: Sit to/from Stand Sit to Stand: Min guard         General transfer comment:  Mild instability during transfer  Ambulation/Gait Ambulation/Gait assistance: Min assist;Min guard Ambulation Distance (Feet): 70 Feet Assistive device: 2 person hand held assist Gait Pattern/deviations: Decreased step length - right;Decreased step length - left;Antalgic;Trunk flexed;Narrow base of support   Gait velocity interpretation: Below normal speed for age/gender General Gait Details: Patient did require HHAx2 and was able to ambulate approximately 23ft with mild instability and Min guard, however when patient had LOB she did require  Min(A) to recover balance  Stairs            Wheelchair Mobility    Modified Rankin (Stroke Patients Only)       Balance Overall balance assessment: Needs assistance Sitting-balance support: No upper extremity supported;Feet supported Sitting balance-Leahy Scale: Good     Standing balance support: No upper extremity supported Standing balance-Leahy Scale: Fair                               Pertinent Vitals/Pain Pain Assessment: No/denies pain (Patient denies pain but does show physical signs of pain with mobility; pt reports she had received pain medicine recently)    Home Living Family/patient expects to be discharged to:: Private residence Living Arrangements: Other relatives (2 year old grandson; pt also states her daughter and son have been able to assist more around her home) Available Help at Discharge: Family;Available 24 hours/day (Daughter taking leave of abscence from work to assist patient) Type of Home: House Home Access: Stairs to enter Entrance Stairs-Rails: None Entrance Stairs-Number of Steps: 2  Home Layout: One level Home Equipment: Shower seat;Grab bars - tub/shower;Grab bars - toilet      Prior Function Level of Independence: Independent with assistive device(s)         Comments: Patient states that she has been able to perform tasks around her home, but just needs extended time to do so      Hand Dominance   Dominant Hand: Right    Extremity/Trunk Assessment   Upper Extremity Assessment: Generalized weakness;Overall WFL for tasks assessed;RUE deficits/detail RUE Deficits / Details: $+/5 R shoulder; 4/5 right elbow   RUE Sensation: decreased light touch;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)     Lower Extremity Assessment: Generalized weakness (B legs grossly 4- to 4/5)         Communication   Communication: No difficulties  Cognition Arousal/Alertness: Awake/alert Behavior During Therapy: WFL for tasks assessed/performed Overall Cognitive Status: Within Functional Limits for tasks assessed                      General Comments      Exercises        Assessment/Plan    PT Assessment Patient needs continued PT services  PT Diagnosis Difficulty walking;Abnormality of gait;Generalized weakness;Acute pain   PT Problem List Decreased strength;Decreased coordination;Pain;Decreased activity tolerance;Decreased balance;Decreased mobility;Decreased skin integrity  PT Treatment Interventions Therapeutic exercise;Gait training;Balance training;DME instruction;Stair training;Neuromuscular re-education;Functional mobility training;Therapeutic activities   PT Goals (Current goals can be found in the Care Plan section) Acute Rehab PT Goals Patient Stated Goal: none PT Goal Formulation: With patient Time For Goal Achievement: 10/10/14 Potential to Achieve Goals: Good    Frequency Min 3X/week   Barriers to discharge        Co-evaluation               End of Session   Activity Tolerance: Patient limited by fatigue;Patient limited by pain Patient left: in bed;with call bell/phone within reach;with bed alarm set           Time: 1415-1425 PT Time Calculation (min) (ACUTE ONLY): 10 min   Charges:   PT Evaluation $Initial PT Evaluation Tier I: 1 Procedure     PT G C[place] Oris PT, DPT 09/26/2014, 2:45  PM

## 2014-09-27 LAB — BASIC METABOLIC PANEL
Anion gap: 7 (ref 5–15)
BUN: 25 mg/dL — AB (ref 6–23)
CO2: 25 mmol/L (ref 19–32)
Calcium: 11.7 mg/dL — ABNORMAL HIGH (ref 8.4–10.5)
Chloride: 107 mEq/L (ref 96–112)
Creatinine, Ser: 1.87 mg/dL — ABNORMAL HIGH (ref 0.50–1.10)
GFR, EST AFRICAN AMERICAN: 32 mL/min — AB (ref >90)
GFR, EST NON AFRICAN AMERICAN: 27 mL/min — AB (ref >90)
GLUCOSE: 89 mg/dL (ref 70–99)
POTASSIUM: 4.7 mmol/L (ref 3.5–5.1)
SODIUM: 139 mmol/L (ref 135–145)

## 2014-09-27 LAB — CBC
HCT: 26 % — ABNORMAL LOW (ref 36.0–46.0)
Hemoglobin: 8.3 g/dL — ABNORMAL LOW (ref 12.0–15.0)
MCH: 29.5 pg (ref 26.0–34.0)
MCHC: 31.9 g/dL (ref 30.0–36.0)
MCV: 92.5 fL (ref 78.0–100.0)
Platelets: 308 10*3/uL (ref 150–400)
RBC: 2.81 MIL/uL — ABNORMAL LOW (ref 3.87–5.11)
RDW: 15.2 % (ref 11.5–15.5)
WBC: 8.1 10*3/uL (ref 4.0–10.5)

## 2014-09-27 MED ORDER — FOLIC ACID 1 MG PO TABS
1.0000 mg | ORAL_TABLET | Freq: Every day | ORAL | Status: DC
  Administered 2014-09-27 – 2014-10-03 (×7): 1 mg via ORAL
  Filled 2014-09-27 (×7): qty 1

## 2014-09-27 MED ORDER — VANCOMYCIN HCL IN DEXTROSE 1-5 GM/200ML-% IV SOLN
1000.0000 mg | INTRAVENOUS | Status: DC
  Administered 2014-09-28 – 2014-10-03 (×4): 1000 mg via INTRAVENOUS
  Filled 2014-09-27 (×7): qty 200

## 2014-09-27 NOTE — Progress Notes (Signed)
[facility] PROGRESS NOTE  Erika Nunez IDP:[id number] DOB: 06/03/1951 DOA: 09/25/2014 PCP: [name], MD  Assessment/Plan: 1. 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 Patient will need a prolonged course of intravenous antibiotics of approximately 6 weeks. Continue current treatment for now. She will likely need local wound care since surgical management is not an option. 2. Dehydration. Continue with IV fluids 3. AKI on CKD stage III. Likely related to dehydration. Continue current treatments. Creatinine appears improving 4. Stage IV breast cancer. Discussed with oncology and at present she does not appear to be a candidate for further treatment. Patient may need to be considered for hospice. 5. Chronic diastolic congestive heart failure. Appears compensated. 6. Hypercalcemia, likely related to dehydration. Improving with IV fluids 7. Essential hypertension. Patient was hypotensive on admission. ACE inhibitor and hydrochlorothiazide currently on hold. 8. Chronic pain. Continue fentanyl patch and oxycodone.  Code Status: DO NOT RESUSCITATE Family Communication: Discussed with patient and daughter at the bedside Disposition Plan: Pending hospital course   Consultants:  Oncology  Procedures:    Antibiotics:  Vancomycin 1/13>>  Cefepime 1/13>>  HPI/Subjective: Feeling a little better today. No new complaints  Objective: Filed Vitals:   09/27/14 0703  BP: 102/58  Pulse: 80  Temp: 98.1 F (36.7 C)  Resp:     Intake/Output Summary (Last 24 hours) at 09/27/14 1846 Last data filed at 09/26/14 1900  Gross per 24 hour  Intake    240 ml  Output      0 ml  Net    240 ml   Filed Weights   09/25/14 1221  Weight: 75.297 kg (166 lb)    Exam:   General:  No acute distress  Cardiovascular: S1, S2, regular rate and rhythm  Respiratory: Clear to auscultation bilaterally  Abdomen: Soft, nontender, positive bowel  sounds  Musculoskeletal: Stage IV sacral decubitus ulcer, no significant surrounding erythema. No significant foul smell  Data Reviewed: Basic Metabolic Panel:  Recent Labs Lab 09/25/14 1016 09/25/14 2152 09/26/14 0641 09/27/14 0632  NA 136 135 139 139  K 3.6 3.4* 4.1 4.7  CL 104 104 107 107  CO2 18* 22 24 25   GLUCOSE 168* 86 94 89  BUN 47* 44* 39* 25*  CREATININE 3.43* 3.03* 2.51* 1.87*  CALCIUM 12.3* 11.5* 11.7* 11.7*   Liver Function Tests:  Recent Labs Lab 09/25/14 1016  AST 34  ALT 30  ALKPHOS 119*  BILITOT 0.6  PROT 7.3  ALBUMIN 3.4*   No results for input(s): LIPASE, AMYLASE in the last 168 hours. No results for input(s): AMMONIA in the last 168 hours. CBC:  Recent Labs Lab 09/25/14 1016 09/26/14 0641 09/27/14 0632  WBC 11.8* 7.2 8.1  NEUTROABS 10.3*  --   --   HGB 9.3* 8.4* 8.3*  HCT 28.0* 25.8* 26.0*  MCV 90.3 90.5 92.5  PLT 391 272 308   Cardiac Enzymes: No results for input(s): CKTOTAL, CKMB, CKMBINDEX, TROPONINI in the last 168 hours. BNP (last 3 results) No results for input(s): PROBNP in the last 8760 hours. CBG: No results for input(s): GLUCAP in the last 168 hours.  Recent Results (from the past 240 hour(s))  Culture, blood (routine x 2)     Status: None (Preliminary result)   Collection Time: 09/25/14 10:05 AM  Result Value Ref Range Status   Specimen Description BLOOD LEFT ARM  Final   Special Requests   Final    BOTTLES DRAWN AEROBIC AND ANAEROBIC AEB=8CC  ANA=10CC   Culture NO GROWTH 1 DAY  Final   Report Status PENDING  Incomplete  Culture, blood (routine x 2)     Status: None (Preliminary result)   Collection Time: 09/25/14 10:10 AM  Result Value Ref Range Status   Specimen Description BLOOD LEFT WRIST  Final   Special Requests BOTTLES DRAWN AEROBIC AND ANAEROBIC 6CC  Final   Culture NO GROWTH 1 DAY  Final   Report Status PENDING  Incomplete  Wound culture     Status: None (Preliminary result)   Collection Time: 09/25/14   3:45 PM  Result Value Ref Range Status   Specimen Description WOUND SACRAL  Final   Special Requests NONE  Final   Gram Stain   Final    FEW WBC PRESENT,BOTH PMN AND MONONUCLEAR NO SQUAMOUS EPITHELIAL CELLS SEEN FEW GRAM POSITIVE COCCI IN PAIRS FEW GRAM NEGATIVE RODS Performed at [employer] [facility]   Final    Culture reincubated for better growth Performed at [facility]    Report Status PENDING  Incomplete     Studies: Ct Pelvis Wo Contrast  09/26/2014   CLINICAL DATA:  Midline open wound with layer of adipose tissue exposed measuring 2 cm with white discharge, evaluate for osteomyelitis  EXAM: CT PELVIS WITHOUT CONTRAST  TECHNIQUE: Multidetector CT imaging of the pelvis was performed following the standard protocol without intravenous contrast.  COMPARISON:  09/25/2014 MRI  FINDINGS: There is a decubitus ulcer over the coccyx. Air extends from the dermis all the way down to the coccyx, 2 is subcutaneous depth of 3.1 cm. No appreciable organized fluid collection on this noncontrast study although a small abscess of less than or equal to 2 cm cannot be excluded. The tract of inflammation measures a maximum diameter of about 17 mm in width. The posterior cortex of the coccyx at this level is difficult to ascertain and therefore osteomyelitis is suspected.  Bladder and pelvic organs appear unremarkable. There is calcification of the aorta. Appendix is normal. No free fluid in the visualized portion of the pelvis.  IMPRESSION: The findings are consistent with decubitus ulcer and are concerning for small focus of osteomyelitis involving the mid coccyx.   Electronically Signed   By: Skipper Cliche M.D.   On: 09/26/2014 10:12   Mr Pelvis Wo Contrast  09/25/2014   CLINICAL DATA:  Open wound with drainage over the coccyx. Evaluate for osteomyelitis. History of breast and brain cancer.  EXAM: MRI PELVIS WITHOUT CONTRAST  TECHNIQUE: Multiplanar multisequence MR imaging of the  pelvis was performed. No intravenous contrast was administered.  COMPARISON:  PET-CT 04/01/2014.  FINDINGS: Examination is mildly degraded by motion. No intravenous contrast was administered.  There is deep soft tissue ulceration inferiorly over the inguinal crease, extending superiorly around the coccyx. There is an ill-defined approximately 3.8 x 3.7 cm focus of inflammation in this area with central low-density which probably reflects gas. Although limited by motion, the perirectal fat planes are preserved. There is no definite perirectal/perianal fistula. No intrapelvic inflammatory changes or fluid collections are seen.  Best seen on the sagittal images is T2 hyperintensity within the coccyx worrisome for early osteomyelitis. The sacrum and remainder of the visualized bony pelvis appear intact. There is no significant hip joint effusion. Both femoral heads appear normal.  Uterine fibroids are noted.  There is no adnexal mass.  IMPRESSION: 1. Decubitus ulceration over the coccyx with underlying inflammation but no well-defined abscess. No definite intrapelvic extension  or associated perirectal/perianal fistula. 2. Abnormal T2 signal in the distal coccyx worrisome for osteomyelitis. 3. Given the motion limitations of this examination, CT may be helpful for further evaluation (and better tolerated by this patient).   Electronically Signed   By: Camie Patience M.D.   On: 09/25/2014 19:47    Scheduled Meds: . ceFEPime (MAXIPIME) IV  1 g Intravenous Q24H  . feeding supplement  237 mL Oral TID BM  . fentaNYL  75 mcg Transdermal Q72H  . folic acid  1 mg Oral Daily  . gabapentin  300 mg Oral BID  . heparin  5,000 Units Subcutaneous 3 times per day  . potassium chloride SA  40 mEq Oral TID  . pravastatin  80 mg Oral QHS  . senna  1 tablet Oral BID  . sodium chloride  3 mL Intravenous Q12H  . [START ON 09/28/2014] vancomycin  1,000 mg Intravenous Q24H   Continuous Infusions:   Principal Problem:   Acute  renal failure superimposed on stage 3 chronic kidney disease Active Problems:   Breast cancer   Hypertension   Metastasis from breast cancer   Hypercalcemia   Physical deconditioning   Stage IV pressure ulcer of sacral region   Chronic diastolic CHF (congestive heart failure)   Constipation   HLD (hyperlipidemia)   Protein-calorie malnutrition, severe    Time spent: 25mins    Cloyd Ragas  [facility] Pager [phone]. If 7PM-7AM, please contact night-coverage at [email or web address], password [facility] 09/27/2014, 6:46 PM  LOS: 2 days

## 2014-09-27 NOTE — Progress Notes (Signed)
Subjective: Patient is asleep in bed.  Daughter is at the bedside.  I spoke with the daughter.  She is not sure how much time the patient is spending at home in bed or in a chair, but she suspects it is more than Erika Nunez is reporting.  Erika Nunez reports a 1 week history of spending most of her time in a chair or bed, but a Stage IV decubitus ulcer does not typically develop over 1 week.  I educated the patient's daughter about this and she understands this information.  Therefore, I suspect she has spent weeks in a recumbent position for extended periods of time.  The patient's daughter notes that the patient has declined significantly since her diagnosis of right chest wall involvement.  I agree with this statement.    The patient started kadcyla in August 2015 without significant improvement in her clinical malignancy burden.  Therefore, I am concerned about unresponsive disease and I am concerned about her prognosis.  I told this to the patient's daughter while the patient slept.    PT has evaluated the patient.  Patient's daughter reports that she was up walking with them.  Objective: Vital signs in last 24 hours: Temp:  [98.1 F (36.7 C)] 98.1 F (36.7 C) (01/15 0703) Pulse Rate:  [80] 80 (01/15 0703) BP: (102)/(58) 102/58 mmHg (01/15 0703) SpO2:  [100 %] 100 % (01/15 0703)  Intake/Output from previous day: 01/14 0800 - 01/15 0759 In: 720 [P.O.:720] Out: -  Intake/Output this shift:    General appearance: asleep  Lab Results:   Recent Labs  09/26/14 0641 09/27/14 0632  WBC 7.2 8.1  HGB 8.4* 8.3*  HCT 25.8* 26.0*  PLT 272 308   BMET  Recent Labs  09/26/14 0641 09/27/14 0632  NA 139 139  K 4.1 4.7  CL 107 107  CO2 24 25  GLUCOSE 94 89  BUN 39* 25*  CREATININE 2.51* 1.87*  CALCIUM 11.7* 11.7*    Studies/Results: Ct Pelvis Wo Contrast  09/26/2014   CLINICAL DATA:  Midline open wound with layer of adipose tissue exposed measuring 2 cm with white discharge, evaluate  for osteomyelitis  EXAM: CT PELVIS WITHOUT CONTRAST  TECHNIQUE: Multidetector CT imaging of the pelvis was performed following the standard protocol without intravenous contrast.  COMPARISON:  09/25/2014 MRI  FINDINGS: There is a decubitus ulcer over the coccyx. Air extends from the dermis all the way down to the coccyx, 2 is subcutaneous depth of 3.1 cm. No appreciable organized fluid collection on this noncontrast study although a small abscess of less than or equal to 2 cm cannot be excluded. The tract of inflammation measures a maximum diameter of about 17 mm in width. The posterior cortex of the coccyx at this level is difficult to ascertain and therefore osteomyelitis is suspected.  Bladder and pelvic organs appear unremarkable. There is calcification of the aorta. Appendix is normal. No free fluid in the visualized portion of the pelvis.  IMPRESSION: The findings are consistent with decubitus ulcer and are concerning for small focus of osteomyelitis involving the mid coccyx.   Electronically Signed   By: Skipper Cliche M.D.   On: 09/26/2014 10:12   Mr Pelvis Wo Contrast  09/25/2014   CLINICAL DATA:  Open wound with drainage over the coccyx. Evaluate for osteomyelitis. History of breast and brain cancer.  EXAM: MRI PELVIS WITHOUT CONTRAST  TECHNIQUE: Multiplanar multisequence MR imaging of the pelvis was performed. No intravenous contrast was administered.  COMPARISON:  PET-CT  04/01/2014.  FINDINGS: Examination is mildly degraded by motion. No intravenous contrast was administered.  There is deep soft tissue ulceration inferiorly over the inguinal crease, extending superiorly around the coccyx. There is an ill-defined approximately 3.8 x 3.7 cm focus of inflammation in this area with central low-density which probably reflects gas. Although limited by motion, the perirectal fat planes are preserved. There is no definite perirectal/perianal fistula. No intrapelvic inflammatory changes or fluid collections  are seen.  Best seen on the sagittal images is T2 hyperintensity within the coccyx worrisome for early osteomyelitis. The sacrum and remainder of the visualized bony pelvis appear intact. There is no significant hip joint effusion. Both femoral heads appear normal.  Uterine fibroids are noted.  There is no adnexal mass.  IMPRESSION: 1. Decubitus ulceration over the coccyx with underlying inflammation but no well-defined abscess. No definite intrapelvic extension or associated perirectal/perianal fistula. 2. Abnormal T2 signal in the distal coccyx worrisome for osteomyelitis. 3. Given the motion limitations of this examination, CT may be helpful for further evaluation (and better tolerated by this patient).   Electronically Signed   By: Camie Patience M.D.   On: 09/25/2014 19:47    Medications: I have reviewed the patient's current medications.  Assessment/Plan: 1. Acute on chronic renal disease, improving, near baseline. 2. Hypercalcemia, secondary to dehydration, improved.  Corrected calcium based off of albumin from 09/25/2014 is 12.2. 3. 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 Leukocytosis, resolved with antibiotics, secondary to #3. 5. Normochromic, normocytic anemia. 6. Stage IV Breast Cancer, last treated on 08/14/2014 7. DNR 8. I spent time with the patient's daughter, since Erika Nunez was asleep, preparing her for discussions regarding to end-of-life.  Her chest wall malignancy is not improving despite 5 months worth of treatment.  Her performance status is clearly a 3 in light of development of a Stage IV decubitus ulcer.  The patient's daughter understands.  I did not discuss Hospice to date.  She is appropriate for Hospice at any time. Patient was asleep and I want to discuss it with her in person.    Breast cancer   10/02/2012 Initial Diagnosis Breast cancer, Her2+, ER-, PR-   10/23/2012 - 02/12/2013 Chemotherapy TCH x 6 cycles   03/05/2013 - 11/05/2013 Chemotherapy  Herceptin/Perjeta   06/04/2013 - 04/24/2014 Chemotherapy Arimidex daily   02/20/2014 Relapse/Recurrence CT head- New mass lesion involving the right cerebellar hemisphere with associated vasogenic edema   02/25/2014 Definitive Surgery 1. Suboccipital craniectomy 2. Resection of tumor   3. Use of intraoperative microscope for microdissection    02/25/2014 Pathology Results 1. Brain, for tumor resection, Right cerebellar- metastatic carcinoma 2. Brain, for tumor resection, Right cerebellar- metastatic carcinoma. 3. Brain, for tumor resection, Right cerebellar- metastatic carcinoma.  HER2-. ER/PR 0%, Ki-67 92%.   04/01/2014 Relapse/Recurrence PET- Marked hypermetabolism associated with the abnormal and irregular thickening of the skin over the mid and lower right lateral and post for lateral chest wall.   04/15/2014 Procedure Soft tissue mass, biopsy, right chest wall by Dr. Marlou Starks showing invasive carcinoma with LVI.  HER2+, ER 2%, PR 0%, Ki-67 87%.   05/01/2014 - 08/14/2014 Chemotherapy Kadcyla 3.6 mg/kg every 21 days   09/25/2014 [facility] Admission Acute on chronic renal failure, Stage IV decubitus ulcer of sacrum with osteomyelitis.    Adjuvant Chemotherapy       LOS: 2 days    KEFALAS,THOMAS 09/27/2014 Pager: [phone]   As above. Patient is essentially bed bound although  she denies. Daughter will most likely be staying with her post discharge.  Will continue to slowly address end of life issues with the patient.  Continue treatment as above. Donald Pore MD

## 2014-09-27 NOTE — Progress Notes (Signed)
ANTIBIOTIC CONSULT NOTE - follow up  Pharmacy Consult for Vancomycin and Cefepime Indication: osteomyelitis  Allergies  Allergen Reactions  . Codeine Hives  . Famvir [Famciclovir]     Patient had edema from knees down while taking. When she stopped taking drug, the edema went away.   Patient Measurements: Height: 5\' 7"  (170.2 cm) Weight: 166 lb (75.297 kg) IBW/kg (Calculated) : 61.6  Vital Signs: Temp: 98.1 F (36.7 C) (01/15 0703) Temp Source: Oral (01/15 0703) BP: 102/58 mmHg (01/15 0703) Pulse Rate: 80 (01/15 0703) Intake/Output from previous day: 01/14 0701 - 01/15 0700 In: 720 [P.O.:720] Out: -  Intake/Output from this shift:    Labs:  Recent Labs  09/25/14 1016 09/25/14 2152 09/26/14 0641 09/27/14 0632  WBC 11.8*  --  7.2 8.1  HGB 9.3*  --  8.4* 8.3*  PLT 391  --  272 308  CREATININE 3.43* 3.03* 2.51* 1.87*   Estimated Creatinine Clearance: 32.2 mL/min (by C-G formula based on Cr of 1.87). No results for input(s): VANCOTROUGH, VANCOPEAK, VANCORANDOM, GENTTROUGH, GENTPEAK, GENTRANDOM, TOBRATROUGH, TOBRAPEAK, TOBRARND, AMIKACINPEAK, AMIKACINTROU, AMIKACIN in the last 72 hours.   Microbiology: Recent Results (from the past 720 hour(s))  Culture, blood (routine x 2)     Status: None (Preliminary result)   Collection Time: 09/25/14 10:05 AM  Result Value Ref Range Status   Specimen Description BLOOD LEFT ARM  Final   Special Requests   Final    BOTTLES DRAWN AEROBIC AND ANAEROBIC AEB=8CC ANA=10CC   Culture NO GROWTH 1 DAY  Final   Report Status PENDING  Incomplete  Culture, blood (routine x 2)     Status: None (Preliminary result)   Collection Time: 09/25/14 10:10 AM  Result Value Ref Range Status   Specimen Description BLOOD LEFT WRIST  Final   Special Requests BOTTLES DRAWN AEROBIC AND ANAEROBIC 6CC  Final   Culture NO GROWTH 1 DAY  Final   Report Status PENDING  Incomplete  Wound culture     Status: None (Preliminary result)   Collection Time:  09/25/14  3:45 PM  Result Value Ref Range Status   Specimen Description WOUND SACRAL  Final   Special Requests NONE  Final   Gram Stain   Final    FEW WBC PRESENT,BOTH PMN AND MONONUCLEAR NO SQUAMOUS EPITHELIAL CELLS SEEN FEW GRAM POSITIVE COCCI IN PAIRS FEW GRAM NEGATIVE RODS Performed at [facility]    Culture   Final    Culture reincubated for better growth Performed at [facility]    Report Status PENDING  Incomplete   Medical History: Past Medical History  Diagnosis Date  . Hypertension   . High cholesterol   . Cancer   . Breast cancer   . Allergy     seasonal allergies  . Arthritis   . Anxiety   . Breast mass, left 2014  . Breast mass, right 2014  . 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 Depression   . Lymphedema of arm     right arm   Anti-infectives    Start     Dose/Rate Route Frequency Ordered Stop   09/28/14 0600  vancomycin (VANCOCIN) IVPB 1000 mg/200 mL premix     1,000 mg200 mL/hr over 60 Minutes Intravenous Every 24 hours 09/27/14 1231     09/27/14 0600  vancomycin (VANCOCIN) IVPB 1000 mg/200 mL premix  Status:  Discontinued     1,000 mg200 mL/hr over 60 Minutes Intravenous Every 48 hours 09/25/14 1652 09/27/14 1231  09/26/14 1800  ceFEPIme (MAXIPIME) 1 g in dextrose 5 % 50 mL IVPB     1 g100 mL/hr over 30 Minutes Intravenous Every 24 hours 09/25/14 1653     09/25/14 1730  vancomycin (VANCOCIN) 1,500 mg in sodium chloride 0.9 % 500 mL IVPB     1,500 mg250 mL/hr over 120 Minutes Intravenous  Once 09/25/14 1641 09/25/14 1930   09/25/14 1645  ceFEPIme (MAXIPIME) 2 g in dextrose 5 % 50 mL IVPB     2 g100 mL/hr over 30 Minutes Intravenous  Once 09/25/14 1639 09/25/14 2115     Assessment: 64yo female with progressive weakness and h/o sacral pain and discharge.  Currently undergoing chemo for breast cancer.  SCr elevated on admission but has improved with hydration.  Asked to initiate Vancomycin and Cefepime for  possible osteo.  Estimated Creatinine Clearance: 32.2 mL/min (by C-G formula based on Cr of 1.87).  Goal of Therapy:  Vancomycin trough level 15-20 mcg/ml Eradicate infection.  Plan:  Cefepime 1gm IV q24hrs Empirically increase Vancomycin to 1gm IV q24hrs Check trough at steady state Monitor labs, renal fxn, and cultures  Hart Robinsons A 09/27/2014,12:31 PM

## 2014-09-27 NOTE — Progress Notes (Signed)
Physical Therapy Treatment Patient Details Name: Erika Nunez MRN: [id number] DOB: 01-10-1951 Today's Date: 09/27/2014    History of Present Illness Pt reports feeling progressive generalized weakness over the past 2 weeks. Denies fevers, CP, SOB, syncope, dysuria, frequency. Becomes winded w/ exertion. 1 week h/o sacral pain and discharge. H2O2 and duoderm placement w/o benefit.  Very little fluid intake during this time. Pt is currently undergoing chemotherapy for bilateral breast cancer (s/p double mastectomy). Last chemo on 08/14/14. Pt presented to oncology outpt dept today w/ symptoms and was sent to the emergency room due to reported renal failure, weakness and a sacral ulcer.     PT Comments    Pt agreeable to treatment today, though reports increased pain due to stiffness in low back and from decubitus ulcer in buttock.   Initiated treatment session with supine exericses to decrease lower body stiffness.  Ambulated attempted with RW today (as evaluating PT order walker for home use); use of RW as this was all that was available.  Noted great improvements in gait distance today, with VC on occasion due to scuffing Lt foot on ground and VC for increasing foot clearance.  Family does report this happened prior to hospitalization sometimes. After gait, pt was able to tolerate exercises in sitting on EOB for strengthening to increase foot clearance during gait prior to return to bed.  No complains of pain with exercises or gait, though facial grimacing and slow movements noted during transfer supine <-> sit secondary to pain in back/buttock.  D/C recommendations still appropriate.     Follow Up Recommendations  Home health PT     Equipment Recommendations  Standard walker;Rolling walker with 5" wheels (Std vs. RW per pt preference (practiced with RW today as that is what was available for use))       Precautions / Restrictions Precautions Precautions: Fall Restrictions Weight Bearing  Restrictions: No    Mobility  Bed Mobility Overal bed mobility: Modified Independent                Transfers Overall transfer level: Needs assistance Equipment used: Rolling walker (2 wheeled) Transfers: Sit to/from [facility] Sit to Stand: Supervision Stand pivot transfers: Supervision          Ambulation/Gait Ambulation/Gait assistance: Supervision;Min guard Ambulation Distance (Feet): 450 Feet Assistive device: Rolling walker (2 wheeled) Gait Pattern/deviations: Step-through pattern;Decreased dorsiflexion - left   Gait velocity interpretation: Below normal speed for age/gender General Gait Details: Noted some scuffing on lt LE as pt had intermittent difficulty clearing Lt foot; VC for increased foot clearance required.          Cognition Arousal/Alertness: Awake/alert Behavior During Therapy: WFL for tasks assessed/performed Overall Cognitive Status: Within Functional Limits for tasks assessed                      Exercises General Exercises - Lower Extremity Ankle Circles/Pumps: AROM;Both;10 reps;Supine Heel Slides: AROM;Both;10 reps;Supine Hip ABduction/ADduction: AROM;Both;10 reps;Supine Hip Flexion/Marching: AROM;Both;10 reps;Seated Toe Raises: AROM;Both;10 reps;Seated        Pertinent Vitals/Pain Pain Assessment: 0-10 Pain Score: 9  Pain Location: Low back and buttock "stiffness" Pain Descriptors / Indicators: Sore Pain Intervention(s): Limited activity within patient's tolerance;Monitored during session;Premedicated before session;Repositioned           PT Goals (current goals can now be found in the care plan section) Progress towards PT goals: Progressing toward goals    Frequency  Min 3X/week    PT Plan  Current plan remains appropriate       End of Session Equipment Utilized During Treatment: Gait belt Activity Tolerance: Patient tolerated treatment well Patient left: in bed;with call bell/phone within  reach;with bed alarm set;with family/visitor present     Time: 9295-7473 PT Time Calculation (min) (ACUTE ONLY): 29 min  Charges:  $Gait Training: 8-22 mins $Therapeutic Exercise: 8-22 mins                    Lonna Cobb, DPT [phone]  09/27/2014, 10:08 AM

## 2014-09-28 DIAGNOSIS — E43 Unspecified severe protein-calorie malnutrition: Secondary | ICD-10-CM

## 2014-09-28 LAB — COMPREHENSIVE METABOLIC PANEL
ALT: 22 U/L (ref 0–35)
AST: 29 U/L (ref 0–37)
Albumin: 3 g/dL — ABNORMAL LOW (ref 3.5–5.2)
Alkaline Phosphatase: 87 U/L (ref 39–117)
Anion gap: 8 (ref 5–15)
BILIRUBIN TOTAL: 0.6 mg/dL (ref 0.3–1.2)
BUN: 20 mg/dL (ref 6–23)
CHLORIDE: 105 meq/L (ref 96–112)
CO2: 22 mmol/L (ref 19–32)
Calcium: 11.3 mg/dL — ABNORMAL HIGH (ref 8.4–10.5)
Creatinine, Ser: 1.7 mg/dL — ABNORMAL HIGH (ref 0.50–1.10)
GFR, EST AFRICAN AMERICAN: 36 mL/min — AB (ref >90)
GFR, EST NON AFRICAN AMERICAN: 31 mL/min — AB (ref >90)
GLUCOSE: 82 mg/dL (ref 70–99)
POTASSIUM: 4.5 mmol/L (ref 3.5–5.1)
Sodium: 135 mmol/L (ref 135–145)
Total Protein: 6.5 g/dL (ref 6.0–8.3)

## 2014-09-28 LAB — SEDIMENTATION RATE: Sed Rate: 140 mm/hr — ABNORMAL HIGH (ref 0–22)

## 2014-09-28 MED ORDER — METRONIDAZOLE 500 MG PO TABS
500.0000 mg | ORAL_TABLET | Freq: Three times a day (TID) | ORAL | Status: DC
  Administered 2014-09-28 – 2014-10-03 (×15): 500 mg via ORAL
  Filled 2014-09-28 (×15): qty 1

## 2014-09-28 MED ORDER — CEFTRIAXONE SODIUM IN DEXTROSE 20 MG/ML IV SOLN
1.0000 g | INTRAVENOUS | Status: DC
  Administered 2014-09-28 – 2014-10-03 (×6): 1 g via INTRAVENOUS
  Filled 2014-09-28 (×8): qty 50

## 2014-09-28 MED ORDER — DEXTROSE 5 % IV SOLN
INTRAVENOUS | Status: AC
  Filled 2014-09-28: qty 10

## 2014-09-28 NOTE — Progress Notes (Signed)
[facility] PROGRESS NOTE  Erika Nunez IRC:[id number] DOB: 03/14/1951 DOA: 09/25/2014 PCP: [name], MD  Assessment/Plan: 1. 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 Patient will need a prolonged course of intravenous antibiotics of approximately 6 weeks. Case discussed with infectious disease, Dr. Tommy Medal. Recommendations are for vancomycin, Rocephin and Flagyl for a total of 6 weeks. Patient was also seen by wound care recommendations made for local treatment. Continue to follow. 2. Dehydration. Continue with IV fluids 3. AKI on CKD stage III. Likely related to dehydration. Continue current treatments. Creatinine appears improving 4. Stage IV breast cancer. Discussed with oncology and at present she does not appear to be a candidate for further treatment. We'll discuss further goals of care. Oncology is considering hospice referral, but they will discuss this further with the patient. 5. Chronic diastolic congestive heart failure. Appears compensated. 6. Hypercalcemia, likely related to dehydration. Improving with IV fluids 7. Essential hypertension. Patient was hypotensive on admission. ACE inhibitor and hydrochlorothiazide currently on hold. 8. Chronic pain. Continue fentanyl patch and oxycodone.  Code Status: DO NOT RESUSCITATE Family Communication: Discussed with patient and daughter at the bedside Disposition Plan: Pending hospital course   Consultants:  Oncology  Wound care  Procedures:    Antibiotics:  Vancomycin 1/13>>  Cefepime 1/13>>1/16  Ceftriaxone 1/16>>  Flagyl 1/16>>  HPI/Subjective: No shortness of breath, notes drainage and pain coming from sacral wound  Objective: Filed Vitals:   09/28/14 1518  BP: 94/57  Pulse: 83  Temp: 98.6 F (37 C)  Resp: 18    Intake/Output Summary (Last 24 hours) at 09/28/14 1524 Last data filed at 09/28/14 0800  Gross per 24 hour  Intake    120 ml  Output       0 ml  Net    120 ml   Filed Weights   09/25/14 1221  Weight: 75.297 kg (166 lb)    Exam:   General:  No acute distress  Cardiovascular: S1, S2, regular rate and rhythm  Respiratory: clear to Auscultation bilaterally  Abdomen: Soft, nontender, positive bowel sounds  Musculoskeletal: Stage IV sacral decubitus ulcer, no significant surrounding erythema. Seropurulent discharge with foul smell  Data Reviewed: Basic Metabolic Panel:  Recent Labs Lab 09/25/14 1016 09/25/14 2152 09/26/14 0641 09/27/14 0632 09/28/14 0555  NA 136 135 139 139 135  K 3.6 3.4* 4.1 4.7 4.5  CL 104 104 107 107 105  CO2 18* 22 24 25 22   GLUCOSE 168* 86 94 89 82  BUN 47* 44* 39* 25* 20  CREATININE 3.43* 3.03* 2.51* 1.87* 1.70*  CALCIUM 12.3* 11.5* 11.7* 11.7* 11.3*   Liver Function Tests:  Recent Labs Lab 09/25/14 1016 09/28/14 0555  AST 34 29  ALT 30 22  ALKPHOS 119* 87  BILITOT 0.6 0.6  PROT 7.3 6.5  ALBUMIN 3.4* 3.0*   No results for input(s): LIPASE, AMYLASE in the last 168 hours. No results for input(s): AMMONIA in the last 168 hours. CBC:  Recent Labs Lab 09/25/14 1016 09/26/14 0641 09/27/14 0632  WBC 11.8* 7.2 8.1  NEUTROABS 10.3*  --   --   HGB 9.3* 8.4* 8.3*  HCT 28.0* 25.8* 26.0*  MCV 90.3 90.5 92.5  PLT 391 272 308   Cardiac Enzymes: No results for input(s): CKTOTAL, CKMB, CKMBINDEX, TROPONINI in the last 168 hours. BNP (last 3 results) No results for input(s): PROBNP in the last 8760 hours. CBG: No results for input(s): GLUCAP in the last 168  hours.  Recent Results (from the past 240 hour(s))  Culture, blood (routine x 2)     Status: None (Preliminary result)   Collection Time: 09/25/14 10:05 AM  Result Value Ref Range Status   Specimen Description BLOOD LEFT ARM  Final   Special Requests   Final    BOTTLES DRAWN AEROBIC AND ANAEROBIC AEB=8CC ANA=10CC   Culture NO GROWTH 3 DAYS  Final   Report Status PENDING  Incomplete  Culture, blood (routine x 2)      Status: None (Preliminary result)   Collection Time: 09/25/14 10:10 AM  Result Value Ref Range Status   Specimen Description BLOOD LEFT WRIST  Final   Special Requests BOTTLES DRAWN AEROBIC AND ANAEROBIC 6CC  Final   Culture NO GROWTH 3 DAYS  Final   Report Status PENDING  Incomplete  Wound culture     Status: None (Preliminary result)   Collection Time: 09/25/14  3:45 PM  Result Value Ref Range Status   Specimen Description WOUND SACRAL  Final   Special Requests NONE  Final   Gram Stain   Final    FEW WBC PRESENT,BOTH PMN AND MONONUCLEAR NO SQUAMOUS EPITHELIAL CELLS SEEN FEW GRAM POSITIVE COCCI IN PAIRS FEW GRAM NEGATIVE RODS Performed at [facility]    Culture   Final    FEW STAPHYLOCOCCUS AUREUS Note: RIFAMPIN AND GENTAMICIN SHOULD NOT BE USED AS SINGLE DRUGS FOR TREATMENT OF STAPH INFECTIONS. Performed at [facility]    Report Status PENDING  Incomplete     Studies: No results found.  Scheduled Meds: . cefTRIAXone (ROCEPHIN)  IV  1 g Intravenous Q24H  . feeding supplement  237 mL Oral TID BM  . fentaNYL  75 mcg Transdermal Q72H  . folic acid  1 mg Oral Daily  . gabapentin  300 mg Oral BID  . heparin  5,000 Units Subcutaneous 3 times per day  . metroNIDAZOLE  500 mg Oral 3 times per day  . potassium chloride SA  40 mEq Oral TID  . pravastatin  80 mg Oral QHS  . senna  1 tablet Oral BID  . sodium chloride  3 mL Intravenous Q12H  . 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 If 7PM-7AM, please contact night-coverage at [email or web address], password [facility] 09/28/2014, 3:24 PM  LOS: 3 days

## 2014-09-28 NOTE — Consult Note (Signed)
WOC wound consult note Reason for Consult:Neoplastic lesions at right breast and chest, Stage IV PrU at Coccyx with osteomyelitis.  Patient and family considering Palliative Care consult. Patient is complaining of pain in this area impacting quality of life.  Son in room at time of assessment. Wound type:Neoplasm and pressure Pressure Ulcer POA: Yes Measurement:  RIght breast and chest:  20cm x 30cm area with numerous open areas (the largest of which measures 1cm x 0.8cm x 0.2cm and also many raised areas. Moist, slightly exudative which causes clothing adherence to skin and subsequent trauma upon removal.  Coccyx PrU measures 2cm x 1cm x 2.5cm with a moist red base and 10% yellow slough at edge. Small amount of serous drainage. No odor. Wound bed:As described above. Drainage (amount, consistency, odor) As described above. Periwound:Intact Dressing procedure/placement/frequency: I will today provide a pressure redistribution chair cushion to facilitate patient being OOB more as well as topical wound care to fill the dead space.  It has been documented that calcium alginate have some impact on pain and I am hopeful for this response in this patient.  Of course, with the diagnosis of osteomyelitis, patient will either require intensive antibiotic therapy as directed by medicine, or understand the likelihood of chronicity for this ulceration.We will cover the Ca+ alginate with a soft silicone foam dressing.  For the fungating breast and right chest lesions, I will add a daily cleanse with our deodorizing and conditioning no rinse bathing solution and follow that with a covering of our antimicrobial textile that will prevent adherence to clothing.  This can be taped in place and reused for up to 5 days (removing daily for cleansing of area). Patient and son express relief and gratefulness following consultation.   Thank you for consulting with [name] on this lovely patient. [place] nursing team will not follow, but  will remain available to this patient, the nursing and medical teams.  Please re-consult if needed. Thanks, Maudie Flakes, MSN, RN, [place], [place], [place] [phone])

## 2014-09-29 LAB — CBC
HCT: 25.7 % — ABNORMAL LOW (ref 36.0–46.0)
Hemoglobin: 8.2 g/dL — ABNORMAL LOW (ref 12.0–15.0)
MCH: 29.3 pg (ref 26.0–34.0)
MCHC: 31.9 g/dL (ref 30.0–36.0)
MCV: 91.8 fL (ref 78.0–100.0)
Platelets: 306 10*3/uL (ref 150–400)
RBC: 2.8 MIL/uL — ABNORMAL LOW (ref 3.87–5.11)
RDW: 14.9 % (ref 11.5–15.5)
WBC: 7.5 10*3/uL (ref 4.0–10.5)

## 2014-09-29 LAB — WOUND CULTURE

## 2014-09-29 LAB — BASIC METABOLIC PANEL
Anion gap: 5 (ref 5–15)
BUN: 18 mg/dL (ref 6–23)
CHLORIDE: 106 meq/L (ref 96–112)
CO2: 23 mmol/L (ref 19–32)
Calcium: 11.8 mg/dL — ABNORMAL HIGH (ref 8.4–10.5)
Creatinine, Ser: 1.5 mg/dL — ABNORMAL HIGH (ref 0.50–1.10)
GFR calc non Af Amer: 36 mL/min — ABNORMAL LOW (ref >90)
GFR, EST AFRICAN AMERICAN: 41 mL/min — AB (ref >90)
Glucose, Bld: 100 mg/dL — ABNORMAL HIGH (ref 70–99)
Potassium: 4.6 mmol/L (ref 3.5–5.1)
SODIUM: 134 mmol/L — AB (ref 135–145)

## 2014-09-29 MED ORDER — ALTEPLASE 2 MG IJ SOLR
2.0000 mg | Freq: Once | INTRAMUSCULAR | Status: DC
  Filled 2014-09-29: qty 2

## 2014-09-29 NOTE — Progress Notes (Signed)
[facility] PROGRESS NOTE  Erika Nunez KZS:[id number] DOB: November 18, 1950 DOA: 09/25/2014 PCP: [name], MD  Assessment/Plan: 1. 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 Patient will need a prolonged course of intravenous antibiotics of approximately 6 weeks. Case discussed with infectious disease, Dr. Tommy Medal. Recommendations are for vancomycin, Rocephin and Flagyl for a total of 6 weeks. Patient was also seen by wound care recommendations made for local treatment. Continue to follow. 2. Dehydration. Continue with IV fluids 3. AKI on CKD stage III. Likely related to dehydration. Continue current treatments. Creatinine appears improving and is approaching baseline 4. Stage IV breast cancer. Discussed with oncology and at present she does not appear to be a candidate for further treatment. We'll discuss further goals of care. Oncology is considering hospice referral, but they will discuss this further with the patient. Will await further recommendations from them tomorrow. 5. Chronic diastolic congestive heart failure. Appears compensated. 6. Hypercalcemia, likely related to dehydration. Improving with IV fluids 7. Essential hypertension. Patient was hypotensive on admission. ACE inhibitor and hydrochlorothiazide currently on hold. 8. Chronic pain. Continue fentanyl patch and oxycodone.  Code Status: DO NOT RESUSCITATE Family Communication: Discussed with patient and son at the bedside Disposition Plan: probable discharge home tomorrow   Consultants:  Oncology  Wound care  Procedures:    Antibiotics:  Vancomycin 1/13>>  Cefepime 1/13>>1/16  Ceftriaxone 1/16>>  Flagyl 1/16>>  HPI/Subjective: Feeling better today. Feels that wound is less painful after packing in wound applied. Patient noted to be hypotensive this morning  Objective: Filed Vitals:   09/29/14 0641  BP: 87/47  Pulse: 73  Temp: 98.7 F (37.1 C)  Resp: 18     Intake/Output Summary (Last 24 hours) at 09/29/14 1110 Last data filed at 09/29/14 0900  Gross per 24 hour  Intake    240 ml  Output      0 ml  Net    240 ml   Filed Weights   09/25/14 1221  Weight: 75.297 kg (166 lb)    Exam:   General:  No acute distress  Cardiovascular: S1, S2, regular rate and rhythm  Respiratory: clear to Auscultation bilaterally  Abdomen: Soft, nontender, positive bowel sounds  Musculoskeletal: Stage IV sacral decubitus ulcer, no significant surrounding erythema. Seropurulent discharge with foul smell  Data Reviewed: Basic Metabolic Panel:  Recent Labs Lab 09/25/14 2152 09/26/14 0641 09/27/14 0632 09/28/14 0555 09/29/14 0848  NA 135 139 139 135 134*  K 3.4* 4.1 4.7 4.5 4.6  CL 104 107 107 105 106  CO2 22 24 25 22 23   GLUCOSE 86 94 89 82 100*  BUN 44* 39* 25* 20 18  CREATININE 3.03* 2.51* 1.87* 1.70* 1.50*  CALCIUM 11.5* 11.7* 11.7* 11.3* 11.8*   Liver Function Tests:  Recent Labs Lab 09/25/14 1016 09/28/14 0555  AST 34 29  ALT 30 22  ALKPHOS 119* 87  BILITOT 0.6 0.6  PROT 7.3 6.5  ALBUMIN 3.4* 3.0*   No results for input(s): LIPASE, AMYLASE in the last 168 hours. No results for input(s): AMMONIA in the last 168 hours. CBC:  Recent Labs Lab 09/25/14 1016 09/26/14 0641 09/27/14 0632 09/29/14 0848  WBC 11.8* 7.2 8.1 7.5  NEUTROABS 10.3*  --   --   --   HGB 9.3* 8.4* 8.3* 8.2*  HCT 28.0* 25.8* 26.0* 25.7*  MCV 90.3 90.5 92.5 91.8  PLT 391 272 308 306   Cardiac Enzymes: No results for input(s): CKTOTAL, CKMB,  CKMBINDEX, TROPONINI in the last 168 hours. BNP (last 3 results) No results for input(s): PROBNP in the last 8760 hours. CBG: No results for input(s): GLUCAP in the last 168 hours.  Recent Results (from the past 240 hour(s))  Culture, blood (routine x 2)     Status: None (Preliminary result)   Collection Time: 09/25/14 10:05 AM  Result Value Ref Range Status   Specimen Description BLOOD LEFT ARM  Final    Special Requests   Final    BOTTLES DRAWN AEROBIC AND ANAEROBIC AEB=8CC ANA=10CC   Culture NO GROWTH 4 DAYS  Final   Report Status PENDING  Incomplete  Culture, blood (routine x 2)     Status: None (Preliminary result)   Collection Time: 09/25/14 10:10 AM  Result Value Ref Range Status   Specimen Description BLOOD LEFT WRIST  Final   Special Requests BOTTLES DRAWN AEROBIC AND ANAEROBIC 6CC  Final   Culture NO GROWTH 4 DAYS  Final   Report Status PENDING  Incomplete  Wound culture     Status: None   Collection Time: 09/25/14  3:45 PM  Result Value Ref Range Status   Specimen Description WOUND SACRAL  Final   Special Requests NONE  Final   Gram Stain   Final    FEW WBC PRESENT,BOTH PMN AND MONONUCLEAR NO SQUAMOUS EPITHELIAL CELLS SEEN FEW GRAM POSITIVE COCCI IN PAIRS FEW GRAM NEGATIVE RODS Performed at [facility]    Culture   Final    FEW STAPHYLOCOCCUS AUREUS Note: RIFAMPIN AND GENTAMICIN SHOULD NOT BE USED AS SINGLE DRUGS FOR TREATMENT OF STAPH INFECTIONS. Performed at [facility]    Report Status 09/29/2014 FINAL  Final   Organism ID, Bacteria STAPHYLOCOCCUS AUREUS  Final      Susceptibility   Staphylococcus aureus - MIC*    CLINDAMYCIN <=0.25 SENSITIVE Sensitive     ERYTHROMYCIN <=0.25 SENSITIVE Sensitive     GENTAMICIN <=0.5 SENSITIVE Sensitive     LEVOFLOXACIN 0.25 SENSITIVE Sensitive     OXACILLIN <=0.25 SENSITIVE Sensitive     PENICILLIN 0.06 SENSITIVE Sensitive     RIFAMPIN <=0.5 SENSITIVE Sensitive     TRIMETH/SULFA <=10 SENSITIVE Sensitive     VANCOMYCIN 1 SENSITIVE Sensitive     TETRACYCLINE <=1 SENSITIVE Sensitive     MOXIFLOXACIN <=0.25 SENSITIVE Sensitive     * FEW STAPHYLOCOCCUS AUREUS     Studies: No results found.  Scheduled Meds: . alteplase  2 mg Intracatheter Once  . cefTRIAXone (ROCEPHIN)  IV  1 g Intravenous Q24H  . feeding supplement  237 mL Oral TID BM  . fentaNYL  75 mcg Transdermal Q72H  . folic acid  1 mg Oral Daily   . gabapentin  300 mg Oral BID  . heparin  5,000 Units Subcutaneous 3 times per day  . metroNIDAZOLE  500 mg Oral 3 times per day  . potassium chloride SA  40 mEq Oral TID  . pravastatin  80 mg Oral QHS  . senna  1 tablet Oral BID  . sodium chloride  3 mL Intravenous Q12H  . vancomycin  1,000 mg Intravenous Q24H   Continuous Infusions:   Principal Problem:   Acute renal failure superimposed on stage 3 chronic kidney disease Active Problems:   Breast cancer   Hypertension   Metastasis from breast cancer   Hypercalcemia   Physical deconditioning   Stage IV pressure ulcer of sacral region   Chronic diastolic CHF (congestive heart failure)   Constipation  HLD (hyperlipidemia)   Protein-calorie malnutrition, severe    Time spent: 39mins    Erika Nunez  [facility] Pager [phone]. If 7PM-7AM, please contact night-coverage at [email or web address], password [facility] 09/29/2014, 11:10 AM  LOS: 4 days

## 2014-09-29 NOTE — Progress Notes (Signed)
All dressings for the pt were changed today d/t being heavily soiled and falling off. Stage 4 sacral ulcer was cleaned, re-packed with Ca+ alginate and then dressed with soft silicone foam dressing. Ulcer was red and tunneling but unable to visualize bone. Lesions to the pts breast, chest and abdomen were cleansed as well, and covered with a silver-type dressing (Interdry Ag+), then taped. Pt had a couple of new lesions to chest area, which were brighter red and wet as if they'd just developed or ruptured. Pain medication given. Overall, pt tolerated the dressing changes well.

## 2014-09-30 LAB — CULTURE, BLOOD (ROUTINE X 2)
CULTURE: NO GROWTH
Culture: NO GROWTH

## 2014-09-30 LAB — PREPARE RBC (CROSSMATCH)

## 2014-09-30 LAB — VANCOMYCIN, TROUGH: Vancomycin Tr: 17.8 ug/mL (ref 10.0–20.0)

## 2014-09-30 MED ORDER — ACETAMINOPHEN 325 MG PO TABS
650.0000 mg | ORAL_TABLET | Freq: Once | ORAL | Status: AC
  Administered 2014-09-30: 650 mg via ORAL
  Filled 2014-09-30: qty 2

## 2014-09-30 MED ORDER — SODIUM CHLORIDE 0.9 % IV SOLN
90.0000 mg | Freq: Once | INTRAVENOUS | Status: AC
  Administered 2014-09-30: 90 mg via INTRAVENOUS
  Filled 2014-09-30: qty 10

## 2014-09-30 MED ORDER — SODIUM CHLORIDE 0.9 % IJ SOLN: 10.0000 mL | INTRAMUSCULAR | Status: DC | PRN

## 2014-09-30 MED ORDER — SODIUM CHLORIDE 0.9 % IV SOLN
250.0000 mL | Freq: Once | INTRAVENOUS | Status: AC
  Administered 2014-09-30: 250 mL via INTRAVENOUS

## 2014-09-30 MED ORDER — HEPARIN SOD (PORK) LOCK FLUSH 100 UNIT/ML IV SOLN: 250.0000 [IU] | INTRAVENOUS | Status: DC | PRN

## 2014-09-30 MED ORDER — DIPHENHYDRAMINE HCL 25 MG PO CAPS
25.0000 mg | ORAL_CAPSULE | Freq: Once | ORAL | Status: AC
  Administered 2014-09-30: 25 mg via ORAL
  Filled 2014-09-30: qty 1

## 2014-09-30 MED ORDER — SODIUM CHLORIDE 0.9 % IJ SOLN
3.0000 mL | INTRAMUSCULAR | Status: AC | PRN
  Administered 2014-09-30: 3 mL

## 2014-09-30 NOTE — Progress Notes (Signed)
Subjective: Patient is in bed.  On Friday she was asleep when I visited.   I personally reviewed and went over laboratory results with the patient.  The results are noted within this dictation.  I am concerned that her calcium has not normalized as renal function has improved.  The patient is notified of this.  She will not receive anti-neoplastic treatment while on IV antibiotics.  I have recommended a high protein diet to help with healing.  I had a frank conversation regarding how much time she is spending in bed prior to this hospitalization.  She reported 1 week, but after another discussion, she suspects it is more in the range of weeks.    Objective: Vital signs in last 24 hours: Temp:  [98.4 F (36.9 C)-98.9 F (37.2 C)] 98.5 F (36.9 C) (01/18 0551) Pulse Rate:  [79-86] 83 (01/18 0551) Resp:  [18-20] 20 (01/18 0551) BP: (88-93)/(45-53) 88/52 mmHg (01/18 0551) SpO2:  [97 %-99 %] 97 % (01/18 0551)  Intake/Output from previous day: 01/17 0800 - 01/18 0759 In: 480 [P.O.:480] Out: -  Intake/Output this shift:    General appearance: alert, cooperative and no distress  Lab Results:   Recent Labs  09/29/14 0848  WBC 7.5  HGB 8.2*  HCT 25.7*  PLT 306   BMET  Recent Labs  09/28/14 0555 09/29/14 0848  NA 135 134*  K 4.5 4.6  CL 105 106  CO2 22 23  GLUCOSE 82 100*  BUN 20 18  CREATININE 1.70* 1.50*  CALCIUM 11.3* 11.8*    Studies/Results: No results found.  Medications: I have reviewed the patient's current medications.  Assessment/Plan: 1. Stage IV Breast Cancer, last treated on 08/14/2014. 2. Hypercalcemia of malignancy. Corrected calcium based off of albumin from 09/28/2014 is 12.6.  Will give Pamidronate IV today.  Will defer dosing to pharmacy. 3. Normochromic, normocytic anemia.  Progressive. Will give a 2 unit PRBC transfusion today.    4. Stage IV decubitus ulcer with osteomyelitis of the distal coccyx  5. DNR 6. Will follow as an inpatient.   She will need an outpatient appointment 2-3 weeks out from discharge at [facility].   Patient and plan discussed with Dr. Ancil Linsey and she is in agreement with the aforementioned.     LOS: 5 days    KEFALAS,THOMAS 09/30/2014  Agree with above.  Will give one dose pamidronate as inpatient.  Will f/u with calcium. Agree with 2 U PRBC.  Will continue to address end of life issues. Donald Pore MD

## 2014-09-30 NOTE — Progress Notes (Signed)
Notified to come to the patients room to assess the patient port a cath site due to the pt/daughter  c/o pain and swelling at the site.  The port a cath site was indeed was swollen and cool to touch.  I d/c'd the port, applied heat, and notified Dr. Roderic Palau of the assessment and that the patient was receiving Aredia.  MD agree with intervention.  I notified IV team to access an IV.  No new orders given.  I will continue to monitor the patient.

## 2014-09-30 NOTE — Progress Notes (Signed)
Pt sacral dressing changed. Pt tolerated dressing change well. Pt given morphine for pain and is now resting comfortably.

## 2014-09-30 NOTE — Progress Notes (Signed)
ANTIBIOTIC CONSULT NOTE - follow up  Pharmacy Consult for Vancomycin  Indication: osteomyelitis  Allergies  Allergen Reactions  . Codeine Hives  . Famvir [Famciclovir]     Patient had edema from knees down while taking. When she stopped taking drug, the edema went away.   Patient Measurements: Height: 5\' 7"  (170.2 cm) Weight: 166 lb (75.297 kg) IBW/kg (Calculated) : 61.6  Vital Signs: Temp: 98.5 F (36.9 C) (01/18 0551) Temp Source: Oral (01/18 0551) BP: 88/52 mmHg (01/18 0551) Pulse Rate: 83 (01/18 0551) Intake/Output from previous day: 01/17 0701 - 01/18 0700 In: 480 [P.O.:480] Out: -  Intake/Output from this shift:    Labs:  Recent Labs  09/28/14 0555 09/29/14 0848  WBC  --  7.5  HGB  --  8.2*  PLT  --  306  CREATININE 1.70* 1.50*   Estimated Creatinine Clearance: 40.1 mL/min (by C-G formula based on Cr of 1.5).  Recent Labs  09/30/14 0539  VANCOTROUGH 17.8    Microbiology: Recent Results (from the past 720 hour(s))  Culture, blood (routine x 2)     Status: None (Preliminary result)   Collection Time: 09/25/14 10:05 AM  Result Value Ref Range Status   Specimen Description BLOOD LEFT ARM  Final   Special Requests   Final    BOTTLES DRAWN AEROBIC AND ANAEROBIC AEB=8CC ANA=10CC   Culture NO GROWTH 4 DAYS  Final   Report Status PENDING  Incomplete  Culture, blood (routine x 2)     Status: None (Preliminary result)   Collection Time: 09/25/14 10:10 AM  Result Value Ref Range Status   Specimen Description BLOOD LEFT WRIST  Final   Special Requests BOTTLES DRAWN AEROBIC AND ANAEROBIC 6CC  Final   Culture NO GROWTH 4 DAYS  Final   Report Status PENDING  Incomplete  Wound culture     Status: None   Collection Time: 09/25/14  3:45 PM  Result Value Ref Range Status   Specimen Description WOUND SACRAL  Final   Special Requests NONE  Final   Gram Stain   Final    FEW WBC PRESENT,BOTH PMN AND MONONUCLEAR NO SQUAMOUS EPITHELIAL CELLS SEEN FEW GRAM POSITIVE  COCCI IN PAIRS FEW GRAM NEGATIVE RODS Performed at [facility]    Culture   Final    FEW STAPHYLOCOCCUS AUREUS Note: RIFAMPIN AND GENTAMICIN SHOULD NOT BE USED AS SINGLE DRUGS FOR TREATMENT OF STAPH INFECTIONS. Performed at [facility]    Report Status 09/29/2014 FINAL  Final   Organism ID, Bacteria STAPHYLOCOCCUS AUREUS  Final      Susceptibility   Staphylococcus aureus - MIC*    CLINDAMYCIN <=0.25 SENSITIVE Sensitive     ERYTHROMYCIN <=0.25 SENSITIVE Sensitive     GENTAMICIN <=0.5 SENSITIVE Sensitive     LEVOFLOXACIN 0.25 SENSITIVE Sensitive     OXACILLIN <=0.25 SENSITIVE Sensitive     PENICILLIN 0.06 SENSITIVE Sensitive     RIFAMPIN <=0.5 SENSITIVE Sensitive     TRIMETH/SULFA <=10 SENSITIVE Sensitive     VANCOMYCIN 1 SENSITIVE Sensitive     TETRACYCLINE <=1 SENSITIVE Sensitive     MOXIFLOXACIN <=0.25 SENSITIVE Sensitive     * FEW STAPHYLOCOCCUS AUREUS   Medical History: Past Medical History  Diagnosis Date  . Hypertension   . High cholesterol   . Cancer   . Breast cancer   . Allergy     seasonal allergies  . Arthritis   . Anxiety   . Breast mass, left 2014  . Breast  mass, right 2014  . 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 Depression   . Lymphedema of arm     right arm   Anti-infectives    Start     Dose/Rate Route Frequency Ordered Stop   09/28/14 1600  cefTRIAXone (ROCEPHIN) 1 g in dextrose 5 % 50 mL IVPB - Premix     1 g100 mL/hr over 30 Minutes Intravenous Every 24 hours 09/28/14 1522     09/28/14 1600  metroNIDAZOLE (FLAGYL) tablet 500 mg     500 mg Oral 3 times per day 09/28/14 1522     09/28/14 0600  vancomycin (VANCOCIN) IVPB 1000 mg/200 mL premix     1,000 mg200 mL/hr over 60 Minutes Intravenous Every 24 hours 09/27/14 1231     09/27/14 0600  vancomycin (VANCOCIN) IVPB 1000 mg/200 mL premix  Status:  Discontinued     1,000 mg200 mL/hr over 60 Minutes Intravenous Every 48 hours 09/25/14 1652 09/27/14 1231    09/26/14 1800  ceFEPIme (MAXIPIME) 1 g in dextrose 5 % 50 mL IVPB  Status:  Discontinued     1 g100 mL/hr over 30 Minutes Intravenous Every 24 hours 09/25/14 1653 09/28/14 1522   09/25/14 1730  vancomycin (VANCOCIN) 1,500 mg in sodium chloride 0.9 % 500 mL IVPB     1,500 mg250 mL/hr over 120 Minutes Intravenous  Once 09/25/14 1641 09/25/14 1930   09/25/14 1645  ceFEPIme (MAXIPIME) 2 g in dextrose 5 % 50 mL IVPB     2 g100 mL/hr over 30 Minutes Intravenous  Once 09/25/14 1639 09/25/14 2115     Assessment: 64yo female with progressive weakness and h/o sacral pain and discharge.  Currently undergoing chemo for breast cancer.  SCr elevated on admission but has improved with hydration.  Asked to initiate Vancomycin and Cefepime for possible osteo.  Estimated Creatinine Clearance: 40.1 mL/min (by C-G formula based on Cr of 1.5).  Vancomycin trough level is on target today.  Goal of Therapy:  Vancomycin trough level 15-20 mcg/ml Eradicate infection.  Plan:   Continue Vancomycin 1gm IV q24hrs  Check trough level weekly or sooner if indicated  Continue Rocephin and Flagyl PO per MD  Monitor labs, renal fxn, and cultures  [place] Crane, Savvy Peeters A 09/30/2014,7:48 AM

## 2014-09-30 NOTE — Progress Notes (Signed)
Physical Therapy Treatment Patient Details Name: Erika Nunez MRN: [id number] DOB: 10-21-1950 Today's Date: 09/30/2014    History of Present Illness Pt reports feeling progressive generalized weakness over the past 2 weeks. Denies fevers, CP, SOB, syncope, dysuria, frequency. Becomes winded w/ exertion. 1 week h/o sacral pain and discharge. H2O2 and duoderm placement w/o benefit.  Very little fluid intake during this time. Pt is currently undergoing chemotherapy for bilateral breast cancer (s/p double mastectomy). Last chemo on 08/14/14. Pt presented to oncology outpt dept today w/ symptoms and was sent to the emergency room due to reported renal failure, weakness and a sacral ulcer.     PT Comments    Pt had no significant c/o today but did state that she had poor energy.  She was able to transfer OOB without much difficulty and transferred to her walker with standby assist.   She ambulated in the hallways with a stable gait pattern, able to clear both feet on the floor.  She was instructed in gait on 3 steps, able to do an alternating pattern with handrail assist.  Acute PT goals have been met.  Follow Up Recommendations        Equipment Recommendations  None recommended by PT    Recommendations for Other Services  none     Precautions / Restrictions Precautions Precautions: Fall Restrictions Weight Bearing Restrictions: No    Mobility  Bed Mobility Overal bed mobility: Modified Independent                Transfers Overall transfer level: Needs assistance Equipment used: Rolling walker (2 wheeled) Transfers: Sit to/from Stand Sit to Stand: Supervision            Ambulation/Gait Ambulation/Gait assistance: Modified independent (Device/Increase time) Ambulation Distance (Feet): 250 Feet   Gait Pattern/deviations: WFL(Within Functional Limits)   Gait velocity interpretation: Below normal speed for age/gender General Gait Details: pt had no drag of the LLE  during gait today   Stairs Stairs: Yes Stairs assistance: Supervision Stair Management: Alternating pattern;Two rails;Forwards Number of Stairs: 3    Wheelchair Mobility    Modified Rankin (Stroke Patients Only)       Balance     Sitting balance-Leahy Scale: Good       Standing balance-Leahy Scale: Fair                      Cognition Arousal/Alertness: Awake/alert Behavior During Therapy: WFL for tasks assessed/performed Overall Cognitive Status: Within Functional Limits for tasks assessed                      Exercises      General Comments        Pertinent Vitals/Pain Pain Assessment: No/denies pain    Home Living                      Prior Function            PT Goals (current goals can now be found in the care plan section) Progress towards PT goals: Goals met/education completed, patient discharged from PT    Frequency  Min 3X/week    PT Plan  (PT goals are met)    Co-evaluation             End of Session Equipment Utilized During Treatment: Gait belt Activity Tolerance: Patient tolerated treatment well Patient left: in bed;with call bell/phone within reach;with chair alarm set;with family/visitor present  Time: 9909-4000 PT Time Calculation (min) (ACUTE ONLY): 20 min  Charges:  $Gait Training: 8-22 mins                    G Codes:      Erika Nunez 27-Oct-2014, 3:03 PM

## 2014-09-30 NOTE — Progress Notes (Signed)
[facility] PROGRESS NOTE  ALEXANDR OEHLER HOZ:[id number] DOB: 12-Jul-1951 DOA: 09/25/2014 PCP: [name], MD  Assessment/Plan: 1. 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 Patient will need a prolonged course of intravenous antibiotics of approximately 6 weeks. Case discussed with infectious disease, Dr. Tommy Medal. Recommendations are for vancomycin, Rocephin and Flagyl for a total of 6 weeks. Patient was also seen by wound care recommendations made for local treatment. Continue to follow. 2. Dehydration. Continue with IV fluids 3. AKI on CKD stage III. Likely related to dehydration. Continue current treatments. Creatinine appears improving and is approaching baseline 4. Stage IV breast cancer. Discussed with oncology and at present she does not appear to be a candidate for further treatment at this time. She will follow-up with oncology to discuss further treatment options and the possibility of hospice in the outpatient setting. 5. Chronic diastolic congestive heart failure. Appears compensated. 6. Hypercalcemia, likely related to dehydration. Persists, despite IV fluids. Per oncology, started on pamidronate 7. Essential hypertension. Patient was hypotensive on admission. ACE inhibitor and hydrochlorothiazide currently on hold. 8. Chronic pain. Continue fentanyl patch and oxycodone. 9. Anemia. Likely related to chronic disease. Transfusion of 2 units PRBC today per oncology.  Code Status: DO NOT RESUSCITATE Family Communication: Discussed with patient and son at the bedside Disposition Plan: probable discharge home tomorrow   Consultants:  Oncology  Wound care  Procedures:    Antibiotics:  Vancomycin 1/13>>  Cefepime 1/13>>1/16  Ceftriaxone 1/16>>  Flagyl 1/16>>  HPI/Subjective: Feels that pain in her sacral decubitus ulcers improving. She did have some pain around her Port-A-Cath site during infusion of medication. This was  accompanied with swelling. She reports that since onset, it has improved and is now nearly resolved  Objective: Filed Vitals:   09/30/14 1806  BP: 117/54  Pulse: 80  Temp: 99.3 F (37.4 C)  Resp: 20    Intake/Output Summary (Last 24 hours) at 09/30/14 1847 Last data filed at 09/30/14 7048  Gross per 24 hour  Intake    240 ml  Output      0 ml  Net    240 ml   Filed Weights   09/25/14 1221  Weight: 75.297 kg (166 lb)    Exam:   General:  No acute distress  Cardiovascular: S1, S2, regular rate and rhythm  Respiratory: clear to Auscultation bilaterally  Abdomen: Soft, nontender, positive bowel sounds  Musculoskeletal: Stage IV sacral decubitus ulcer, no significant surrounding erythema. Seropurulent discharge with foul smell  Data Reviewed: Basic Metabolic Panel:  Recent Labs Lab 09/25/14 2152 09/26/14 0641 09/27/14 0632 09/28/14 0555 09/29/14 0848  NA 135 139 139 135 134*  K 3.4* 4.1 4.7 4.5 4.6  CL 104 107 107 105 106  CO2 22 24 25 22 23   GLUCOSE 86 94 89 82 100*  BUN 44* 39* 25* 20 18  CREATININE 3.03* 2.51* 1.87* 1.70* 1.50*  CALCIUM 11.5* 11.7* 11.7* 11.3* 11.8*   Liver Function Tests:  Recent Labs Lab 09/25/14 1016 09/28/14 0555  AST 34 29  ALT 30 22  ALKPHOS 119* 87  BILITOT 0.6 0.6  PROT 7.3 6.5  ALBUMIN 3.4* 3.0*   No results for input(s): LIPASE, AMYLASE in the last 168 hours. No results for input(s): AMMONIA in the last 168 hours. CBC:  Recent Labs Lab 09/25/14 1016 09/26/14 0641 09/27/14 0632 09/29/14 0848  WBC 11.8* 7.2 8.1 7.5  NEUTROABS 10.3*  --   --   --  HGB 9.3* 8.4* 8.3* 8.2*  HCT 28.0* 25.8* 26.0* 25.7*  MCV 90.3 90.5 92.5 91.8  PLT 391 272 308 306   Cardiac Enzymes: No results for input(s): CKTOTAL, CKMB, CKMBINDEX, TROPONINI in the last 168 hours. BNP (last 3 results) No results for input(s): PROBNP in the last 8760 hours. CBG: No results for input(s): GLUCAP in the last 168 hours.  Recent Results (from  the past 240 hour(s))  Culture, blood (routine x 2)     Status: None   Collection Time: 09/25/14 10:05 AM  Result Value Ref Range Status   Specimen Description BLOOD LEFT ARM  Final   Special Requests   Final    BOTTLES DRAWN AEROBIC AND ANAEROBIC AEB=8CC ANA=10CC   Culture NO GROWTH 5 DAYS  Final   Report Status 09/30/2014 FINAL  Final  Culture, blood (routine x 2)     Status: None   Collection Time: 09/25/14 10:10 AM  Result Value Ref Range Status   Specimen Description BLOOD LEFT WRIST  Final   Special Requests BOTTLES DRAWN AEROBIC AND ANAEROBIC 6CC  Final   Culture NO GROWTH 5 DAYS  Final   Report Status 09/30/2014 FINAL  Final  Wound culture     Status: None   Collection Time: 09/25/14  3:45 PM  Result Value Ref Range Status   Specimen Description WOUND SACRAL  Final   Special Requests NONE  Final   Gram Stain   Final    FEW WBC PRESENT,BOTH PMN AND MONONUCLEAR NO SQUAMOUS EPITHELIAL CELLS SEEN FEW GRAM POSITIVE COCCI IN PAIRS FEW GRAM NEGATIVE RODS Performed at [facility]    Culture   Final    FEW STAPHYLOCOCCUS AUREUS Note: RIFAMPIN AND GENTAMICIN SHOULD NOT BE USED AS SINGLE DRUGS FOR TREATMENT OF STAPH INFECTIONS. Performed at [facility]    Report Status 09/29/2014 FINAL  Final   Organism ID, Bacteria STAPHYLOCOCCUS AUREUS  Final      Susceptibility   Staphylococcus aureus - MIC*    CLINDAMYCIN <=0.25 SENSITIVE Sensitive     ERYTHROMYCIN <=0.25 SENSITIVE Sensitive     GENTAMICIN <=0.5 SENSITIVE Sensitive     LEVOFLOXACIN 0.25 SENSITIVE Sensitive     OXACILLIN <=0.25 SENSITIVE Sensitive     PENICILLIN 0.06 SENSITIVE Sensitive     RIFAMPIN <=0.5 SENSITIVE Sensitive     TRIMETH/SULFA <=10 SENSITIVE Sensitive     VANCOMYCIN 1 SENSITIVE Sensitive     TETRACYCLINE <=1 SENSITIVE Sensitive     MOXIFLOXACIN <=0.25 SENSITIVE Sensitive     * FEW STAPHYLOCOCCUS AUREUS     Studies: No results found.  Scheduled Meds: . alteplase  2 mg  Intracatheter Once  . cefTRIAXone (ROCEPHIN)  IV  1 g Intravenous Q24H  . feeding supplement  237 mL Oral TID BM  . fentaNYL  75 mcg Transdermal Q72H  . folic acid  1 mg Oral Daily  . gabapentin  300 mg Oral BID  . heparin  5,000 Units Subcutaneous 3 times per day  . metroNIDAZOLE  500 mg Oral 3 times per day  . potassium chloride SA  40 mEq Oral TID  . pravastatin  80 mg Oral QHS  . senna  1 tablet Oral BID  . sodium chloride  3 mL Intravenous Q12H  . vancomycin  1,000 mg Intravenous Q24H   Continuous Infusions:   Principal Problem:   Acute renal failure superimposed on stage 3 chronic kidney disease Active Problems:   Breast cancer   Hypertension   Metastasis  from breast cancer   Hypercalcemia   Physical deconditioning   Stage IV pressure ulcer of sacral region   Chronic diastolic CHF (congestive heart failure)   Constipation   HLD (hyperlipidemia)   Protein-calorie malnutrition, severe    Time spent: 87mins    Honey Zakarian  [facility] Pager [phone]. If 7PM-7AM, please contact night-coverage at [email or web address], password [facility] 09/30/2014, 6:47 PM  LOS: 5 days

## 2014-10-01 ENCOUNTER — Inpatient Hospital Stay (HOSPITAL_COMMUNITY): Payer: Self-pay

## 2014-10-01 DIAGNOSIS — I82629 Acute embolism and thrombosis of deep veins of unspecified upper extremity: Secondary | ICD-10-CM | POA: Diagnosis not present

## 2014-10-01 DIAGNOSIS — I82622 Acute embolism and thrombosis of deep veins of left upper extremity: Secondary | ICD-10-CM

## 2014-10-01 LAB — BASIC METABOLIC PANEL
Anion gap: 6 (ref 5–15)
BUN: 18 mg/dL (ref 6–23)
CO2: 22 mmol/L (ref 19–32)
CREATININE: 1.15 mg/dL — AB (ref 0.50–1.10)
Calcium: 11.6 mg/dL — ABNORMAL HIGH (ref 8.4–10.5)
Chloride: 107 mEq/L (ref 96–112)
GFR calc non Af Amer: 49 mL/min — ABNORMAL LOW (ref >90)
GFR, EST AFRICAN AMERICAN: 57 mL/min — AB (ref >90)
Glucose, Bld: 93 mg/dL (ref 70–99)
POTASSIUM: 4.8 mmol/L (ref 3.5–5.1)
Sodium: 135 mmol/L (ref 135–145)

## 2014-10-01 LAB — TYPE AND SCREEN
ABO/RH(D): A POS
ANTIBODY SCREEN: NEGATIVE
UNIT DIVISION: 0
UNIT DIVISION: 0

## 2014-10-01 LAB — CBC
HEMATOCRIT: 33 % — AB (ref 36.0–46.0)
Hemoglobin: 10.9 g/dL — ABNORMAL LOW (ref 12.0–15.0)
MCH: 28.8 pg (ref 26.0–34.0)
MCHC: 33 g/dL (ref 30.0–36.0)
MCV: 87.1 fL (ref 78.0–100.0)
PLATELETS: 295 10*3/uL (ref 150–400)
RBC: 3.79 MIL/uL — ABNORMAL LOW (ref 3.87–5.11)
RDW: 16.5 % — ABNORMAL HIGH (ref 11.5–15.5)
WBC: 7.7 10*3/uL (ref 4.0–10.5)

## 2014-10-01 MED ORDER — RIVAROXABAN 20 MG PO TABS: 20.0000 mg | ORAL_TABLET | Freq: Every day | ORAL | Status: DC

## 2014-10-01 MED ORDER — RIVAROXABAN 15 MG PO TABS
15.0000 mg | ORAL_TABLET | Freq: Two times a day (BID) | ORAL | Status: DC
  Administered 2014-10-01 – 2014-10-03 (×4): 15 mg via ORAL
  Filled 2014-10-01 (×4): qty 1

## 2014-10-01 MED ORDER — SODIUM CHLORIDE 0.9 % IJ SOLN
INTRAMUSCULAR | Status: AC
Start: 2014-10-01 — End: 2014-10-01
  Administered 2014-10-01: 10 mL
  Filled 2014-10-01: qty 3

## 2014-10-01 MED ORDER — MORPHINE SULFATE 2 MG/ML IJ SOLN
2.0000 mg | INTRAMUSCULAR | Status: DC | PRN
  Administered 2014-10-01 – 2014-10-03 (×12): 2 mg via INTRAVENOUS
  Filled 2014-10-01 (×12): qty 1

## 2014-10-01 MED ORDER — IOHEXOL 300 MG/ML  SOLN
50.0000 mL | Freq: Once | INTRAMUSCULAR | Status: AC | PRN
  Administered 2014-10-01: 50 mL via INTRAVENOUS

## 2014-10-01 NOTE — Progress Notes (Signed)
Dressing to chest/back and sacrum changed as per order. Patient tolerated well. Port to left chest reaccessed by Jyl Heinz and Jomarie Longs. No blood return noted, but patients daughter stated she has not had blood return for the last 2 treatments. Port flushed without problem. Port saline locked. Awaiting for radiology to check port.

## 2014-10-01 NOTE — Progress Notes (Signed)
[facility] PROGRESS NOTE  Erika Nunez FFM:[id number] DOB: 12/20/1950 DOA: 09/25/2014 PCP: [name], MD  Summary:  This is a 64 year old patient with stage IV breast cancer who presents to the oncology clinic for regular follow-up. She was found to be increasingly weak, dehydrated and had a stage IV sacral decubitus ulcer. 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 She was seen by wound care who recommended local treatments. General surgery did not have any surgical management to offer for her to swallow this. Case was discussed with infectious disease who recommended antibiotics as listed below for a total of 6 weeks. Oncology will plan on following up with patient regarding further treatment of her breast cancer. They have not discussed this with the patient, but will plan on broaching the subject of hospice during her follow-up visit. She was also noted to have a left upper extremity DVT during this hospitalization which has made it unsafe to use her Port-A-Cath. She is in the process of receiving a right sided PICC line after which she can likely be discharged home on antibiotics.  Assessment/Plan: 1. 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 Patient will need a prolonged course of intravenous antibiotics of approximately 6 weeks. Case discussed with infectious disease, Dr. Tommy Medal. Recommendations are for vancomycin, Rocephin and Flagyl for a total of 6 weeks. Patient was also seen by wound care recommendations made for local treatment. She will need a PICC line to be placed tomorrow for antibiotics, since her Port-A-Cath is no longer accessible. Continue to follow. 2. Dehydration. Continue with IV fluids 3. AKI on CKD stage III. Likely related to dehydration. Continue current treatments. Creatinine appears improving and is approaching  baseline 4. Stage IV breast cancer. Discussed with oncology and at present she does not appear to be a candidate for further treatment at this time. She will follow-up with oncology to discuss further treatment options and the possibility of hospice in the outpatient setting. 5. Chronic diastolic congestive heart failure. Appears compensated. 6. Hypercalcemia, likely related to dehydration. Persists, despite IV fluids. Per oncology, treated with pamidronate 7. Essential hypertension. Patient was hypotensive on admission. ACE inhibitor and hydrochlorothiazide currently on hold. 8. Chronic pain. Continue fentanyl patch and oxycodone. 9. Anemia. Likely related to chronic disease. Transfused 2 units PRBC per oncology. 10. Left upper extremity DVT. Found on fluoroscopy study to evaluate Port-A-Cath today. With this underlying thrombus, does not appear that Port-A-Cath can be safely used. Patient will need to be started on anticoagulation and has elected Xarelto. She will need PICC line placed in her right arm to continue with intravenous antibiotics.  Code Status: DO NOT RESUSCITATE Family Communication: Discussed with patient and son at the bedside Disposition Plan: probable discharge home tomorrow   Consultants:  Oncology  Wound care  Procedures:    Antibiotics:  Vancomycin 1/13>>  Cefepime 1/13>>1/16  Ceftriaxone 1/16>>  Flagyl 1/16>>  HPI/Subjective: Patient describes swelling and pain around portacath site yesterday during infusion of medications. Feels it has improved since then.  Objective: Filed Vitals:   10/01/14 1648  BP: 111/57  Pulse: 83  Temp: 98.9 F (37.2 C)  Resp: 18    Intake/Output Summary (Last 24 hours) at 10/01/14 2013 Last data filed at 10/01/14 1724  Gross per 24 hour  Intake 1358.5 ml  Output      0 ml  Net 1358.5 ml  Filed Weights   09/25/14 1221  Weight: 75.297 kg (166 lb)    Exam:   General:  No acute distress  Cardiovascular: S1,  S2, regular rate and rhythm  Respiratory: clear to Auscultation bilaterally  Abdomen: Soft, nontender, positive bowel sounds  Musculoskeletal: Stage IV sacral decubitus ulcer, no significant surrounding erythema. Wound is packed with gauze which was not removed.  Data Reviewed: Basic Metabolic Panel:  Recent Labs Lab 09/26/14 0641 09/27/14 0632 09/28/14 0555 09/29/14 0848 10/01/14 0555  NA 139 139 135 134* 135  K 4.1 4.7 4.5 4.6 4.8  CL 107 107 105 106 107  CO2 24 25 22 23 22   GLUCOSE 94 89 82 100* 93  BUN 39* 25* 20 18 18   CREATININE 2.51* 1.87* 1.70* 1.50* 1.15*  CALCIUM 11.7* 11.7* 11.3* 11.8* 11.6*   Liver Function Tests:  Recent Labs Lab 09/25/14 1016 09/28/14 0555  AST 34 29  ALT 30 22  ALKPHOS 119* 87  BILITOT 0.6 0.6  PROT 7.3 6.5  ALBUMIN 3.4* 3.0*   No results for input(s): LIPASE, AMYLASE in the last 168 hours. No results for input(s): AMMONIA in the last 168 hours. CBC:  Recent Labs Lab 09/25/14 1016 09/26/14 0641 09/27/14 0632 09/29/14 0848 10/01/14 0555  WBC 11.8* 7.2 8.1 7.5 7.7  NEUTROABS 10.3*  --   --   --   --   HGB 9.3* 8.4* 8.3* 8.2* 10.9*  HCT 28.0* 25.8* 26.0* 25.7* 33.0*  MCV 90.3 90.5 92.5 91.8 87.1  PLT 391 272 308 306 295   Cardiac Enzymes: No results for input(s): CKTOTAL, CKMB, CKMBINDEX, TROPONINI in the last 168 hours. BNP (last 3 results) No results for input(s): PROBNP in the last 8760 hours. CBG: No results for input(s): GLUCAP in the last 168 hours.  Recent Results (from the past 240 hour(s))  Culture, blood (routine x 2)     Status: None   Collection Time: 09/25/14 10:05 AM  Result Value Ref Range Status   Specimen Description BLOOD LEFT ARM  Final   Special Requests   Final    BOTTLES DRAWN AEROBIC AND ANAEROBIC AEB=8CC ANA=10CC   Culture NO GROWTH 5 DAYS  Final   Report Status 09/30/2014 FINAL  Final  Culture, blood (routine x 2)     Status: None   Collection Time: 09/25/14 10:10 AM  Result Value Ref  Range Status   Specimen Description BLOOD LEFT WRIST  Final   Special Requests BOTTLES DRAWN AEROBIC AND ANAEROBIC 6CC  Final   Culture NO GROWTH 5 DAYS  Final   Report Status 09/30/2014 FINAL  Final  Wound culture     Status: None   Collection Time: 09/25/14  3:45 PM  Result Value Ref Range Status   Specimen Description WOUND SACRAL  Final   Special Requests NONE  Final   Gram Stain   Final    FEW WBC PRESENT,BOTH PMN AND MONONUCLEAR NO SQUAMOUS EPITHELIAL CELLS SEEN FEW GRAM POSITIVE COCCI IN PAIRS FEW GRAM NEGATIVE RODS Performed at [facility]    Culture   Final    FEW STAPHYLOCOCCUS AUREUS Note: RIFAMPIN AND GENTAMICIN SHOULD NOT BE USED AS SINGLE DRUGS FOR TREATMENT OF STAPH INFECTIONS. Performed at [facility]    Report Status 09/29/2014 FINAL  Final   Organism ID, Bacteria STAPHYLOCOCCUS AUREUS  Final      Susceptibility   Staphylococcus aureus - MIC*    CLINDAMYCIN <=0.25 SENSITIVE Sensitive     ERYTHROMYCIN <=0.25 SENSITIVE  Sensitive     GENTAMICIN <=0.5 SENSITIVE Sensitive     LEVOFLOXACIN 0.25 SENSITIVE Sensitive     OXACILLIN <=0.25 SENSITIVE Sensitive     PENICILLIN 0.06 SENSITIVE Sensitive     RIFAMPIN <=0.5 SENSITIVE Sensitive     TRIMETH/SULFA <=10 SENSITIVE Sensitive     VANCOMYCIN 1 SENSITIVE Sensitive     TETRACYCLINE <=1 SENSITIVE Sensitive     MOXIFLOXACIN <=0.25 SENSITIVE Sensitive     * FEW STAPHYLOCOCCUS AUREUS     Studies: Dg Fluoro Rm 1-60 Min  10/01/2014   CLINICAL DATA:  Breast cancer, nonfunctional Port-A-Cath  EXAM: FLOURO RM 1-60 MIN / PORTACATH CHECK  TECHNIQUE: Indwelling LEFT subclavian Port-A-Cath was injected with contrast and assessed.  CONTRAST:  8 mL OMNIPAQUE IOHEXOL 300 MG/ML  SOLN IV  FLUOROSCOPY TIME:  0 min 30 seconds  COMPARISON:  None  FINDINGS: Patient presented with Port-A-Cath already accessed.  Unable to obtain blood return.  Significant resistance to injection of contrast material.  Injected contrast  opacifies the Port-A-Cath reservoir without extravasation.  Port-A-Cath tubing connection to the reservoir outlet is intact.  Port-A-Cath tubing appears intact.  With injection of contrast, no free spillage of contrast into the SVC is seen.  Significant contrast tracks proximally along the Port-A-Cath catheter the level were the catheter crosses under the LEFT clavicle compatible with a long fibrin sheath.  Additionally, retained foci of contrast are identified cranial to the catheter in the LEFT brachiocephalic vein beginning at the midline and extending to the LEFT several cm, likely representing retained contrast within a segment of thrombus in the LEFT brachiocephalic vein.  IMPRESSION: Long fibrin sheath causing significant obstruction of outflow from the Port-A-Cath catheter.  Retained contrast collection within the LEFT brachiocephalic vein cranial to the catheter tubing, concerning for segment of thrombus.  Findings called to Kirby Crigler PA on 10/01/2014 at 1325 hr.   Electronically Signed   By: Lavonia Dana M.D.   On: 10/01/2014 15:36    Scheduled Meds: . alteplase  2 mg Intracatheter Once  . cefTRIAXone (ROCEPHIN)  IV  1 g Intravenous Q24H  . feeding supplement  237 mL Oral TID BM  . fentaNYL  75 mcg Transdermal Q72H  . folic acid  1 mg Oral Daily  . gabapentin  300 mg Oral BID  . metroNIDAZOLE  500 mg Oral 3 times per day  . potassium chloride SA  40 mEq Oral TID  . pravastatin  80 mg Oral QHS  . rivaroxaban  15 mg Oral BID   Followed by  . [START ON 10/22/2014] rivaroxaban  20 mg Oral Q supper  . senna  1 tablet Oral BID  . sodium chloride  3 mL Intravenous Q12H  . vancomycin  1,000 mg Intravenous Q24H   Continuous Infusions:   Principal Problem:   Acute renal failure superimposed on stage 3 chronic kidney disease Active Problems:   Breast cancer   Hypertension   Metastasis from breast cancer   Hypercalcemia   Physical deconditioning   Stage IV pressure ulcer of sacral region    Chronic diastolic CHF (congestive heart failure)   Constipation   HLD (hyperlipidemia)   Protein-calorie malnutrition, severe    Time spent: 84mins    MEMON,JEHANZEB  [facility] Pager [phone]. If 7PM-7AM, please contact night-coverage at [email or web address], password [facility] 10/01/2014, 8:13 PM  LOS: 6 days

## 2014-10-01 NOTE — Progress Notes (Signed)
Subjective: Patient in bed with daughter at the bedside.  She reports that she is getting out of bed and ambulating.  She denies any complaints this AM.  She tolerated interventions yesterday (PRBC and Pamidronate infusions).  She noted some burning with the Pamidronate infusion surrounding the port site.  Objective: Vital signs in last 24 hours: Temp:  [98.2 F (36.8 C)-99.3 F (37.4 C)] 98.2 F (36.8 C) (01/19 0606) Pulse Rate:  [75-90] 75 (01/19 0606) Resp:  [18-20] 18 (01/19 0606) BP: (94-144)/(47-57) 128/57 mmHg (01/19 0606) SpO2:  [97 %-100 %] 100 % (01/19 0606)  Intake/Output from previous day: 01/18 0800 - 01/19 0759 In: 748.5 [P.O.:240; Blood:508.5] Out: -  Intake/Output this shift:    General appearance: alert, cooperative, appears older than stated age, no distress, moderately obese and pale Port- intact with some erythema following tape lines, nontender.    Lab Results:   Recent Labs  09/29/14 0848 10/01/14 0555  WBC 7.5 7.7  HGB 8.2* 10.9*  HCT 25.7* 33.0*  PLT 306 295   BMET  Recent Labs  09/29/14 0848 10/01/14 0555  NA 134* 135  K 4.6 4.8  CL 106 107  CO2 23 22  GLUCOSE 100* 93  BUN 18 18  CREATININE 1.50* 1.15*  CALCIUM 11.8* 11.6*    Studies/Results: No results found.  Medications: I have reviewed the patient's current medications.  Assessment/Plan: 1. Stage IV Breast Cancer, last treated on 08/14/2014. 2. Hypercalcemia, secondary to dehydration, improved. Corrected calcium based off of albumin from 09/28/2014 is 12.6. S/P 90 mg of Pamidronate on 09/30/2014. May repeat as an outpatient depending on response. 3. Normochromic, normocytic anemia. Progressive. S/P 2 units of PRBCs on 09/30/2014 with appropriate response in Hgb.  4. 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  On IV antibiotics. 5. DNR 6. Oncologically, she is free for discharge at the attending's discretion.  Will follow as an inpatient. Outpatient  appointment at [facility] is Feb 9th at 2:00 PM with Dr. Whitney Muse. Will perform labs at that time if indicated.   Patient and plan discussed with Dr. Ancil Linsey and she is in agreement with the aforementioned.     LOS: 6 days    KEFALAS,THOMAS 10/01/2014  Addendum: After discussion with the hospitalist, Dr. Roderic Palau, I have ordered an IV dye study of the port-a-cath.  Report follows:  Long fibrin sheath causing significant obstruction of outflow from the Port-A-Cath catheter.  Retained contrast collection within the LEFT brachiocephalic vein cranial to the catheter tubing, concerning for segment of thrombus.  With this information, she will need a right-sided PICC.  Additionally, she will need anticoagulation and we recommend Lovenox versus Xarelto.  We discussed the pros and cons of each option.  She chooses Xarelto.  I discussed the risks, benefits, and alternatives.   Orders placed for Xarelto and PICC line.  KEFALAS,THOMAS 10/01/2014  As above.  Patient will be d/c'd on Xarelto.  Antibiotics through home health. She will f/u with oncology as an outpatient about 1 week post d/c.  She is not emotionally on board with hospice at this point. Donald Pore MD

## 2014-10-01 NOTE — Progress Notes (Signed)
NUTRITION FOLLOW UP   Pt meets criteria for severe MALNUTRITION in the context of acute illness as evidenced by 10.6% wt loss x 1 month, mild to moderate fat and muscle depletion, <75% estimated energy intake x 1 month.  Intervention:   -Continue Boost Plus TID  Nutrition Dx:   Increased nutrient needs related to wound healing, cancer treatment as evidenced by estimated needs; ongoing  Goal:   will meet >90% of estimated nutritional needs; progressing  Monitor:   PO/supplement intake, labs, weight changes, I/O's  Assessment:   Erika Nunez is a 64 y.o. female  Pt reports feeling progressive generalized weakness over the past 2 weeks. Denies fevers, CP, SOB, syncope, dysuria, frequency. Becomes winded w/ exertion. 1 week h/o sacral pain and discharge. H2O2 and duoderm placement w/o benefit. Very little fluid intake during this time. Pt is currently undergoing chemotherapy for bilateral breast cancer (s/p double mastectomy). Last chemo on 08/14/14. Pt presented to oncology outpt dept today w/ symptoms and was sent to the emergency room due to reported renal failure, weakness and a sacral ulcer.   Appetite has improved this admission. Noted 50-75% of meal completion. Pt continues to accept Boost supplements well.  No new weight since last visit, so unable to assess weight changes at this time. Pt continues to be followed by wound care due to stage IV pressure ulcer. Discharge disposition is home with home health for IV antibiotics. Per MD, goals of care discussion has been deferred to an outpatient setting.  Labs reviewed. Creat: 1.15, Calcium: 11.6.  Height: Ht Readings from Last 1 Encounters:  09/25/14 5\' 7"  (1.702 m)    Weight Status:   Wt Readings from Last 1 Encounters:  09/25/14 166 lb (75.297 kg)    Re-estimated needs:  Kcal: 2200-2400 Protein: 103-113 grams Fluid: > 2.0 L  Skin: stage IV sacral pressure ulcer  Diet Order:  Heart Healthy   Intake/Output Summary  (Last 24 hours) at 10/01/14 1414 Last data filed at 10/01/14 0941  Gross per 24 hour  Intake  868.5 ml  Output      0 ml  Net  868.5 ml    Last BM: 09/30/14   Labs:   Recent Labs Lab 09/28/14 0555 09/29/14 0848 10/01/14 0555  NA 135 134* 135  K 4.5 4.6 4.8  CL 105 106 107  CO2 22 23 22   BUN 20 18 18   CREATININE 1.70* 1.50* 1.15*  CALCIUM 11.3* 11.8* 11.6*  GLUCOSE 82 100* 93    CBG (last 3)  No results for input(s): GLUCAP in the last 72 hours.  Scheduled Meds: . alteplase  2 mg Intracatheter Once  . cefTRIAXone (ROCEPHIN)  IV  1 g Intravenous Q24H  . feeding supplement  237 mL Oral TID BM  . fentaNYL  75 mcg Transdermal Q72H  . folic acid  1 mg Oral Daily  . gabapentin  300 mg Oral BID  . heparin  5,000 Units Subcutaneous 3 times per day  . metroNIDAZOLE  500 mg Oral 3 times per day  . potassium chloride SA  40 mEq Oral TID  . pravastatin  80 mg Oral QHS  . senna  1 tablet Oral BID  . sodium chloride  3 mL Intravenous Q12H  . vancomycin  1,000 mg Intravenous Q24H    Continuous Infusions:   Courtany Mcmurphy A. Jimmye Norman, RD, LDN, CDE Pager: [phone]

## 2014-10-01 NOTE — Progress Notes (Signed)
ANTICOAGULATION CONSULT NOTE - Initial Consult  Pharmacy Consult for Xarelto Indication: DVT  Allergies  Allergen Reactions  . Codeine Hives  . Famvir [Famciclovir]     Patient had edema from knees down while taking. When she stopped taking drug, the edema went away.    Patient Measurements: Height: 5\' 7"  (170.2 cm) Weight: 166 lb (75.297 kg) IBW/kg (Calculated) : 61.6  Vital Signs: Temp: 98.9 F (37.2 C) (01/19 1648) Temp Source: Oral (01/19 1648) BP: 111/57 mmHg (01/19 1648) Pulse Rate: 83 (01/19 1648)  Labs:  Recent Labs  09/29/14 0848 10/01/14 0555  HGB 8.2* 10.9*  HCT 25.7* 33.0*  PLT 306 295  CREATININE 1.50* 1.15*    Estimated Creatinine Clearance: 52.4 mL/min (by C-G formula based on Cr of 1.15).   Medical History: Past Medical History  Diagnosis Date  . Hypertension   . High cholesterol   . Cancer   . Breast cancer   . Allergy     seasonal allergies  . Arthritis   . Anxiety   . Breast mass, left 2014  . Breast mass, right 2014  . 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 Depression   . Lymphedema of arm     right arm    Medications:  Scheduled:  . alteplase  2 mg Intracatheter Once  . cefTRIAXone (ROCEPHIN)  IV  1 g Intravenous Q24H  . feeding supplement  237 mL Oral TID BM  . fentaNYL  75 mcg Transdermal Q72H  . folic acid  1 mg Oral Daily  . gabapentin  300 mg Oral BID  . metroNIDAZOLE  500 mg Oral 3 times per day  . potassium chloride SA  40 mEq Oral TID  . pravastatin  80 mg Oral QHS  . rivaroxaban  15 mg Oral BID   Followed by  . [START ON 10/22/2014] rivaroxaban  20 mg Oral Q supper  . senna  1 tablet Oral BID  . sodium chloride  3 mL Intravenous Q12H  . vancomycin  1,000 mg Intravenous Q24H    Assessment: 31 yoF who is well known to pharmacy services from antibiotic dosing for osteomyelitis.  She was planning to discharge home today with IV antibiotics, however port-a-cath became obstructed and she was  noted to have possible thrombus.   Plan to place PICC line and start Xarelto for DVT.   CBC reviewed.  No active bleeding noted.  Renal function is improved.  Estimated CrCl >23ml/min.   Plan:  Xarelto 15mg  po bid x 3 weeks followed by 20mg  po daily with food Monitor for s/sx of bleeding Follow-up with patient for Xarelto education  Biagio Borg 10/01/2014,5:02 PM

## 2014-10-02 DIAGNOSIS — I82629 Acute embolism and thrombosis of deep veins of unspecified upper extremity: Secondary | ICD-10-CM

## 2014-10-02 DIAGNOSIS — I82290 Acute embolism and thrombosis of other thoracic veins: Secondary | ICD-10-CM

## 2014-10-02 DIAGNOSIS — D649 Anemia, unspecified: Secondary | ICD-10-CM

## 2014-10-02 DIAGNOSIS — M869 Osteomyelitis, unspecified: Secondary | ICD-10-CM

## 2014-10-02 DIAGNOSIS — C7931 Secondary malignant neoplasm of brain: Secondary | ICD-10-CM

## 2014-10-02 DIAGNOSIS — C50411 Malignant neoplasm of upper-outer quadrant of right female breast: Secondary | ICD-10-CM

## 2014-10-02 DIAGNOSIS — C7989 Secondary malignant neoplasm of other specified sites: Secondary | ICD-10-CM

## 2014-10-02 MED ORDER — RIVAROXABAN 20 MG PO TABS: 20.0000 mg | ORAL_TABLET | Freq: Every day | ORAL | Status: AC

## 2014-10-02 NOTE — Progress Notes (Signed)
ANTICOAGULATION CONSULT NOTE - follow up  Pharmacy Consult for Xarelto Indication: DVT  Allergies  Allergen Reactions  . Codeine Hives  . Famvir [Famciclovir]     Patient had edema from knees down while taking. When she stopped taking drug, the edema went away.   Patient Measurements: Height: 5\' 7"  (170.2 cm) Weight: 166 lb (75.297 kg) IBW/kg (Calculated) : 61.6  Vital Signs: Temp: 98 F (36.7 C) (01/20 0530) Temp Source: Oral (01/20 0530) BP: 100/56 mmHg (01/20 0530) Pulse Rate: 73 (01/20 0530)  Labs:  Recent Labs  10/01/14 0555  HGB 10.9*  HCT 33.0*  PLT 295  CREATININE 1.15*   Estimated Creatinine Clearance: 52.4 mL/min (by C-G formula based on Cr of 1.15).  Medical History: Past Medical History  Diagnosis Date  . Hypertension   . High cholesterol   . Cancer   . Breast cancer   . Allergy     seasonal allergies  . Arthritis   . Anxiety   . Breast mass, left 2014  . Breast mass, right 2014  . 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 Depression   . Lymphedema of arm     right arm   Medications:  Scheduled:  . alteplase  2 mg Intracatheter Once  . cefTRIAXone (ROCEPHIN)  IV  1 g Intravenous Q24H  . feeding supplement  237 mL Oral TID BM  . fentaNYL  75 mcg Transdermal Q72H  . folic acid  1 mg Oral Daily  . gabapentin  300 mg Oral BID  . metroNIDAZOLE  500 mg Oral 3 times per day  . potassium chloride SA  40 mEq Oral TID  . pravastatin  80 mg Oral QHS  . rivaroxaban  15 mg Oral BID   Followed by  . [START ON 10/22/2014] rivaroxaban  20 mg Oral Q supper  . senna  1 tablet Oral BID  . sodium chloride  3 mL Intravenous Q12H  . vancomycin  1,000 mg Intravenous Q24H   Assessment: 39 yoF who is well known to pharmacy services from antibiotic dosing for osteomyelitis.  She was planning to discharge home with IV antibiotics, however port-a-cath became obstructed and she was noted to have possible thrombus.   Plan to place PICC line and  start Xarelto for DVT.   CBC reviewed.  No active bleeding noted.  Renal function is improved.  Estimated CrCl >44ml/min.   Plan:  Xarelto 15mg  po bid x 3 weeks followed by 20mg  po daily with food Monitor for s/sx of bleeding Xarelto education has been completed  [place] Crane, Latorsha Curling A 10/02/2014,11:41 AM

## 2014-10-02 NOTE — Progress Notes (Signed)
Spoke with Rip Harbour at [facility] Radiology Dept regarding patient's apt for IR fluro guided PICC line placement set up for tomorrow @ 9:30am. [facility] notified and set-up transport for patient from [facility] to [facility] & back tomorrow. Patient and daughter aware. Patient confirmed she would sign consent for procedure once she arrives to [facility] tomorrow.

## 2014-10-02 NOTE — Progress Notes (Signed)
Subjective: Patient in bed.  Daughter is at the bedside.   PICC team is ready to attempt a right arm PICC insertion.  Objective: Vital signs in last 24 hours: Temp:  [98 F (36.7 C)-98.9 F (37.2 C)] 98 F (36.7 C) (01/20 0530) Pulse Rate:  [73-83] 73 (01/20 0530) Resp:  [16-18] 18 (01/20 0530) BP: (100-111)/(56-60) 100/56 mmHg (01/20 0530) SpO2:  [97 %-98 %] 98 % (01/20 0530)  Intake/Output from previous day: 01/19 0800 - 01/20 0759 In: 850 [P.O.:800; IV Piggyback:50] Out: -  Intake/Output this shift:    General appearance: alert, appears older than stated age, moderately obese and pale  Lab Results:   Recent Labs  10/01/14 0555  WBC 7.7  HGB 10.9*  HCT 33.0*  PLT 295   BMET  Recent Labs  10/01/14 0555  NA 135  K 4.8  CL 107  CO2 22  GLUCOSE 93  BUN 18  CREATININE 1.15*  CALCIUM 11.6*    Studies/Results: Dg Fluoro Rm 1-60 Min  10/01/2014   CLINICAL DATA:  Breast cancer, nonfunctional Port-A-Cath  EXAM: FLOURO RM 1-60 MIN / PORTACATH CHECK  TECHNIQUE: Indwelling LEFT subclavian Port-A-Cath was injected with contrast and assessed.  CONTRAST:  8 mL OMNIPAQUE IOHEXOL 300 MG/ML  SOLN IV  FLUOROSCOPY TIME:  0 min 30 seconds  COMPARISON:  None  FINDINGS: Patient presented with Port-A-Cath already accessed.  Unable to obtain blood return.  Significant resistance to injection of contrast material.  Injected contrast opacifies the Port-A-Cath reservoir without extravasation.  Port-A-Cath tubing connection to the reservoir outlet is intact.  Port-A-Cath tubing appears intact.  With injection of contrast, no free spillage of contrast into the SVC is seen.  Significant contrast tracks proximally along the Port-A-Cath catheter the level were the catheter crosses under the LEFT clavicle compatible with a long fibrin sheath.  Additionally, retained foci of contrast are identified cranial to the catheter in the LEFT brachiocephalic vein beginning at the midline and extending to  the LEFT several cm, likely representing retained contrast within a segment of thrombus in the LEFT brachiocephalic vein.  IMPRESSION: Long fibrin sheath causing significant obstruction of outflow from the Port-A-Cath catheter.  Retained contrast collection within the LEFT brachiocephalic vein cranial to the catheter tubing, concerning for segment of thrombus.  Findings called to Kirby Crigler PA on 10/01/2014 at 1325 hr.   Electronically Signed   By: Lavonia Dana M.D.   On: 10/01/2014 15:36    Medications: I have reviewed the patient's current medications.  Assessment/Plan: 1. Stage IV Breast Cancer, last treated on 08/14/2014. 2. Hypercalcemia of malignancy improved. Corrected calcium based off of albumin from 09/28/2014 is 12.6. S/P 90 mg of Pamidronate on 09/30/2014. May repeat as an outpatient depending on response. 3. Normochromic, normocytic anemia. Progressive. S/P 2 units of PRBCs on 09/30/2014 with appropriate response in Hgb.  4. 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 On IV antibiotics. 5. DNR 6. Left brachiocephalic DVT, starting Xarelto per pharmacy. 7. Large fibrin sheath of port a cath with "kink" of catheter. 8.  Oncologically, she is free for discharge at the attending's discretion. Outpatient appointment at [facility] is Feb 9th at 2:00 PM with Dr. Whitney Muse. Will perform labs at that time if indicated.   Patient and plan discussed with Dr. Ancil Linsey and she is in agreement with the aforementioned.     LOS: 7 days    KEFALAS,THOMAS 10/02/2014   Agree with above. All discussed with  Tom. Patient is DNR, multiple co-morbidities. Not a candidate for additional chemotherapy.  She will f/u with me as an outpatient and we will continue to address end of life issues.  Donald Pore MD

## 2014-10-02 NOTE — Progress Notes (Signed)
Occupational Therapy Treatment Patient Details Name: Erika Nunez MRN: [id number] DOB: November 07, 1950 Today's Date: 10/02/2014       OT comments  Patient in bed upon therapy arrival. Daughter present. Pt agreeable to participate in tx session. Pt was educated on HEP and completed toilet and simulated shower transfer. Pt tolerated tx session well with mild fatigue noted during UB exercises. All OT goals have been met.   Follow Up Recommendations  Home health OT    Equipment Recommendations  None recommended by OT       Precautions / Restrictions Precautions Precautions: Fall Restrictions Weight Bearing Restrictions: No       Mobility Bed Mobility Overal bed mobility: Modified Independent                Transfers Overall transfer level: Modified independent Equipment used: Rolling walker (2 wheeled) Transfers: Sit to/from Stand Sit to Stand: Supervision Stand pivot transfers: Supervision                ADL Overall ADL's : Needs assistance/impaired                         Toilet Transfer: Supervision/safety;Ambulation;Regular Toilet;Grab bars;RW       Tub/ Shower Transfer: Supervision/safety;Set up;Ambulation;Shower Technical sales engineer Details (indicate cue type and reason): simulation Functional mobility during ADLs: Supervision/safety;Rolling walker                  Cognition   Behavior During Therapy: WFL for tasks assessed/performed Overall Cognitive Status: Within Functional Limits for tasks assessed                         Exercises General Exercises - Upper Extremity Shoulder Flexion: Strengthening;Both;10 reps;Seated;Theraband Theraband Level (Shoulder Flexion): Level 2 (Red) Shoulder Extension: Strengthening;Both;10 reps;Theraband;Seated Theraband Level (Shoulder Extension): Level 2 (Red) Shoulder ABduction: Strengthening;Both;10 reps;Seated;Theraband Theraband Level (Shoulder Abduction): Level 2  (Red) Shoulder ADduction: Strengthening;Both;10 reps;Seated;Theraband Theraband Level (Shoulder Adduction): Level 2 (Red) Shoulder Horizontal ABduction: Strengthening;Both;10 reps;Seated;Theraband Theraband Level (Shoulder Horizontal Abduction): Level 2 (Red) Shoulder Horizontal ADduction: Strengthening;Both;10 reps;Seated;Theraband Theraband Level (Shoulder Horizontal Adduction): Level 2 (Red) Shoulder Exercises Shoulder External Rotation: Strengthening;Both;10 reps;Theraband;Seated Theraband Level (Shoulder External Rotation): Level 2 (Red) Other Exercises Other Exercises: Shoulder protraction; Strengthening; Both; 10X; seated; red theraband           Pertinent Vitals/ Pain       Pain Assessment: No/denies pain      Progress Toward Goals  OT Goals(current goals can now be found in the care plan section)  Progress towards OT goals: Goals met/education completed, patient discharged from [place] Discharge plan remains appropriate;All goals met and education completed, patient discharged from OT services       End of Session Equipment Utilized During Treatment: Gait belt   Activity Tolerance Patient tolerated treatment well   Patient Left in bed;with call bell/phone within reach;with family/visitor present   Nurse Communication          Time: 0820-0850 OT Time Calculation (min): 30 min  Charges: OT General Charges $OT Visit: 1 Procedure OT Treatments $Self Care/Home Management : 8-22 mins (15') $Therapeutic Exercise: 8-22 mins ([place], OTR/L,CBIS  [phone]  10/02/2014, 8:39 AM

## 2014-10-02 NOTE — Discharge Instructions (Signed)
Information on my medicine - XARELTO (rivaroxaban)  This medication education was reviewed with me or my healthcare representative as part of my discharge preparation.  The pharmacist that spoke with me during my hospital stay was:  Ena Dawley, [place]? Xarelto was prescribed to treat blood clots that may have been found in the veins of your legs (deep vein thrombosis) or in your lungs (pulmonary embolism) and to reduce the risk of them occurring again.  What do you need to know about Xarelto? The starting dose is one 15 mg tablet taken TWICE daily with food for the FIRST 21 DAYS then on (enter date)  10/22/14  the dose is changed to one 20 mg tablet taken ONCE A DAY with your evening meal.  DO NOT stop taking Xarelto without talking to the health care provider who prescribed the medication.  Refill your prescription for 20 mg tablets before you run out.  After discharge, you should have regular check-up appointments with your healthcare provider that is prescribing your Xarelto.  In the future your dose may need to be changed if your kidney function changes by a significant amount.  What do you do if you miss a dose? If you are taking Xarelto TWICE DAILY and you miss a dose, take it as soon as you remember. You may take two 15 mg tablets (total 30 mg) at the same time then resume your regularly scheduled 15 mg twice daily the next day.  If you are taking Xarelto ONCE DAILY and you miss a dose, take it as soon as you remember on the same day then continue your regularly scheduled once daily regimen the next day. Do not take two doses of Xarelto at the same time.   Important Safety Information Xarelto is a blood thinner medicine that can cause bleeding. You should call your healthcare provider right away if you experience any of the following: ? Bleeding from an injury or your nose that does not stop. ? Unusual colored urine (red or dark brown) or  unusual colored stools (red or black). ? Unusual bruising for unknown reasons. ? A serious fall or if you hit your head (even if there is no bleeding).  Some medicines may interact with Xarelto and might increase your risk of bleeding while on Xarelto. To help avoid this, consult your healthcare provider or pharmacist prior to using any new prescription or non-prescription medications, including herbals, vitamins, non-steroidal anti-inflammatory drugs (NSAIDs) and supplements.  This website has more information on Xarelto: [email or web address].[email or web address]

## 2014-10-02 NOTE — Progress Notes (Signed)
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 Patient will need a prolonged course of intravenous antibiotics of approximately 6 weeks. During this course, the case was discussed with infectious disease, Dr. Tommy Medal, who  recommended vancomycin, Rocephin and Flagyl for a total of 6 weeks. Patient was also seen by wound care recommendations made for local treatment Patient is awaiting placement of PICC for abx.  Dehydration. Continued with IV fluids  AKI on CKD stage III. Likely related to dehydration. Patient is continued on IVF and creatinine continues to improve  Stage IV breast cancer. Oncology following and at present she does not appear to be a candidate for further treatment at this time. She will follow-up with oncology to discuss further treatment options and the possibility of hospice in the outpatient setting.  Chronic diastolic congestive heart failure. Appears compensated.  Hypercalcemia, likely related to dehydration. Persists, despite IV fluids. Per oncology, treated with pamidronate  Essential hypertension. Patient was hypotensive on admission. ACE inhibitor and hydrochlorothiazide currently on hold.  Chronic pain. Continue fentanyl patch and oxycodone.  Anemia. Likely related to chronic disease. During this course, was transfused 2 units PRBC per oncology.  Left upper extremity DVT. Found on fluoroscopy study to evaluate Port-A-Cath on 1/19. With this underlying thrombus, does not appear that Port-A-Cath can be safely used. Patient will need to be started on anticoagulation and has elected Xarelto. She will need PICC line placed in her right arm to continue with intravenous antibiotics.  Code Status: DNR Family Communication: Pt in room (indicate person spoken with, relationship, and if by phone, the  number) Disposition Plan: Pending d/c home   Consultants:  Oncology  Procedures:    Antibiotics: :  Vancomycin 1/13>>  Cefepime 1/13>>1/16  Ceftriaxone 1/16>>  Flagyl 1/16>>  HPI/Subjective: No complaints. Eager to go home  Objective: Filed Vitals:   10/01/14 0606 10/01/14 1648 10/01/14 2224 10/02/14 0530  BP: 128/57 111/57 105/60 100/56  Pulse: 75 83 82 73  Temp: 98.2 F (36.8 C) 98.9 F (37.2 C) 98.2 F (36.8 C) 98 F (36.7 C)  TempSrc: Oral Oral Oral Oral  Resp: 18 18 16 18   Height:      Weight:      SpO2: 100% 98% 97% 98%    Intake/Output Summary (Last 24 hours) at 10/02/14 1156 Last data filed at 10/01/14 1724  Gross per 24 hour  Intake    490 ml  Output      0 ml  Net    490 ml   Filed Weights   09/25/14 1221  Weight: 75.297 kg (166 lb)    Exam:   General:  Awake, in nad  Cardiovascular: regular, s1, s2  Respiratory: normal resp effort, no wheezing  Abdomen: soft,nondistended  Musculoskeletal: perfused,no clubbing   Data Reviewed: Basic Metabolic Panel:  Recent Labs Lab 09/26/14 0641 09/27/14 0632 09/28/14 0555 09/29/14 0848 10/01/14 0555  NA 139 139 135 134* 135  K 4.1 4.7 4.5 4.6 4.8  CL 107 107 105 106 107  CO2 24 25 22 23 22   GLUCOSE 94 89 82 100* 93  BUN 39* 25* 20 18 18   CREATININE 2.51* 1.87* 1.70* 1.50* 1.15*  CALCIUM 11.7* 11.7* 11.3* 11.8* 11.6*   Liver Function Tests:  Recent Labs Lab 09/28/14 0555  AST 29  ALT 22  ALKPHOS 87  BILITOT 0.6  PROT  6.5  ALBUMIN 3.0*   No results for input(s): LIPASE, AMYLASE in the last 168 hours. No results for input(s): AMMONIA in the last 168 hours. CBC:  Recent Labs Lab 09/26/14 0641 09/27/14 0632 09/29/14 0848 10/01/14 0555  WBC 7.2 8.1 7.5 7.7  HGB 8.4* 8.3* 8.2* 10.9*  HCT 25.8* 26.0* 25.7* 33.0*  MCV 90.5 92.5 91.8 87.1  PLT 272 308 306 295   Cardiac Enzymes: No results for input(s): CKTOTAL, CKMB, CKMBINDEX, TROPONINI in the last 168 hours. BNP  (last 3 results) No results for input(s): PROBNP in the last 8760 hours. CBG: No results for input(s): GLUCAP in the last 168 hours.  Recent Results (from the past 240 hour(s))  Culture, blood (routine x 2)     Status: None   Collection Time: 09/25/14 10:05 AM  Result Value Ref Range Status   Specimen Description BLOOD LEFT ARM  Final   Special Requests   Final    BOTTLES DRAWN AEROBIC AND ANAEROBIC AEB=8CC ANA=10CC   Culture NO GROWTH 5 DAYS  Final   Report Status 09/30/2014 FINAL  Final  Culture, blood (routine x 2)     Status: None   Collection Time: 09/25/14 10:10 AM  Result Value Ref Range Status   Specimen Description BLOOD LEFT WRIST  Final   Special Requests BOTTLES DRAWN AEROBIC AND ANAEROBIC 6CC  Final   Culture NO GROWTH 5 DAYS  Final   Report Status 09/30/2014 FINAL  Final  Wound culture     Status: None   Collection Time: 09/25/14  3:45 PM  Result Value Ref Range Status   Specimen Description WOUND SACRAL  Final   Special Requests NONE  Final   Gram Stain   Final    FEW WBC PRESENT,BOTH PMN AND MONONUCLEAR NO SQUAMOUS EPITHELIAL CELLS SEEN FEW GRAM POSITIVE COCCI IN PAIRS FEW GRAM NEGATIVE RODS Performed at [facility]    Culture   Final    FEW STAPHYLOCOCCUS AUREUS Note: RIFAMPIN AND GENTAMICIN SHOULD NOT BE USED AS SINGLE DRUGS FOR TREATMENT OF STAPH INFECTIONS. Performed at [facility]    Report Status 09/29/2014 FINAL  Final   Organism ID, Bacteria STAPHYLOCOCCUS AUREUS  Final      Susceptibility   Staphylococcus aureus - MIC*    CLINDAMYCIN <=0.25 SENSITIVE Sensitive     ERYTHROMYCIN <=0.25 SENSITIVE Sensitive     GENTAMICIN <=0.5 SENSITIVE Sensitive     LEVOFLOXACIN 0.25 SENSITIVE Sensitive     OXACILLIN <=0.25 SENSITIVE Sensitive     PENICILLIN 0.06 SENSITIVE Sensitive     RIFAMPIN <=0.5 SENSITIVE Sensitive     TRIMETH/SULFA <=10 SENSITIVE Sensitive     VANCOMYCIN 1 SENSITIVE Sensitive     TETRACYCLINE <=1 SENSITIVE Sensitive      MOXIFLOXACIN <=0.25 SENSITIVE Sensitive     * FEW STAPHYLOCOCCUS AUREUS     Studies: Dg Fluoro Rm 1-60 Min  10/01/2014   CLINICAL DATA:  Breast cancer, nonfunctional Port-A-Cath  EXAM: FLOURO RM 1-60 MIN / PORTACATH CHECK  TECHNIQUE: Indwelling LEFT subclavian Port-A-Cath was injected with contrast and assessed.  CONTRAST:  8 mL OMNIPAQUE IOHEXOL 300 MG/ML  SOLN IV  FLUOROSCOPY TIME:  0 min 30 seconds  COMPARISON:  None  FINDINGS: Patient presented with Port-A-Cath already accessed.  Unable to obtain blood return.  Significant resistance to injection of contrast material.  Injected contrast opacifies the Port-A-Cath reservoir without extravasation.  Port-A-Cath tubing connection to the reservoir outlet is intact.  Port-A-Cath tubing appears intact.  With  injection of contrast, no free spillage of contrast into the SVC is seen.  Significant contrast tracks proximally along the Port-A-Cath catheter the level were the catheter crosses under the LEFT clavicle compatible with a long fibrin sheath.  Additionally, retained foci of contrast are identified cranial to the catheter in the LEFT brachiocephalic vein beginning at the midline and extending to the LEFT several cm, likely representing retained contrast within a segment of thrombus in the LEFT brachiocephalic vein.  IMPRESSION: Long fibrin sheath causing significant obstruction of outflow from the Port-A-Cath catheter.  Retained contrast collection within the LEFT brachiocephalic vein cranial to the catheter tubing, concerning for segment of thrombus.  Findings called to Kirby Crigler PA on 10/01/2014 at 1325 hr.   Electronically Signed   By: Lavonia Dana M.D.   On: 10/01/2014 15:36    Scheduled Meds: . alteplase  2 mg Intracatheter Once  . cefTRIAXone (ROCEPHIN)  IV  1 g Intravenous Q24H  . feeding supplement  237 mL Oral TID BM  . fentaNYL  75 mcg Transdermal Q72H  . folic acid  1 mg Oral Daily  . gabapentin  300 mg Oral BID  . metroNIDAZOLE  500  mg Oral 3 times per day  . potassium chloride SA  40 mEq Oral TID  . pravastatin  80 mg Oral QHS  . rivaroxaban  15 mg Oral BID   Followed by  . [START ON 10/22/2014] rivaroxaban  20 mg Oral Q supper  . senna  1 tablet Oral BID  . sodium chloride  3 mL Intravenous Q12H  . vancomycin  1,000 mg Intravenous Q24H    Principal Problem:   Acute renal failure superimposed on stage 3 chronic kidney disease Active Problems:   Breast cancer   Hypertension   Metastasis from breast cancer   Hypercalcemia   Physical deconditioning   Stage IV pressure ulcer of sacral region   Chronic diastolic CHF (congestive heart failure)   Constipation   HLD (hyperlipidemia)   Protein-calorie malnutrition, severe   DVT of upper extremity (deep vein thrombosis)  Time spent: 59min  [facility], [facility] Pager [phone]. If 7PM-7AM, please contact night-coverage at [email or web address], password [facility] 10/02/2014, 11:56 AM  LOS: 7 days

## 2014-10-02 NOTE — Progress Notes (Signed)
Occupational Therapy Discharge Patient Details Name: LORELEI HEIKKILA MRN: [id number] DOB: 08-26-1951 Today's Date: 10/02/2014 Time: 0820-0850 OT Time Calculation (min): 30 min  Patient discharged from OT services secondary to goals met and no further OT needs identified.  Please see latest therapy progress note for current level of functioning and progress toward goals.    Progress and discharge plan discussed with patient and/or caregiver: Patient/Caregiver agrees with plan   Ailene Ravel, OTR/L,CBIS  [phone]  10/02/2014, 8:40 AM

## 2014-10-02 NOTE — Progress Notes (Signed)
Patient's port a cath deaccessed.  Patient has no complaints of pain,  No shortness of breathe or chest pain. site wnl.

## 2014-10-03 ENCOUNTER — Ambulatory Visit (HOSPITAL_COMMUNITY): Payer: Self-pay

## 2014-10-03 ENCOUNTER — Other Ambulatory Visit (HOSPITAL_COMMUNITY): Payer: Self-pay | Admitting: Hematology & Oncology

## 2014-10-03 DIAGNOSIS — R7881 Bacteremia: Secondary | ICD-10-CM

## 2014-10-03 DIAGNOSIS — C50919 Malignant neoplasm of unspecified site of unspecified female breast: Secondary | ICD-10-CM

## 2014-10-03 DIAGNOSIS — C50911 Malignant neoplasm of unspecified site of right female breast: Secondary | ICD-10-CM

## 2014-10-03 DIAGNOSIS — C799 Secondary malignant neoplasm of unspecified site: Secondary | ICD-10-CM

## 2014-10-03 DIAGNOSIS — C7931 Secondary malignant neoplasm of brain: Secondary | ICD-10-CM

## 2014-10-03 MED ORDER — SENNA 8.6 MG PO TABS: 1.0000 | ORAL_TABLET | Freq: Two times a day (BID) | ORAL | Status: AC

## 2014-10-03 MED ORDER — OXYCODONE HCL 10 MG PO TABS: ORAL_TABLET | ORAL | Status: DC

## 2014-10-03 NOTE — Care Management Utilization Note (Signed)
UR completed 

## 2014-10-03 NOTE — Procedures (Signed)
RIJV tunelled PICC SVC RA 23 cm No comp

## 2014-10-03 NOTE — Progress Notes (Signed)
Discharge instructions reviewed with pt and no current questions or concerns. Pt made aware of any follow-up appointments that she may have, and Tammy (case management) has explained how her [facility] will work.

## 2014-10-03 NOTE — Discharge Summary (Signed)
Physician Discharge Summary  Der Gagliano Mccrae FOY:[id number] DOB: [birth year] DOA: 09/25/2014  PCP: [name], MD  Admit date: 09/25/2014 Discharge date: 10/03/2014  Time spent: 30 minutes  Recommendations for Outpatient Follow-up:  1. Follow up with PCP in 1-2 weeks 2. Follow up at [facility] on 2/9 at 2pm 3. 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 Briefly, pt presented with generalized weakness and sob on exertion. The patient was found to be in acute renal failure with sacral ulceration. The patient was admitted for further work up.  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 Patient will need a prolonged course of intravenous antibiotics of approximately 6 weeks. During this course, the case was discussed with infectious disease, Dr. Tommy Medal, who recommended vancomycin, Rocephin and Flagyl for a total of 6 weeks. Patient was also seen by wound care recommendations made for local treatment PICC placed on 10/03/14.  Dehydration. Improved with IV fluids  AKI on CKD stage III. Likely related to dehydration. Patient is continued on IVF and creatinine improved  Stage IV breast cancer. Oncology following and at present she does not  appear to be a candidate for further treatment at this time. She will follow-up with oncology to discuss further treatment options and the possibility of hospice in the outpatient setting.  Chronic diastolic congestive heart failure. Appears compensated.  Hypercalcemia, likely related to dehydration. Persists, despite IV fluids. Per oncology, treated with pamidronate  Essential hypertension. Patient was hypotensive on admission. ACE inhibitor and hydrochlorothiazide currently on hold.  Chronic pain. Continue fentanyl patch and oxycodone.  Anemia. Likely related to chronic disease. During this course, was transfused 2 units PRBC per oncology.  Left upper extremity DVT. Found on fluoroscopy study to evaluate Port-A-Cath on 1/19. With this underlying thrombus, does not appear that Port-A-Cath can be safely used. Patient started on anticoagulation and has elected Xarelto.  Procedures:  PICC placement 1/21 (i.e. Studies not automatically included, echos, thoracentesis, etc; not x-rays)  Consultations:  Oncology  Discharge Exam: Filed Vitals:   10/02/14 1402 10/02/14 2108 10/03/14 0710 10/03/14 0750  BP: 99/52 109/39 94/44 94/44   Pulse: 79 79 93 93  Temp: 98 F (36.7 C) 98.4 F (36.9 C) 97.9 F (36.6 C) 97.9 F (36.6 C)  TempSrc: Oral Oral Oral   Resp: 18 18 18 18   Height:      Weight:      SpO2: 98% 99% 97% 97%    General: awake, in nad Cardiovascular: regular, s1, s2 Respiratory: normal resp effort, no wheezing  Discharge Instructions      Discharge Instructions    Type and screen    Complete by:  Sep 30, 2014      Complete patient signature process for consent  form    Complete by:  As directed      Practitioner attestation of consent    Complete by:  As directed   I, the ordering practitioner, attest that I have discussed with the patient the benefits, risks, side effects, alternatives, likelihood of achieving goals and potential problems during recovery for the  procedure listed.  Procedure:  Blood Product(s)            Medication List    STOP taking these medications        Oxycodone HCl 10 MG Tabs      TAKE these medications        fentaNYL 75 MCG/HR  Commonly known as:  DURAGESIC - dosed mcg/hr  Place 1 patch (75 mcg total) onto the skin every 3 (three) days.     gabapentin 300 MG capsule  Commonly known as:  NEURONTIN  Take 1 capsule (300 mg total) by mouth 2 (two) times daily.     hydrochlorothiazide 25 MG tablet  Commonly known as:  HYDRODIURIL  Take 25 mg by mouth daily.     ibuprofen 200 MG tablet  Commonly known as:  ADVIL,MOTRIN  Take 200 mg by mouth every 6 (six) hours as needed. Pain.     KADCYLA IV  Inject into the vein every 21 ( twenty-one) days. To start 05/01/14     lidocaine-prilocaine cream  Commonly known as:  EMLA  Apply topically once. Apply a quarter sized amount to port site 1 hour prior to chemo. Do not rub in. Cover with plastic.     lisinopril 20 MG tablet  Commonly known as:  PRINIVIL,ZESTRIL  Take 20 mg by mouth daily.     LORazepam 1 MG tablet  Commonly known as:  ATIVAN  Take 1 tablet (1 mg total) by mouth every 4 (four) hours as needed (nausea/vomiting).     magnesium oxide 400 MG tablet  Commonly known as:  MAG-OX  Take 400 mg by mouth 3 (three) times daily.     ondansetron 8 MG tablet  Commonly known as:  ZOFRAN  Take 8 mg by mouth 2 (two) times daily as needed for nausea.     potassium chloride SA 20 MEQ tablet  Commonly known as:  K-DUR,KLOR-CON  Take 2 tablets (40 mEq total) by mouth 3 (three) times daily.     pravastatin 80 MG tablet  Commonly known as:  PRAVACHOL  Take 80 mg by mouth at bedtime.     rivaroxaban 20 MG Tabs tablet  Commonly known as:  XARELTO  Take 1 tablet (20 mg total) by mouth daily with supper.  Start taking on:  10/22/2014     senna 8.6 MG Tabs tablet  Commonly known as:  SENOKOT  Take 1 tablet (8.6 mg total) by mouth 2 (two) times daily.      sennosides-docusate sodium 8.6-50 MG tablet  Commonly known as:  SENOKOT-S  Take 2 tablets by mouth daily.       Allergies  Allergen Reactions  . Codeine Hives  . Famvir [Famciclovir]     Patient had edema from knees down while taking. When she stopped taking drug, the edema went away.   Follow-up Information    Follow up with [facility].   Contact information:   [address] [place] [address] [phone]       Follow up with [facility] On 10/22/2014.   Why:  2:00 PM appt with Dr. Whitney Muse  Contact information:   [place] [phone]       Follow up with [name], MD. Schedule an appointment as soon as possible for a visit in 1 week.   Specialty:  Family Medicine   Contact information:   [name] [place] [address] [phone]        The results of significant diagnostics from this hospitalization (including imaging, microbiology, ancillary and laboratory) are listed below for reference.    Significant Diagnostic Studies: Ct Pelvis Wo Contrast  09/26/2014   CLINICAL DATA:  Midline open wound with layer of adipose tissue exposed measuring 2 cm with white discharge, evaluate for osteomyelitis  EXAM: CT PELVIS WITHOUT CONTRAST  TECHNIQUE: Multidetector CT imaging of the pelvis was performed following the standard protocol without intravenous contrast.  COMPARISON:  09/25/2014 MRI  FINDINGS: There is a decubitus ulcer over the coccyx. Air extends from the dermis all the way down to the coccyx, 2 is subcutaneous depth of 3.1 cm. No appreciable organized fluid collection on this noncontrast study although a small abscess of less than or equal to 2 cm cannot be excluded. The tract of inflammation measures a maximum diameter of about 17 mm in width. The posterior cortex of the coccyx at this level is difficult to ascertain and therefore osteomyelitis is suspected.  Bladder and  pelvic organs appear unremarkable. There is calcification of the aorta. Appendix is normal. No free fluid in the visualized portion of the pelvis.  IMPRESSION: The findings are consistent with decubitus ulcer and are concerning for small focus of osteomyelitis involving the mid coccyx.   Electronically Signed   By: Skipper Cliche M.D.   On: 09/26/2014 10:12   Mr Pelvis Wo Contrast  09/25/2014   CLINICAL DATA:  Open wound with drainage over the coccyx. Evaluate for osteomyelitis. History of breast and brain cancer.  EXAM: MRI PELVIS WITHOUT CONTRAST  TECHNIQUE: Multiplanar multisequence MR imaging of the pelvis was performed. No intravenous contrast was administered.  COMPARISON:  PET-CT 04/01/2014.  FINDINGS: Examination is mildly degraded by motion. No intravenous contrast was administered.  There is deep soft tissue ulceration inferiorly over the inguinal crease, extending superiorly around the coccyx. There is an ill-defined approximately 3.8 x 3.7 cm focus of inflammation in this area with central low-density which probably reflects gas. Although limited by motion, the perirectal fat planes are preserved. There is no definite perirectal/perianal fistula. No intrapelvic inflammatory changes or fluid collections are seen.  Best seen on the sagittal images is T2 hyperintensity within the coccyx worrisome for early osteomyelitis. The sacrum and remainder of the visualized bony pelvis appear intact. There is no significant hip joint effusion. Both femoral heads appear normal.  Uterine fibroids are noted.  There is no adnexal mass.  IMPRESSION: 1. Decubitus ulceration over the coccyx with underlying inflammation but no well-defined abscess. No definite intrapelvic extension or associated perirectal/perianal fistula. 2. Abnormal T2 signal in the distal coccyx worrisome for osteomyelitis. 3. Given the motion limitations of this examination, CT may be helpful for further evaluation (and better tolerated by this  patient).   Electronically Signed   By: Camie Patience M.D.   On: 09/25/2014 19:47   Dg Fluoro Rm 1-60 Min  10/01/2014   CLINICAL DATA:  Breast cancer, nonfunctional Port-A-Cath  EXAM: FLOURO RM 1-60 MIN / PORTACATH CHECK  TECHNIQUE: Indwelling LEFT subclavian Port-A-Cath was injected with contrast and assessed.  CONTRAST:  8 mL OMNIPAQUE IOHEXOL 300  MG/ML  SOLN IV  FLUOROSCOPY TIME:  0 min 30 seconds  COMPARISON:  None  FINDINGS: Patient presented with Port-A-Cath already accessed.  Unable to obtain blood return.  Significant resistance to injection of contrast material.  Injected contrast opacifies the Port-A-Cath reservoir without extravasation.  Port-A-Cath tubing connection to the reservoir outlet is intact.  Port-A-Cath tubing appears intact.  With injection of contrast, no free spillage of contrast into the SVC is seen.  Significant contrast tracks proximally along the Port-A-Cath catheter the level were the catheter crosses under the LEFT clavicle compatible with a long fibrin sheath.  Additionally, retained foci of contrast are identified cranial to the catheter in the LEFT brachiocephalic vein beginning at the midline and extending to the LEFT several cm, likely representing retained contrast within a segment of thrombus in the LEFT brachiocephalic vein.  IMPRESSION: Long fibrin sheath causing significant obstruction of outflow from the Port-A-Cath catheter.  Retained contrast collection within the LEFT brachiocephalic vein cranial to the catheter tubing, concerning for segment of thrombus.  Findings called to Kirby Crigler PA on 10/01/2014 at 1325 hr.   Electronically Signed   By: Lavonia Dana M.D.   On: 10/01/2014 15:36   Ir Fluoro Guide Cv Line Right  10/03/2014   CLINICAL DATA:  Breast cancer  EXAM: RIGHT INTERNAL JUGULAR TUNNELED PICC LINE PLACEMENT WITH ULTRASOUND AND FLUOROSCOPIC GUIDANCE  FLUOROSCOPY TIME:  18 seconds.  PROCEDURE: The patient was advised of the possible risks and complications and  agreed to undergo the procedure. The patient was then brought to the angiographic suite for the procedure.  The right neck was prepped with chlorhexidine, draped in the usual sterile fashion using maximum barrier technique (cap and mask, sterile gown, sterile gloves, large sterile sheet, hand hygiene and cutaneous antisepsis) and infiltrated locally with 1% Lidocaine.  Ultrasound demonstrated patency of the right internal jugular vein, and this was documented with an image. Under real-time ultrasound guidance, this vein was accessed with a 21 gauge micropuncture needle and image documentation was performed. A 0.018 wire was introduced in to the vein. Over this, a 5 [place] double lumen tunneled power PICC was advanced to the lower SVC/right atrial junction. The cuff was positioned in the extended subcutaneous tract. Fluoroscopy during the procedure and fluoro spot radiograph confirms appropriate catheter position. The catheter was flushed and covered with a sterile dressing.  COMPLICATIONS: None.  LENGTH: 23 cm  IMPRESSION: Successful right internal jugular tunneled power PICC line placement with ultrasound and fluoroscopic guidance. The catheter is ready for use.   Electronically Signed   By: Maryclare Bean M.D.   On: 10/03/2014 12:43   Ir US Guide Vasc Access Right  10/03/2014   CLINICAL DATA:  Breast cancer  EXAM: RIGHT INTERNAL JUGULAR TUNNELED PICC LINE PLACEMENT WITH ULTRASOUND AND FLUOROSCOPIC GUIDANCE  FLUOROSCOPY TIME:  18 seconds.  PROCEDURE: The patient was advised of the possible risks and complications and agreed to undergo the procedure. The patient was then brought to the angiographic suite for the procedure.  The right neck was prepped with chlorhexidine, draped in the usual sterile fashion using maximum barrier technique (cap and mask, sterile gown, sterile gloves, large sterile sheet, hand hygiene and cutaneous antisepsis) and infiltrated locally with 1% Lidocaine.  Ultrasound demonstrated patency of  the right internal jugular vein, and this was documented with an image. Under real-time ultrasound guidance, this vein was accessed with a 21 gauge micropuncture needle and image documentation was performed. A 0.018 wire was introduced in  to the vein. Over this, a 5 [place] double lumen tunneled power PICC was advanced to the lower SVC/right atrial junction. The cuff was positioned in the extended subcutaneous tract. Fluoroscopy during the procedure and fluoro spot radiograph confirms appropriate catheter position. The catheter was flushed and covered with a sterile dressing.  COMPLICATIONS: None.  LENGTH: 23 cm  IMPRESSION: Successful right internal jugular tunneled power PICC line placement with ultrasound and fluoroscopic guidance. The catheter is ready for use.   Electronically Signed   By: Maryclare Bean M.D.   On: 10/03/2014 12:43    Microbiology: Recent Results (from the past 240 hour(s))  Culture, blood (routine x 2)     Status: None   Collection Time: 09/25/14 10:05 AM  Result Value Ref Range Status   Specimen Description BLOOD LEFT ARM  Final   Special Requests   Final    BOTTLES DRAWN AEROBIC AND ANAEROBIC AEB=8CC ANA=10CC   Culture NO GROWTH 5 DAYS  Final   Report Status 09/30/2014 FINAL  Final  Culture, blood (routine x 2)     Status: None   Collection Time: 09/25/14 10:10 AM  Result Value Ref Range Status   Specimen Description BLOOD LEFT WRIST  Final   Special Requests BOTTLES DRAWN AEROBIC AND ANAEROBIC 6CC  Final   Culture NO GROWTH 5 DAYS  Final   Report Status 09/30/2014 FINAL  Final  Wound culture     Status: None   Collection Time: 09/25/14  3:45 PM  Result Value Ref Range Status   Specimen Description WOUND SACRAL  Final   Special Requests NONE  Final   Gram Stain   Final    FEW WBC PRESENT,BOTH PMN AND MONONUCLEAR NO SQUAMOUS EPITHELIAL CELLS SEEN FEW GRAM POSITIVE COCCI IN PAIRS FEW GRAM NEGATIVE RODS Performed at [facility]    Culture   Final    FEW  STAPHYLOCOCCUS AUREUS Note: RIFAMPIN AND GENTAMICIN SHOULD NOT BE USED AS SINGLE DRUGS FOR TREATMENT OF STAPH INFECTIONS. Performed at [facility]    Report Status 09/29/2014 FINAL  Final   Organism ID, Bacteria STAPHYLOCOCCUS AUREUS  Final      Susceptibility   Staphylococcus aureus - MIC*    CLINDAMYCIN <=0.25 SENSITIVE Sensitive     ERYTHROMYCIN <=0.25 SENSITIVE Sensitive     GENTAMICIN <=0.5 SENSITIVE Sensitive     LEVOFLOXACIN 0.25 SENSITIVE Sensitive     OXACILLIN <=0.25 SENSITIVE Sensitive     PENICILLIN 0.06 SENSITIVE Sensitive     RIFAMPIN <=0.5 SENSITIVE Sensitive     TRIMETH/SULFA <=10 SENSITIVE Sensitive     VANCOMYCIN 1 SENSITIVE Sensitive     TETRACYCLINE <=1 SENSITIVE Sensitive     MOXIFLOXACIN <=0.25 SENSITIVE Sensitive     * FEW STAPHYLOCOCCUS AUREUS     Labs: Basic Metabolic Panel:  Recent Labs Lab 09/27/14 0632 09/28/14 0555 09/29/14 0848 10/01/14 0555  NA 139 135 134* 135  K 4.7 4.5 4.6 4.8  CL 107 105 106 107  CO2 25 22 23 22   GLUCOSE 89 82 100* 93  BUN 25* 20 18 18   CREATININE 1.87* 1.70* 1.50* 1.15*  CALCIUM 11.7* 11.3* 11.8* 11.6*   Liver Function Tests:  Recent Labs Lab 09/28/14 0555  AST 29  ALT 22  ALKPHOS 87  BILITOT 0.6  PROT 6.5  ALBUMIN 3.0*   No results for input(s): LIPASE, AMYLASE in the last 168 hours. No results for input(s): AMMONIA in the last 168 hours. CBC:  Recent Labs Lab 09/27/14 [phone]  09/29/14 0848 10/01/14 0555  WBC 8.1 7.5 7.7  HGB 8.3* 8.2* 10.9*  HCT 26.0* 25.7* 33.0*  MCV 92.5 91.8 87.1  PLT 308 306 295   Cardiac Enzymes: No results for input(s): CKTOTAL, CKMB, CKMBINDEX, TROPONINI in the last 168 hours. BNP: BNP (last 3 results) No results for input(s): PROBNP in the last 8760 hours. CBG: No results for input(s): GLUCAP in the last 168 hours.  Signed:  Marjarie Irion, Orpah Melter  [facility] 10/03/2014, 12:47 PM

## 2014-10-07 ENCOUNTER — Other Ambulatory Visit (HOSPITAL_COMMUNITY): Payer: Self-pay | Admitting: Oncology

## 2014-10-07 DIAGNOSIS — L89154 Pressure ulcer of sacral region, stage 4: Secondary | ICD-10-CM

## 2014-10-08 ENCOUNTER — Encounter (HOSPITAL_COMMUNITY): Payer: Self-pay

## 2014-10-08 ENCOUNTER — Encounter (HOSPITAL_BASED_OUTPATIENT_CLINIC_OR_DEPARTMENT_OTHER): Payer: Self-pay

## 2014-10-08 DIAGNOSIS — C50411 Malignant neoplasm of upper-outer quadrant of right female breast: Secondary | ICD-10-CM

## 2014-10-08 DIAGNOSIS — L89154 Pressure ulcer of sacral region, stage 4: Secondary | ICD-10-CM

## 2014-10-08 LAB — CBC WITH DIFFERENTIAL/PLATELET
BASOS PCT: 0 % (ref 0–1)
Basophils Absolute: 0 10*3/uL (ref 0.0–0.1)
Eosinophils Absolute: 0.2 10*3/uL (ref 0.0–0.7)
Eosinophils Relative: 2 % (ref 0–5)
HEMATOCRIT: 34.6 % — AB (ref 36.0–46.0)
Hemoglobin: 10.9 g/dL — ABNORMAL LOW (ref 12.0–15.0)
Lymphocytes Relative: 7 % — ABNORMAL LOW (ref 12–46)
Lymphs Abs: 0.9 10*3/uL (ref 0.7–4.0)
MCH: 28.7 pg (ref 26.0–34.0)
MCHC: 31.5 g/dL (ref 30.0–36.0)
MCV: 91.1 fL (ref 78.0–100.0)
Monocytes Absolute: 1 10*3/uL (ref 0.1–1.0)
Monocytes Relative: 8 % (ref 3–12)
NEUTROS PCT: 83 % — AB (ref 43–77)
Neutro Abs: 9.8 10*3/uL — ABNORMAL HIGH (ref 1.7–7.7)
Platelets: 298 10*3/uL (ref 150–400)
RBC: 3.8 MIL/uL — AB (ref 3.87–5.11)
RDW: 15.7 % — AB (ref 11.5–15.5)
WBC: 11.9 10*3/uL — ABNORMAL HIGH (ref 4.0–10.5)

## 2014-10-08 LAB — COMPREHENSIVE METABOLIC PANEL
ALK PHOS: 95 U/L (ref 39–117)
ALT: 27 U/L (ref 0–35)
AST: 38 U/L — ABNORMAL HIGH (ref 0–37)
Albumin: 2.9 g/dL — ABNORMAL LOW (ref 3.5–5.2)
Anion gap: 6 (ref 5–15)
BUN: 22 mg/dL (ref 6–23)
CALCIUM: 8.2 mg/dL — AB (ref 8.4–10.5)
CHLORIDE: 107 mmol/L (ref 96–112)
CO2: 20 mmol/L (ref 19–32)
Creatinine, Ser: 1.42 mg/dL — ABNORMAL HIGH (ref 0.50–1.10)
GFR calc Af Amer: 44 mL/min — ABNORMAL LOW (ref >90)
GFR, EST NON AFRICAN AMERICAN: 38 mL/min — AB (ref >90)
Glucose, Bld: 115 mg/dL — ABNORMAL HIGH (ref 70–99)
Potassium: 5.6 mmol/L — ABNORMAL HIGH (ref 3.5–5.1)
Sodium: 133 mmol/L — ABNORMAL LOW (ref 135–145)
Total Bilirubin: 0.5 mg/dL (ref 0.3–1.2)
Total Protein: 6.7 g/dL (ref 6.0–8.3)

## 2014-10-08 LAB — VANCOMYCIN, TROUGH: Vancomycin Tr: 15.5 ug/mL (ref 10.0–20.0)

## 2014-10-08 MED ORDER — SODIUM CHLORIDE 0.9 % IJ SOLN
10.0000 mL | INTRAMUSCULAR | Status: DC | PRN
Start: 2014-10-08 — End: 2014-10-08
  Administered 2014-10-08: 10 mL via INTRAVENOUS
  Filled 2014-10-08: qty 10

## 2014-10-08 MED ORDER — HEPARIN SOD (PORK) LOCK FLUSH 100 UNIT/ML IV SOLN
500.0000 [IU] | Freq: Once | INTRAVENOUS | Status: AC
  Administered 2014-10-08: 500 [IU] via INTRAVENOUS
  Filled 2014-10-08: qty 5

## 2014-10-08 NOTE — Progress Notes (Signed)
Erika Nunez presented for Portacath access and flush. IJ catheter site cleaned, dressing change and labs drawn. Good blood return present. Portacath flushed with 69ml NS and 250 Units l Heparin each lumen. Procedure without incident. Patient tolerated procedure well.

## 2014-10-10 ENCOUNTER — Encounter (HOSPITAL_COMMUNITY): Payer: Self-pay | Admitting: Oncology

## 2014-10-10 ENCOUNTER — Other Ambulatory Visit (HOSPITAL_COMMUNITY): Payer: Self-pay | Admitting: Oncology

## 2014-10-10 DIAGNOSIS — R112 Nausea with vomiting, unspecified: Secondary | ICD-10-CM

## 2014-10-10 DIAGNOSIS — C50919 Malignant neoplasm of unspecified site of unspecified female breast: Secondary | ICD-10-CM

## 2014-10-10 DIAGNOSIS — C799 Secondary malignant neoplasm of unspecified site: Secondary | ICD-10-CM

## 2014-10-10 DIAGNOSIS — C7931 Secondary malignant neoplasm of brain: Secondary | ICD-10-CM

## 2014-10-10 MED ORDER — LORAZEPAM 1 MG PO TABS: 1.0000 mg | ORAL_TABLET | ORAL | Status: DC | PRN

## 2014-10-15 ENCOUNTER — Encounter (HOSPITAL_COMMUNITY): Payer: Self-pay | Attending: Hematology and Oncology

## 2014-10-15 ENCOUNTER — Encounter (HOSPITAL_COMMUNITY): Payer: Self-pay

## 2014-10-15 VITALS — BP 96/54 | HR 112

## 2014-10-15 DIAGNOSIS — C50911 Malignant neoplasm of unspecified site of right female breast: Secondary | ICD-10-CM | POA: Insufficient documentation

## 2014-10-15 DIAGNOSIS — Z79899 Other long term (current) drug therapy: Secondary | ICD-10-CM | POA: Insufficient documentation

## 2014-10-15 DIAGNOSIS — I10 Essential (primary) hypertension: Secondary | ICD-10-CM | POA: Insufficient documentation

## 2014-10-15 DIAGNOSIS — C7931 Secondary malignant neoplasm of brain: Secondary | ICD-10-CM | POA: Insufficient documentation

## 2014-10-15 DIAGNOSIS — Z452 Encounter for adjustment and management of vascular access device: Secondary | ICD-10-CM

## 2014-10-15 DIAGNOSIS — Z171 Estrogen receptor negative status [ER-]: Secondary | ICD-10-CM | POA: Insufficient documentation

## 2014-10-15 DIAGNOSIS — I890 Lymphedema, not elsewhere classified: Secondary | ICD-10-CM | POA: Insufficient documentation

## 2014-10-15 DIAGNOSIS — Z9049 Acquired absence of other specified parts of digestive tract: Secondary | ICD-10-CM | POA: Insufficient documentation

## 2014-10-15 DIAGNOSIS — C50919 Malignant neoplasm of unspecified site of unspecified female breast: Secondary | ICD-10-CM

## 2014-10-15 DIAGNOSIS — Z923 Personal history of irradiation: Secondary | ICD-10-CM | POA: Insufficient documentation

## 2014-10-15 DIAGNOSIS — C7989 Secondary malignant neoplasm of other specified sites: Secondary | ICD-10-CM | POA: Insufficient documentation

## 2014-10-15 DIAGNOSIS — L89154 Pressure ulcer of sacral region, stage 4: Secondary | ICD-10-CM

## 2014-10-15 DIAGNOSIS — G629 Polyneuropathy, unspecified: Secondary | ICD-10-CM | POA: Insufficient documentation

## 2014-10-15 DIAGNOSIS — Z79891 Long term (current) use of opiate analgesic: Secondary | ICD-10-CM | POA: Insufficient documentation

## 2014-10-15 DIAGNOSIS — C50411 Malignant neoplasm of upper-outer quadrant of right female breast: Secondary | ICD-10-CM

## 2014-10-15 DIAGNOSIS — Z9013 Acquired absence of bilateral breasts and nipples: Secondary | ICD-10-CM | POA: Insufficient documentation

## 2014-10-15 DIAGNOSIS — Z9221 Personal history of antineoplastic chemotherapy: Secondary | ICD-10-CM | POA: Insufficient documentation

## 2014-10-15 DIAGNOSIS — C799 Secondary malignant neoplasm of unspecified site: Secondary | ICD-10-CM

## 2014-10-15 LAB — CBC WITH DIFFERENTIAL/PLATELET
BASOS PCT: 0 % (ref 0–1)
Basophils Absolute: 0 10*3/uL (ref 0.0–0.1)
Eosinophils Absolute: 0.2 10*3/uL (ref 0.0–0.7)
Eosinophils Relative: 3 % (ref 0–5)
HEMATOCRIT: 37.4 % (ref 36.0–46.0)
HEMOGLOBIN: 11.8 g/dL — AB (ref 12.0–15.0)
LYMPHS ABS: 0.9 10*3/uL (ref 0.7–4.0)
Lymphocytes Relative: 12 % (ref 12–46)
MCH: 29.1 pg (ref 26.0–34.0)
MCHC: 31.6 g/dL (ref 30.0–36.0)
MCV: 92.1 fL (ref 78.0–100.0)
Monocytes Absolute: 0.7 10*3/uL (ref 0.1–1.0)
Monocytes Relative: 10 % (ref 3–12)
Neutro Abs: 5.5 10*3/uL (ref 1.7–7.7)
Neutrophils Relative %: 75 % (ref 43–77)
Platelets: 361 10*3/uL (ref 150–400)
RBC: 4.06 MIL/uL (ref 3.87–5.11)
RDW: 15.4 % (ref 11.5–15.5)
WBC: 7.4 10*3/uL (ref 4.0–10.5)

## 2014-10-15 LAB — COMPREHENSIVE METABOLIC PANEL
ALT: 27 U/L (ref 0–35)
ANION GAP: 7 (ref 5–15)
AST: 36 U/L (ref 0–37)
Albumin: 3 g/dL — ABNORMAL LOW (ref 3.5–5.2)
Alkaline Phosphatase: 109 U/L (ref 39–117)
BUN: 21 mg/dL (ref 6–23)
CALCIUM: 9.5 mg/dL (ref 8.4–10.5)
CO2: 25 mmol/L (ref 19–32)
CREATININE: 1.22 mg/dL — AB (ref 0.50–1.10)
Chloride: 106 mmol/L (ref 96–112)
GFR calc Af Amer: 53 mL/min — ABNORMAL LOW (ref >90)
GFR calc non Af Amer: 46 mL/min — ABNORMAL LOW (ref >90)
Glucose, Bld: 100 mg/dL — ABNORMAL HIGH (ref 70–99)
Potassium: 5.2 mmol/L — ABNORMAL HIGH (ref 3.5–5.1)
SODIUM: 138 mmol/L (ref 135–145)
TOTAL PROTEIN: 6.9 g/dL (ref 6.0–8.3)
Total Bilirubin: 0.5 mg/dL (ref 0.3–1.2)

## 2014-10-15 LAB — VANCOMYCIN, TROUGH: VANCOMYCIN TR: 8.3 ug/mL — AB (ref 10.0–20.0)

## 2014-10-15 MED ORDER — FENTANYL 100 MCG/HR TD PT72: 100.0000 ug | MEDICATED_PATCH | TRANSDERMAL | Status: DC

## 2014-10-15 MED ORDER — HEPARIN SOD (PORK) LOCK FLUSH 100 UNIT/ML IV SOLN
500.0000 [IU] | Freq: Once | INTRAVENOUS | Status: AC
  Administered 2014-10-15: 300 [IU] via INTRAVENOUS

## 2014-10-15 MED ORDER — FENTANYL 50 MCG/HR TD PT72: 50.0000 ug | MEDICATED_PATCH | TRANSDERMAL | Status: DC

## 2014-10-15 MED ORDER — HEPARIN SOD (PORK) LOCK FLUSH 100 UNIT/ML IV SOLN
INTRAVENOUS | Status: AC
  Filled 2014-10-15: qty 5

## 2014-10-15 MED ORDER — SODIUM CHLORIDE 0.9 % IJ SOLN
10.0000 mL | INTRAMUSCULAR | Status: DC | PRN
Start: 2014-10-15 — End: 2014-10-15
  Administered 2014-10-15 (×2): 10 mL via INTRAVENOUS
  Filled 2014-10-15 (×2): qty 10

## 2014-10-15 MED ORDER — OXYCODONE HCL 10 MG PO TABS: ORAL_TABLET | ORAL | Status: DC

## 2014-10-15 NOTE — Progress Notes (Signed)
Erika Nunez presented for PICC dressing change, lab draw, and flush.  See IV assessment in docflowsheets for PICC details.  Proper placement of PICC confirmed by CXR.  PICC located right jugular.  Good blood return present. PICC flushed with 57ml NS and 250U Heparin, see MAR for further details.  Erika Nunez tolerated procedure well and without incident.

## 2014-10-15 NOTE — Patient Instructions (Signed)
[place] at [facility]  Discharge Instructions:  You had your PICC line dressing changed, blood drawn, and flushed.  Follow up as scheduled, and call the clinic if you have any questions or concerns _______________________________________________________________  Thank you for choosing [place] at [facility] to provide your oncology and hematology care.  To afford each patient quality time with our providers, please arrive at least 15 minutes before your scheduled appointment.  You need to re-schedule your appointment if you arrive 10 or more minutes late.  We strive to give you quality time with our providers, and arriving late affects you and other patients whose appointments are after yours.  Also, if you no show three or more times for appointments you may be dismissed from the clinic.  Again, thank you for choosing [place] at [place] hope is that these requests will allow you access to exceptional care and in a timely manner. _______________________________________________________________  If you have questions after your visit, please contact our office at [phone] between the hours of 8:30 a.m. and 5:00 p.m. Voicemails left after 4:30 p.m. will not be returned until the following business day. _______________________________________________________________  For prescription refill requests, have your pharmacy contact our office. _______________________________________________________________  Recommendations made by the consultant and any test results will be sent to your referring physician. _______________________________________________________________

## 2014-10-16 ENCOUNTER — Inpatient Hospital Stay (HOSPITAL_COMMUNITY): Payer: Self-pay

## 2014-10-22 ENCOUNTER — Telehealth (HOSPITAL_COMMUNITY): Payer: Self-pay | Admitting: Oncology

## 2014-10-22 ENCOUNTER — Encounter (HOSPITAL_COMMUNITY): Payer: Self-pay | Attending: Hematology & Oncology

## 2014-10-22 ENCOUNTER — Encounter (HOSPITAL_COMMUNITY): Payer: Self-pay | Attending: Hematology & Oncology | Admitting: Hematology & Oncology

## 2014-10-22 ENCOUNTER — Encounter (HOSPITAL_COMMUNITY): Payer: Self-pay | Admitting: Lab

## 2014-10-22 VITALS — BP 115/62 | HR 77 | Temp 98.8°F | Resp 20

## 2014-10-22 DIAGNOSIS — L89154 Pressure ulcer of sacral region, stage 4: Secondary | ICD-10-CM | POA: Insufficient documentation

## 2014-10-22 DIAGNOSIS — C50919 Malignant neoplasm of unspecified site of unspecified female breast: Secondary | ICD-10-CM

## 2014-10-22 DIAGNOSIS — C799 Secondary malignant neoplasm of unspecified site: Secondary | ICD-10-CM

## 2014-10-22 DIAGNOSIS — C50911 Malignant neoplasm of unspecified site of right female breast: Secondary | ICD-10-CM | POA: Insufficient documentation

## 2014-10-22 DIAGNOSIS — Z452 Encounter for adjustment and management of vascular access device: Secondary | ICD-10-CM | POA: Insufficient documentation

## 2014-10-22 DIAGNOSIS — C7931 Secondary malignant neoplasm of brain: Secondary | ICD-10-CM | POA: Insufficient documentation

## 2014-10-22 DIAGNOSIS — G893 Neoplasm related pain (acute) (chronic): Secondary | ICD-10-CM

## 2014-10-22 LAB — COMPREHENSIVE METABOLIC PANEL
ALK PHOS: 103 U/L (ref 39–117)
ALT: 32 U/L (ref 0–35)
AST: 44 U/L — ABNORMAL HIGH (ref 0–37)
Albumin: 3 g/dL — ABNORMAL LOW (ref 3.5–5.2)
Anion gap: 6 (ref 5–15)
BUN: 18 mg/dL (ref 6–23)
CHLORIDE: 106 mmol/L (ref 96–112)
CO2: 23 mmol/L (ref 19–32)
CREATININE: 1.37 mg/dL — AB (ref 0.50–1.10)
Calcium: 9.5 mg/dL (ref 8.4–10.5)
GFR calc Af Amer: 46 mL/min — ABNORMAL LOW (ref >90)
GFR, EST NON AFRICAN AMERICAN: 40 mL/min — AB (ref >90)
Glucose, Bld: 126 mg/dL — ABNORMAL HIGH (ref 70–99)
POTASSIUM: 5.1 mmol/L (ref 3.5–5.1)
Sodium: 135 mmol/L (ref 135–145)
Total Bilirubin: 0.5 mg/dL (ref 0.3–1.2)
Total Protein: 6.9 g/dL (ref 6.0–8.3)

## 2014-10-22 LAB — CBC WITH DIFFERENTIAL/PLATELET
BASOS PCT: 0 % (ref 0–1)
Basophils Absolute: 0.1 10*3/uL (ref 0.0–0.1)
EOS ABS: 0.3 10*3/uL (ref 0.0–0.7)
Eosinophils Relative: 2 % (ref 0–5)
HCT: 34.9 % — ABNORMAL LOW (ref 36.0–46.0)
Hemoglobin: 11 g/dL — ABNORMAL LOW (ref 12.0–15.0)
Lymphocytes Relative: 8 % — ABNORMAL LOW (ref 12–46)
Lymphs Abs: 1.1 10*3/uL (ref 0.7–4.0)
MCH: 29.1 pg (ref 26.0–34.0)
MCHC: 31.5 g/dL (ref 30.0–36.0)
MCV: 92.3 fL (ref 78.0–100.0)
Monocytes Absolute: 1.3 10*3/uL — ABNORMAL HIGH (ref 0.1–1.0)
Monocytes Relative: 10 % (ref 3–12)
NEUTROS PCT: 80 % — AB (ref 43–77)
Neutro Abs: 10.1 10*3/uL — ABNORMAL HIGH (ref 1.7–7.7)
PLATELETS: 435 10*3/uL — AB (ref 150–400)
RBC: 3.78 MIL/uL — ABNORMAL LOW (ref 3.87–5.11)
RDW: 15.5 % (ref 11.5–15.5)
WBC: 12.8 10*3/uL — ABNORMAL HIGH (ref 4.0–10.5)

## 2014-10-22 LAB — VANCOMYCIN, TROUGH: VANCOMYCIN TR: 12.3 ug/mL (ref 10.0–20.0)

## 2014-10-22 MED ORDER — FENTANYL 100 MCG/HR TD PT72: MEDICATED_PATCH | TRANSDERMAL | Status: DC

## 2014-10-22 MED ORDER — HEPARIN SOD (PORK) LOCK FLUSH 100 UNIT/ML IV SOLN
500.0000 [IU] | Freq: Once | INTRAVENOUS | Status: AC
  Administered 2014-10-22: 250 [IU] via INTRAVENOUS

## 2014-10-22 MED ORDER — SODIUM CHLORIDE 0.9 % IJ SOLN
10.0000 mL | Freq: Once | INTRAMUSCULAR | Status: AC
  Administered 2014-10-22: 10 mL via INTRAVENOUS

## 2014-10-22 MED ORDER — HYDROMORPHONE HCL 1 MG/ML IJ SOLN
INTRAMUSCULAR | Status: AC
  Filled 2014-10-22: qty 4

## 2014-10-22 MED ORDER — HEPARIN SOD (PORK) LOCK FLUSH 100 UNIT/ML IV SOLN
INTRAVENOUS | Status: AC
  Filled 2014-10-22: qty 5

## 2014-10-22 MED ORDER — HYDROMORPHONE HCL 1 MG/ML IJ SOLN
4.0000 mg | INTRAMUSCULAR | Status: AC | PRN
  Administered 2014-10-22: 4 mg via INTRAVENOUS

## 2014-10-22 NOTE — Telephone Encounter (Signed)
Discussed situation with Ivin Booty.  The patient has [place] Medicaid but is staying in [place] with [place]. With this, she has options including inpatient hospice or the patient moving back to her home in [place] and daughter move in with her for the time being.  She has chosen the latter option.  Patient and plan discussed with Dr. Ancil Linsey and she is in agreement with the aforementioned.   KEFALAS,THOMAS 10/22/2014

## 2014-10-22 NOTE — Progress Notes (Signed)
Erika Nunez presented for PICC line flush. Proper placement of PICC confirmed by CXR. Double lumen PICC line located right upper chest. . Good blood return present. PICC line flushed with 59ml NS and 300U/1ml Heparin each lumen. PICC line dressing change done. Procedure without incident. Patient tolerated procedure well.  MD ordered dilaudid IV push for patient prior to leaving to comfort patient while riding in car back to [place]. Patient is accompanied by her daughter. Patient did not stay for pain eval/reassessment.

## 2014-10-22 NOTE — Progress Notes (Signed)
Erika Sax, MD [name] [place] [address]    DIAGNOSIS: Breast cancer   Staging form: Breast, AJCC 7th Edition     Clinical: Stage IIIC/Stage IV - Signed by Baird Cancer, PA on 10/23/2012   SUMMARY OF ONCOLOGIC HISTORY:   Breast cancer   10/02/2012 Initial Diagnosis Breast cancer, Her2+, ER-, PR-   10/23/2012 - 02/12/2013 Chemotherapy TCH x 6 cycles   03/05/2013 - 11/05/2013 Chemotherapy Herceptin/Perjeta   06/04/2013 - 04/24/2014 Chemotherapy Arimidex daily   02/20/2014 Relapse/Recurrence CT head- New mass lesion involving the right cerebellar hemisphere with associated vasogenic edema   02/25/2014 Definitive Surgery 1. Suboccipital craniectomy 2. Resection of tumor   3. Use of intraoperative microscope for microdissection    02/25/2014 Pathology Results 1. Brain, for tumor resection, Right cerebellar- metastatic carcinoma 2. Brain, for tumor resection, Right cerebellar- metastatic carcinoma. 3. Brain, for tumor resection, Right cerebellar- metastatic carcinoma.  HER2-. ER/PR 0%, Ki-67 92%.   04/01/2014 Relapse/Recurrence PET- Marked hypermetabolism associated with the abnormal and irregular thickening of the skin over the mid and lower right lateral and post for lateral chest wall.   04/15/2014 Procedure Soft tissue mass, biopsy, right chest wall by Dr. Marlou Starks showing invasive carcinoma with LVI.  HER2+, ER 2%, PR 0%, Ki-67 87%.   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    CURRENT THERAPY: IV ABX, Palliative  INTERVAL HISTORY:  Erika Nunez 64 y.o. female returns for follow up of stage IV breast cancer. She has been in the hospital for acute renal failure, decubitus ulcer, increasing pain, and progressive cancer. Her malignancy heavily involves the skin of the right chest and flank. She is staying with her daughter who is  her principal caretaker. Her daughter is an excellent caregiver but it is becoming more challenging to care for Genene on her own.    MEDICAL HISTORY: Past Medical History  Diagnosis Date  . Hypertension   . High cholesterol   . Cancer   . Breast cancer   . Allergy     seasonal allergies  . Arthritis   . Anxiety   . Breast mass, left 2014  . Breast mass, right 2014  . 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 Depression   . Lymphedema of arm     right arm    has Breast cancer; Hypertension; Thrombocytopenia, unspecified; Antineoplastic chemotherapy induced pancytopenia; Lymphedema of upper extremity following lymphadenectomy; Delayed postoperative wound closure; Obstructive hydrocephalus; Vasogenic brain edema; Cerebellar mass; Metastatic cancer to brain; Metastasis from breast cancer; Hypercalcemia; Physical deconditioning; Stage IV pressure ulcer of sacral region; Acute renal failure superimposed on stage 3 chronic kidney disease; Chronic diastolic CHF (congestive heart failure); Constipation; HLD (hyperlipidemia); Protein-calorie malnutrition, severe; and DVT of upper extremity (deep vein thrombosis) on her problem list.     is allergic to codeine and famvir.  We administered HYDROmorphone, sodium chloride, heparin lock flush, sodium chloride, heparin lock flush, sodium chloride, and sodium chloride.  SURGICAL HISTORY: Past Surgical History  Procedure Laterality Date  . Cholecystectomy    . Tubal ligation    . Tonsillectomy    . Breast biopsy  10/02/2012    Procedure: BREAST BIOPSY;  Surgeon: Donato Heinz, MD;  Location: [facility];  Service: General;  Laterality: Right;  . Portacath placement Left 10/20/2012  . Breast biopsy  Left 10/20/2012    Procedure: BREAST BIOPSY;  Surgeon: Donato Heinz, MD;  Location: [facility];  Service: General;  Laterality: Left;  Procedure end 1142  . Portacath placement Left 10/20/2012    Procedure: INSERTION PORT-A-CATH;  Surgeon:  Donato Heinz, MD;  Location: [facility];  Service: General;  Laterality: Left;  Procedure began 3382; left subclavian  . Simple mastectomy with axillary sentinel node biopsy Bilateral 04/25/2013    Procedure: SIMPLE MASTECTOMY;  Surgeon: Donato Heinz, MD;  Location: [facility];  Service: General;  Laterality: Bilateral;  . Craniotomy Right 02/26/2014    Procedure: suboccipital craniotomy for tumor resection;  Surgeon: Consuella Lose, MD;  Location: [facility];  Service: Neurosurgery;  Laterality: Right;  suboccipital craniotomy for tumor resection  . Skin biopsy Right 04/15/2014    right lateral chest    SOCIAL HISTORY: History   Social History  . Marital Status: Widowed    Spouse Name: N/A  . Number of Children: N/A  . Years of Education: N/A   Occupational History  . Not on file.   Social History Main Topics  . Smoking status: Never Smoker   . Smokeless tobacco: Never Used  . Alcohol Use: No  . Drug Use: No  . Sexual Activity: No   Other Topics Concern  . Not on file   Social History Narrative    FAMILY HISTORY: Family History  Problem Relation Age of Onset  . Heart attack Father   . Cancer Maternal Aunt     colon cancer  . Stroke Maternal Uncle   . Cancer Maternal Grandmother     colon cancer  . Cancer Maternal Aunt     lung cancer  . COPD Daughter     Review of Systems  Constitutional: Positive for weight loss and malaise/fatigue.  HENT: Negative.   Eyes: Negative.   Respiratory: Positive for shortness of breath.   Cardiovascular: Negative.   Gastrointestinal: Negative.   Genitourinary: Negative.   Musculoskeletal: Positive for back pain and joint pain.  Skin:       See HPI  Endo/Heme/Allergies: Negative.   Psychiatric/Behavioral: Positive for depression.    PHYSICAL EXAMINATION  ECOG PERFORMANCE STATUS: 3 - Symptomatic, >50% confined to bed  Filed Vitals:   10/22/14 1347  BP: 115/62  Pulse: 77  Temp: 98.8 F (37.1 C)  Resp: 20     Physical Exam  Constitutional: She is oriented to person, place, and time.  In wheelchair, appears fatigued  HENT:  Head: Normocephalic.  Mouth/Throat: Oropharynx is clear and moist. No oropharyngeal exudate.  Eyes: Conjunctivae are normal. Pupils are equal, round, and reactive to light. No scleral icterus.  Neck: Normal range of motion. No tracheal deviation present. No thyromegaly present.  Cardiovascular: Normal rate.   No murmur heard. Pulmonary/Chest: Effort normal. She has no wheezes. She has no rales.  Abdominal: Soft. Bowel sounds are normal.  Musculoskeletal: She exhibits edema.  Lymphadenopathy:    She has no cervical adenopathy.  Neurological: She is alert and oriented to person, place, and time.  Skin: Skin is warm.  Extensive carcinoma involving the R chest wall, flank and abdomen. Erythematous, nodular and diffuse.   Psychiatric: Memory and affect normal.    LABORATORY DATA:  CBC    Component Value Date/Time   WBC 12.8* 10/22/2014 1336   RBC 3.78* 10/22/2014 1336   RBC 2.53* 09/26/2014 1053   HGB 11.0* 10/22/2014 1336   HCT 34.9* 10/22/2014 1336   PLT 435* 10/22/2014  1336   MCV 92.3 10/22/2014 1336   MCH 29.1 10/22/2014 1336   MCHC 31.5 10/22/2014 1336   RDW 15.5 10/22/2014 1336   LYMPHSABS 1.1 10/22/2014 1336   MONOABS 1.3* 10/22/2014 1336   EOSABS 0.3 10/22/2014 1336   BASOSABS 0.1 10/22/2014 1336   CMP     Component Value Date/Time   NA 135 10/22/2014 1336   K 5.1 10/22/2014 1336   CL 106 10/22/2014 1336   CO2 23 10/22/2014 1336   GLUCOSE 126* 10/22/2014 1336   BUN 18 10/22/2014 1336   CREATININE 1.37* 10/22/2014 1336   CALCIUM 9.5 10/22/2014 1336   PROT 6.9 10/22/2014 1336   ALBUMIN 3.0* 10/22/2014 1336   AST 44* 10/22/2014 1336   ALT 32 10/22/2014 1336   ALKPHOS 103 10/22/2014 1336   BILITOT 0.5 10/22/2014 1336   GFRNONAA 40* 10/22/2014 1336   GFRAA 46* 10/22/2014 1336     ASSESSMENT and THERAPY PLAN:    Breast  cancer 64 year old female with stage IV ER negative HER-2 positive carcinoma of the breast, she has extensive skin involvement of the right chest wall right flank and right upper abdomen. Disease is progressive, nodular, and causing her significant discomfort. She is dependent upon her daughter for her care. She spends the vast majority of her time in bed. Her appetite is declining. Her performance status is a 3. I discussed with the patient and her daughter my recommendations for hospice. We have addressed this on multiple other occasions. They are much more open to it today. I discussed the benefits of hospice including social and emotional support, better pain control, and better symptom management in general. Her daughter lives in [place], we will refer them to hospice that we will be able to come to her home. I have made changes in pain medications today, and advised the patient and her daughter we will try to have hospice available to them as soon as possible. If they have additional questions I have advised them to call. Plan is as detailed.     All questions were answered. The patient knows to call the clinic with any problems, questions or concerns. We can certainly see the patient much sooner if necessary. Molli Hazard 11/08/2014

## 2014-10-22 NOTE — Progress Notes (Signed)
Referral made to [facility] on 2/9.  Records faxed also.

## 2014-10-28 ENCOUNTER — Other Ambulatory Visit (HOSPITAL_COMMUNITY): Payer: Self-pay | Admitting: *Deleted

## 2014-10-28 ENCOUNTER — Other Ambulatory Visit (HOSPITAL_COMMUNITY): Payer: Self-pay | Admitting: Oncology

## 2014-10-28 DIAGNOSIS — C7931 Secondary malignant neoplasm of brain: Secondary | ICD-10-CM

## 2014-10-28 DIAGNOSIS — C50919 Malignant neoplasm of unspecified site of unspecified female breast: Secondary | ICD-10-CM

## 2014-10-28 DIAGNOSIS — L89154 Pressure ulcer of sacral region, stage 4: Secondary | ICD-10-CM

## 2014-10-28 DIAGNOSIS — C799 Secondary malignant neoplasm of unspecified site: Secondary | ICD-10-CM

## 2014-10-28 DIAGNOSIS — C50911 Malignant neoplasm of unspecified site of right female breast: Secondary | ICD-10-CM

## 2014-10-28 MED ORDER — OXYCODONE HCL 10 MG PO TABS: ORAL_TABLET | ORAL | Status: AC

## 2014-10-31 ENCOUNTER — Telehealth (HOSPITAL_COMMUNITY): Payer: Self-pay

## 2014-10-31 NOTE — Telephone Encounter (Signed)
Requested call back to let us know if Erika Nunez is with Hospice at this time.

## 2014-11-07 ENCOUNTER — Encounter (HOSPITAL_COMMUNITY): Payer: Self-pay | Admitting: Oncology

## 2014-11-07 ENCOUNTER — Other Ambulatory Visit (HOSPITAL_COMMUNITY): Payer: Self-pay | Admitting: Oncology

## 2014-11-07 DIAGNOSIS — L89154 Pressure ulcer of sacral region, stage 4: Secondary | ICD-10-CM

## 2014-11-07 DIAGNOSIS — C7931 Secondary malignant neoplasm of brain: Secondary | ICD-10-CM

## 2014-11-07 DIAGNOSIS — C50911 Malignant neoplasm of unspecified site of right female breast: Secondary | ICD-10-CM

## 2014-11-07 DIAGNOSIS — C50919 Malignant neoplasm of unspecified site of unspecified female breast: Secondary | ICD-10-CM

## 2014-11-07 DIAGNOSIS — C799 Secondary malignant neoplasm of unspecified site: Secondary | ICD-10-CM

## 2014-11-07 MED ORDER — MORPHINE SULFATE (CONCENTRATE) 20 MG/ML PO SOLN: 5.0000 mg | ORAL | Status: AC | PRN

## 2014-11-07 MED ORDER — FENTANYL 100 MCG/HR TD PT72: MEDICATED_PATCH | TRANSDERMAL | Status: AC

## 2014-11-08 ENCOUNTER — Encounter (HOSPITAL_COMMUNITY): Payer: Self-pay | Admitting: Hematology & Oncology

## 2014-11-08 NOTE — Assessment & Plan Note (Signed)
64 year old female with stage IV ER negative HER-2 positive carcinoma of the breast, she has extensive skin involvement of the right chest wall right flank and right upper abdomen. Disease is progressive, nodular, and causing her significant discomfort. She is dependent upon her daughter for her care. She spends the vast majority of her time in bed. Her appetite is declining. Her performance status is a 3. I discussed with the patient and her daughter my recommendations for hospice. We have addressed this on multiple other occasions. They are much more open to it today. I discussed the benefits of hospice including social and emotional support, better pain control, and better symptom management in general. Her daughter lives in [place], we will refer them to hospice that we will be able to come to her home. I have made changes in pain medications today, and advised the patient and her daughter we will try to have hospice available to them as soon as possible. If they have additional questions I have advised them to call. Plan is as detailed.

## 2014-11-11 ENCOUNTER — Other Ambulatory Visit (HOSPITAL_COMMUNITY): Payer: Self-pay | Admitting: Oncology

## 2014-11-11 DIAGNOSIS — C50919 Malignant neoplasm of unspecified site of unspecified female breast: Secondary | ICD-10-CM

## 2014-11-11 DIAGNOSIS — C7931 Secondary malignant neoplasm of brain: Secondary | ICD-10-CM

## 2014-11-11 DIAGNOSIS — C799 Secondary malignant neoplasm of unspecified site: Secondary | ICD-10-CM

## 2014-11-11 MED ORDER — LORAZEPAM 2 MG/ML PO CONC: 1.0000 mg | Freq: Four times a day (QID) | ORAL | Status: AC | PRN

## 2014-11-11 MED ORDER — LORAZEPAM 2 MG/ML PO CONC: 1.0000 mg | Freq: Four times a day (QID) | ORAL | Status: DC | PRN

## 2014-12-13 DEATH — deceased

## 2015-09-10 ENCOUNTER — Other Ambulatory Visit: Payer: Self-pay | Admitting: Nurse Practitioner

## 2016-02-20 IMAGING — CT CT HEAD W/O CM
2 series · 16 of 30 positions shown, 18 images · non-contrast
Comparison: PET-CT 03/13/2013.  Head CT 09/03/2005.

CLINICAL DATA: Dizziness with blurred vision. History of breast
cancer.

EXAM:
CT HEAD WITHOUT CONTRAST
TECHNIQUE: Contiguous axial images were obtained from the base of the skull
through the vertex without intravenous contrast.

[Series 201: head w/o, idose (1) · axial · non-contrast · 0.49mm/px · z∈[+80,+200]mm · 8 of 32 slices shown, 10 images]
[im 4/32  brain]
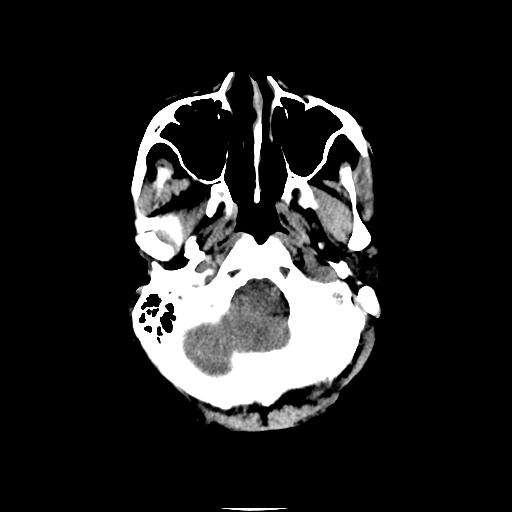
[im 4/32  bone]
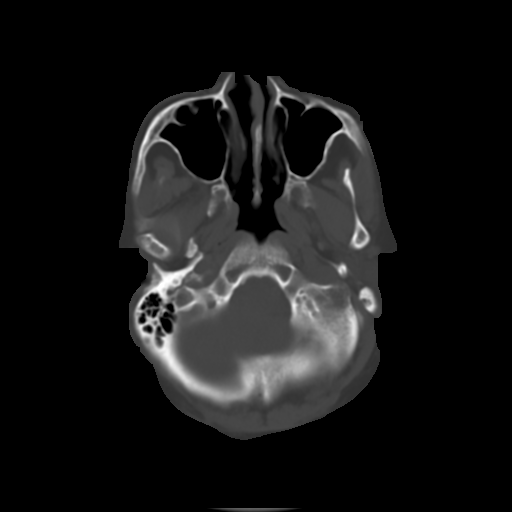
[im 7/32  brain]
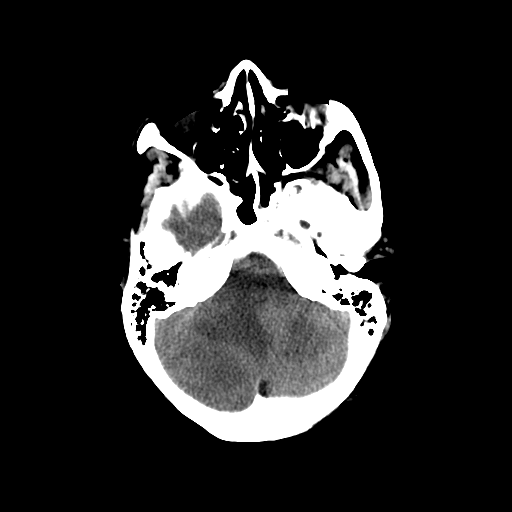
[im 11/32  brain]
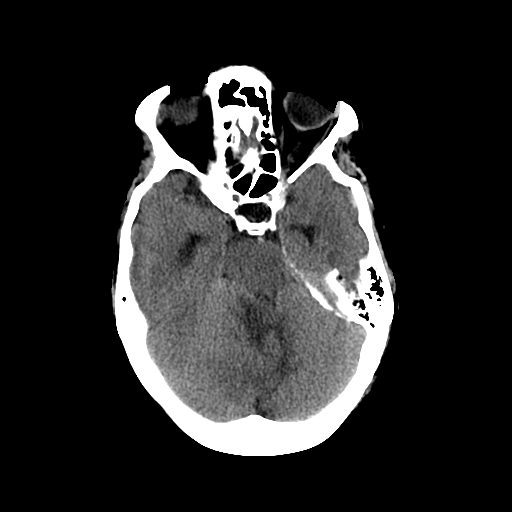
[im 14/32  brain]
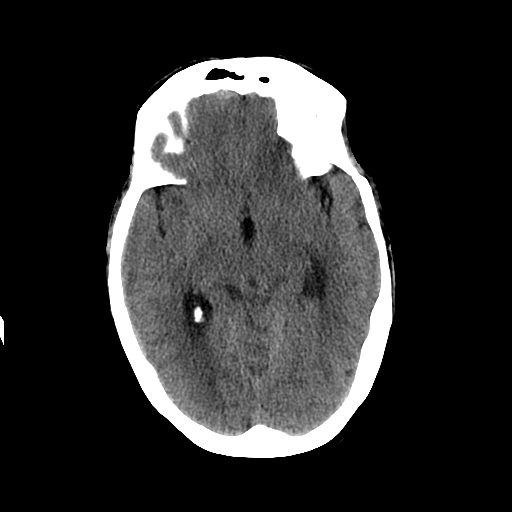
[im 18/32  brain]
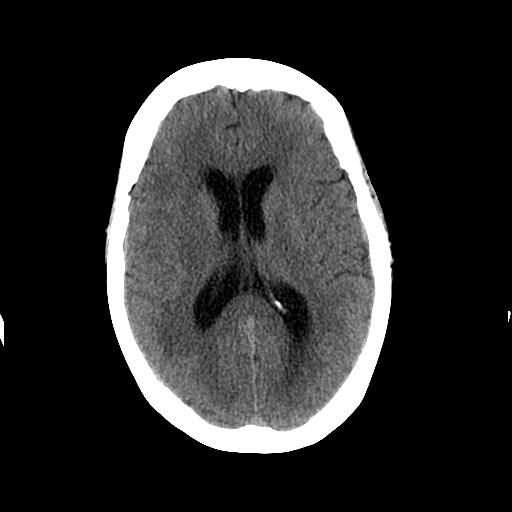
[im 18/32  bone]
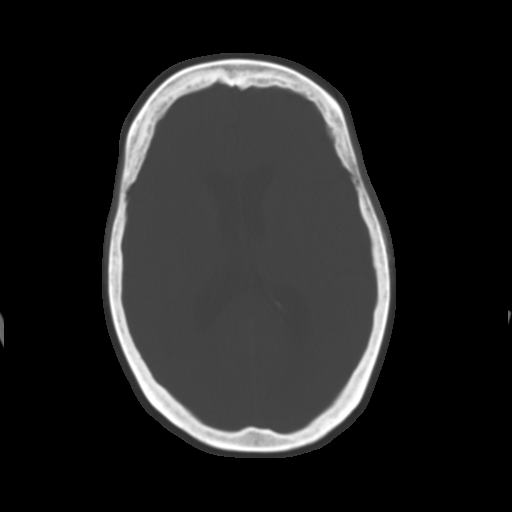
[im 21/32  brain]
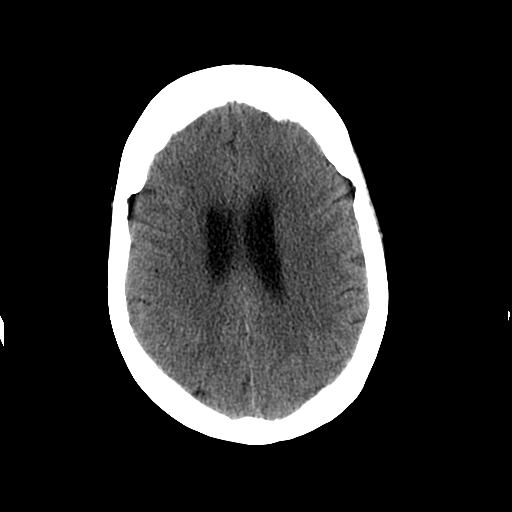
[im 25/32  brain]
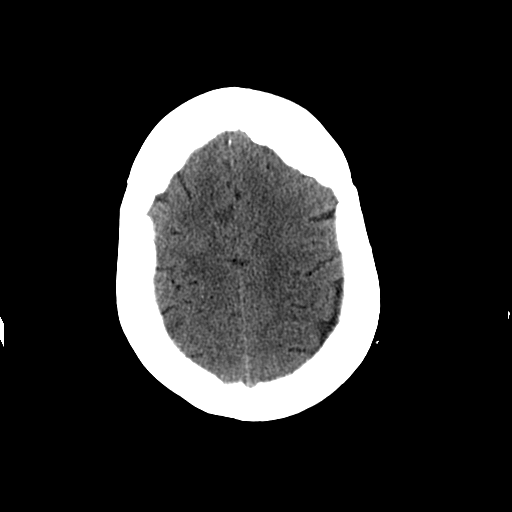
[im 28/32  brain]
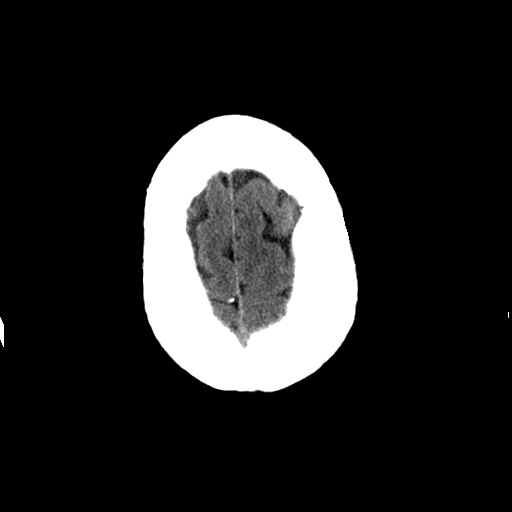

[Series 202: head w/o bone, idose (1) · axial · non-contrast · 0.49mm/px · z∈[+79,+204]mm · 8 of 64 slices shown]
[im 7/64  bone]
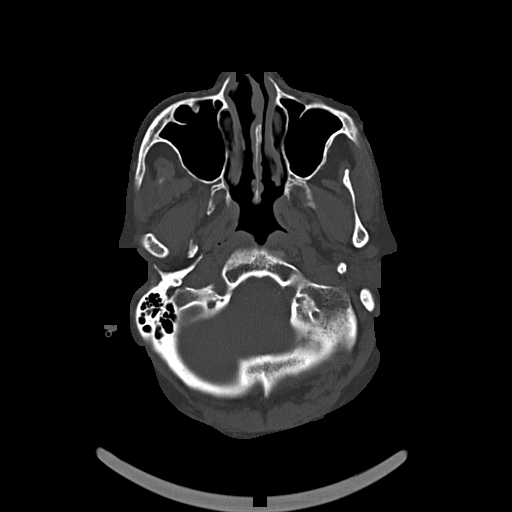
[im 14/64  bone]
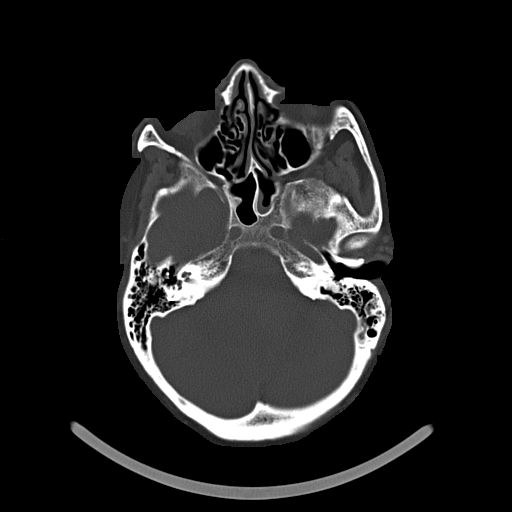
[im 20/64  bone]
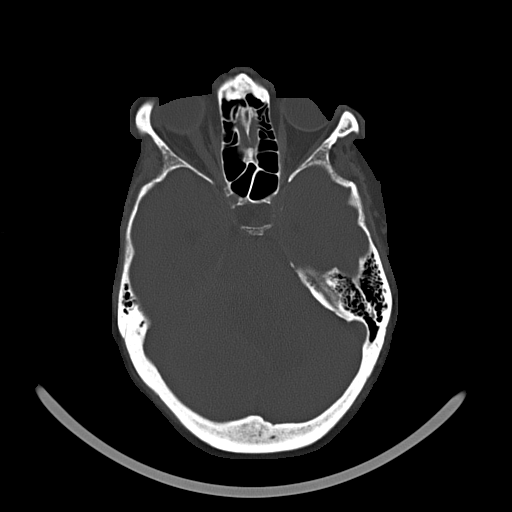
[im 27/64  bone]
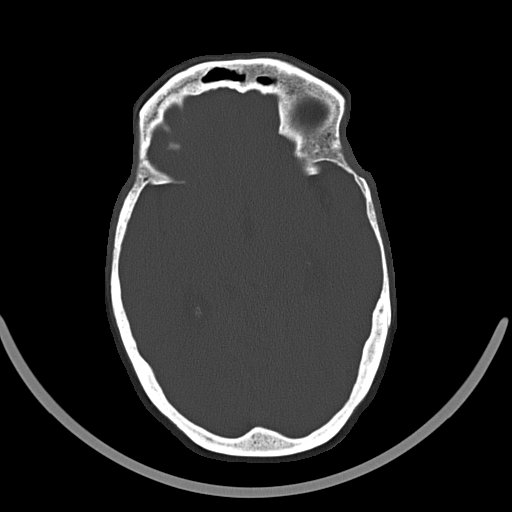
[im 37/64  bone]
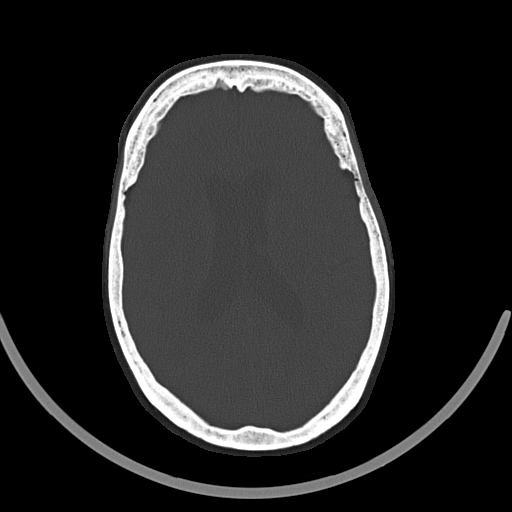
[im 44/64  bone]
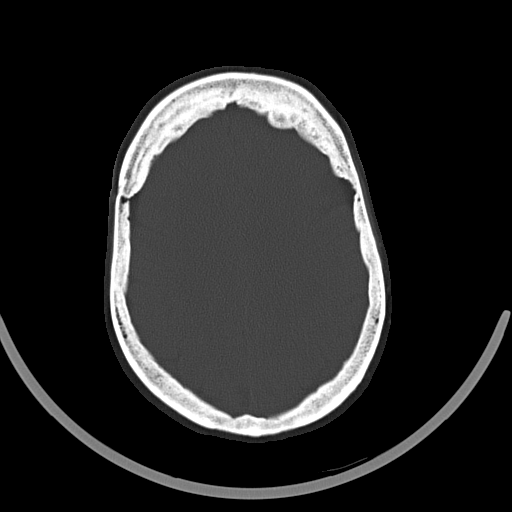
[im 50/64  bone]
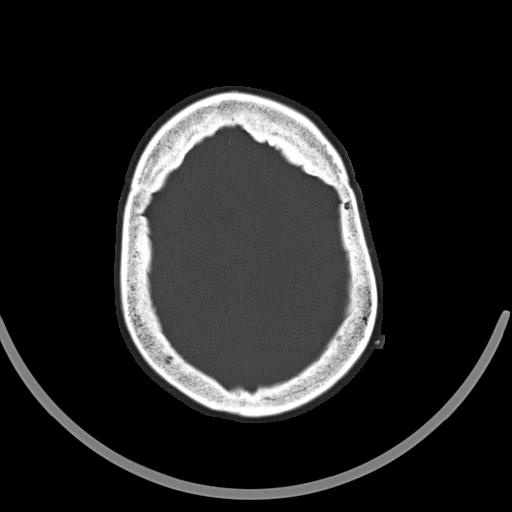
[im 57/64  bone]
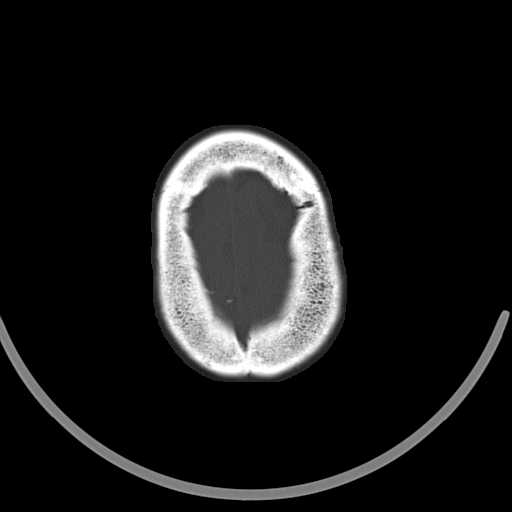

[16 of 30 positions shown; findings below may reference images not displayed]

FINDINGS: There is new vasogenic edema throughout the cerebellum adjacent to
an apparent 3.9 x 3.3 cm mass involving the right cerebellar
hemisphere posteriorly (image number 8). There is obliteration of
the subarachnoid spaces in the posterior fossa. The fourth ventricle
is effaced. There is some mass effect on the pons.

Compared with the prior examination, there is new mild dilatation of
the third and lateral ventricles. No definite supratentorial lesions
are identified. There is no midline shift or evidence acute
intracranial hemorrhage.

The visualized paranasal sinuses, mastoid air cells and middle ears
are clear. The calvarium is intact.
IMPRESSION: New mass lesion involving the right cerebellar hemisphere with
associated vasogenic edema, localized mass effect and early
obstructive hydrocephalus. Appearance is most consistent with
metastatic breast cancer. MRI within without contrast may be helpful
for further evaluation.

Critical Value/emergent results were called by telephone at the time
of interpretation on 02/20/2014 at [DATE] to Dr. Ivett, who
verbally acknowledged these results.

## 2016-02-20 IMAGING — MR MR HEAD WO/W CM
13 of 15 series · 35 of 48 positions shown · IV contrast (multihance)
Comparison: Head CT without contrast 02/20/2014. Head CT without
contrast 09/03/2005.

CLINICAL DATA: 62-year-old female with dizziness and visual
changes. History of breast cancer in 1747. Abnormal head CT. Initial
encounter.

EXAM:
MRI HEAD WITHOUT AND WITH CONTRAST
TECHNIQUE: Multiplanar, multiecho pulse sequences of the brain and surrounding
structures were obtained without and with intravenous contrast.
CONTRAST:  19mL MULTIHANCE GADOBENATE DIMEGLUMINE 529 MG/ML IV SOLN

[Series 3: T1 · sagittal · 5.0mm · 0.47mm/px · 2 of 23 slices shown]
[im 1/23]
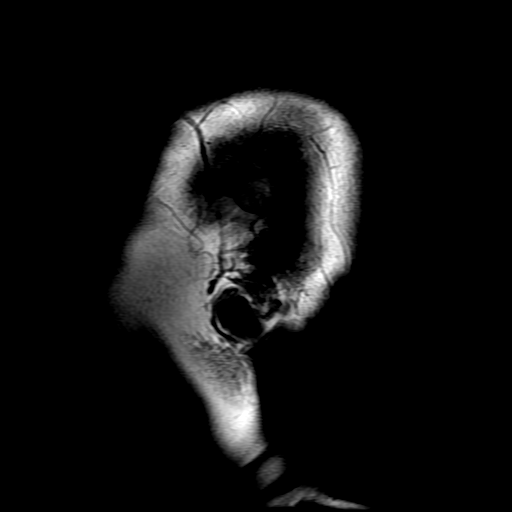
[im 23/23]
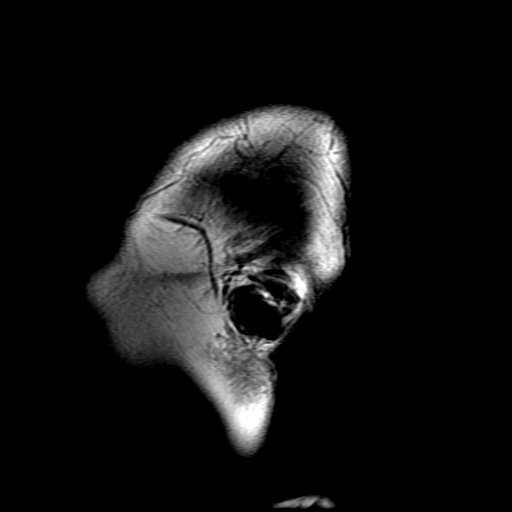

[Series 4: DWI · axial · 5.0mm · 1.09mm/px · z∈[-72,+72]mm · 5 of 60 slices shown (1 of 4)]
[im 1/60]
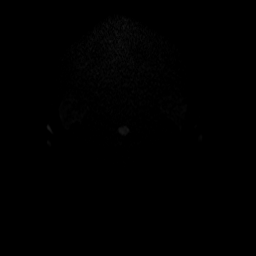
[im 15/60]
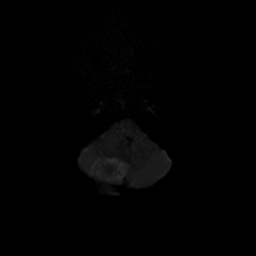
[im 30/60]
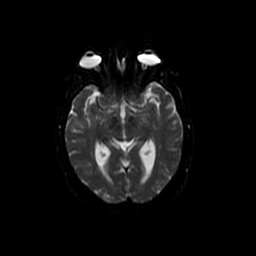
[im 45/60]
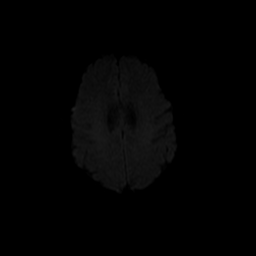
[im 60/60]
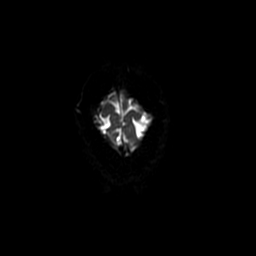

[Series 5: DWI · coronal · 5.0mm · 1.09mm/px · 6 of 66 slices shown (2 of 4)]
[im 1/66]
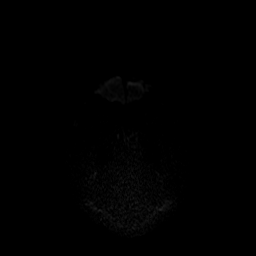
[im 14/66]
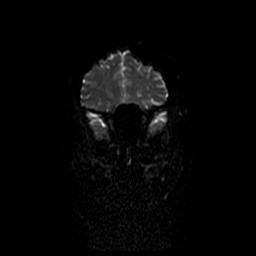
[im 27/66]
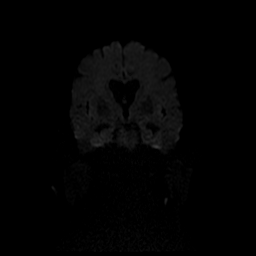
[im 40/66]
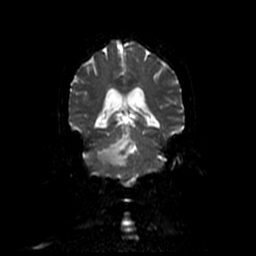
[im 53/66]
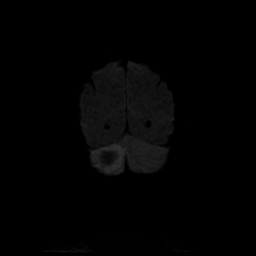
[im 66/66]
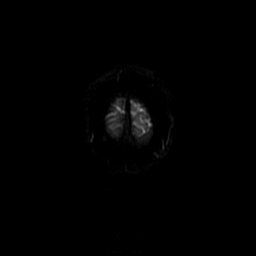

[Series 6: T2 · axial · 5.0mm · 0.82mm/px · z∈[-66,+77]mm · 2 of 25 slices shown (1 of 3)]
[im 1/25]
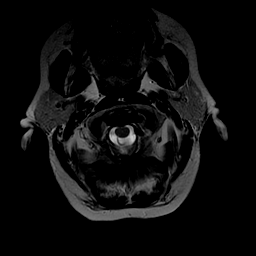
[im 25/25]
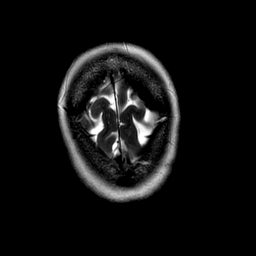

[Series 7: FLAIR · axial · 5.0mm · 0.41mm/px · z∈[-66,+77]mm · 2 of 25 slices shown]
[im 1/25]
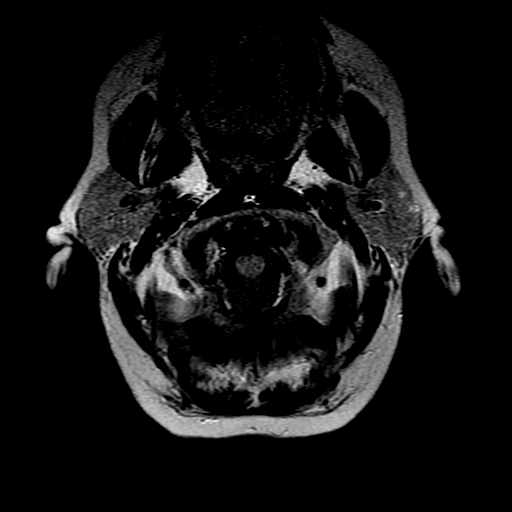
[im 25/25]
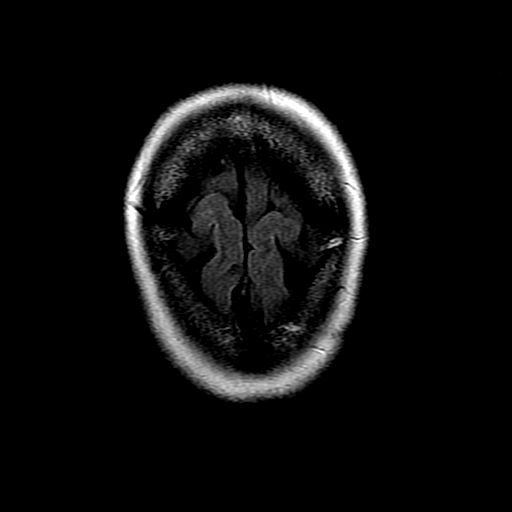

[Series 8: ax mpgr · axial · 5.0mm · 0.82mm/px · z∈[-66,+77]mm · 2 of 25 slices shown]
[im 1/25]
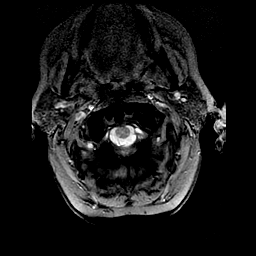
[im 25/25]
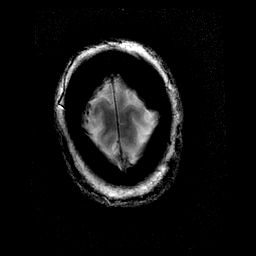

[Series 10: T2 post-contrast · coronal · 5.0mm · 0.39mm/px · 2 of 25 slices shown]
[im 1/25]
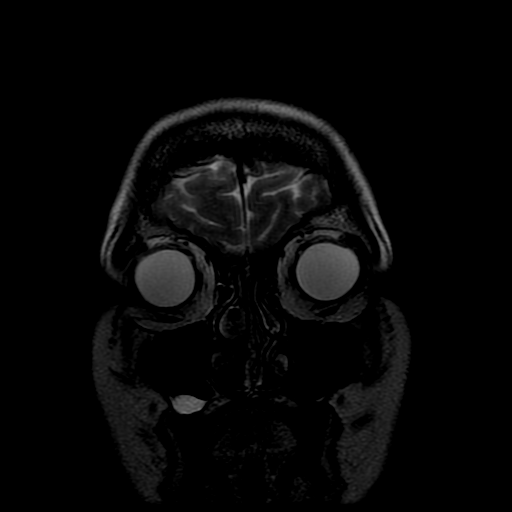
[im 25/25]
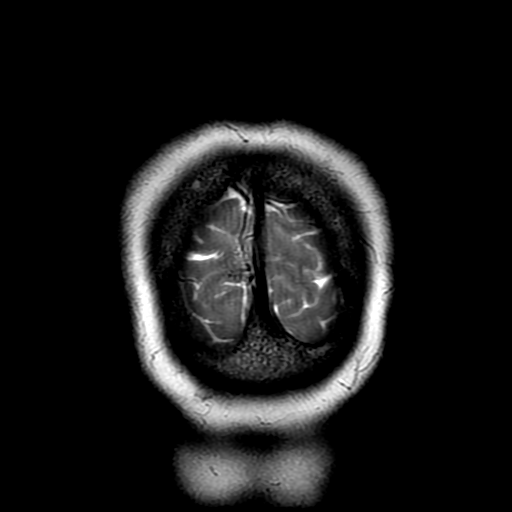

[Series 12: T1 post-contrast · coronal · 5.0mm · 0.78mm/px · 2 of 25 slices shown (1 of 2)]
[im 1/25]
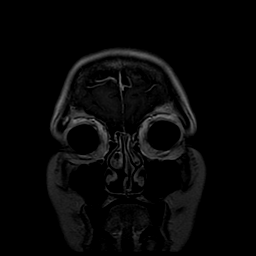
[im 25/25]
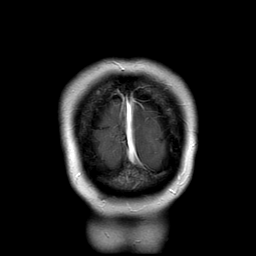

[Series 13: T2 · coronal · 3.0mm · 0.74mm/px · 2 of 28 slices shown (2 of 3)]
[im 1/28]
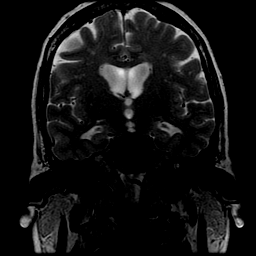
[im 28/28]
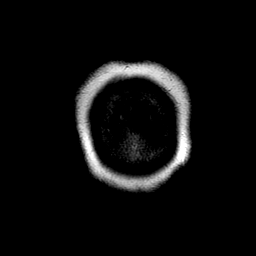

[Series 14: T2 · sagittal · 3.0mm · 0.86mm/px · 2 of 25 slices shown (3 of 3)]
[im 1/25]
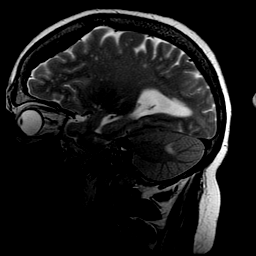
[im 25/25]
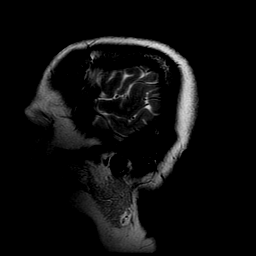

[Series 15: T1 post-contrast · sagittal · 5.0mm · 0.47mm/px · 2 of 23 slices shown (2 of 2)]
[im 1/23]
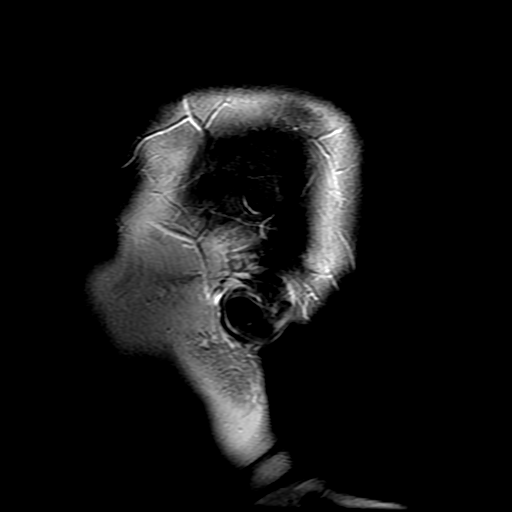
[im 23/23]
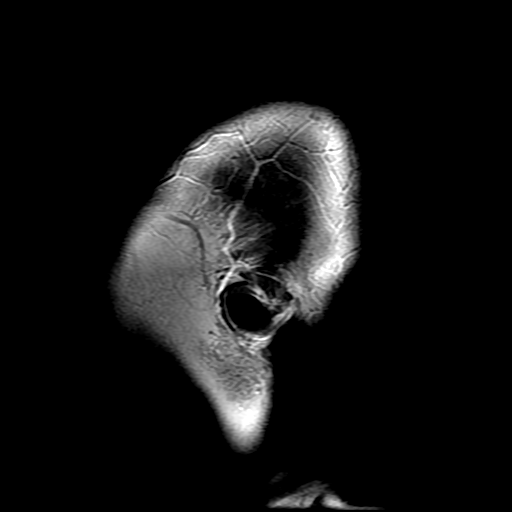

[Series 400: DWI · axial · 5.0mm · 1.09mm/px · z∈[-72,+72]mm · 3 of 30 slices shown (3 of 4)]
[im 1/30]
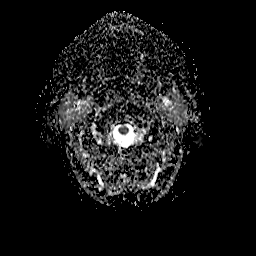
[im 15/30]
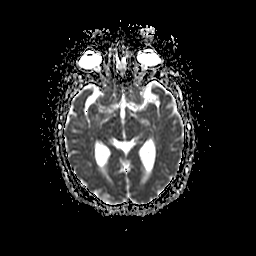
[im 30/30]
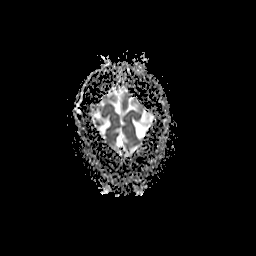

[Series 500: DWI · coronal · 5.0mm · 1.09mm/px · 3 of 33 slices shown (4 of 4)]
[im 1/33]
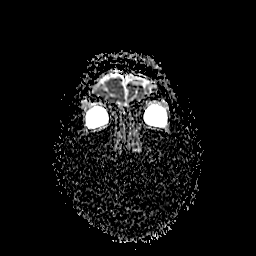
[im 17/33]
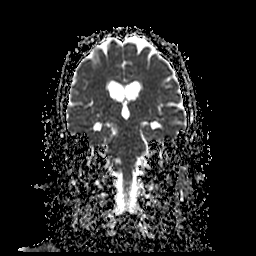
[im 33/33]
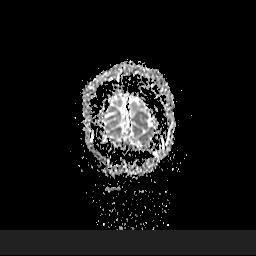

[35 of 48 positions shown; findings below may reference images not displayed]

FINDINGS: The posterior fossa lesion corresponds to a rounded mass with
generally solid enhancement throughout, located along the right
periphery of the posterior fossa and encompassing 38 x 47 x 36 mm
(AP by transverse by CC). Multiplanar T2 weighted imaging suggests a
subtle CSF cleft between the mass an the abnormal cerebellum (series
6, image 5, series 14, image 14). However, no definite dural tail is
identified and there is indistinct abnormal enhancement along the
margins of the mass. On precontrast images the bulk of the mass is
isointense to gray matter, a much of the mass shows restricted
diffusion (sparing the central aspect -series 400, image 7). No
associated blood products or mineralization on T2 * imaging.

No other intracranial mass or enhancing lesion identified in the
brain.

Severe posterior fossa mass effect, effacing the caudal aspect of
the fourth ventricle and resulting in mild cerebellar tonsillar
ectopia. T2 and FLAIR hyperintensity throughout the right cerebellar
hemisphere in extending to the vermis and slightly crossing midline
compatible with vasogenic edema. Partially effaced pre pontine and
pre medullary cisterns.

Temporal horns have enlarged compared to 1221, and there is mild
periventricular FLAIR hyperintensity in keeping with transependymal
edema.

No supratentorial mass effect. Major intracranial vascular flow
voids are preserved with dominant distal left vertebral artery
suspected. Scattered nonspecific cerebral white matter T2 and FLAIR
hyperintensity. Negative pituitary. Negative visualized cervical
spine. Visualized bone marrow signal is within normal limits. No
acute intracranial hemorrhage identified.

Visualized orbit soft tissues are within normal limits. Mild
paranasal sinus mucosal thickening and mastoid fluid. Negative
visualized nasopharynx. Visualized scalp soft tissues are within
normal limits.
IMPRESSION: 1. Solitary right posterior fossa enhancing mass, 38 x 47 x 36 mm.
Meningioma is favored over solitary metastasis.
2. Severe cerebellar edema and posterior fossa mass effect with a
degree of fourth ventricular obstruction and early ventriculomegaly.
Partially effaced basilar cisterns. Recommend neurosurgery consult.
The above discussed by telephone with Dr. Pei in the ED on
